# Patient Record
Sex: Female | Born: 1950 | Race: White | Hispanic: No | Marital: Married | Smoking: Former smoker
Health system: Southern US, Community
[De-identification: ages and names within clinical notes are randomized; demographics above are authoritative.]

## PROBLEM LIST (undated history)

## (undated) DIAGNOSIS — J9611 Chronic respiratory failure with hypoxia: Secondary | ICD-10-CM

## (undated) DIAGNOSIS — F29 Unspecified psychosis not due to a substance or known physiological condition: Secondary | ICD-10-CM

## (undated) DIAGNOSIS — E785 Hyperlipidemia, unspecified: Secondary | ICD-10-CM

## (undated) DIAGNOSIS — I10 Essential (primary) hypertension: Secondary | ICD-10-CM

## (undated) DIAGNOSIS — E119 Type 2 diabetes mellitus without complications: Secondary | ICD-10-CM

## (undated) DIAGNOSIS — J9612 Chronic respiratory failure with hypercapnia: Secondary | ICD-10-CM

## (undated) DIAGNOSIS — R9389 Abnormal findings on diagnostic imaging of other specified body structures: Secondary | ICD-10-CM

## (undated) DIAGNOSIS — K439 Ventral hernia without obstruction or gangrene: Secondary | ICD-10-CM

## (undated) DIAGNOSIS — I739 Peripheral vascular disease, unspecified: Secondary | ICD-10-CM

## (undated) DIAGNOSIS — F329 Major depressive disorder, single episode, unspecified: Secondary | ICD-10-CM

## (undated) DIAGNOSIS — F32A Depression, unspecified: Secondary | ICD-10-CM

## (undated) DIAGNOSIS — E662 Morbid (severe) obesity with alveolar hypoventilation: Secondary | ICD-10-CM

## (undated) DIAGNOSIS — I4891 Unspecified atrial fibrillation: Secondary | ICD-10-CM

## (undated) HISTORY — PX: DILATION AND CURETTAGE OF UTERUS: SHX78

## (undated) HISTORY — PX: COLON SURGERY: SHX602

## (undated) HISTORY — DX: Abnormal findings on diagnostic imaging of other specified body structures: R93.89

## (undated) HISTORY — PX: TIBIA FRACTURE SURGERY: SHX806

## (undated) HISTORY — PX: HERNIA REPAIR: SHX51

---

## 2004-06-15 ENCOUNTER — Other Ambulatory Visit: Payer: Self-pay

## 2006-05-18 ENCOUNTER — Inpatient Hospital Stay: Payer: Self-pay | Admitting: Unknown Physician Specialty

## 2006-07-21 ENCOUNTER — Other Ambulatory Visit: Payer: Self-pay

## 2006-07-21 ENCOUNTER — Inpatient Hospital Stay: Payer: Self-pay | Admitting: Internal Medicine

## 2006-07-23 ENCOUNTER — Inpatient Hospital Stay: Payer: Self-pay | Admitting: Unknown Physician Specialty

## 2010-07-19 ENCOUNTER — Inpatient Hospital Stay: Payer: Self-pay | Admitting: Internal Medicine

## 2010-09-15 ENCOUNTER — Other Ambulatory Visit: Payer: Self-pay | Admitting: Family Medicine

## 2010-09-16 ENCOUNTER — Inpatient Hospital Stay: Payer: Self-pay | Admitting: Internal Medicine

## 2011-04-03 ENCOUNTER — Encounter: Payer: Self-pay | Admitting: Cardiothoracic Surgery

## 2011-04-03 ENCOUNTER — Encounter: Payer: Self-pay | Admitting: Nurse Practitioner

## 2011-04-07 ENCOUNTER — Encounter: Payer: Self-pay | Admitting: Cardiothoracic Surgery

## 2011-04-07 ENCOUNTER — Encounter: Payer: Self-pay | Admitting: Nurse Practitioner

## 2011-05-08 ENCOUNTER — Encounter: Payer: Self-pay | Admitting: Nurse Practitioner

## 2011-05-08 ENCOUNTER — Encounter: Payer: Self-pay | Admitting: Cardiothoracic Surgery

## 2011-06-08 ENCOUNTER — Encounter: Payer: Self-pay | Admitting: Nurse Practitioner

## 2011-06-08 ENCOUNTER — Encounter: Payer: Self-pay | Admitting: Cardiothoracic Surgery

## 2011-07-08 ENCOUNTER — Encounter: Payer: Self-pay | Admitting: Nurse Practitioner

## 2011-07-08 ENCOUNTER — Encounter: Payer: Self-pay | Admitting: Cardiothoracic Surgery

## 2011-08-08 ENCOUNTER — Encounter: Payer: Self-pay | Admitting: Nurse Practitioner

## 2011-08-08 ENCOUNTER — Encounter: Payer: Self-pay | Admitting: Cardiothoracic Surgery

## 2011-09-07 ENCOUNTER — Encounter: Payer: Self-pay | Admitting: Cardiothoracic Surgery

## 2011-09-07 ENCOUNTER — Encounter: Payer: Self-pay | Admitting: Nurse Practitioner

## 2011-10-08 ENCOUNTER — Encounter: Payer: Self-pay | Admitting: Cardiothoracic Surgery

## 2011-10-08 ENCOUNTER — Encounter: Payer: Self-pay | Admitting: Nurse Practitioner

## 2011-11-08 ENCOUNTER — Encounter: Payer: Self-pay | Admitting: Nurse Practitioner

## 2011-11-08 ENCOUNTER — Encounter: Payer: Self-pay | Admitting: Cardiothoracic Surgery

## 2011-11-18 LAB — WOUND CULTURE

## 2011-12-06 ENCOUNTER — Encounter: Payer: Self-pay | Admitting: Nurse Practitioner

## 2011-12-06 ENCOUNTER — Encounter: Payer: Self-pay | Admitting: Cardiothoracic Surgery

## 2012-01-03 ENCOUNTER — Other Ambulatory Visit: Payer: Self-pay | Admitting: Nurse Practitioner

## 2012-01-03 LAB — CBC WITH DIFFERENTIAL/PLATELET
Basophil #: 0 10*3/uL (ref 0.0–0.1)
Eosinophil #: 0.2 10*3/uL (ref 0.0–0.7)
Eosinophil %: 2.4 %
HCT: 39.2 % (ref 35.0–47.0)
HGB: 13 g/dL (ref 12.0–16.0)
Lymphocyte %: 16 %
MCH: 30 pg (ref 26.0–34.0)
MCHC: 33.2 g/dL (ref 32.0–36.0)
MCV: 90 fL (ref 80–100)
Monocyte #: 0.6 10*3/uL (ref 0.0–0.7)
Neutrophil #: 7 10*3/uL — ABNORMAL HIGH (ref 1.4–6.5)
Platelet: 192 10*3/uL (ref 150–440)
RBC: 4.34 10*6/uL (ref 3.80–5.20)
RDW: 14.5 % (ref 11.5–14.5)
WBC: 9.4 10*3/uL (ref 3.6–11.0)

## 2012-01-03 LAB — HEMOGLOBIN A1C: Hemoglobin A1C: 7.8 % — ABNORMAL HIGH (ref 4.2–6.3)

## 2012-01-06 ENCOUNTER — Encounter: Payer: Self-pay | Admitting: Nurse Practitioner

## 2012-01-06 ENCOUNTER — Encounter: Payer: Self-pay | Admitting: Cardiothoracic Surgery

## 2012-01-07 LAB — WOUND CULTURE

## 2013-05-05 ENCOUNTER — Inpatient Hospital Stay: Payer: Self-pay | Admitting: Internal Medicine

## 2013-05-05 LAB — COMPREHENSIVE METABOLIC PANEL
Albumin: 3.1 g/dL — ABNORMAL LOW (ref 3.4–5.0)
Alkaline Phosphatase: 119 U/L (ref 50–136)
Anion Gap: 5 — ABNORMAL LOW (ref 7–16)
BUN: 13 mg/dL (ref 7–18)
Bilirubin,Total: 0.3 mg/dL (ref 0.2–1.0)
Calcium, Total: 9.1 mg/dL (ref 8.5–10.1)
Chloride: 103 mmol/L (ref 98–107)
Co2: 29 mmol/L (ref 21–32)
Creatinine: 1.05 mg/dL (ref 0.60–1.30)
EGFR (African American): >60
EGFR (Non-African Amer.): 57 — ABNORMAL LOW
Glucose: 254 mg/dL — ABNORMAL HIGH (ref 65–99)
Osmolality: 283 (ref 275–301)
Potassium: 4.1 mmol/L (ref 3.5–5.1)
SGOT(AST): 18 U/L (ref 15–37)
SGPT (ALT): 23 U/L (ref 12–78)
Sodium: 137 mmol/L (ref 136–145)
Total Protein: 7 g/dL (ref 6.4–8.2)

## 2013-05-05 LAB — CBC
HCT: 39.1 % (ref 35.0–47.0)
HGB: 13.5 g/dL (ref 12.0–16.0)
MCH: 31.2 pg (ref 26.0–34.0)
MCHC: 34.4 g/dL (ref 32.0–36.0)
MCV: 91 fL (ref 80–100)
Platelet: 184 10*3/uL (ref 150–440)
RBC: 4.31 10*6/uL (ref 3.80–5.20)
RDW: 14.2 % (ref 11.5–14.5)
WBC: 9.5 10*3/uL (ref 3.6–11.0)

## 2013-05-07 LAB — CBC WITH DIFFERENTIAL/PLATELET
Basophil #: 0.1 10*3/uL (ref 0.0–0.1)
Basophil %: 0.6 %
Eosinophil %: 5.8 %
HCT: 41.3 % (ref 35.0–47.0)
HGB: 14.3 g/dL (ref 12.0–16.0)
Lymphocyte %: 17.7 %
MCH: 31.3 pg (ref 26.0–34.0)
MCV: 91 fL (ref 80–100)
Monocyte %: 6 %
Neutrophil #: 7.5 10*3/uL — ABNORMAL HIGH (ref 1.4–6.5)
Neutrophil %: 69.9 %
Platelet: 189 10*3/uL (ref 150–440)
RBC: 4.56 10*6/uL (ref 3.80–5.20)
WBC: 10.8 10*3/uL (ref 3.6–11.0)

## 2013-05-07 LAB — BASIC METABOLIC PANEL
Calcium, Total: 9.6 mg/dL (ref 8.5–10.1)
Chloride: 103 mmol/L (ref 98–107)
Creatinine: 1.02 mg/dL (ref 0.60–1.30)
EGFR (African American): >60
Glucose: 163 mg/dL — ABNORMAL HIGH (ref 65–99)
Osmolality: 277 (ref 275–301)
Potassium: 4.2 mmol/L (ref 3.5–5.1)

## 2013-05-07 LAB — SEDIMENTATION RATE: Erythrocyte Sed Rate: 28 mm/hr (ref 0–30)

## 2013-05-08 LAB — BASIC METABOLIC PANEL
BUN: 13 mg/dL (ref 7–18)
Chloride: 102 mmol/L (ref 98–107)
Creatinine: 1.07 mg/dL (ref 0.60–1.30)
Glucose: 198 mg/dL — ABNORMAL HIGH (ref 65–99)
Potassium: 4.4 mmol/L (ref 3.5–5.1)
Sodium: 138 mmol/L (ref 136–145)

## 2013-12-14 ENCOUNTER — Ambulatory Visit: Payer: Self-pay | Admitting: Internal Medicine

## 2014-01-03 ENCOUNTER — Ambulatory Visit: Payer: Self-pay | Admitting: Internal Medicine

## 2014-07-08 ENCOUNTER — Ambulatory Visit: Payer: Self-pay | Admitting: Internal Medicine

## 2014-11-15 ENCOUNTER — Ambulatory Visit: Payer: Self-pay | Admitting: Obstetrics and Gynecology

## 2014-11-25 ENCOUNTER — Ambulatory Visit: Payer: Self-pay | Admitting: Obstetrics and Gynecology

## 2015-01-27 NOTE — Consult Note (Signed)
Brief Consult Note: Diagnosis: Infection left thumb.   Patient was seen by consultant.   Recommend further assessment or treatment.   Orders entered.   Comments: 64 year old diabetic female cut tip of left thumb a week ago and developed reddness and swelling of the thumb.  Admitted 05/05/13 for cellulitis of the thumb and hand.  Started on Unisyn and Vancomycin.  No fever or chills.  white blood count normal.  Better today after 24 hrs of antibiotics.    Exam:  No warmth or reddness of significance.  Swelling and bruising present but not much pain.  Slight numbness distally.  IP joint flexion and extension normal.  Vesicular changes on several fingers and in 1st web.  No hand or wrist reddness.   Imp:  resolving cellulitis left thumb.    Rx:  continue antibiotics         warm saline soaks q6h        no indication for surgery at this time        large blister decompressed 1st web space.  Electronic Signatures: Valinda HoarMiller, Beverlyn Mcginness E (MD)  (Signed 31-Jul-14 23:14)  Authored: Brief Consult Note   Last Updated: 31-Jul-14 23:14 by Valinda HoarMiller, Mehki Klumpp E (MD)

## 2015-01-27 NOTE — Consult Note (Signed)
PATIENT NAME:  Jasmine Osborn, OMAR MR#:  [id number] DATE OF BIRTH:  1951/08/14  INFECTIOUS DISEASE CONSULTATION REPORT  DATE OF CONSULTATION:  05/06/2013  REFERRING PHYSICIAN:  Einar Crow, MD  CONSULTING PHYSICIAN:  Stann Mainland. Sampson Goon, MD  REASON FOR CONSULTATION: Left hand swelling and redness.  HISTORY OF PRESENT ILLNESS: A 64 year old female with a history of diabetes,  controlled, as well as morbid obesity. She reports that several weeks ago, she cut her thumb when cutting turnips. She states it was not doing too bad until about one week ago when it started to get swollen and red. She then developed blisters on her hand. She has had no fever or chills with this. She did not have any contact with animals or any gardening or other exposures. She has never had an infection like this before. She denies any recent cold sores or herpes outbreaks.   The patient was started on vancomycin on admission and is now also on Unasyn. She says she has had some slight improvement since admission.   PAST MEDICAL HISTORY:  1.  Morbid obesity.  2.  Diabetes.  3.  Hypertension.  4.  Hepatitis C.  5.  Cholelithiasis.  6.  Schizophrenia.   PAST SURGICAL HISTORY: Ventral hernia repair.   SOCIAL HISTORY: The patient does not smoke. She lives with her husband. No alcohol or drug abuse. She has not gardened, had any exposure to animals or travel recently.   FAMILY HISTORY: Her mother had hypertension and Alzheimer's. Father had a history of heart problems.   CURRENT MEDICATIONS: The patient has been started on vancomycin and Unasyn.   OUTPATIENT MEDICATIONS: Include Risperdal, Remeron, metoprolol, iron, amlodipine.   ALLERGIES: No known drug allergies.   PHYSICAL EXAMINATION:  GENERAL: Morbidly obese, sitting in a chair.  VITAL SIGNS: Temperature since admission 98.3. Pulse is 98. Blood pressure 120/70, saturations 97% on room air.  HEENT: Pupils equal, round, and reactive to light and  accommodation. Extraocular movements are intact. Sclerae are anicteric. Oropharynx clear.  NECK: Supple.  HEART: Regular.  LUNGS: Clear to auscultation.  ABDOMEN: Morbidly obese.  EXTREMITIES: On her left thumb, she has marked swelling as well as a dry, dark, thickened skin over her anterior thumb. The rest of her hand is quite swollen and mildly red. She has bullae over her fingers which are also swollen on the left hand. There is no streaking. There are no nodules up the arm and no lymphadenopathy noted.   DATA: On admission, the patient's white blood count is 9.5, hemoglobin 13.5, platelets 184. Basic metabolic panel and comprehensive panel and LFTs are normal except for sugar is elevated at 254. Creatinine is 1.05. No imaging is available.   IMPRESSION: A 64 year old with morbid obesity, hypertension, diabetes and hepatitis C with cellulitis over her left thumb. This is somewhat atypical appearing. She does have vesicles which does raise the possibility of herpetic whitlow, although she has no exposure history to correlate with it. She seems to be relatively stable with the vancomycin, Unasyn, and the acyclovir she is on.   RECOMMENDATIONS:  1.  I have ordered a herpetic swab of the lesion to check for herpes.   2.  Continue current antibiotics.  3.  Recommended elevation of the arm to help with the swelling. At this point, I do not think surgical intervention is needed; however, if it worsens, I would certainly consult surgery.  4.  There is a possibility of an usual vasculitis with her hepatitis C history; for  now, though, will continue on her current antibiotics.  5.  Thank you for the consult. I will be glad to follow with you    ____________________________ Stann Mainlandavid P. Sampson GoonFitzgerald, MD dpf:np D: 05/06/2013 16:39:03 ET T: 05/06/2013 17:52:11 ET JOB#: [id number]  cc: Stann Mainlandavid P. Sampson GoonFitzgerald, MD, <Dictator> Ivory Maduro Sampson GoonFITZGERALD MD ELECTRONICALLY SIGNED 05/08/2013 20:37

## 2015-01-27 NOTE — H&P (Signed)
PATIENT NAME:  Jasmine Osborn, [name] MR#:  [id number] DATE OF BIRTH:  12/08/1950  DATE OF ADMISSION:  05/05/2013  PRIMARY CARE PHYSICIAN:  Dr. Einar CrowMarshall Anderson.   REFERRING PHYSICIAN:  Dr. Bayard Malesandolph Brown.   CHIEF COMPLAINT:  Left hand swelling and redness.   HISTORY OF PRESENT ILLNESS:  Jasmine Osborn is a 64 year old Caucasian female with a history of diabetes mellitus type 2 and morbid obesity.  She was in her usual state of health until about a week or two weeks while she was cutting vegetables she accidentally cut her left hand.  Since that time she developed redness which got worse with time, associated with swelling of the left hand.  Finally, she decided to come to the Emergency Department today.  The patient is being now admitted to the hospital with left hand cellulitis.  Evaluation at the Emergency Department was also concerned about some vesicles over the left hand fingers, the second, third and fourth raising question whether there is herpetic lesions affecting the hand as well.   REVIEW OF SYSTEMS:  CONSTITUTIONAL:  Denies any fever.  No chills.  No fatigue.  EYES:  No blurring of vision.  No double vision.  EARS, NOSE, THROAT:  No hearing impairment.  No sore throat.  No dysphagia.  CARDIOVASCULAR:  No chest pain.  No shortness of breath.  No syncope.  RESPIRATORY:  No cough.  No shortness of breath.  No chest pain.  GASTROINTESTINAL:  No abdominal pain, no vomiting.  No diarrhea.  GENITOURINARY:  No dysuria.  No frequency of urination.  MUSCULOSKELETAL:  No joint swelling.  No clubbing.  INTEGUMENTARY:  Apart from the left hand swelling and redness, there is no skin rash elsewhere.  No ulcers.  NEUROLOGY:  No focal weakness.  No seizure activity.  No headache.  PSYCHIATRY:  No anxiety or depression, but she has history of schizophrenia.  ENDOCRINE:  No polyuria or polydipsia.  No heat or cold intolerance.   PAST MEDICAL HISTORY:  Diabetes mellitus type 2 uncontrolled, systemic  hypertension, morbid obesity, history of hepatitis C virus infection, cholelithiasis, schizophrenia.   PAST SURGICAL HISTORY:  Ventral hernia repair with using synthetic mesh, however this had failed and patient developed another hernia, now it is inoperable.   SOCIAL HABITS:  Nonsmoker.  No history of alcohol or drug abuse.   SOCIAL HISTORY:  She is single, unemployed.   FAMILY HISTORY:  Her mother suffered from hypertension and Alzheimer dementia.  She is deceased now.  Her father also deceased, but prior to his death he suffered from some kind of heart problems that she could not specify.    ADMISSION MEDICATIONS:  Risperdal 2 mg once a day, multivitamin once a day, mirtazapine 30 mg once a day, metoprolol succinate 50 mg once a day, ferrous sulfate 325 mg twice a day, amlodipine 5 mg a day.   ALLERGIES:  No known drug allergies.   PHYSICAL EXAMINATION: VITAL SIGNS:  Blood pressure 166/91, respiratory rate 22, pulse 100, temperature 98.3.  Oxygen saturation 94% on room air.  GENERAL APPEARANCE:  Elderly female, morbidly obese, lying in bed in no acute distress.  HEAD AND NECK:  No pallor.  No icterus.  No cyanosis.  Ear examination revealed normal hearing, no discharge, no lesions.  Examination of the nose showed no lesions, no ulcers, no discharge.  Oropharyngeal examination revealed normal lips and tongue, no oral thrush, no ulcers.  Eye examination revealed normal eyelids and conjunctivae.  Pupils about 5 mm, round,  equal and reactive to light.  NECK:  Supple.  Trachea at midline.  No thyromegaly.  No cervical lymphadenopathy.  No masses.  HEART:  Revealed normal S1, S2.  No S3, S4.  No murmur.  No gallop.  No carotid bruits.  RESPIRATORY:  Revealed normal breathing pattern without use of accessory muscles.  No rales.  No wheezing.  ABDOMEN:  Morbidly obese, soft without tenderness.  No hepatosplenomegaly.  No masses.  She has a protruding reducible ventral hernia at the lower mid abdomen.   Nontender.  SKIN:  Revealed redness and discoloration of the left hand finger along with mild erythema.  There is also mild erythema over the palm of the left hand as well associated with soft tissue swelling.  There are multiple vesicles affecting the second, third and fourth fingers.  MUSCULOSKELETAL:  No joint swelling.  No clubbing.  NEUROLOGIC:  Cranial nerves II through XII are intact.  No focal motor deficit.  PSYCHIATRY:  The patient is alert and oriented x 3.  Mood and affect were normal.   LABORATORY FINDINGS:  Her serum glucose was 254, BUN 13, creatinine 1, sodium 137, potassium 4.1.  Normal liver function tests and liver transaminases.  Albumin was slightly low at 3.1.  CBC showed white count of 9000, hemoglobin 13, hematocrit 39, platelet count 184.   ASSESSMENT: 1.  Left hand cellulitis.  2.  Vesicles on the left hand fingers, question whether this is herpetic lesions versus contact dermatitis.  3.  Diabetes mellitus, type 2, uncontrolled.  4.  Systemic hypertension.  5.  Morbid obesity.  6.  Schizophrenia.  7.  History of cholelithiasis and hepatitis C virus infection.   PLAN:  The patient was started on intravenous vancomycin.  I will continue the same.  Oral acyclovir or Zovirax started, 800 mg 5 times a day.  I will check serum for herpes simplex type I and type II, IgG and IgM.  If this is negative, then acyclovir has to be discontinued.  I will give the patient a booster dose for tetanus.  She does not remember the last time she had tetanus, but it is many years ago.  We will place her on Accu-Chek and sliding scale to control blood sugar.  I will start her on metformin 500 mg a day.  For deep vein thrombosis prophylaxis, we will place her on subcutaneous Lovenox 40 mg a day.   Time spent in evaluating this patient took more than 55 minutes.     ____________________________ Carney Corners. Rudene Re, MD amd:ea D: 05/06/2013 00:12:49 ET T: 05/06/2013 00:50:12  ET JOB#: [id number]  cc: Carney Corners. Rudene Re, MD, <Dictator> Zollie Scale MD ELECTRONICALLY SIGNED 05/06/2013 5:42

## 2015-01-27 NOTE — Discharge Summary (Signed)
PATIENT NAME:  Jasmine Osborn, Jasmine Osborn MR#:  [id number] DATE OF BIRTH:  10-10-1950  DATE OF ADMISSION:  05/05/2013 DATE OF DISCHARGE:  05/08/2013  FINAL DIAGNOSES:  1. Left hand cellulitis.  2. Diabetes mellitus, uncontrolled.  3. Hypertension.  4. Schizophrenia.  5. Morbid obesity.  6. History of hepatitis C viral infection.   HISTORY AND PHYSICAL: Please see dictated admission history and physical.   HOSPITAL COURSE: The patient was admitted with left hand swelling and pain, with evidence of cellulitis. There was a blistering rash on the hand as well which initially was concerning for herpes simplex versus zoster. She had been placed on isolation, was initially started on vancomycin, Zosyn and Zovirax. Infectious disease saw the patient. Viral culture was obtained from the blisters, which was negative for herpes simplex. The rash improved and appeared to be more drying up rather than fading away and did not appear to be vesicular on further evaluation. The erythema slowly improved. Her antibiotic regimen was changed over to Augmentin and doxycycline, and she was observed for another 24 hours. She remained afebrile on this, without evidence of worsening infection and felt well. At this time, she will be discharged to home in stable condition with her physical activity to be up as tolerated. Her diet will be 2 grams sodium, 1800 calorie ADA. She should check blood sugars daily and record this. We will anticipate her following up with Dr. Sampson GoonFitzgerald in 1 to 2 weeks to re-evaluate the hand and then follow up with Dr. Dareen PianoAnderson as previously scheduled.   DISCHARGE MEDICATIONS:  1. Mirtazapine 30 mg p.Osborn. at bedtime. 2. Multivitamin 1 p.Osborn. daily.  3. Risperdal 2 mg p.Osborn. at bedtime.  4. Amlodipine 5 mg p.Osborn. daily.  5. Toprol-XL 50 mg p.Osborn. daily.  6. Metformin 500 mg p.Osborn. b.i.d.  7. Augmentin 875 mg p.Osborn. b.i.d. for 10 days.  8. Doxycycline 100 mg p.Osborn. b.i.d. for 10 days.   At this time, she may stop  iron as her hemoglobin levels have returned to normal from previous anemia.   ____________________________ Lynnea FerrierBert J. Klein III, MD bjk:OSi D: 05/08/2013 12:27:35 ET T: 05/08/2013 14:16:52 ET JOB#: [id number]  cc: Lynnea FerrierBert J. Klein III, MD, <Dictator> Marya AmslerMarshall W. Dareen PianoAnderson, MD Stann Mainlandavid P. Sampson GoonFitzgerald, MD Daniel NonesBERT KLEIN MD ELECTRONICALLY SIGNED 05/10/2013 7:54

## 2015-01-30 LAB — SURGICAL PATHOLOGY

## 2015-02-05 NOTE — Op Note (Signed)
PATIENT NAME:  Jasmine Osborn, Jasmine Osborn MR#:  [id number] DATE OF BIRTH:  1951-02-02  DATE OF PROCEDURE:  11/25/2014  PREOPERATIVE DIAGNOSES: 1.  Postmenopausal bleeding.  2.  Endometrial hyperplasia (simple, non-atypical).   POSTOPERATIVE DIAGNOSES:  1.  Postmenopausal bleeding.  2.  Endometrial hyperplasia (simple, non-atypical).   PROCEDURES PERFORMED: 1.  Dilation and curettage.  2.  Placement of Mirena IUD.   SURGEON: Jolinda Croakavid Caralynn Gelber, MD  ASSISTANT: None.  TYPE OF ANESTHESIA: General endotracheal.  ESTIMATED BLOOD LOSS: Minimal.   URINE OUTPUT: 100 mL at the start of the case.   IV FLUID: Please see anesthesia record.   FINDINGS: An 8 cm uterus identified with uterine sound. Ms. Jasmine Osborn had a very difficult cervix to visualize due to body habitus as well as vaginal laxity. In the office, the cervix had been visualized with a long Graves speculum. Multiple other methods were used in the operating room using the equipment available, but adequate cervical access was not obtained. Once a long Graves speculum was identified then the cervix was able to be visualized. Would recommend ensuring access to an adequate speculum prior to any future procedures.   DESCRIPTION OF PROCEDURE: After obtaining appropriate consent, the patient was taken to the operating room where she was placed under general anesthesia. She was then prepped and draped in the normal sterile fashion, in the dorsal lithotomy position, in candy cane stirrups. A timeout taken to ensure the correct patient and the correct procedure and that prophylactic antibiotics were not necessary and her allergies were identified.   The cervix was eventually identified using a long Graves speculum and grasped using a single-tooth tenaculum. The uterine sound was placed in the uterus, measured 8 cm on sounding. The cervix was sequentially dilated up to 12-French diameter with Shawnie PonsPratt dilators. Sharp curettage was then performed with an adequate  sample sent for analysis. At this point, given approximately 45 minutes of difficulty trying to access the cervix with the proper retractors, it was not felt that an adequate angle could be achieved to perform hysteroscopy in a safe manner.   A Mirena IUD was placed in the routine fashion without complication. The lot number is [id number]. The strings were cut to approximately 3 cm in length. All instruments were removed from the vagina. Vaginal Premarin cream was placed around the introitus and lower vagina to help with pain from abrasions from the multiple manipulations.   The procedure was terminated. Ms. Jasmine Osborn was awakened from anesthesia without complication and transferred to the PACU. She will be discharged home with instructions and prescriptions already provided. Plan for 2 week follow up.   ____________________________ Teena Iraniavid M. Derrill KayGoodman, MD :Terrance Masssb D: 11/25/2014 13:01:04 ET T: 11/25/2014 13:53:16 ET JOB#: [id number]  cc: Teena Iraniavid M. Derrill KayGoodman, MD, <Dictator> Ula Couvillon Farrel ConnersM Lakenya Riendeau MD ELECTRONICALLY SIGNED 11/30/2014 20:20

## 2015-10-18 ENCOUNTER — Other Ambulatory Visit
Admission: RE | Admit: 2015-10-18 | Discharge: 2015-10-18 | Disposition: A | Discharge status: Home / Self Care | Payer: Managed Care, Other (non HMO) | Source: Ambulatory Visit | Attending: Podiatry | Admitting: Podiatry

## 2015-10-18 DIAGNOSIS — L97522 Non-pressure chronic ulcer of other part of left foot with fat layer exposed: Secondary | ICD-10-CM | POA: Insufficient documentation

## 2015-10-22 LAB — WOUND CULTURE

## 2015-10-24 LAB — ANAEROBIC CULTURE

## 2015-12-11 ENCOUNTER — Encounter
Admission: RE | Admit: 2015-12-11 | Discharge: 2015-12-11 | Disposition: A | Discharge status: Home / Self Care | Payer: Managed Care, Other (non HMO) | Source: Ambulatory Visit | Attending: Obstetrics and Gynecology | Admitting: Obstetrics and Gynecology

## 2015-12-11 DIAGNOSIS — Z01812 Encounter for preprocedural laboratory examination: Secondary | ICD-10-CM | POA: Diagnosis present

## 2015-12-11 DIAGNOSIS — Z01810 Encounter for preprocedural cardiovascular examination: Secondary | ICD-10-CM | POA: Diagnosis not present

## 2015-12-11 HISTORY — DX: Ventral hernia without obstruction or gangrene: K43.9

## 2015-12-11 HISTORY — DX: Type 2 diabetes mellitus without complications: E11.9

## 2015-12-11 HISTORY — DX: Major depressive disorder, single episode, unspecified: F32.9

## 2015-12-11 HISTORY — DX: Essential (primary) hypertension: I10

## 2015-12-11 HISTORY — DX: Depression, unspecified: F32.A

## 2015-12-11 HISTORY — DX: Peripheral vascular disease, unspecified: I73.9

## 2015-12-11 HISTORY — DX: Unspecified psychosis not due to a substance or known physiological condition: F29

## 2015-12-11 HISTORY — DX: Hyperlipidemia, unspecified: E78.5

## 2015-12-11 LAB — CBC
HEMATOCRIT: 39.3 % (ref 35.0–47.0)
Hemoglobin: 12.8 g/dL (ref 12.0–16.0)
MCH: 27.6 pg (ref 26.0–34.0)
MCHC: 32.6 g/dL (ref 32.0–36.0)
MCV: 84.9 fL (ref 80.0–100.0)
Platelets: 178 10*3/uL (ref 150–440)
RBC: 4.63 MIL/uL (ref 3.80–5.20)
RDW: 16.2 % — AB (ref 11.5–14.5)
WBC: 7 10*3/uL (ref 3.6–11.0)

## 2015-12-11 LAB — BASIC METABOLIC PANEL
ANION GAP: 8 (ref 5–15)
BUN: 17 mg/dL (ref 6–20)
CALCIUM: 9.5 mg/dL (ref 8.9–10.3)
CO2: 28 mmol/L (ref 22–32)
CREATININE: 1.04 mg/dL — AB (ref 0.44–1.00)
Chloride: 103 mmol/L (ref 101–111)
GFR calc Af Amer: >60 mL/min (ref >60)
GFR calc non Af Amer: 55 mL/min — ABNORMAL LOW (ref >60)
GLUCOSE: 171 mg/dL — AB (ref 65–99)
Potassium: 4.4 mmol/L (ref 3.5–5.1)
Sodium: 139 mmol/L (ref 135–145)

## 2015-12-11 LAB — TYPE AND SCREEN
ABO/RH(D): O POS
Antibody Screen: NEGATIVE

## 2015-12-11 LAB — ABO/RH: ABO/RH(D): O POS

## 2015-12-11 NOTE — H&P (Signed)
Ms. Jasmine Osborn is a 65 y.o. female here for Pre Op Consulting .  HPI:  Pt presents for a preoperative visit to schedule a D&C, hysteroscopy, and location of IUD.   She has a hx of: Simple hyperplasia without atypia [facility] and treated with Mirena IUD. Unable to find cervix despite positioning and multpile providers trying.   Past Medical History:  has a past medical history of Anemia, unspecified; Candidal diaper rash; Diabetes mellitus type 2, uncomplicated (CMS-HCC); Hypertension; MRSA (methicillin resistant staph aureus) culture positive; Schizophrenia (CMS-HCC); and Sepsis (CMS-HCC) (07/2010).  Past Surgical History:  has a past surgical history that includes Hernia repair (2003); Hernia repair (10-5); SMALL BOWEL REPAIR (10-5); ANKLE SURGERY (1996); and Dilation and curettage of uterus. Family History: family history includes Alzheimer's disease in her mother; Heart disease in her father; Hypertension in her mother. Social History:  reports that she has never smoked. She has never used smokeless tobacco. She reports that she does not drink alcohol or use illicit drugs. OB/GYN History:  OB History    Gravida Para Term Preterm AB TAB SAB Ectopic Multiple Living   2 2 2       2       Allergies: has No Known Allergies. Medications:  Current Outpatient Prescriptions:  . amLODIPine (NORVASC) 5 MG tablet, TAKE 1 TABLET (5 MG TOTAL) BY MOUTH ONCE DAILY., Disp: 90 tablet, Rfl: 1 . aspirin 81 MG EC tablet, Take 81 mg by mouth once daily., Disp: , Rfl:  . fenofibrate 160 MG tablet, TAKE 1 TABLET BY MOUTH ONCE A DAY, Disp: 90 tablet, Rfl: 4 . glimepiride (AMARYL) 2 MG tablet, Take 0.5 tablets (1 mg total) by mouth daily with breakfast., Disp: 90 tablet, Rfl: 3 . KLOR-CON 10 10 mEq ER tablet, TAKE 2 TABLET BY MOUTH ONCE A DAY, Disp: 180 tablet, Rfl: 3 . latanoprost (XALATAN) 0.005 % ophthalmic solution, USE 1 DROP IN EACH EYE AT BEDTIME, Disp: , Rfl: 3 . losartan  (COZAAR) 50 MG tablet, TAKE 1 TABLET BY MOUTH ONCE A DAY, Disp: 90 tablet, Rfl: 1 . metFORMIN (GLUCOPHAGE) 500 MG tablet, TAKE 1 TABLET (500 MG TOTAL) BY MOUTH 2 (TWO) TIMES DAILY WITH MEALS., Disp: 180 tablet, Rfl: 1 . metoprolol succinate (TOPROL-XL) 50 MG XL tablet, TAKE 1 TABLET BY MOUTH ONCE A DAY, Disp: 90 tablet, Rfl: 4 . mirtazapine (REMERON) 30 MG tablet, TAKE 1 TABLET BY MOUTH AT BEDTIME, Disp: 90 tablet, Rfl: 3 . risperiDONE (RISPERDAL) 1 MG tablet, TAKE 1 AND 1/2 TABLETS AT BEDTIME, Disp: 135 tablet, Rfl: 1 . TORsemide (DEMADEX) 5 MG tablet, TAKE 1 TABLET BY MOUTH ONCE A DAY, Disp: 90 tablet, Rfl: 3  Review of Systems: See HPI   Exam:      Vitals:   11/30/15 1621  BP: 138/83  Pulse: 99    WDWN white  female in NAD Body mass index is 47.31 kg/(m^2). Lungs: CTA  CV : RRR without murmur  HEENT: Thick neck, trachea midline Breast: exam done in sitting and lying position : No dimpling or retraction, no dominant mass, no spontaneous discharge, no axillary adenopathy Neck: no thyromegaly Abdomen: soft , no mass, normal active bowel sounds, non-tender, no rebound tenderness  Pelvic: Deferred  Impression:   The primary encounter diagnosis was Simple endometrial hyperplasia without atypia. A diagnosis of Morbid obesity with BMI of 45.0-49.9, adult (CMS-HCC) was also pertinent to this visit.    Plan:   - Preoperative visit: D&C hysteroscopy, IUD location. If  not seen in uterus, will plan on Xray to locate. Consents signed today. Risks of surgery were discussed with the patient including but not limited to: bleeding which may require transfusion; infection which may require antibiotics; injury to uterus or surrounding organs; intrauterine scarring which may impair future fertility; need for additional procedures including laparotomy or laparoscopy; and other postoperative/anesthesia complications. Written informed consent was obtained.  This is a scheduled  same-day surgery. She will have a postop visit in 2 weeks to review operative findings and pathology.

## 2015-12-11 NOTE — Pre-Procedure Instructions (Addendum)
Called Dr Noralyn Pickarroll regarding abnormal EKG.Jeanene Erb. Called and faxed Dr Francena HanlyBeasley's and Dr Ewell PoeAnderson's office regarding need for clearance.  Called patient with appointment time of Mon 13th  At 2:30 pm

## 2015-12-11 NOTE — Patient Instructions (Signed)
  Your procedure is scheduled on: 11/24/15 Fri Report to Day Surgery.2nd floor medical mall To find out your arrival time please call [phone] between 1PM - 3PM on 11/23/15 Thurs.  Remember: Instructions that are not followed completely may result in serious medical risk, up to and including death, or upon the discretion of your surgeon and anesthesiologist your surgery may need to be rescheduled.    _x___ 1. Do not eat food or drink liquids after midnight. No gum chewing or hard candies.     ____ 2. No Alcohol for 24 hours before or after surgery.   ____ 3. Bring all medications with you on the day of surgery if instructed.    __x__ 4. Notify your doctor if there is any change in your medical condition     (cold, fever, infections).     Do not wear jewelry, make-up, hairpins, clips or nail polish.  Do not wear lotions, powders, or perfumes. You may wear deodorant.  Do not shave 48 hours prior to surgery. Men may shave face and neck.  Do not bring valuables to the hospital.    [facility] not responsible for any belongings or valuables.               Contacts, dentures or bridgework may not be worn into surgery.  Leave your suitcase in the car. After surgery it may be brought to your room.  For patients admitted to the hospital, discharge time is determined by your                treatment team.   Patients discharged the day of surgery will not be allowed to drive home.   Please read over the following fact sheets that you were given:      __x__ Take these medicines the morning of surgery with A SIP OF WATER:    1. amLODipine (NORVASC) 5 MG tablet  2. metoprolol succinate (TOPROL-XL) 50 MG 24 hr tablet  3.   4.  5.  6.  ____ Fleet Enema (as directed)   ____ Use CHG Soap as directed  ____ Use inhalers on the day of surgery  __x__ Stop metformin 2 days prior to surgery    ____ Take 1/2 of usual insulin dose the night before surgery and none on the morning of  surgery.   _x___ Stop Coumadin/Plavix/aspirin on stop aspirin 1 week before surgery  _x___ Stop Anti-inflammatories on T1 week before surgery OK to take tylenol as needed   ____ Stop supplements until after surgery.    ____ Bring C-Pap to the hospital.

## 2015-12-29 ENCOUNTER — Ambulatory Visit
Admission: RE | Admit: 2015-12-29 | Discharge: 2015-12-29 | Disposition: A | Discharge status: Home / Self Care | Payer: Managed Care, Other (non HMO) | Source: Ambulatory Visit | Attending: Obstetrics and Gynecology | Admitting: Obstetrics and Gynecology

## 2015-12-29 ENCOUNTER — Encounter
Admission: RE | Disposition: A | Discharge status: Home / Self Care | Payer: Self-pay | Source: Ambulatory Visit | Attending: Obstetrics and Gynecology

## 2015-12-29 ENCOUNTER — Ambulatory Visit: Payer: Managed Care, Other (non HMO) | Admitting: Anesthesiology

## 2015-12-29 ENCOUNTER — Encounter: Payer: Self-pay | Admitting: *Deleted

## 2015-12-29 DIAGNOSIS — N8501 Benign endometrial hyperplasia: Secondary | ICD-10-CM | POA: Diagnosis not present

## 2015-12-29 DIAGNOSIS — Z7982 Long term (current) use of aspirin: Secondary | ICD-10-CM | POA: Diagnosis not present

## 2015-12-29 DIAGNOSIS — R0602 Shortness of breath: Secondary | ICD-10-CM | POA: Insufficient documentation

## 2015-12-29 DIAGNOSIS — E119 Type 2 diabetes mellitus without complications: Secondary | ICD-10-CM | POA: Insufficient documentation

## 2015-12-29 DIAGNOSIS — Z6841 Body Mass Index (BMI) 40.0 and over, adult: Secondary | ICD-10-CM | POA: Diagnosis not present

## 2015-12-29 DIAGNOSIS — F209 Schizophrenia, unspecified: Secondary | ICD-10-CM | POA: Diagnosis not present

## 2015-12-29 DIAGNOSIS — N898 Other specified noninflammatory disorders of vagina: Secondary | ICD-10-CM | POA: Diagnosis not present

## 2015-12-29 DIAGNOSIS — Z8614 Personal history of Methicillin resistant Staphylococcus aureus infection: Secondary | ICD-10-CM | POA: Diagnosis not present

## 2015-12-29 DIAGNOSIS — Z7984 Long term (current) use of oral hypoglycemic drugs: Secondary | ICD-10-CM | POA: Diagnosis not present

## 2015-12-29 DIAGNOSIS — Z79899 Other long term (current) drug therapy: Secondary | ICD-10-CM | POA: Insufficient documentation

## 2015-12-29 DIAGNOSIS — N950 Postmenopausal bleeding: Secondary | ICD-10-CM | POA: Diagnosis present

## 2015-12-29 DIAGNOSIS — N8500 Endometrial hyperplasia, unspecified: Secondary | ICD-10-CM | POA: Diagnosis present

## 2015-12-29 DIAGNOSIS — I10 Essential (primary) hypertension: Secondary | ICD-10-CM | POA: Insufficient documentation

## 2015-12-29 LAB — CBC
HEMATOCRIT: 39.4 % (ref 35.0–47.0)
Hemoglobin: 12.9 g/dL (ref 12.0–16.0)
MCH: 28 pg (ref 26.0–34.0)
MCHC: 32.6 g/dL (ref 32.0–36.0)
MCV: 86 fL (ref 80.0–100.0)
Platelets: 168 10*3/uL (ref 150–440)
RBC: 4.59 MIL/uL (ref 3.80–5.20)
RDW: 16.7 % — ABNORMAL HIGH (ref 11.5–14.5)
WBC: 7.4 10*3/uL (ref 3.6–11.0)

## 2015-12-29 LAB — BASIC METABOLIC PANEL
ANION GAP: 6 (ref 5–15)
BUN: 17 mg/dL (ref 6–20)
CO2: 25 mmol/L (ref 22–32)
Calcium: 8.9 mg/dL (ref 8.9–10.3)
Chloride: 106 mmol/L (ref 101–111)
Creatinine, Ser: 1.08 mg/dL — ABNORMAL HIGH (ref 0.44–1.00)
GFR, EST NON AFRICAN AMERICAN: 53 mL/min — AB (ref >60)
GLUCOSE: 195 mg/dL — AB (ref 65–99)
POTASSIUM: 3.9 mmol/L (ref 3.5–5.1)
Sodium: 137 mmol/L (ref 135–145)

## 2015-12-29 LAB — GLUCOSE, CAPILLARY
GLUCOSE-CAPILLARY: 220 mg/dL — AB (ref 65–99)
GLUCOSE-CAPILLARY: 158 mg/dL — AB (ref 65–99)

## 2015-12-29 LAB — TYPE AND SCREEN
ABO/RH(D): O POS
ANTIBODY SCREEN: NEGATIVE

## 2015-12-29 SURGERY — EXAM UNDER ANESTHESIA
Anesthesia: General

## 2015-12-29 MED ORDER — ONDANSETRON HCL 4 MG/2ML IJ SOLN
INTRAMUSCULAR | Status: DC | PRN
  Administered 2015-12-29: 4 mg via INTRAVENOUS

## 2015-12-29 MED ORDER — GLYCOPYRROLATE 0.2 MG/ML IJ SOLN
INTRAMUSCULAR | Status: DC | PRN
  Administered 2015-12-29: 0.2 mg via INTRAVENOUS

## 2015-12-29 MED ORDER — SODIUM CHLORIDE 0.9 % IV SOLN
INTRAVENOUS | Status: DC
  Administered 2015-12-29: 09:00:00 via INTRAVENOUS

## 2015-12-29 MED ORDER — DEXAMETHASONE SODIUM PHOSPHATE 10 MG/ML IJ SOLN
INTRAMUSCULAR | Status: DC | PRN
  Administered 2015-12-29: 4 mg via INTRAVENOUS

## 2015-12-29 MED ORDER — PROPOFOL 10 MG/ML IV BOLUS
INTRAVENOUS | Status: DC | PRN
  Administered 2015-12-29: 200 mg via INTRAVENOUS

## 2015-12-29 MED ORDER — FENTANYL CITRATE (PF) 100 MCG/2ML IJ SOLN
INTRAMUSCULAR | Status: DC | PRN
  Administered 2015-12-29 (×2): 25 ug via INTRAVENOUS
  Administered 2015-12-29: 50 ug via INTRAVENOUS

## 2015-12-29 MED ORDER — LACTATED RINGERS IV SOLN: INTRAVENOUS | Status: DC

## 2015-12-29 MED ORDER — LACTATED RINGERS IV SOLN
INTRAVENOUS | Status: DC | PRN
  Administered 2015-12-29: 09:00:00 via INTRAVENOUS

## 2015-12-29 MED ORDER — ONDANSETRON HCL 4 MG/2ML IJ SOLN: 4.0000 mg | Freq: Once | INTRAMUSCULAR | Status: DC | PRN

## 2015-12-29 MED ORDER — FAMOTIDINE 20 MG PO TABS
20.0000 mg | ORAL_TABLET | Freq: Once | ORAL | Status: AC
Start: 2015-12-29 — End: 2015-12-29
  Administered 2015-12-29: 20 mg via ORAL

## 2015-12-29 MED ORDER — FENTANYL CITRATE (PF) 100 MCG/2ML IJ SOLN: 25.0000 ug | INTRAMUSCULAR | Status: DC | PRN

## 2015-12-29 MED ORDER — LIDOCAINE HCL (CARDIAC) 20 MG/ML IV SOLN
INTRAVENOUS | Status: DC | PRN
  Administered 2015-12-29: 100 mg via INTRAVENOUS

## 2015-12-29 MED ORDER — ESMOLOL HCL 100 MG/10ML IV SOLN
INTRAVENOUS | Status: DC | PRN
  Administered 2015-12-29 (×2): 20 mg via INTRAVENOUS

## 2015-12-29 SURGICAL SUPPLY — 17 items
CANISTER SUCT 3000ML (MISCELLANEOUS) ×3 IMPLANT
CATH ROBINSON RED A/P 16FR (CATHETERS) ×3 IMPLANT
CORD URO TURP 10FT (MISCELLANEOUS) ×3 IMPLANT
ELECT LOOP MED HF 24F 12D (CUTTING LOOP) ×3 IMPLANT
ELECT REM PT RETURN 9FT ADLT (ELECTROSURGICAL) ×3
ELECT RESECT POWERBALL 24F (MISCELLANEOUS) ×3 IMPLANT
ELECTRODE REM PT RTRN 9FT ADLT (ELECTROSURGICAL) ×2 IMPLANT
GLOVE BIO SURGEON STRL SZ 6.5 (GLOVE) ×3 IMPLANT
GLOVE INDICATOR 7.0 STRL GRN (GLOVE) ×3 IMPLANT
GOWN STRL REUS W/ TWL LRG LVL3 (GOWN DISPOSABLE) ×4 IMPLANT
GOWN STRL REUS W/TWL LRG LVL3 (GOWN DISPOSABLE) ×2
IV LACTATED RINGERS 1000ML (IV SOLUTION) ×3 IMPLANT
KIT RM TURNOVER CYSTO AR (KITS) ×3 IMPLANT
PACK DNC HYST (MISCELLANEOUS) ×3 IMPLANT
PAD OB MATERNITY 4.3X12.25 (PERSONAL CARE ITEMS) ×3 IMPLANT
PAD PREP 24X41 OB/GYN DISP (PERSONAL CARE ITEMS) ×3 IMPLANT
TUBING CONNECTING 10 (TUBING) ×3 IMPLANT

## 2015-12-29 NOTE — Op Note (Signed)
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  Procedure: 1. Exam under anesthesia 2. Vaginoscopy 3 .Attempted Fractional D&C - failed 3. Attempted Hysteroscopy- failed  Surgeon: Christeen DouglasBethany Sherrol Vicars, MD  Assistant(s):  None  Anesthesia: General LMA anesthesia  Anesthesiologist: No responsible provider has been recorded for the case. Anesthesiologist: Naomie DeanWilliam K Kephart, MD CRNA: Paulette BlanchPierre Paras, CRNA  Estimated Blood Loss:  Minimal         Total IV Fluids: 800ml  Urine Output: 50 mL          Specimens: None         Complications:  None with the medicine or anesthesia         Disposition: PACU - hemodynamically stable.         Condition: stable  Findings: Vaginal redundancy with difficulty finding the cervix. NO IUD STRINGS WERE VISUALIZED AT THE CERVICAL OS. Instruments used in attempt: several sizes of Graves specula with a large condom sheath to hold vaginal walls back; right angle retractors; lighted deavers and regular deavers; several sizes of weighted specula. Three assistants were used to retract.  The left labia minor was noted to be filled with varicosities. The groin folds were irritated and the skin broken.  Indication for procedure/Consents: 65 y.o.  here for scheduled surgery for the aforementioned diagnoses. Risks of surgery were discussed with the patient including but not limited to: bleeding which may require transfusion; infection which may require antibiotics; injury to uterus or surrounding organs; intrauterine scarring which may impair future fertility; need for additional procedures including laparotomy or laparoscopy; and other postoperative/anesthesia complications. Written  informed consent was obtained.    Procedure Details:   The patient was taken to the operating room where anesthesia was administered and was found to be adequate. After a formal and adequate timeout was performed, she was placed in the dorsal lithotomy position and examined with the above findings. She was then prepped and draped in the sterile manner. Her bladder was catheterized for an estimated amount of clear, yellow urine. A weighed speculum was then placed in the patient's vagina and a single tooth tenaculum was applied to the anterior lip of the cervix with difficulty.  Despite the tenaculum, we were unable to visualize the external cervical os while simultaneously passing any other instruments into the vagina. The vaginal lips were compressed in the midline without using penetrating instruments and the vagina filled with fluid. This was adequate to see the cervix, which appeared normal. No strings were noted at the cervical os.   At this point, the patient needed to be removed from the slight Trendelenburg we had placed her in to assist with intubation. Difficulty with breathing and the inability to dilate her cervix caused the procedure to be terminated.   The tenaculum was removed from the anterior lip of the cervix and the vaginal speculum was removed after applying silver nitrate for good hemostasis.   The patient was taken to the recovery area awake and in stable condition. She received iv Toradol prior to leaving the OR.  The patient will be discharged to home as per PACU criteria. Routine postoperative instructions given. She was prescribed Ibuprofen and Colace. She will follow up in the clinic in two weeks for postoperative evaluation.

## 2015-12-29 NOTE — Anesthesia Procedure Notes (Signed)
Procedure Name: LMA Insertion Date/Time: 12/29/2015 9:11 AM Performed by: Paulette BlanchPARAS, Bennet Kujawa Pre-anesthesia Checklist: Patient identified, Patient being monitored, Timeout performed, Emergency Drugs available and Suction available Patient Re-evaluated:Patient Re-evaluated prior to inductionOxygen Delivery Method: Circle system utilized Preoxygenation: Pre-oxygenation with 100% oxygen Intubation Type: IV induction Ventilation: Mask ventilation without difficulty LMA: LMA inserted LMA Size: 3.5 Tube type: Oral Number of attempts: 1 Placement Confirmation: positive ETCO2 and breath sounds checked- equal and bilateral Tube secured with: Tape Dental Injury: Teeth and Oropharynx as per pre-operative assessment

## 2015-12-29 NOTE — Interval H&P Note (Deleted)
History and Physical Interval Note:  12/29/2015 8:51 AM  Jasmine Bottomsarlene O Osborn  has presented today for surgery, with the diagnosis of simple endometrial hyperplasia  The various methods of treatment have been discussed with the patient and family. After consideration of risks, benefits and other options for treatment, the patient has consented to  Procedure(s): DILATATION AND CURETTAGE /HYSTEROSCOPY (N/A) as a surgical intervention .  The patient's history has been reviewed, patient examined, no change in status, stable for surgery.  I have reviewed the patient's chart and labs.  Questions were answered to the patient's satisfaction.     Christeen DouglasBEASLEY, Gaby Harney

## 2015-12-29 NOTE — Anesthesia Postprocedure Evaluation (Signed)
Anesthesia Post Note  Patient: Jasmine Osborn  Procedure(s) Performed: Procedure(s): EXAM UNDER ANESTHESIA, attempted Fractional D & C  Patient location during evaluation: PACU Anesthesia Type: General Level of consciousness: awake and alert Pain management: pain level controlled Vital Signs Assessment: post-procedure vital signs reviewed and stable Respiratory status: spontaneous breathing and respiratory function stable Cardiovascular status: stable Anesthetic complications: no    Last Vitals:  Filed Vitals:   12/29/15 1340 12/29/15 1405  BP: 131/58 123/53  Pulse: 95 90  Temp: 36.7 C   Resp: 22 20    Last Pain:  Filed Vitals:   12/29/15 1406  PainSc: 0-No pain                 Giavonni Fonder K

## 2015-12-29 NOTE — Discharge Instructions (Signed)
We will set you up for an ultrasound in our office in the next 1-2 weeks to see if we can locate the IUD and measure the amount of tissue you have growing in your uterus. I will then plan to send [facility] (or [facility], if you prefer) to see what they recommend.   Discharge instructions after a hysteroscopy with dilation and curettage  Signs and Symptoms to Report  Call our office at [phone] if you have any of the following:    Fever over 100.4 degrees or higher  Severe stomach pain not relieved with pain medications  Bright red bleeding thats heavier than a period that does not slow with rest after the first 24 hours  To go the bathroom a lot (frequency), you cant hold your urine (urgency), or it hurts when you empty your bladder (urinate)  Chest pain  Shortness of breath  Pain in the calves of your legs  Severe nausea and vomiting not relieved with anti-nausea medications  Any concerns  What You Can Expect after Surgery  You may see some pink tinged, bloody fluid. This is normal. You may also have cramping for several days.   Activities after Your Discharge Follow these guidelines to help speed your recovery at home:  Dont drive if you are in pain or taking narcotic pain medicine. You may drive when you can safely slam on the brakes, turn the wheel forcefully, and rotate your torso comfortably. This is typically 4-7 days. Practice in a parking lot or side street prior to attempting to drive regularly.   Ask others to help with household chores for 4 weeks.  Dont do strenuous activities, exercises, or sports like vacuuming, tennis, squash, etc. until your doctor says it is safe to do so.  Walk as you feel able. Rest often since it may take a week or two for your energy level to return to normal.   You may climb stairs  Avoid constipation:   -Eat fruits, vegetables, and whole grains. Eat small meals as your appetite will take time to  return to normal.   -Drink 6 to 8 glasses of water each day unless your doctor has told you to limit your fluids.   -Use a laxative or stool softener as needed if constipation becomes a problem. You may take Miralax, metamucil, Citrucil, Colace, Senekot, FiberCon, etc. If this does not relieve the constipation, try two tablespoons of Milk Of Magnesia every 8 hours until your bowels move.   You may shower.   Do not get in a hot tub, swimming pool, etc. until your doctor agrees.  Do not douche, use tampons, or have sex until your doctor says it is okay, usually about 2 weeks.  Take your pain medicine when you need it. The medicine may not work as well if the pain is bad.  Take the medicines you were taking before surgery. Other medications you might need are pain medications (ibuprofen), medications for constipation (Colace) and nausea medications (Zofran).    AMBULATORY SURGERY  DISCHARGE INSTRUCTIONS   1) The drugs that you were given will stay in your system until tomorrow so for the next 24 hours you should not:  A) Drive an automobile B) Make any legal decisions C) Drink any alcoholic beverage   2) You may resume regular meals tomorrow.  Today it is better to start with liquids and gradually work up to solid foods.  You may eat anything you prefer, but  it is better to start with liquids, then soup and crackers, and gradually work up to solid foods.   3) Please notify your doctor immediately if you have any unusual bleeding, trouble breathing, redness and pain at the surgery site, drainage, fever, or pain not relieved by medication.    4) Additional Instructions:        Please contact your physician with any problems or Same Day Surgery at [phone], Monday through Friday 6 am to 4 pm, or [place] at [facility] number at [phone].

## 2015-12-29 NOTE — Anesthesia Preprocedure Evaluation (Addendum)
Anesthesia Evaluation  Patient identified by MRN, date of birth, ID band Patient awake    Reviewed: Allergy & Precautions, NPO status , Patient's Chart, lab work & pertinent test results  History of Anesthesia Complications Negative for: history of anesthetic complications  Airway Mallampati: III       Dental   Pulmonary neg pulmonary ROS, former smoker,           Cardiovascular hypertension, Pt. on medications and Pt. on home beta blockers + Peripheral Vascular Disease       Neuro/Psych Depression Schizophrenia    GI/Hepatic negative GI ROS, Neg liver ROS,   Endo/Other  diabetes, Type 2, Oral Hypoglycemic Agents  Renal/GU negative Renal ROS     Musculoskeletal   Abdominal   Peds  Hematology negative hematology ROS (+)   Anesthesia Other Findings   Reproductive/Obstetrics                            Anesthesia Physical Anesthesia Plan  ASA: III  Anesthesia Plan: General   Post-op Pain Management:    Induction: Intravenous  Airway Management Planned: LMA  Additional Equipment:   Intra-op Plan:   Post-operative Plan:   Informed Consent: I have reviewed the patients History and Physical, chart, labs and discussed the procedure including the risks, benefits and alternatives for the proposed anesthesia with the patient or authorized representative who has indicated his/her understanding and acceptance.     Plan Discussed with:   Anesthesia Plan Comments:         Anesthesia Quick Evaluation

## 2015-12-29 NOTE — Progress Notes (Signed)
Dr Henrene HawkingKephart aware RA sat 88-91%. Keeping pt in PACU longer.

## 2015-12-29 NOTE — Transfer of Care (Signed)
Immediate Anesthesia Transfer of Care Note  Patient: Jasmine Osborn  Procedure(s) Performed: Procedure(s): EXAM UNDER ANESTHESIA, attempted Fractional D & C  Patient Location: PACU  Anesthesia Type:General  Level of Consciousness: awake  Airway & Oxygen Therapy: Patient Spontanous Breathing and Patient connected to face mask oxygen  Post-op Assessment: Report given to RN and Post -op Vital signs reviewed and stable  Post vital signs: Reviewed and stable  Last Vitals:  Filed Vitals:   12/29/15 0828  BP: 163/69  Pulse: 98  Temp: 36.7 C  Resp: 16    Complications: No apparent anesthesia complications

## 2015-12-29 NOTE — Interval H&P Note (Signed)
History and Physical Interval Note:  12/29/2015 8:52 AM  Jasmine Osborn  has presented today for surgery, with the diagnosis of simple endometrial hyperplasia without atypia and a BMI of 47.3.  The various methods of treatment have been discussed with the patient and family. After consideration of risks, benefits and other options for treatment, the patient has consented to  Procedure(s): DILATATION AND CURETTAGE /HYSTEROSCOPY (N/A) with IUD removal as a surgical intervention .  The patient's history has been reviewed, patient examined, no change in status, stable for surgery.  I have reviewed the patient's chart and labs.  Questions were answered to the patient's satisfaction.     Christeen DouglasBEASLEY, Carrigan Delafuente

## 2016-02-28 ENCOUNTER — Ambulatory Visit: Payer: Medicare Other

## 2016-04-17 ENCOUNTER — Inpatient Hospital Stay: Payer: Medicare Other

## 2016-04-24 ENCOUNTER — Ambulatory Visit: Payer: Medicare Other

## 2016-05-08 ENCOUNTER — Inpatient Hospital Stay: Payer: Managed Care, Other (non HMO) | Attending: Obstetrics and Gynecology | Admitting: Obstetrics and Gynecology

## 2016-05-08 ENCOUNTER — Encounter (INDEPENDENT_AMBULATORY_CARE_PROVIDER_SITE_OTHER): Payer: Self-pay

## 2016-05-08 ENCOUNTER — Encounter: Payer: Self-pay | Admitting: Obstetrics and Gynecology

## 2016-05-08 VITALS — BP 169/78 | HR 101 | Temp 98.0°F | Ht 64.0 in | Wt 273.6 lb

## 2016-05-08 DIAGNOSIS — Z6841 Body Mass Index (BMI) 40.0 and over, adult: Secondary | ICD-10-CM | POA: Diagnosis not present

## 2016-05-08 DIAGNOSIS — E119 Type 2 diabetes mellitus without complications: Secondary | ICD-10-CM

## 2016-05-08 DIAGNOSIS — N8502 Endometrial intraepithelial neoplasia [EIN]: Secondary | ICD-10-CM

## 2016-05-08 DIAGNOSIS — E785 Hyperlipidemia, unspecified: Secondary | ICD-10-CM

## 2016-05-08 DIAGNOSIS — F329 Major depressive disorder, single episode, unspecified: Secondary | ICD-10-CM

## 2016-05-08 DIAGNOSIS — I739 Peripheral vascular disease, unspecified: Secondary | ICD-10-CM

## 2016-05-08 DIAGNOSIS — I10 Essential (primary) hypertension: Secondary | ICD-10-CM

## 2016-05-08 DIAGNOSIS — Z79899 Other long term (current) drug therapy: Secondary | ICD-10-CM

## 2016-05-08 DIAGNOSIS — Z7982 Long term (current) use of aspirin: Secondary | ICD-10-CM

## 2016-05-08 DIAGNOSIS — Z87891 Personal history of nicotine dependence: Secondary | ICD-10-CM

## 2016-05-08 DIAGNOSIS — Z7984 Long term (current) use of oral hypoglycemic drugs: Secondary | ICD-10-CM

## 2016-05-08 NOTE — Progress Notes (Signed)
Patient here for evaluation. No complaints today. Patient has a large hernia that  She states can not be repaired  And has a large heal ulcer that is being treated by a podiatrist.

## 2016-05-08 NOTE — Progress Notes (Signed)
Gynecologic Oncology Consult Visit   Referring Provider: Christeen Douglas, MD  Chief Concern: Endometrial hyperplasia without atypical, simple  Subjective:  Jasmine Osborn is a 65 y.o. female who is seen in consultation from Dr. 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 Her care is complicated by morbid obesity with a BMI of 47.   Dr Dalbert Garnet was unable to find cervix despite positioning and multpile providers trying. She went to the OR for exam under anesthesia, vaginoscopy, attempted Fractional D&C/Hysteroscopy failed. They were unable to place her in sufficient Trendlenberg for visualization of the cervix.   TVUS 02/13/2016  No IUD visualized; Uterus: 8.5 x 7.3 x 6.7 cmEM Stripe 14.61 mm. No masses; neither ovary seen.   In the past she had a US pelvis in 2011 that revealed a stripe that was 23.8 mm.   Provera 10 mg daily but that made her bleed so she stopped. No bleeding in 3 months.     Past Medical History: Past Medical History:  Diagnosis Date  . Depression   . Diabetes mellitus without complication (HCC)   . Elevated lipids   . Hernia of abdominal wall   . Hypertension   . Psychosis   . PVD (peripheral vascular disease) (HCC)    heel ulcer Lt     Past Surgical History: Past Surgical History:  Procedure Laterality Date  . COLON SURGERY    . DILATION AND CURETTAGE OF UTERUS    . HERNIA REPAIR    . TIBIA FRACTURE SURGERY Left     Past Gynecologic History:  As per HPI  OB History: P2  Family History: No family history on file.  Social History: Social History   Social History  . Marital status: Married    Spouse name: N/A  . Number of children: N/A  . Years of education: N/A   Occupational History  . Not on file.   Social History Main Topics  . Smoking status: Former Games developer  . Smokeless tobacco: Not on file  . Alcohol use No  . Drug use: No  . Sexual activity: Not on file   Other  Topics Concern  . Not on file   Social History Narrative  . No narrative on file    Allergies: No Known Allergies  Current Medications: Current Outpatient Prescriptions  Medication Sig Dispense Refill  . amLODipine (NORVASC) 5 MG tablet Take 5 mg by mouth daily.    Marland Kitchen aspirin 81 MG tablet Take 81 mg by mouth daily.    . fenofibrate 160 MG tablet Take 160 mg by mouth at bedtime.    Marland Kitchen glimepiride (AMARYL) 2 MG tablet Take 2 mg by mouth daily with breakfast.    . latanoprost (XALATAN) 0.005 % ophthalmic solution USE 1 DROP IN [facility] EYE AT BEDTIME    . losartan (COZAAR) 50 MG tablet Take 50 mg by mouth daily.    . metFORMIN (GLUCOPHAGE) 500 MG tablet Take 500 mg by mouth 2 (two) times daily with a meal.    . metoprolol succinate (TOPROL-XL) 50 MG 24 hr tablet Take 50 mg by mouth daily. Take with or immediately following a meal.    . mirtazapine (REMERON) 30 MG tablet Take 30 mg by mouth at bedtime.    . potassium chloride (K-DUR,KLOR-CON) 10 MEQ tablet Take 10 mEq by mouth daily.    . risperiDONE (RISPERDAL) 1 MG tablet Take 1 mg by mouth at bedtime.    . torsemide (DEMADEX) 5  MG tablet Take 5 mg by mouth daily.     No current facility-administered medications for this visit.     Review of Systems General: no complaints  HEENT: no complaints  Lungs: SOB, cough  Cardiac: no complaints  GI: diarrhea/constipation  GU: no complaints  Musculoskeletal: no complaints  Extremities: wraps on both legs and ulcer  Skin: no complaints  Neuro: no complaints  Endocrine: no complaints  Psych: no complaints       Objective:  Physical Examination:  BP (!) 169/78 (BP Location: Right Wrist, Patient Position: Sitting) Comment: patient states BP usuall in control monitors trhough PCP Weill recheck   Pulse (!) 101   Temp 98 F (36.7 C) (Tympanic)   Ht 5\' 4"  (1.626 m) Comment: stated ht  Wt 273 lb 9.5 oz (124.1 kg)   BMI 46.96 kg/m    ECOG Performance Status: 0 - Asymptomatic  General  appearance: alert, cooperative and appears stated age HEENT:PERRLA, extra ocular movement intact and sclera clear, anicteric Lymph node survey: non-palpable, axillary and supraclavicular Cardiovascular: regular rate and rhythm Respiratory: normal air entry, lungs clear to auscultation Abdomen: soft, protuberant, non-tender, without masses or organomegaly, obvious large ventral hernia Extremities: bilaterally wrapped in bandages.  Neurological exam reveals alert, oriented, normal speech, no focal findings or movement disorder noted.  Pelvic: deferred given the history of difficult and challenging exams   Lab Review n/a  Radiologic Imaging: n/a    Assessment:  Jasmine Osborn is a 65 y.o. female diagnosed with EIN, simple s/p IUD with difficult exam due to obesity and anatomical considerations. Medical co-morbidities complicating care: obesity.  Plan:     Visit Diagnoses    EIN (endometrial intraepithelial neoplasia)    -  Primary      We discussed options for management. She is not in a good situation and has multiple comorbidities. If concern regarding IUD may consider KUB which I discussed with patient. With regard to EIN, serial ultrasound in November with Dr. Dalbert Garnet. She is aware of her increased risk of cancer due to obesity and the risk that this can progress to malignancy. While reported incidence of this with simple hyperplasia is low, her degree of obesity puts her at high risk.   I strongly urged her to lose weight, we discussed chair exercises and weight lifting. Hopefully with weight loss we will be able to examine her in the future.    Follow up as needed.   The patient's diagnosis, an outline of the further diagnostic and laboratory studies which will be required, the recommendation, and alternatives were discussed.  All questions were answered to the patient's satisfaction.  A total of 30 minutes were spent with the patient/family today; 95% was spent in  education, counseling and coordination of care for EIN  Artelia Laroche, MD    CC:  Christeen Douglas, MD

## 2016-05-09 ENCOUNTER — Telehealth: Payer: Self-pay

## 2016-05-09 NOTE — Telephone Encounter (Signed)
  Oncology Nurse Navigator Documentation  Navigator Location: [facility] (05/09/16 1500) Navigator Encounter Type: Telephone (05/09/16 1500)                                          Time Spent with Patient: 30 (05/09/16 1500)  Anson Fret at Dr Harlon Flor office notified that patient needs U/S in Nov per Dr Sonia Side consult.

## 2016-12-22 ENCOUNTER — Emergency Department: Payer: 59

## 2016-12-22 ENCOUNTER — Inpatient Hospital Stay: Payer: 59

## 2016-12-22 ENCOUNTER — Inpatient Hospital Stay
Admission: EM | Admit: 2016-12-22 | Discharge: 2017-01-03 | DRG: 870 | Disposition: A | Discharge status: Skilled Nursing Facility | Payer: 59 | Attending: Internal Medicine | Admitting: Internal Medicine

## 2016-12-22 ENCOUNTER — Encounter: Payer: Self-pay | Admitting: Emergency Medicine

## 2016-12-22 DIAGNOSIS — N19 Unspecified kidney failure: Secondary | ICD-10-CM

## 2016-12-22 DIAGNOSIS — I110 Hypertensive heart disease with heart failure: Secondary | ICD-10-CM | POA: Diagnosis present

## 2016-12-22 DIAGNOSIS — J441 Chronic obstructive pulmonary disease with (acute) exacerbation: Secondary | ICD-10-CM | POA: Diagnosis present

## 2016-12-22 DIAGNOSIS — Z79899 Other long term (current) drug therapy: Secondary | ICD-10-CM

## 2016-12-22 DIAGNOSIS — A419 Sepsis, unspecified organism: Secondary | ICD-10-CM | POA: Diagnosis not present

## 2016-12-22 DIAGNOSIS — L03314 Cellulitis of groin: Secondary | ICD-10-CM | POA: Diagnosis not present

## 2016-12-22 DIAGNOSIS — N390 Urinary tract infection, site not specified: Secondary | ICD-10-CM | POA: Diagnosis present

## 2016-12-22 DIAGNOSIS — J9602 Acute respiratory failure with hypercapnia: Secondary | ICD-10-CM | POA: Diagnosis present

## 2016-12-22 DIAGNOSIS — E876 Hypokalemia: Secondary | ICD-10-CM | POA: Diagnosis not present

## 2016-12-22 DIAGNOSIS — J9622 Acute and chronic respiratory failure with hypercapnia: Secondary | ICD-10-CM | POA: Diagnosis present

## 2016-12-22 DIAGNOSIS — I272 Pulmonary hypertension, unspecified: Secondary | ICD-10-CM | POA: Diagnosis present

## 2016-12-22 DIAGNOSIS — E1151 Type 2 diabetes mellitus with diabetic peripheral angiopathy without gangrene: Secondary | ICD-10-CM | POA: Diagnosis present

## 2016-12-22 DIAGNOSIS — L03315 Cellulitis of perineum: Secondary | ICD-10-CM | POA: Diagnosis present

## 2016-12-22 DIAGNOSIS — I248 Other forms of acute ischemic heart disease: Secondary | ICD-10-CM | POA: Diagnosis present

## 2016-12-22 DIAGNOSIS — N170 Acute kidney failure with tubular necrosis: Secondary | ICD-10-CM | POA: Diagnosis present

## 2016-12-22 DIAGNOSIS — E662 Morbid (severe) obesity with alveolar hypoventilation: Secondary | ICD-10-CM | POA: Diagnosis present

## 2016-12-22 DIAGNOSIS — A4159 Other Gram-negative sepsis: Secondary | ICD-10-CM | POA: Diagnosis present

## 2016-12-22 DIAGNOSIS — N179 Acute kidney failure, unspecified: Secondary | ICD-10-CM

## 2016-12-22 DIAGNOSIS — J9601 Acute respiratory failure with hypoxia: Secondary | ICD-10-CM

## 2016-12-22 DIAGNOSIS — L89893 Pressure ulcer of other site, stage 3: Secondary | ICD-10-CM | POA: Diagnosis present

## 2016-12-22 DIAGNOSIS — R6521 Severe sepsis with septic shock: Secondary | ICD-10-CM | POA: Diagnosis present

## 2016-12-22 DIAGNOSIS — Z6841 Body Mass Index (BMI) 40.0 and over, adult: Secondary | ICD-10-CM

## 2016-12-22 DIAGNOSIS — Z7984 Long term (current) use of oral hypoglycemic drugs: Secondary | ICD-10-CM

## 2016-12-22 DIAGNOSIS — F329 Major depressive disorder, single episode, unspecified: Secondary | ICD-10-CM | POA: Diagnosis present

## 2016-12-22 DIAGNOSIS — R778 Other specified abnormalities of plasma proteins: Secondary | ICD-10-CM

## 2016-12-22 DIAGNOSIS — I5033 Acute on chronic diastolic (congestive) heart failure: Secondary | ICD-10-CM | POA: Diagnosis present

## 2016-12-22 DIAGNOSIS — G9341 Metabolic encephalopathy: Secondary | ICD-10-CM | POA: Diagnosis present

## 2016-12-22 DIAGNOSIS — IMO0001 Reserved for inherently not codable concepts without codable children: Secondary | ICD-10-CM

## 2016-12-22 DIAGNOSIS — L8990 Pressure ulcer of unspecified site, unspecified stage: Secondary | ICD-10-CM | POA: Insufficient documentation

## 2016-12-22 DIAGNOSIS — F419 Anxiety disorder, unspecified: Secondary | ICD-10-CM | POA: Diagnosis present

## 2016-12-22 DIAGNOSIS — R0600 Dyspnea, unspecified: Secondary | ICD-10-CM

## 2016-12-22 DIAGNOSIS — T501X5A Adverse effect of loop [high-ceiling] diuretics, initial encounter: Secondary | ICD-10-CM | POA: Diagnosis not present

## 2016-12-22 DIAGNOSIS — J9690 Respiratory failure, unspecified, unspecified whether with hypoxia or hypercapnia: Secondary | ICD-10-CM

## 2016-12-22 DIAGNOSIS — Z7982 Long term (current) use of aspirin: Secondary | ICD-10-CM

## 2016-12-22 DIAGNOSIS — B961 Klebsiella pneumoniae [K. pneumoniae] as the cause of diseases classified elsewhere: Secondary | ICD-10-CM | POA: Diagnosis not present

## 2016-12-22 DIAGNOSIS — J9621 Acute and chronic respiratory failure with hypoxia: Secondary | ICD-10-CM | POA: Diagnosis present

## 2016-12-22 DIAGNOSIS — E873 Alkalosis: Secondary | ICD-10-CM | POA: Diagnosis not present

## 2016-12-22 DIAGNOSIS — E1165 Type 2 diabetes mellitus with hyperglycemia: Secondary | ICD-10-CM

## 2016-12-22 DIAGNOSIS — Z452 Encounter for adjustment and management of vascular access device: Secondary | ICD-10-CM

## 2016-12-22 DIAGNOSIS — R7989 Other specified abnormal findings of blood chemistry: Secondary | ICD-10-CM

## 2016-12-22 DIAGNOSIS — I959 Hypotension, unspecified: Secondary | ICD-10-CM

## 2016-12-22 DIAGNOSIS — Z87891 Personal history of nicotine dependence: Secondary | ICD-10-CM

## 2016-12-22 DIAGNOSIS — R0902 Hypoxemia: Secondary | ICD-10-CM

## 2016-12-22 DIAGNOSIS — Z5320 Procedure and treatment not carried out because of patient's decision for unspecified reasons: Secondary | ICD-10-CM | POA: Diagnosis not present

## 2016-12-22 DIAGNOSIS — R5383 Other fatigue: Secondary | ICD-10-CM

## 2016-12-22 DIAGNOSIS — J9692 Respiratory failure, unspecified with hypercapnia: Secondary | ICD-10-CM | POA: Diagnosis present

## 2016-12-22 DIAGNOSIS — F209 Schizophrenia, unspecified: Secondary | ICD-10-CM | POA: Diagnosis present

## 2016-12-22 LAB — BLOOD GAS, ARTERIAL
ACID-BASE DEFICIT: 5.6 mmol/L — AB (ref 0.0–2.0)
ACID-BASE DEFICIT: 3.7 mmol/L — AB (ref 0.0–2.0)
Acid-base deficit: 3.5 mmol/L — ABNORMAL HIGH (ref 0.0–2.0)
BICARBONATE: 27.9 mmol/L (ref 20.0–28.0)
BICARBONATE: 20.7 mmol/L (ref 20.0–28.0)
Bicarbonate: 23.7 mmol/L (ref 20.0–28.0)
FIO2: 50
FIO2: 0.45
FIO2: 0.6
LHR: 25 {breaths}/min
MECHVT: 500 mL
Mechanical Rate: 16
O2 SAT: 91.1 %
O2 SAT: 91.8 %
O2 SAT: 98.3 %
PATIENT TEMPERATURE: 37
PATIENT TEMPERATURE: 37
PATIENT TEMPERATURE: 37
PCO2 ART: 62 mmHg — AB (ref 32.0–48.0)
PCO2 ART: 35 mmHg (ref 32.0–48.0)
PEEP/CPAP: 8 cmH2O
PEEP: 5 cmH2O
PH ART: 7.19 — AB (ref 7.350–7.450)
PH ART: 7.38 (ref 7.350–7.450)
PO2 ART: 80 mmHg — AB (ref 83.0–108.0)
PO2 ART: 78 mmHg — AB (ref 83.0–108.0)
PO2 ART: 112 mmHg — AB (ref 83.0–108.0)
VT: 500 mL
pCO2 arterial: 84 mmHg (ref 32.0–48.0)
pH, Arterial: 7.13 — CL (ref 7.350–7.450)

## 2016-12-22 LAB — CBC
HCT: 43.6 % (ref 35.0–47.0)
HCT: 45.1 % (ref 35.0–47.0)
HEMOGLOBIN: 14 g/dL (ref 12.0–16.0)
Hemoglobin: 13.5 g/dL (ref 12.0–16.0)
MCH: 27.5 pg (ref 26.0–34.0)
MCH: 27.1 pg (ref 26.0–34.0)
MCHC: 30.9 g/dL — AB (ref 32.0–36.0)
MCHC: 31.2 g/dL — AB (ref 32.0–36.0)
MCV: 88.9 fL (ref 80.0–100.0)
MCV: 87.1 fL (ref 80.0–100.0)
PLATELETS: 193 10*3/uL (ref 150–440)
Platelets: 237 10*3/uL (ref 150–440)
RBC: 4.9 MIL/uL (ref 3.80–5.20)
RBC: 5.17 MIL/uL (ref 3.80–5.20)
RDW: 19.5 % — AB (ref 11.5–14.5)
RDW: 18.7 % — ABNORMAL HIGH (ref 11.5–14.5)
WBC: 10.8 10*3/uL (ref 3.6–11.0)
WBC: 15.5 10*3/uL — ABNORMAL HIGH (ref 3.6–11.0)

## 2016-12-22 LAB — URINALYSIS, ROUTINE W REFLEX MICROSCOPIC
Bilirubin Urine: NEGATIVE
Glucose, UA: NEGATIVE mg/dL
Hgb urine dipstick: NEGATIVE
Ketones, ur: NEGATIVE mg/dL
Nitrite: NEGATIVE
PROTEIN: 100 mg/dL — AB
SQUAMOUS EPITHELIAL / LPF: NONE SEEN
Specific Gravity, Urine: 1.019 (ref 1.005–1.030)
pH: 5 (ref 5.0–8.0)

## 2016-12-22 LAB — URINE DRUG SCREEN, QUALITATIVE (ARMC ONLY)
Amphetamines, Ur Screen: NOT DETECTED
BARBITURATES, UR SCREEN: NOT DETECTED
Benzodiazepine, Ur Scrn: NOT DETECTED
COCAINE METABOLITE, UR ~~LOC~~: NOT DETECTED
Cannabinoid 50 Ng, Ur ~~LOC~~: NOT DETECTED
MDMA (ECSTASY) UR SCREEN: NOT DETECTED
METHADONE SCREEN, URINE: NOT DETECTED
OPIATE, UR SCREEN: NOT DETECTED
Phencyclidine (PCP) Ur S: NOT DETECTED
TRICYCLIC, UR SCREEN: NOT DETECTED

## 2016-12-22 LAB — LACTIC ACID, PLASMA: LACTIC ACID, VENOUS: 1 mmol/L (ref 0.5–1.9)

## 2016-12-22 LAB — ETHANOL

## 2016-12-22 LAB — TROPONIN I
TROPONIN I: 0.19 ng/mL — AB (ref <0.03)
Troponin I: 0.07 ng/mL (ref <0.03)

## 2016-12-22 LAB — BASIC METABOLIC PANEL
Anion gap: 7 (ref 5–15)
BUN: 49 mg/dL — AB (ref 6–20)
CALCIUM: 9.2 mg/dL (ref 8.9–10.3)
CHLORIDE: 104 mmol/L (ref 101–111)
CO2: 25 mmol/L (ref 22–32)
CREATININE: 3.5 mg/dL — AB (ref 0.44–1.00)
GFR calc non Af Amer: 13 mL/min — ABNORMAL LOW (ref >60)
GFR, EST AFRICAN AMERICAN: 15 mL/min — AB (ref >60)
GLUCOSE: 79 mg/dL (ref 65–99)
Potassium: 4.4 mmol/L (ref 3.5–5.1)
Sodium: 136 mmol/L (ref 135–145)

## 2016-12-22 LAB — PHOSPHORUS: PHOSPHORUS: 4.5 mg/dL (ref 2.5–4.6)

## 2016-12-22 LAB — PROTIME-INR
INR: 1.38
PROTHROMBIN TIME: 17.1 s — AB (ref 11.4–15.2)

## 2016-12-22 LAB — HEPATIC FUNCTION PANEL
ALT: 18 U/L (ref 14–54)
AST: 17 U/L (ref 15–41)
Albumin: 3.2 g/dL — ABNORMAL LOW (ref 3.5–5.0)
Alkaline Phosphatase: 37 U/L — ABNORMAL LOW (ref 38–126)
BILIRUBIN DIRECT: 0.4 mg/dL (ref 0.1–0.5)
BILIRUBIN INDIRECT: 0.6 mg/dL (ref 0.3–0.9)
TOTAL PROTEIN: 6.7 g/dL (ref 6.5–8.1)
Total Bilirubin: 1 mg/dL (ref 0.3–1.2)

## 2016-12-22 LAB — GLUCOSE, CAPILLARY
Glucose-Capillary: 148 mg/dL — ABNORMAL HIGH (ref 65–99)
Glucose-Capillary: 150 mg/dL — ABNORMAL HIGH (ref 65–99)
Glucose-Capillary: 64 mg/dL — ABNORMAL LOW (ref 65–99)

## 2016-12-22 LAB — PROCALCITONIN: PROCALCITONIN: 0.23 ng/mL

## 2016-12-22 LAB — MRSA PCR SCREENING: MRSA by PCR: NEGATIVE

## 2016-12-22 LAB — ACETAMINOPHEN LEVEL: Acetaminophen (Tylenol), Serum: <10 ug/mL — ABNORMAL LOW (ref 10–30)

## 2016-12-22 LAB — LIPASE, BLOOD: Lipase: 18 U/L (ref 11–51)

## 2016-12-22 LAB — SALICYLATE LEVEL

## 2016-12-22 LAB — TRIGLYCERIDES: Triglycerides: 368 mg/dL — ABNORMAL HIGH (ref <150)

## 2016-12-22 LAB — APTT: APTT: 30 s (ref 24–36)

## 2016-12-22 LAB — MAGNESIUM: MAGNESIUM: 1.8 mg/dL (ref 1.7–2.4)

## 2016-12-22 LAB — OSMOLALITY: Osmolality: 302 mOsm/kg — ABNORMAL HIGH (ref 275–295)

## 2016-12-22 LAB — BRAIN NATRIURETIC PEPTIDE: B Natriuretic Peptide: 569 pg/mL — ABNORMAL HIGH (ref 0.0–100.0)

## 2016-12-22 MED ORDER — SODIUM CHLORIDE 0.9 % IV BOLUS (SEPSIS)
1000.0000 mL | Freq: Once | INTRAVENOUS | Status: AC
  Administered 2016-12-22: 1000 mL via INTRAVENOUS

## 2016-12-22 MED ORDER — INSULIN ASPART 100 UNIT/ML ~~LOC~~ SOLN
0.0000 [IU] | SUBCUTANEOUS | Status: DC
  Administered 2016-12-23 (×2): 3 [IU] via SUBCUTANEOUS
  Administered 2016-12-23: 7 [IU] via SUBCUTANEOUS
  Administered 2016-12-23 (×3): 4 [IU] via SUBCUTANEOUS
  Administered 2016-12-23 – 2016-12-24 (×3): 3 [IU] via SUBCUTANEOUS
  Administered 2016-12-25: 4 [IU] via SUBCUTANEOUS
  Administered 2016-12-25: 3 [IU] via SUBCUTANEOUS
  Administered 2016-12-26: 4 [IU] via SUBCUTANEOUS
  Administered 2016-12-26 – 2016-12-28 (×6): 3 [IU] via SUBCUTANEOUS
  Administered 2016-12-28: 4 [IU] via SUBCUTANEOUS
  Administered 2016-12-29 – 2016-12-30 (×5): 3 [IU] via SUBCUTANEOUS
  Administered 2016-12-30: 4 [IU] via SUBCUTANEOUS
  Filled 2016-12-22 (×4): qty 3
  Filled 2016-12-22: qty 7
  Filled 2016-12-22: qty 3
  Filled 2016-12-22: qty 4
  Filled 2016-12-22 (×6): qty 3
  Filled 2016-12-22: qty 4
  Filled 2016-12-22 (×3): qty 3
  Filled 2016-12-22: qty 1
  Filled 2016-12-22 (×2): qty 4
  Filled 2016-12-22: qty 3
  Filled 2016-12-22: qty 4
  Filled 2016-12-22: qty 3
  Filled 2016-12-22 (×2): qty 4

## 2016-12-22 MED ORDER — SENNOSIDES 8.8 MG/5ML PO SYRP
5.0000 mL | ORAL_SOLUTION | Freq: Two times a day (BID) | ORAL | Status: DC | PRN
  Filled 2016-12-22: qty 5

## 2016-12-22 MED ORDER — FENTANYL CITRATE (PF) 100 MCG/2ML IJ SOLN
100.0000 ug | Freq: Once | INTRAMUSCULAR | Status: AC
  Administered 2016-12-22: 100 ug via INTRAVENOUS

## 2016-12-22 MED ORDER — FAMOTIDINE IN NACL 20-0.9 MG/50ML-% IV SOLN
20.0000 mg | Freq: Two times a day (BID) | INTRAVENOUS | Status: DC
  Administered 2016-12-23 (×2): 20 mg via INTRAVENOUS
  Filled 2016-12-22 (×2): qty 50

## 2016-12-22 MED ORDER — PIPERACILLIN-TAZOBACTAM 3.375 G IVPB: 3.3750 g | Freq: Three times a day (TID) | INTRAVENOUS | Status: DC

## 2016-12-22 MED ORDER — FENTANYL 2500MCG IN NS 250ML (10MCG/ML) PREMIX INFUSION
100.0000 ug/h | INTRAVENOUS | Status: DC
  Filled 2016-12-22: qty 250

## 2016-12-22 MED ORDER — MIDAZOLAM HCL 2 MG/2ML IJ SOLN: 1.0000 mg | INTRAMUSCULAR | Status: DC | PRN

## 2016-12-22 MED ORDER — FENTANYL BOLUS VIA INFUSION
50.0000 ug | INTRAVENOUS | Status: DC | PRN
  Filled 2016-12-22: qty 100

## 2016-12-22 MED ORDER — DEXTROSE 50 % IV SOLN
1.0000 | Freq: Once | INTRAVENOUS | Status: AC
  Administered 2016-12-22: 50 mL via INTRAVENOUS
  Filled 2016-12-22: qty 50

## 2016-12-22 MED ORDER — MIDAZOLAM HCL 2 MG/2ML IJ SOLN
1.0000 mg | INTRAMUSCULAR | Status: AC | PRN
  Administered 2016-12-25: 1 mg via INTRAVENOUS
  Administered 2016-12-25: 2 mg via INTRAVENOUS
  Administered 2016-12-26: 1 mg via INTRAVENOUS
  Filled 2016-12-22 (×2): qty 2

## 2016-12-22 MED ORDER — DOPAMINE-DEXTROSE 3.2-5 MG/ML-% IV SOLN
2.0000 ug/kg/min | Freq: Once | INTRAVENOUS | Status: AC
  Administered 2016-12-22: 10 ug/kg/min via INTRAVENOUS

## 2016-12-22 MED ORDER — PIPERACILLIN-TAZOBACTAM 3.375 G IVPB 30 MIN
3.3750 g | Freq: Once | INTRAVENOUS | Status: AC
  Administered 2016-12-22: 3.375 g via INTRAVENOUS
  Filled 2016-12-22: qty 50

## 2016-12-22 MED ORDER — PIPERACILLIN-TAZOBACTAM 4.5 G IVPB
4.5000 g | Freq: Three times a day (TID) | INTRAVENOUS | Status: DC
  Filled 2016-12-22 (×2): qty 100

## 2016-12-22 MED ORDER — NOREPINEPHRINE BITARTRATE 1 MG/ML IV SOLN
0.0000 ug/min | Freq: Once | INTRAVENOUS | Status: DC
  Filled 2016-12-22 (×2): qty 4

## 2016-12-22 MED ORDER — BISACODYL 10 MG RE SUPP: 10.0000 mg | Freq: Every day | RECTAL | Status: DC | PRN

## 2016-12-22 MED ORDER — KETAMINE HCL 10 MG/ML IJ SOLN
250.0000 mg | Freq: Once | INTRAMUSCULAR | Status: AC
  Administered 2016-12-22: 200 mg via INTRAVENOUS
  Filled 2016-12-22: qty 25

## 2016-12-22 MED ORDER — CEFEPIME-DEXTROSE 1 GM/50ML IV SOLR
1.0000 g | INTRAVENOUS | Status: DC
  Administered 2016-12-22: 1 g via INTRAVENOUS
  Filled 2016-12-22 (×2): qty 50

## 2016-12-22 MED ORDER — PROPOFOL 1000 MG/100ML IV EMUL
5.0000 ug/kg/min | Freq: Once | INTRAVENOUS | Status: AC
  Administered 2016-12-22: 10 ug/kg/min via INTRAVENOUS
  Filled 2016-12-22: qty 100

## 2016-12-22 MED ORDER — KETAMINE HCL-SODIUM CHLORIDE 100-0.9 MG/10ML-% IV SOSY
250.0000 mg | PREFILLED_SYRINGE | Freq: Once | INTRAVENOUS | Status: DC
  Filled 2016-12-22: qty 30

## 2016-12-22 MED ORDER — NOREPINEPHRINE 4 MG/250ML-% IV SOLN
0.0000 ug/min | INTRAVENOUS | Status: DC
  Administered 2016-12-22: 28 ug/min via INTRAVENOUS
  Administered 2016-12-23 (×2): 26 ug/min via INTRAVENOUS
  Administered 2016-12-23: 28 ug/min via INTRAVENOUS
  Filled 2016-12-22 (×4): qty 250

## 2016-12-22 MED ORDER — DOPAMINE-DEXTROSE 3.2-5 MG/ML-% IV SOLN
INTRAVENOUS | Status: AC
  Filled 2016-12-22: qty 250

## 2016-12-22 MED ORDER — ASPIRIN 300 MG RE SUPP
300.0000 mg | RECTAL | Status: AC
  Administered 2016-12-22: 300 mg via RECTAL
  Filled 2016-12-22: qty 1

## 2016-12-22 MED ORDER — VANCOMYCIN HCL 10 G IV SOLR
1250.0000 mg | INTRAVENOUS | Status: DC
  Administered 2016-12-23 – 2016-12-24 (×2): 1250 mg via INTRAVENOUS
  Filled 2016-12-22 (×2): qty 1250

## 2016-12-22 MED ORDER — SODIUM CHLORIDE 0.9 % IV SOLN
25.0000 ug/min | INTRAVENOUS | Status: DC
  Filled 2016-12-22: qty 1

## 2016-12-22 MED ORDER — SODIUM CHLORIDE 0.9 % IV BOLUS (SEPSIS)
1000.0000 mL | INTRAVENOUS | Status: AC
  Administered 2016-12-22: 1000 mL via INTRAVENOUS

## 2016-12-22 MED ORDER — SODIUM CHLORIDE 0.9 % IV SOLN
250.0000 mL | INTRAVENOUS | Status: DC | PRN
Start: 2016-12-22 — End: 2016-12-25
  Administered 2016-12-22: 500 mL via INTRAVENOUS

## 2016-12-22 MED ORDER — PROPOFOL 1000 MG/100ML IV EMUL
5.0000 ug/kg/min | INTRAVENOUS | Status: DC
  Administered 2016-12-22 – 2016-12-23 (×2): 55 ug/kg/min via INTRAVENOUS
  Administered 2016-12-23: 35.067 ug/kg/min via INTRAVENOUS
  Administered 2016-12-23: 55 ug/kg/min via INTRAVENOUS
  Administered 2016-12-23: 20 ug/kg/min via INTRAVENOUS
  Administered 2016-12-23: 40 ug/kg/min via INTRAVENOUS
  Administered 2016-12-23: 30 ug/kg/min via INTRAVENOUS
  Administered 2016-12-23: 55 ug/kg/min via INTRAVENOUS
  Administered 2016-12-24 (×2): 30 ug/kg/min via INTRAVENOUS
  Administered 2016-12-24: 15 ug/kg/min via INTRAVENOUS
  Administered 2016-12-25: 40 ug/kg/min via INTRAVENOUS
  Administered 2016-12-25: 15 ug/kg/min via INTRAVENOUS
  Filled 2016-12-22 (×16): qty 100

## 2016-12-22 MED ORDER — DOPAMINE-DEXTROSE 3.2-5 MG/ML-% IV SOLN: 2.0000 ug/kg/min | INTRAVENOUS | Status: DC

## 2016-12-22 MED ORDER — FENTANYL 2500MCG IN NS 250ML (10MCG/ML) PREMIX INFUSION
25.0000 ug/h | INTRAVENOUS | Status: DC
  Administered 2016-12-22 – 2016-12-24 (×2): 50 ug/h via INTRAVENOUS
  Filled 2016-12-22: qty 250

## 2016-12-22 MED ORDER — HEPARIN SODIUM (PORCINE) 5000 UNIT/ML IJ SOLN
5000.0000 [IU] | Freq: Three times a day (TID) | INTRAMUSCULAR | Status: DC
  Administered 2016-12-22 – 2016-12-25 (×8): 5000 [IU] via SUBCUTANEOUS
  Filled 2016-12-22 (×8): qty 1

## 2016-12-22 MED ORDER — SUCCINYLCHOLINE CHLORIDE 20 MG/ML IJ SOLN
150.0000 mg | Freq: Once | INTRAMUSCULAR | Status: AC
  Administered 2016-12-22: 150 mg via INTRAVENOUS

## 2016-12-22 MED ORDER — ASPIRIN 81 MG PO CHEW: 324.0000 mg | CHEWABLE_TABLET | ORAL | Status: AC

## 2016-12-22 MED ORDER — VANCOMYCIN HCL IN DEXTROSE 1-5 GM/200ML-% IV SOLN
1000.0000 mg | Freq: Once | INTRAVENOUS | Status: AC
  Administered 2016-12-22: 1000 mg via INTRAVENOUS
  Filled 2016-12-22: qty 200

## 2016-12-22 MED ORDER — KETAMINE HCL 10 MG/ML IJ SOLN
INTRAMUSCULAR | Status: AC
  Filled 2016-12-22: qty 1

## 2016-12-22 NOTE — ED Notes (Signed)
Fingers a little dusky

## 2016-12-22 NOTE — ED Triage Notes (Signed)
Pt to ED via POV with husband. Per husband pt has been lethargic and weak on and off for the past week. Pt denies any complaints at this time. Upon assessment pt appears pale, with increased work of breathing, pt denies chest pain or shortness of breath. Pt O2 sats in triage were in the lower to mid 70's. Pt put on O2 and taken to room 6, Dr. Alphonzo LemmingsMcShane  And primary RN, Val,  aware.

## 2016-12-22 NOTE — ED Notes (Signed)
Dr York Ceriseforbach notified troponin 0.07

## 2016-12-22 NOTE — ED Notes (Signed)
RT at bedside for blood gas

## 2016-12-22 NOTE — ED Notes (Signed)
MD aware of MAP

## 2016-12-22 NOTE — ED Provider Notes (Signed)
[facility] Emergency Department Provider Note  ____________________________________________   First MD Initiated Contact with Patient 12/22/16 1540     (approximate)  I have reviewed the triage vital signs and the nursing notes.   HISTORY  Chief Complaint Weakness  Level 5 caveat:  history/ROS limited by acute/critical illness  HPI Jasmine Osborn is a 66 y.o. female with medical history as documented below who presents for evaluation of generalized weakness.  She is not able to provide any history and states that she feels fine although she appears toxic and has to be awakened to give any history at all.  Her husband reports that she has been eating and drinking less than usual over the last week and that her symptoms have been gradually getting worse.  Over the last day they have gotten significantly worse and she is less responsive to him than usual.  At no point has she had any shortness of breath or chest pain although she was hypoxemic in the 70s in the waiting room and brought back immediately and started on oxygen.  She does have a history of CHF and takes fluid pill but she has not had any medication changes recently.  As I said, she is somnolent but wakes up to light voice and light touch and when she does so she is alert and denies any shortness of breath or chest pain or abdominal pain.  She has not had any fever of which the husband is aware.  She is morbidly obese but has had no other acute medical issues recently.  She has not complained of any difficulty with urination and has not had a cough.   Plan arrival her symptoms are severe and gradually worsening over time, nothing making them better nor worse, and became acutely worse today.   Past Medical History:  Diagnosis Date  . Depression   . Diabetes mellitus without complication (HCC)   . Elevated lipids   . Hernia of abdominal wall   . Hypertension   . Psychosis   . PVD (peripheral  vascular disease) (HCC)    heel ulcer Lt     Patient Active Problem List   Diagnosis Date Noted  . Respiratory failure with hypercapnia (HCC) 12/22/2016    Past Surgical History:  Procedure Laterality Date  . COLON SURGERY    . DILATION AND CURETTAGE OF UTERUS    . HERNIA REPAIR    . TIBIA FRACTURE SURGERY Left     Prior to Admission medications   Medication Sig Start Date End Date Taking? Authorizing Provider  amLODipine (NORVASC) 5 MG tablet Take 5 mg by mouth daily.   Yes Historical Provider, MD  aspirin 81 MG tablet Take 81 mg by mouth daily.   Yes Historical Provider, MD  fenofibrate 160 MG tablet Take 160 mg by mouth daily.    Yes Historical Provider, MD  glimepiride (AMARYL) 2 MG tablet Take 2 mg by mouth daily with breakfast.   Yes Historical Provider, MD  KLOR-CON 10 10 MEQ tablet Take 20 mEq by mouth daily.  10/22/16  Yes Historical Provider, MD  losartan (COZAAR) 50 MG tablet Take 50 mg by mouth daily.   Yes Historical Provider, MD  metFORMIN (GLUCOPHAGE) 500 MG tablet Take 500 mg by mouth 2 (two) times daily with a meal.   Yes Historical Provider, MD  metoprolol succinate (TOPROL-XL) 50 MG 24 hr tablet Take 50 mg by mouth daily. Take with or immediately following a meal.  Yes Historical Provider, MD  mirtazapine (REMERON) 30 MG tablet Take 30 mg by mouth at bedtime.   Yes Historical Provider, MD  risperiDONE (RISPERDAL) 1 MG tablet Take 1.5 mg by mouth at bedtime.    Yes Historical Provider, MD  torsemide (DEMADEX) 5 MG tablet Take 5 mg by mouth daily.   Yes Historical Provider, MD  latanoprost (XALATAN) 0.005 % ophthalmic solution [facility] 05/18/15   Historical Provider, MD  megestrol (MEGACE) 40 MG tablet Take 80 mg by mouth 2 (two) times daily. 12/11/16   Historical Provider, MD    Allergies Patient has no known allergies.  No family history on file.  Social History Social History  Substance Use Topics  . Smoking status: Former Games developermoker  .  Smokeless tobacco: Never Used  . Alcohol use No    Review of Systems Level 5 caveat:  history/ROS limited by acute/critical illness    PHYSICAL EXAM:  VITAL SIGNS: ED Triage Vitals [12/22/16 1504]  Enc Vitals Group     BP (!) 100/40     Pulse Rate (!) 101     Resp 17     Temp 98.2 F (36.8 C)     Temp Source Oral     SpO2 (!) 74 %     Weight      Height      Head Circumference      Peak Flow      Pain Score      Pain Loc      Pain Edu?      Excl. in GC?     Constitutional: Toxic appearing, somnolent but awakens to light touch and soft voice Eyes: Conjunctivae are normal. PERRL. EOMI. Head: Atraumatic. Nose: No congestion/rhinnorhea. Mouth/Throat: Mucous membranes are moist. Neck: No stridor.  No meningeal signs.   Cardiovascular: Borderline tachycardia, regular rhythm. Good peripheral circulation. Grossly normal heart sounds. Respiratory: Normal respiratory effort.  No retractions. Lungs CTAB.  On oxygen after her hypoxemia in triage Gastrointestinal: Soft and nontender. No distention.  GU:  On the nurses were placing a Foley catheter, they called me in.  The patient has extensive rash in her genitourinary region that appears chronic with some discharge and extensive skin changes, but again all of this appears relatively chronic and there is no evidence of acute infection. Musculoskeletal: No lower extremity tenderness nor edema. No gross deformities of extremities. Neurologic:  Normal speech and language. No gross focal neurologic deficits are appreciated.  Skin:  Skin is pale, warm, dry and intact. No rash noted.   ____________________________________________   LABS (all labs ordered are listed, but only abnormal results are displayed)  Labs Reviewed  BASIC METABOLIC PANEL - Abnormal; Notable for the following:       Result Value   BUN 49 (*)    Creatinine, Ser 3.50 (*)    GFR calc non Af Amer 13 (*)    GFR calc Af Amer 15 (*)    All other components within  normal limits  CBC - Abnormal; Notable for the following:    MCHC 30.9 (*)    RDW 19.5 (*)    All other components within normal limits  TROPONIN I - Abnormal; Notable for the following:    Troponin I 0.07 (*)    All other components within normal limits  BRAIN NATRIURETIC PEPTIDE - Abnormal; Notable for the following:    B Natriuretic Peptide 569.0 (*)    All other components within normal  limits  HEPATIC FUNCTION PANEL - Abnormal; Notable for the following:    Albumin 3.2 (*)    Alkaline Phosphatase 37 (*)    All other components within normal limits  BLOOD GAS, ARTERIAL - Abnormal; Notable for the following:    pH, Arterial 7.13 (*)    pCO2 arterial 84 (*)    pO2, Arterial 80 (*)    Acid-base deficit 3.5 (*)    All other components within normal limits  URINALYSIS, ROUTINE W REFLEX MICROSCOPIC - Abnormal; Notable for the following:    Color, Urine AMBER (*)    APPearance CLOUDY (*)    Protein, ur 100 (*)    Leukocytes, UA MODERATE (*)    Bacteria, UA MANY (*)    All other components within normal limits  PROTIME-INR - Abnormal; Notable for the following:    Prothrombin Time 17.1 (*)    All other components within normal limits  BLOOD GAS, ARTERIAL - Abnormal; Notable for the following:    pCO2 arterial 62 (*)    Acid-base deficit 5.6 (*)    All other components within normal limits  OSMOLALITY - Abnormal; Notable for the following:    Osmolality 302 (*)    All other components within normal limits  CULTURE, BLOOD (ROUTINE X 2)  CULTURE, BLOOD (ROUTINE X 2)  URINE CULTURE  CULTURE, RESPIRATORY (NON-EXPECTORATED)  LIPASE, BLOOD  LACTIC ACID, PLASMA  APTT  URINE DRUG SCREEN, QUALITATIVE ([facility] ONLY)  CORTISOL  BASIC METABOLIC PANEL  BLOOD GAS, ARTERIAL  MAGNESIUM  PHOSPHORUS  ETHANOL  SALICYLATE LEVEL  ACETAMINOPHEN LEVEL  TRIGLYCERIDES  TROPONIN I  TROPONIN I  CBC   ____________________________________________  EKG  ED ECG REPORT I, Jamelle Goldston, the  attending physician, personally viewed and interpreted this ECG.  Date: 12/22/2016 EKG Time: 15:07 Rate: 104 Rhythm: Borderline sinus tachycardia QRS Axis: Left axis deviation Intervals: normal ST/T Wave abnormalities: Very slight ST depression in lead aVF with inverted T waves in lead 3.  No reciprocal changes in the lateral leads. Conduction Disturbances: none Narrative Interpretation: Possible acute ischemia but does not meet STEMI criteria  ____________________________________________  RADIOLOGY I, Serria Sloma, personally viewed and evaluated these images (plain radiographs) as part of my medical decision making, as well as reviewing the written report by the radiologist.   Dg Abdomen 1 View  Result Date: 12/22/2016 CLINICAL DATA:  Post intubation an orogastric tube placement. EXAM: ABDOMEN - 1 VIEW COMPARISON:  CT of the abdomen and pelvis 09/16/2010 FINDINGS: Limited view of the abdomen demonstrates enteric catheter projecting over the left midabdomen. Its location however is uncertain as it does not follow the expected curvature of the stomach. Paucity of bowel gas is noted. No evidence of airspace consolidation. IMPRESSION: Enteric catheter overlies the left mid abdomen with uncertain location. Paucity of gas in the upper abdomen prevents confirmation of intragastric placement. Electronically Signed   By: Ted Mcalpine M.D.   On: 12/22/2016 18:13   Dg Chest Portable 1 View  Result Date: 12/22/2016 CLINICAL DATA:  Status post intubation. EXAM: PORTABLE CHEST 1 VIEW COMPARISON:  12/22/2016 at 1510 hours FINDINGS: Endotracheal tube terminates approximately 2.5 cm above the carina. Enteric tube courses into the left upper abdomen with tip not imaged. The cardiac silhouette remains enlarged. Pulmonary vascular congestion, increased interstitial markings, and patchy bibasilar airspace opacities are stable to slightly improved. There may be a small left pleural effusion. No  pneumothorax is identified. IMPRESSION: 1. Endotracheal tube as above. 2. Stable to mildly improved  pulmonary edema. Electronically Signed   By: Sebastian Ache M.D.   On: 12/22/2016 18:08   Dg Chest Portable 1 View  Result Date: 12/22/2016 CLINICAL DATA:  66 year old female with history of hypertension and diabetes presenting with lethargy and weakness intermittently for the past week. Low oxygen saturations. EXAM: PORTABLE CHEST 1 VIEW COMPARISON:  Chest x-ray 09/16/2010. FINDINGS: There is cephalization of the pulmonary vasculature, indistinctness of the interstitial markings, and patchy airspace disease throughout the lungs bilaterally suggestive of moderate pulmonary edema. No pleural effusions. Mild cardiomegaly. The patient is rotated to the left on today's exam, resulting in distortion of the mediastinal contours and reduced diagnostic sensitivity and specificity for mediastinal pathology. Atherosclerosis in the thoracic aorta. IMPRESSION: 1. The appearance of the chest suggests congestive heart failure, as above. 2. Aortic atherosclerosis. Electronically Signed   By: Trudie Reed M.D.   On: 12/22/2016 15:41    ____________________________________________   PROCEDURES  Procedure(s) performed:   .Critical Care Performed by: Loleta Rose Authorized by: Loleta Rose   Critical care provider statement:    Critical care time (minutes):  75   Critical care time was exclusive of:  Separately billable procedures and treating other patients   Critical care was necessary to treat or prevent imminent or life-threatening deterioration of the following conditions:  Respiratory failure and renal failure   Critical care was time spent personally by me on the following activities:  Development of treatment plan with patient or surrogate, discussions with consultants, evaluation of patient's response to treatment, examination of patient, obtaining history from patient or surrogate, ordering and  performing treatments and interventions, ordering and review of laboratory studies, ordering and review of radiographic studies, pulse oximetry, re-evaluation of patient's condition and review of old charts Date/Time: 12/22/2016 5:36 PM Performed by: Loleta Rose Pre-anesthesia Checklist: Emergency Drugs available, Patient being monitored, Patient identified, Suction available and Timeout performed Oxygen Delivery Method: Non-rebreather mask Preoxygenation: Pre-oxygenation with 100% oxygen (Bipap) Intubation Type: Rapid sequence Laryngoscope Size: Glidescope and 4 Tube size: 8.0 mm Number of attempts: 1 Airway Equipment and Method: Patient positioned with wedge pillow Placement Confirmation: ETT inserted through vocal cords under direct vision,  CO2 detector and Breath sounds checked- equal and bilateral Secured at: 23 cm Tube secured with: ETT holder Dental Injury: Teeth and Oropharynx as per pre-operative assessment         Critical Care performed: Yes, see critical care procedure note(s) ____________________________________________   INITIAL IMPRESSION / ASSESSMENT AND PLAN / ED COURSE  Pertinent labs & imaging results that were available during my care of the patient were reviewed by me and considered in my medical decision making (see chart for details).  the patient appears acute ill.  although she appears altered and somnolent, she responds immediately to soft voice and light touch and is alert and oriented, but then she slumps back over and appears minimally responsive again.  Differential is broad but at this point she adamantly denies ever having had any abdominal pain or chest pain.  She denies even having any difficulty breathing in spite of her increased work of breathing and hypoxemia.  We are putting her on the Ventimask and positioned her upright so that she could maintain her oxygenation but I anticipate that she may need BiPAP ion.  She has a history of CHF and does  have what appears to be chronic lower extremity edema so this may be the result of CHF exacerbation which would explain the hypoxemia but she also appears hypercapneic  without a known history of COPD/asthma.  PE is possible, but again, she has no history of chest pain and I do not feel she is safe to go to the CT scanner at this time.   Clinical Course as of Dec 22 1956  Sun Dec 22, 2016  1625 Acute renal failure, acute respiratory failure with hypoxia and hypercapnia.  Starting Bipap to bridge to intubation.  The patient appears altered although she will respond to verbal and light touch stimuli.  I discussed with her and her husband the anticipated need for intubation and they both confirm that she is full code and wants everything done.  There is currently no evidence of an acute infectious process and I am starting a 1 L fluid bolus in spite of her slightly elevated BNP because I believe that she needs the volume.  She also has a slightly elevated troponin but I suspect this is demand ischemia.  Lactic acid is pending.  She has no leukocytosis.  I explained to the husband on 2 separate occasions that I believe that she is critically ill and more ill than she appears or then he realizes and that intubation is likely necessary but we will try on BiPAP at first nothing else to improve her oxygenation prior to intubation attempt.  [CF]  1652 Extensive chronic-appearing wounds in and around her pelvic/pubic region.  Difficult to appreciate if these is an acute component.  Will treat empirically with antibiotics given that patient continues to decline.  While supine getting Foley, the patient's sats dropped to 88% on Bipap at 40%.  I increased to 50%, and the patient did not improve.  I increased to 100% to preoxygenate prior to intubation.  Updated husband again as to just how critical the patient is at this time.  [CF]  1709 Lactate 1, again less likely infectious process  [CF]  1744 Intubation successful.   Spoke by phone with ICU physician, explained situation, he will send the local ICU physician (Dr. Lonn Georgia) to evaluate in person.  Agreed with my management thus far.  Again, does not appear septic, but that is still possible.  Continuing fluids and started dopamine for hypotension (do not have a fed in the emergency department readily available anymore).  [CF]    Clinical Course User Index [CF] Loleta Rose, MD    ____________________________________________  FINAL CLINICAL IMPRESSION(S) / ED DIAGNOSES  Final diagnoses:  Acute respiratory failure with hypoxia and hypercapnia (HCC)  Acute renal failure, unspecified acute renal failure type (HCC)  Elevated troponin I level  Demand ischemia (HCC)  Hypotension, unspecified hypotension type  Sepsis, due to unspecified organism [facility])  Urinary tract infection without hematuria, site unspecified     MEDICATIONS GIVEN DURING THIS VISIT:  Medications  fentaNYL in NS (88mcg/ml) infusion-PREMIX (0 mcg/hr Intravenous Hold 12/22/16 1750)  norepinephrine (LEVOPHED) 4 mg in dextrose 5 % 250 mL (0.016 mg/mL) infusion (0 mcg/min Intravenous Hold 12/22/16 1840)  ketamine 100 mg in normal saline 10 mL (10mg /mL) syringe (250 mg Intravenous Not Given 12/22/16 1745)  0.9 %  sodium chloride infusion (not administered)  aspirin chewable tablet 324 mg (not administered)    Or  aspirin suppository 300 mg (not administered)  heparin injection 5,000 Units (not administered)  famotidine (PEPCID) IVPB 20 mg premix (not administered)  phenylephrine (NEO-SYNEPHRINE) 10 mg in sodium chloride 0.9 % 250 mL (0.04 mg/mL) infusion (not administered)  propofol (DIPRIVAN) 1000 MG/100ML infusion (not administered)  DOPamine (INTROPIN) 800 mg in dextrose  5 % 250 mL (3.2 mg/mL) infusion (2 mcg/kg/min  125 kg Intravenous Transfusing/Transfer 12/22/16 1944)  vancomycin (VANCOCIN) 1,250 mg in sodium chloride 0.9 % 250 mL IVPB (not administered)    piperacillin-tazobactam (ZOSYN) IVPB 3.375 g (not administered)  sodium chloride 0.9 % bolus 1,000 mL (0 mLs Intravenous Stopped 12/22/16 1920)  piperacillin-tazobactam (ZOSYN) IVPB 3.375 g (0 g Intravenous Stopped 12/22/16 1748)  vancomycin (VANCOCIN) IVPB 1000 mg/200 mL premix (0 mg Intravenous Stopped 12/22/16 1841)  sodium chloride 0.9 % bolus 1,000 mL (0 mLs Intravenous Stopped 12/22/16 1747)  ketamine (KETALAR) injection 250 mg (200 mg Intravenous Given by Other 12/22/16 1726)  succinylcholine (ANECTINE) injection 150 mg (150 mg Intravenous Given by Other 12/22/16 1728)  propofol (DIPRIVAN) 1000 MG/100ML infusion (5 mcg/kg/min  82.8 kg (Adjusted) Intravenous Transfusing/Transfer 12/22/16 1944)  DOPamine (INTROPIN) 800 mg in dextrose 5 % 250 mL (3.2 mg/mL) infusion (0 mcg/kg/min  125 kg Intravenous Stopped 12/22/16 1847)     NEW OUTPATIENT MEDICATIONS STARTED DURING THIS VISIT:  New Prescriptions   No medications on file    Modified Medications   No medications on file    Discontinued Medications   POTASSIUM CHLORIDE (K-DUR,KLOR-CON) 10 MEQ TABLET    Take 10 mEq by mouth daily.     Note:  This document was prepared using Dragon voice recognition software and may include unintentional dictation errors.    Loleta Rose, MD 12/22/16 1958

## 2016-12-22 NOTE — Procedures (Signed)
Central Venous Catheter Insertion Procedure Note Jasmine Osborn [id number] 09/17/1951  Procedure: Insertion of Left internal jugular Central Venous Catheter Indications: Assessment of intravascular volume, Drug and/or fluid administration and Frequent blood sampling  Procedure Details Consent: Risks of procedure as well as the alternatives and risks of each were explained to the (patient/caregiver).  Consent for procedure obtained. and Unable to obtain consent because of emergent medical necessity. Time Out: Verified patient identification, verified procedure, site/side was marked, verified correct patient position, special equipment/implants available, medications/allergies/relevent history reviewed, required imaging and test results available.  Performed  Maximum sterile technique was used including antiseptics, cap, gloves, gown, hand hygiene, mask and sheet. Skin prep: Chlorhexidine; local anesthetic administered A antimicrobial bonded/coated triple lumen catheter was placed in the left internal jugular vein using the Seldinger technique.  Evaluation Blood flow good Complications: No apparent complications Patient did tolerate procedure well. Chest X-ray ordered to verify placement.  CXR: normal.  Procedure performed under direct supervision of Dr.Conforti. Ultrasound utilized for realtime vessel cannulation  Jasmine Osborn S. [facility] ANP-BC Pulmonary and Critical Care Medicine [facility] Pager [phone] or [phone] 12/22/2016, 9:43 PM

## 2016-12-22 NOTE — ED Notes (Signed)
Dr York Ceriseforbach notified ph [phone]

## 2016-12-22 NOTE — Progress Notes (Addendum)
Pharmacy Antibiotic Note  Dyane DustmanDarlene O Stribling is a 66 y.o. female admitted on 12/22/2016 with sepsis.  Pharmacy has been consulted for cefepime and vancomycin dosing.  Plan: 1. Cefepime 1 gm IV Q24H 2. Vancomycin 1 gm IV x 1 followed in 6 hours (stacked dosing) by vancomycin 1.25 gm IV Q36H, predicted trough 18 mcg/mL. Pharmacy will continue to follow and adjust as needed to maintain trough 15 to 20 mcg/mL.   Vd 59 L, Ke 0.022 hr-1, T1/2 31.7 hr  Height: 5' 4.02" (162.6 cm) Weight: 284 lb 14.4 oz (129.2 kg) IBW/kg (Calculated) : 54.74  Temp (24hrs), Avg:98.2 F (36.8 C), Min:98.2 F (36.8 C), Max:98.2 F (36.8 C)   Recent Labs Lab 12/22/16 1510 12/22/16 1546  WBC 10.8  --   CREATININE 3.50*  --   LATICACIDVEN  --  1.0    Estimated Creatinine Clearance: 21.1 mL/min (A) (by C-G formula based on SCr of 3.5 mg/dL (H)).    No Known Allergies  Thank you for allowing pharmacy to be a part of this patient's care.  Carola FrostNathan A Napoleon Monacelli, Pharm.D., BCPS Clinical Pharmacist 12/22/2016 7:43 PM

## 2016-12-22 NOTE — H&P (Addendum)
[facility]    ASSESSMENT/PLAN   Respiratory failure. Acute hypercapnic respiratory failure requiring intubation. Suspect patient has underlying obstructive sleep apnea/obesity hypoventilation syndrome with decompensation. This however is undiagnosed. Patient does not have any history of COPD. Patient has been intubated, will obtain urine drug screen, salicylate, ethanol, acetaminophen levels, CT scan of head without contrast. Will admit to the intensive care unit, placed on mechanical ventilation protocol, postintubation chest x-ray and arterial blood gas.  Shock. Patient has urinary tract infection and this may be sepsis, will start on broad-spectrum antibiotics to include vancomycin and Zosyn along with blood urine and sputum culture. This may also reflect positive pressure ventilation and decrease right ventricular preload. We'll empirically  fluid resuscitation at this point and is presently on dopamine. Will check random cortisol level When she arrives in intensive care unit if dopamine requirement persists will require central venous access placement, measure CVP, fluid resuscitation, since she is tachycardic will change pressors to Neo-Synephrine.  Elevated troponin. No acute ischemic changes noted on EKG, most likely reflects supply demand ischemia, will obtain serial cardiac enzymes, EKG, echocardiography  Acute renal failure. Urine creatinine is 49 of 3.5. Foley catheter has been placed, will obtain renal ultrasound, support hemodynamics, avoid nephrotoxic agents  Critical care time spent is 40 minutes   Name: [name] MRN: [id number] DOB: 05/11/1951    ADMISSION DATE:  12/22/2016  CHIEF COMPLAINT:  Lethargy and shortness of breath   HISTORY OF PRESENT ILLNESS:  Jasmine Osborn is a 66 year old Caucasian female with a past medical history remarkable for hypertension, hyperlipidemia, diabetes, depression, peripheral vascular disease, abdominal  ventral hernia with complications of mesh infection, was brought into the emergency department today secondary to lethargy throughout the week. Husband states that she has been sleeping frequently. She has also had some worsening shortness of breath, he states that she has been feeling cool to the touch, denies any fever, chills, night sweats or sputum production. Upon presentation to the emergency department she was noted to be lethargic, arterial blood gas revealed a pH 7.13, PCO2 of 84, PaO2 of 80. Other pertinent labs include a BMP of 569, BUN 49, creatinine 3.5, CO2 of 25 on BMP, troponin of 0.07, EKG does not reveal any acute ischemic changes, chest x-ray revealed cardiomegaly with vascular congestion widening of the vascular pedicle width, urinalysis was positive for leukocytes and many bacteria. Unfortunately her respiratory status deteriorated requiring intubation. She also has had transient hypotension and is now presently on dopamine infusion per emergency department.  PAST MEDICAL HISTORY :  Past Medical History:  Diagnosis Date  . Depression   . Diabetes mellitus without complication (HCC)   . Elevated lipids   . Hernia of abdominal wall   . Hypertension   . Psychosis   . PVD (peripheral vascular disease) (HCC)    heel ulcer Lt    Past Surgical History:  Procedure Laterality Date  . COLON SURGERY    . DILATION AND CURETTAGE OF UTERUS    . HERNIA REPAIR    . TIBIA FRACTURE SURGERY Left    Prior to Admission medications   Medication Sig Start Date End Date Taking? Authorizing Provider  amLODipine (NORVASC) 5 MG tablet Take 5 mg by mouth daily.   Yes Historical Provider, MD  aspirin 81 MG tablet Take 81 mg by mouth daily.   Yes Historical Provider, MD  fenofibrate 160 MG tablet Take 160 mg by mouth daily.    Yes Historical Provider, MD  glimepiride (AMARYL) 2 MG tablet Take 2 mg by mouth daily with breakfast.   Yes Historical Provider, MD  KLOR-CON 10 10 MEQ tablet Take 20 mEq by  mouth daily.  10/22/16  Yes Historical Provider, MD  losartan (COZAAR) 50 MG tablet Take 50 mg by mouth daily.   Yes Historical Provider, MD  metFORMIN (GLUCOPHAGE) 500 MG tablet Take 500 mg by mouth 2 (two) times daily with a meal.   Yes Historical Provider, MD  metoprolol succinate (TOPROL-XL) 50 MG 24 hr tablet Take 50 mg by mouth daily. Take with or immediately following a meal.   Yes Historical Provider, MD  mirtazapine (REMERON) 30 MG tablet Take 30 mg by mouth at bedtime.   Yes Historical Provider, MD  risperiDONE (RISPERDAL) 1 MG tablet Take 1.5 mg by mouth at bedtime.    Yes Historical Provider, MD  torsemide (DEMADEX) 5 MG tablet Take 5 mg by mouth daily.   Yes Historical Provider, MD  latanoprost (XALATAN) 0.005 % ophthalmic solution USE [facility] AT [facility] 05/18/15   Historical Provider, MD  megestrol (MEGACE) 40 MG tablet Take 80 mg by mouth 2 (two) times daily. 12/11/16   Historical Provider, MD   No Known Allergies  FAMILY HISTORY:  No family history on file. SOCIAL HISTORY:  reports that she has quit smoking. She has never used smokeless tobacco. She reports that she does not drink alcohol or use drugs.  REVIEW OF SYSTEMS:   Unable to be obtained and secondary to patient intubated  VITAL SIGNS: Patient is presently breathing 26 times a minute on mechanical ventilation, orally intubated, NG tube in place, heart rate is 110 sinus mechanism, blood pressure is 90/60 presently on 10 mics of dopamine   VENTILATOR SETTINGS: FiO2 (%):  [40 %-100 %] 100 % INTAKE / OUTPUT: No intake or output data in the 24 hours ending 12/22/16 1807  Physical Examination:   VS: BP (!) 59/27   Pulse 82   Temp 98.2 F (36.8 C) (Oral)   Resp 17   Ht 5' 4.02" (1.626 m)   Wt 125 kg (275 lb 9.2 oz)   SpO2 97%   BMI 47.28 kg/m   General Appearance: Recently intubated, coughing, in respiratory distress, awake and tracking Neuro: Limited exam, patient has just received ketamine and  succinylcholine for rapid sequence intubation HEENT: PERRLA, EOM intact, orally intubated, trachea is midline, no stridor appreciated, jugular venous distention is difficult to assess secondary to body habitus Pulmonary: Coarse expiratory rhonchi appreciated, and territory crackles appreciated bilateral posteriorly Cardiovascular sinus tachycardia noted on telemetry, S1-S2, distant heart sounds Abdomen: Obese, positive bowel sounds noted, large ventral hernia that is reducible Endoc: No evident thyromegaly Skin:   warm, no rashes, no ecchymosis  Musculoskeletal: normal, no cyanosis, clubbing, 3+ edema noted cool lower extremities   LABS: Reviewed   LABORATORY PANEL:   CBC  Recent Labs Lab 12/22/16 1510  WBC 10.8  HGB 13.5  HCT 43.6  PLT 193    Chemistries   Recent Labs Lab 12/22/16 1510  NA 136  K 4.4  CL 104  CO2 25  GLUCOSE 79  BUN 49*  CREATININE 3.50*  CALCIUM 9.2  AST 17  ALT 18  ALKPHOS 37*  BILITOT 1.0    No results for input(s): GLUCAP in the last 168 hours.  Recent Labs Lab 12/22/16 1549  PHART 7.13*  PCO2ART 84*  PO2ART 80*    Recent Labs Lab 12/22/16 1510  AST 17  ALT 18  ALKPHOS 37*  BILITOT 1.0  ALBUMIN 3.2*    Cardiac Enzymes  Recent Labs Lab 12/22/16 1510  TROPONINI 0.07*    RADIOLOGY:  Dg Chest Portable 1 View  Result Date: 12/22/2016 CLINICAL DATA:  66 year old female with history of hypertension and diabetes presenting with lethargy and weakness intermittently for the past week. Low oxygen saturations. EXAM: PORTABLE CHEST 1 VIEW COMPARISON:  Chest x-ray 09/16/2010. FINDINGS: There is cephalization of the pulmonary vasculature, indistinctness of the interstitial markings, and patchy airspace disease throughout the lungs bilaterally suggestive of moderate pulmonary edema. No pleural effusions. Mild cardiomegaly. The patient is rotated to the left on today's exam, resulting in distortion of the mediastinal contours and  reduced diagnostic sensitivity and specificity for mediastinal pathology. Atherosclerosis in the thoracic aorta. IMPRESSION: 1. The appearance of the chest suggests congestive heart failure, as above. 2. Aortic atherosclerosis. Electronically Signed   By: Trudie Reed M.D.   On: 12/22/2016 15:41    Tora Kindred, DO  12/22/2016, 6:07 PM

## 2016-12-23 ENCOUNTER — Inpatient Hospital Stay (HOSPITAL_COMMUNITY)
Admit: 2016-12-23 | Discharge: 2016-12-23 | Disposition: A | Discharge status: Home / Self Care | Payer: 59 | Attending: Internal Medicine | Admitting: Internal Medicine

## 2016-12-23 ENCOUNTER — Inpatient Hospital Stay: Payer: 59

## 2016-12-23 DIAGNOSIS — R0600 Dyspnea, unspecified: Secondary | ICD-10-CM

## 2016-12-23 DIAGNOSIS — J9622 Acute and chronic respiratory failure with hypercapnia: Secondary | ICD-10-CM

## 2016-12-23 DIAGNOSIS — A419 Sepsis, unspecified organism: Secondary | ICD-10-CM

## 2016-12-23 DIAGNOSIS — R6521 Severe sepsis with septic shock: Secondary | ICD-10-CM

## 2016-12-23 DIAGNOSIS — L8990 Pressure ulcer of unspecified site, unspecified stage: Secondary | ICD-10-CM | POA: Insufficient documentation

## 2016-12-23 DIAGNOSIS — L03314 Cellulitis of groin: Secondary | ICD-10-CM

## 2016-12-23 LAB — BLOOD GAS, ARTERIAL
Acid-base deficit: 4.3 mmol/L — ABNORMAL HIGH (ref 0.0–2.0)
BICARBONATE: 19 mmol/L — AB (ref 20.0–28.0)
FIO2: 0.45
MECHVT: 500 mL
O2 Saturation: 98.1 %
PATIENT TEMPERATURE: 37
PEEP/CPAP: 8 cmH2O
PH ART: 7.41 (ref 7.350–7.450)
PO2 ART: 106 mmHg (ref 83.0–108.0)
RATE: 25 resp/min
pCO2 arterial: 30 mmHg — ABNORMAL LOW (ref 32.0–48.0)

## 2016-12-23 LAB — TROPONIN I
TROPONIN I: 0.17 ng/mL — AB (ref <0.03)
Troponin I: 0.23 ng/mL (ref <0.03)

## 2016-12-23 LAB — BASIC METABOLIC PANEL
Anion gap: 12 (ref 5–15)
BUN: 44 mg/dL — AB (ref 6–20)
CHLORIDE: 105 mmol/L (ref 101–111)
CO2: 19 mmol/L — AB (ref 22–32)
CREATININE: 2.28 mg/dL — AB (ref 0.44–1.00)
Calcium: 8.8 mg/dL — ABNORMAL LOW (ref 8.9–10.3)
GFR calc non Af Amer: 21 mL/min — ABNORMAL LOW (ref >60)
GFR, EST AFRICAN AMERICAN: 25 mL/min — AB (ref >60)
GLUCOSE: 144 mg/dL — AB (ref 65–99)
Potassium: 3.4 mmol/L — ABNORMAL LOW (ref 3.5–5.1)
Sodium: 136 mmol/L (ref 135–145)

## 2016-12-23 LAB — PROCALCITONIN: Procalcitonin: 0.3 ng/mL

## 2016-12-23 LAB — GLUCOSE, CAPILLARY
GLUCOSE-CAPILLARY: 167 mg/dL — AB (ref 65–99)
GLUCOSE-CAPILLARY: 200 mg/dL — AB (ref 65–99)
GLUCOSE-CAPILLARY: 242 mg/dL — AB (ref 65–99)
GLUCOSE-CAPILLARY: 185 mg/dL — AB (ref 65–99)
GLUCOSE-CAPILLARY: 143 mg/dL — AB (ref 65–99)
Glucose-Capillary: 149 mg/dL — ABNORMAL HIGH (ref 65–99)
Glucose-Capillary: 174 mg/dL — ABNORMAL HIGH (ref 65–99)

## 2016-12-23 LAB — LACTIC ACID, PLASMA: Lactic Acid, Venous: 1 mmol/L (ref 0.5–1.9)

## 2016-12-23 LAB — PHOSPHORUS: Phosphorus: 2.9 mg/dL (ref 2.5–4.6)

## 2016-12-23 LAB — CORTISOL: CORTISOL PLASMA: 27.9 ug/dL

## 2016-12-23 LAB — ECHOCARDIOGRAM COMPLETE
HEIGHTINCHES: 64.016 in
Weight: 4500.91 oz

## 2016-12-23 LAB — MAGNESIUM: MAGNESIUM: 1.7 mg/dL (ref 1.7–2.4)

## 2016-12-23 MED ORDER — SENNOSIDES 8.8 MG/5ML PO SYRP
5.0000 mL | ORAL_SOLUTION | Freq: Two times a day (BID) | ORAL | Status: DC
  Administered 2016-12-23 – 2016-12-26 (×5): 5 mL
  Filled 2016-12-23 (×4): qty 5

## 2016-12-23 MED ORDER — MAGNESIUM SULFATE 2 GM/50ML IV SOLN
2.0000 g | Freq: Once | INTRAVENOUS | Status: AC
  Administered 2016-12-23: 2 g via INTRAVENOUS
  Filled 2016-12-23: qty 50

## 2016-12-23 MED ORDER — DEXTROSE 5 % IV SOLN
0.0000 ug/min | INTRAVENOUS | Status: DC
  Administered 2016-12-23: 30 ug/min via INTRAVENOUS
  Administered 2016-12-26: 5 ug/min via INTRAVENOUS
  Filled 2016-12-23 (×4): qty 16

## 2016-12-23 MED ORDER — ORAL CARE MOUTH RINSE
15.0000 mL | Freq: Two times a day (BID) | OROMUCOSAL | Status: DC
  Administered 2016-12-23 (×2): 15 mL via OROMUCOSAL

## 2016-12-23 MED ORDER — DOCUSATE SODIUM 50 MG/5ML PO LIQD
100.0000 mg | Freq: Two times a day (BID) | ORAL | Status: DC
  Administered 2016-12-23 – 2016-12-26 (×5): 100 mg
  Filled 2016-12-23 (×5): qty 10

## 2016-12-23 MED ORDER — POTASSIUM PHOSPHATES 15 MMOLE/5ML IV SOLN
30.0000 mmol | Freq: Once | INTRAVENOUS | Status: AC
  Administered 2016-12-23: 30 mmol via INTRAVENOUS
  Filled 2016-12-23: qty 10

## 2016-12-23 MED ORDER — CHLORHEXIDINE GLUCONATE 0.12 % MT SOLN
15.0000 mL | Freq: Two times a day (BID) | OROMUCOSAL | Status: DC
  Administered 2016-12-23 – 2017-01-03 (×23): 15 mL via OROMUCOSAL
  Filled 2016-12-23 (×18): qty 15

## 2016-12-23 MED ORDER — PRO-STAT SUGAR FREE PO LIQD
30.0000 mL | Freq: Every day | ORAL | Status: DC
  Administered 2016-12-23 – 2016-12-25 (×11): 30 mL via ORAL

## 2016-12-23 MED ORDER — DEXTROSE 5 % IV SOLN
2.0000 g | Freq: Two times a day (BID) | INTRAVENOUS | Status: DC
  Administered 2016-12-23 – 2016-12-25 (×4): 2 g via INTRAVENOUS
  Filled 2016-12-23 (×7): qty 2

## 2016-12-23 MED ORDER — RISPERIDONE 0.5 MG PO TABS
1.0000 mg | ORAL_TABLET | Freq: Every day | ORAL | Status: DC
  Administered 2016-12-23 – 2017-01-02 (×11): 1 mg via ORAL
  Filled 2016-12-23 (×11): qty 2

## 2016-12-23 MED ORDER — SODIUM CHLORIDE 0.9 % IV SOLN
0.0300 [IU]/min | INTRAVENOUS | Status: DC
  Filled 2016-12-23: qty 2

## 2016-12-23 MED ORDER — NOREPINEPHRINE 4 MG/250ML-% IV SOLN
INTRAVENOUS | Status: AC
  Filled 2016-12-23: qty 250

## 2016-12-23 MED ORDER — SODIUM CHLORIDE 0.9 % IV SOLN
0.0000 ug/min | INTRAVENOUS | Status: DC
  Administered 2016-12-23: 20 ug/min via INTRAVENOUS
  Filled 2016-12-23 (×2): qty 4

## 2016-12-23 MED ORDER — FAMOTIDINE IN NACL 20-0.9 MG/50ML-% IV SOLN
20.0000 mg | INTRAVENOUS | Status: DC
  Administered 2016-12-24: 20 mg via INTRAVENOUS
  Filled 2016-12-23: qty 50

## 2016-12-23 MED ORDER — LATANOPROST 0.005 % OP SOLN
1.0000 [drp] | Freq: Every day | OPHTHALMIC | Status: DC
  Administered 2016-12-23 – 2017-01-02 (×11): 1 [drp] via OPHTHALMIC
  Filled 2016-12-23: qty 2.5

## 2016-12-23 MED ORDER — VITAL HIGH PROTEIN PO LIQD
1000.0000 mL | ORAL | Status: DC
  Administered 2016-12-23 – 2016-12-24 (×2): 1000 mL
  Administered 2016-12-24 – 2016-12-25 (×4)

## 2016-12-23 MED ORDER — ORAL CARE MOUTH RINSE
15.0000 mL | OROMUCOSAL | Status: DC
  Administered 2016-12-23 – 2016-12-30 (×47): 15 mL via OROMUCOSAL

## 2016-12-23 NOTE — Progress Notes (Signed)
Pharmacy Antibiotic Note  Jasmine Osborn is a 66 y.o. female admitted on 12/22/2016 with sepsis.  Pharmacy has been consulted for cefepime and vancomycin dosing.  Plan: 1. Will increase cefepime to 2 g iv q 12 hours.  2. Will continue vancomycin 1250 mg iv q 36 hours for now and consider adjusting dose 3/20 depending on SCr.   Height: 5' 4.02" (162.6 cm) Weight: 281 lb 4.9 oz (127.6 kg) IBW/kg (Calculated) : 54.74  Temp (24hrs), Avg:98.5 F (36.9 C), Min:98.2 F (36.8 C), Max:99.2 F (37.3 C)   Recent Labs Lab 12/22/16 0313 12/22/16 1510 12/22/16 1546 12/22/16 1930 12/23/16 0313  WBC  --  10.8  --  15.5*  --   CREATININE  --  3.50*  --   --  2.28*  LATICACIDVEN 1.0  --  1.0  --   --     Estimated Creatinine Clearance: 32.1 mL/min (A) (by C-G formula based on SCr of 2.28 mg/dL (H)).    No Known Allergies  Thank you for allowing pharmacy to be a part of this patient's care.  [name], [name], Pharm.D., BCPS Clinical Pharmacist 12/23/2016 12:58 PM

## 2016-12-23 NOTE — Progress Notes (Signed)
*  PRELIMINARY RESULTS* Echocardiogram 2D Echocardiogram has been performed.  Cristela BlueHege, Janell Keeling 12/23/2016, 11:33 AM

## 2016-12-23 NOTE — Progress Notes (Signed)
eLink Physician-Brief Progress Note Patient Name: Jasmine DustmanDarlene O Osborn DOB: 04/27/1951 MRN: [id number]   Date of Service  12/23/2016  HPI/Events of Note  RN calls as pt's BP has slowly dropped throughout the day and is on higher levophed requirement now.  Pt being treated as septic shock.  Currently on levophed at 40 mcg/kg/min.   eICU Interventions  Will add neosynpehrine drip     Intervention Category Major Interventions: Hypotension - evaluation and management  Louann SjogrenJose Angelo A De Dios 12/23/2016, 8:31 PM

## 2016-12-23 NOTE — Progress Notes (Signed)
[facility] Progess Note    ASSESSMENT/PLAN   66 yo female with Perineal cellulitis/UTI severe septic shock, CHF with pulmonary edema, AKI,  intubated.   PULMONARY A:Acute hypoxic respiratory failure, vent dependent.  Currently on PRVC 16/500/5/45% 7.41/30/106/19-- will decrease vent rate.  CT and CXR images reviewed;  Pulmonary edema and Reduced lung volumes due to obesity.  P:   Continue pressors, conservative fluids.  Wean down pressors as tolerated.  Decrease vent rate.   CARDIOVASCULAR A: Septic shock.  P:  Continue treatment of source.   RENAL A:  AKI P:   Monitor UOP  GASTROINTESTINAL A:  Tube feeds.  P:     HEMATOLOGIC A:  Leukocytosis likely secondary to UTI with sepsis.  P:  --  INFECTIOUS A:  UTI/Perineal cellulitis with severe septic shock.  P:   Wound care consulted.   Micro/culture results:  BCx2 3/18; negative.  UC 3/18; pending.  Sputum -- MRSA screen 12/22/16; negative.   Antibiotics:   ENDOCRINE A:  SSI.  P:     NEUROLOGIC A:  Metabolic encephalopathy.  P:   Continue propofol for sedation, wean down fentanyl.  --Restart home risperdal.    MAJOR EVENTS/TEST RESULTS:   Best Practices  DVT Prophylaxis: heparin GI Prophylaxis: famotidine.    ---------------------------------------   ----------------------------------------   Name: Jasmine Osborn MRN: [id number] DOB: 01/07/1951    ADMISSION DATE:  12/22/2016    SUBJECTIVE:   Pt currently on the ventilator, can not provide history or review of systems.   Review of Systems:  --   VITAL SIGNS: Temp:  [98.2 F (36.8 C)-99.2 F (37.3 C)] 98.2 F (36.8 C) (03/19 0500) Pulse Rate:  [82-132] 89 (03/19 1100) Resp:  [0-37] 0 (03/19 1100) BP: (59-130)/(27-85) 116/46 (03/19 1100) SpO2:  [74 %-100 %] 93 % (03/19 1100) FiO2 (%):  [40 %-100 %] 45 % (03/19 1138) Weight:  [275 lb 9.2 oz (125 kg)-284 lb 14.4 oz (129.2 kg)] 281 lb 4.9 oz  (127.6 kg) (03/19 0500) HEMODYNAMICS:   VENTILATOR SETTINGS: Vent Mode: PRVC FiO2 (%):  [40 %-100 %] 45 % Set Rate:  [16 bmp-25 bmp] 16 bmp Vt Set:  [500 mL] 500 mL PEEP:  [5 cmH20-8 cmH20] 5 cmH20 Plateau Pressure:  [22 cmH20] 22 cmH20 INTAKE / OUTPUT:  Intake/Output Summary (Last 24 hours) at 12/23/16 1233 Last data filed at 12/23/16 1100  Gross per 24 hour  Intake          4932.36 ml  Output             3325 ml  Net          1607.36 ml    PHYSICAL EXAMINATION: Physical Examination:   VS: BP (!) 116/46   Pulse 89   Temp 98.2 F (36.8 C) (Oral)   Resp (!) 0   Ht 5' 4.02" (1.626 m)   Wt 281 lb 4.9 oz (127.6 kg)   SpO2 93%   BMI 48.26 kg/m   General Appearance: No distress  Neuro:without focal findings, mental status reduced.  HEENT: PERRLA, EOM intact. Pulmonary: decreased breath sounds bilaterally.  CardiovascularNormal S1,S2.  No m/r/g.   Abdomen: Benign, Soft, non-tender. Renal:  No costovertebral tenderness  GU:  Not performed at this time. Endocrine: No evident thyromegaly. Skin:   warm, no rashes, no ecchymosis  Extremities: normal, no cyanosis, clubbing.   LABS:   LABORATORY PANEL:   CBC  Recent Labs Lab 12/22/16 1930  WBC 15.5*  HGB  14.0  HCT 45.1  PLT 237    Chemistries   Recent Labs Lab 12/22/16 1510  12/23/16 0313  NA 136  --  136  K 4.4  --  3.4*  CL 104  --  105  CO2 25  --  19*  GLUCOSE 79  --  144*  BUN 49*  --  44*  CREATININE 3.50*  --  2.28*  CALCIUM 9.2  --  8.8*  MG  --   < > 1.7  PHOS  --   < > 2.9  AST 17  --   --   ALT 18  --   --   ALKPHOS 37*  --   --   BILITOT 1.0  --   --   < > = values in this interval not displayed.   Recent Labs Lab 12/22/16 2212 12/22/16 2358 12/23/16 0349 12/23/16 0835 12/23/16 1127 12/23/16 1159  GLUCAP 148* 150* 149* 167* 200* 242*    Recent Labs Lab 12/22/16 1823 12/22/16 2130 12/23/16 0500  PHART 7.19* 7.38 7.41  PCO2ART 62* 35 30*  PO2ART 78* 112* 106     Recent Labs Lab 12/22/16 1510  AST 17  ALT 18  ALKPHOS 37*  BILITOT 1.0  ALBUMIN 3.2*    Cardiac Enzymes  Recent Labs Lab 12/23/16 0313  TROPONINI 0.23*    RADIOLOGY:  Dg Abdomen 1 View  Result Date: 12/22/2016 CLINICAL DATA:  Post intubation an orogastric tube placement. EXAM: ABDOMEN - 1 VIEW COMPARISON:  CT of the abdomen and pelvis 09/16/2010 FINDINGS: Limited view of the abdomen demonstrates enteric catheter projecting over the left midabdomen. Its location however is uncertain as it does not follow the expected curvature of the stomach. Paucity of bowel gas is noted. No evidence of airspace consolidation. IMPRESSION: Enteric catheter overlies the left mid abdomen with uncertain location. Paucity of gas in the upper abdomen prevents confirmation of intragastric placement. Electronically Signed   By: Ted Mcalpine M.D.   On: 12/22/2016 18:13   Ct Head Wo Contrast  Result Date: 12/22/2016 CLINICAL DATA:  Per husband pt has been lethargic and weak on and off for the past week. Pt intubated EXAM: CT HEAD WITHOUT CONTRAST TECHNIQUE: Contiguous axial images were obtained from the base of the skull through the vertex without intravenous contrast. COMPARISON:  None. FINDINGS: Brain: No acute intracranial hemorrhage. No focal mass lesion. No CT evidence of acute infarction. No midline shift or mass effect. No hydrocephalus. Basilar cisterns are patent. Vascular: No hyperdense vessel or unexpected calcification. Skull: Normal. Negative for fracture or focal lesion. Sinuses/Orbits: Coastal thickening in the maxillary sinuses. Opacification ethmoid air cells. Other: Intubated patient IMPRESSION: 1. No acute intracranial findings. 2. Maxillary and ethmoid sinus inflammation. Electronically Signed   By: Genevive Bi M.D.   On: 12/22/2016 20:19   US Renal  Result Date: 12/23/2016 CLINICAL DATA:  Renal failure EXAM: RENAL / URINARY TRACT ULTRASOUND COMPLETE COMPARISON:  CT abdomen  09/16/2010 FINDINGS: Right Kidney: Length: 12.1 cm. Question trace perinephric fluid. Increased echogenicity of the renal cortex. No renal obstruction or mass. Left Kidney: Length: 11.8 cm. Left kidney difficult image due to body habitus. No renal mass or obstruction. Increased cortical echogenicity. Bladder: Foley catheter.  Urinary bladder empty. Incidental note of gallstones. IMPRESSION: Increased cortical echogenicity bilaterally.  No renal obstruction Cholelithiasis Electronically Signed   By: Marlan Palau M.D.   On: 12/23/2016 11:26   Dg Chest Port 1 View  Result Date: 12/23/2016 CLINICAL DATA:  Respiratory failure. EXAM: PORTABLE CHEST 1 VIEW COMPARISON:  12/22/2016. FINDINGS: Endotracheal tube, IJ line, NG tube in stable position. Cardiomegaly with pulmonary vascular congestion. Bilateral pulmonary infiltrates and pleural effusions. Findings consistent CHF. Left base atelectasis. No pneumothorax . IMPRESSION: 1. Lines and tubes in stable position. 2. Cardiomegaly with pulmonary venous congestion bilateral pulmonary infiltrates and pleural effusions consistent with CHF. 3.  Left base atelectasis . Electronically Signed   By: Maisie Fus  Register   On: 12/23/2016 06:45   Dg Chest Port 1 View  Result Date: 12/22/2016 CLINICAL DATA:  Central line placement EXAM: PORTABLE CHEST 1 VIEW COMPARISON:  Chest radiograph 12/22/2016 at 5:26 p.m. FINDINGS: Unchanged position of endotracheal tube tip just below the level of the clavicles. Left internal jugular vein approach central venous catheter tip is in the lower SVC. Orogastric tube courses beyond the field of view. Cardiomegaly and bibasilar linear opacities are unchanged. Small left pleural effusion persists. IMPRESSION: Left IJ central venous catheter tip in the lower SVC. Electronically Signed   By: Deatra Robinson M.D.   On: 12/22/2016 21:49   Dg Chest Portable 1 View  Result Date: 12/22/2016 CLINICAL DATA:  Status post intubation. EXAM: PORTABLE CHEST 1  VIEW COMPARISON:  12/22/2016 at 1510 hours FINDINGS: Endotracheal tube terminates approximately 2.5 cm above the carina. Enteric tube courses into the left upper abdomen with tip not imaged. The cardiac silhouette remains enlarged. Pulmonary vascular congestion, increased interstitial markings, and patchy bibasilar airspace opacities are stable to slightly improved. There may be a small left pleural effusion. No pneumothorax is identified. IMPRESSION: 1. Endotracheal tube as above. 2. Stable to mildly improved pulmonary edema. Electronically Signed   By: Sebastian Ache M.D.   On: 12/22/2016 18:08   Dg Chest Portable 1 View  Result Date: 12/22/2016 CLINICAL DATA:  66 year old female with history of hypertension and diabetes presenting with lethargy and weakness intermittently for the past week. Low oxygen saturations. EXAM: PORTABLE CHEST 1 VIEW COMPARISON:  Chest x-ray 09/16/2010. FINDINGS: There is cephalization of the pulmonary vasculature, indistinctness of the interstitial markings, and patchy airspace disease throughout the lungs bilaterally suggestive of moderate pulmonary edema. No pleural effusions. Mild cardiomegaly. The patient is rotated to the left on today's exam, resulting in distortion of the mediastinal contours and reduced diagnostic sensitivity and specificity for mediastinal pathology. Atherosclerosis in the thoracic aorta. IMPRESSION: 1. The appearance of the chest suggests congestive heart failure, as above. 2. Aortic atherosclerosis. Electronically Signed   By: Trudie Reed M.D.   On: 12/22/2016 15:41       --Wells Guiles, MD.  ICU Pager: [phone] [facility] Office Number: [phone]   12/23/2016   Critical Care Attestation.  I have personally obtained a history, examined the patient, evaluated laboratory and imaging results, formulated the assessment and plan and placed orders. The Patient requires high complexity decision making for  assessment and support, frequent evaluation and titration of therapies, application of advanced monitoring technologies and extensive interpretation of multiple databases. The patient has critical illness that could lead imminently to failure of 1 or more organ systems and requires the highest level of physician preparedness to intervene.  Critical Care Time devoted to patient care services described in this note is 35 minutes and is exclusive of time spent in procedures.

## 2016-12-23 NOTE — Progress Notes (Signed)
Initial Nutrition Assessment  DOCUMENTATION CODES:   Morbid obesity  INTERVENTION:  -TF: recommend Vital High Protein at rate of 20 ml/hr with Prostat 30 mL 5 times daily providing 980 kcals, 117 g of protein and 400 mL of free water. With additional kcals from diprivan, meets 100% estimated calorie and protein needs per ASPEN guidelines  NUTRITION DIAGNOSIS:   Inadequate oral intake related to acute illness as evidenced by NPO status.  GOAL:   Provide needs based on ASPEN/SCCM guidelines  MONITOR:   Labs, Weight trends, Vent status, TF tolerance  REASON FOR ASSESSMENT:   Ventilator    ASSESSMENT:    66 yo female admitted with respiratory failure with underlying OSA/obesity hypoventilation syndrome, shock, ARF. Pt with hx of DM, HTN, psychosis  Pt on vent support Diprivan 33.8 ml/hr (892 kcals), levophed @ 28 mcg/min OG tube in place, MD Ramachandran examined abdominal xray during ICU rounds and tube ok for use for feedings Labs: reviewed Meds: reviewed  Diet Order:  Diet NPO time specified  Skin:  Wound (see comment) (stage III on heel)  Last BM:  no documented BM  Height:   Ht Readings from Last 1 Encounters:  12/22/16 5' 4.02" (1.626 m)    Weight:   Wt Readings from Last 1 Encounters:  12/23/16 281 lb 4.9 oz (127.6 kg)   Filed Weights   12/22/16 1700 12/22/16 1926 12/23/16 0500  Weight: 275 lb 9.2 oz (125 kg) 284 lb 14.4 oz (129.2 kg) 281 lb 4.9 oz (127.6 kg)    BMI:  Body mass index is 48.26 kg/m.  Estimated Nutritional Needs:   Kcal:  [id number] kcals  Protein:  110-138 g  Fluid:  >/= 1.6 L  EDUCATION NEEDS:   Education needs addressed  Romelle Starcherate Marcos Ruelas MS, RD, LDN [phone] Pager  [phone] Weekend/On-Call Pager

## 2016-12-23 NOTE — Consult Note (Signed)
WOC Nurse wound consult note Reason for Consult:Left plantar foot neuropathic ulcer.  Present on admission. Intertriginous dermatitis to abdominal pannus.   Wound type:neuropathic and moisture Pressure Injury POA: Yes Measurement:Left foot 2 cm x 1.5 cm x 0.3 cm with calloused periwound circumferentially Wound JXB:JYNWGNFAObed:calloused, ruddy red Drainage (amount, consistency, odor) none noted Periwound:calloused Dressing procedure/placement/frequency:Cleanse wound to left foot with NS and pat gently dry. Apply packing strip to wound bed.  Cover with 4x4 and kerlix.  Change daily.   Abdominal pannus with erythema, musty odor and moisture associated skin damage.  Will begin antimicrobial textile with wicking properties.  Measure and cut length of InterDry Ag+ to fit in skin folds that have skin breakdown Tuck InterDry  Ag+ fabric into skin folds in a single layer, allow for 2 inches of overhang from skin edges to allow for wicking to occur May remove to bathe; dry area thoroughly and then tuck into affected areas again Do not apply any creams or ointments when using InterDry Ag+ DO NOT THROW AWAY FOR 5 DAYS unless soiled with stool DO NOT [facility] product, this will inactivate the silver in the material  New sheet of Interdry Ag+ should be applied after 5 days of use if patient continues to have skin breakdown   Will not follow at this time.  Please re-consult if needed.  Maple HudsonKaren Aryaan Persichetti RN BSN CWON Pager [phone]

## 2016-12-24 LAB — CBC
HCT: 42.1 % (ref 35.0–47.0)
Hemoglobin: 13.4 g/dL (ref 12.0–16.0)
MCH: 27.2 pg (ref 26.0–34.0)
MCHC: 31.9 g/dL — ABNORMAL LOW (ref 32.0–36.0)
MCV: 85.3 fL (ref 80.0–100.0)
PLATELETS: 163 10*3/uL (ref 150–440)
RBC: 4.93 MIL/uL (ref 3.80–5.20)
RDW: 18.8 % — ABNORMAL HIGH (ref 11.5–14.5)
WBC: 9.7 10*3/uL (ref 3.6–11.0)

## 2016-12-24 LAB — BASIC METABOLIC PANEL
Anion gap: 5 (ref 5–15)
BUN: 28 mg/dL — AB (ref 6–20)
CO2: 26 mmol/L (ref 22–32)
Calcium: 8 mg/dL — ABNORMAL LOW (ref 8.9–10.3)
Chloride: 113 mmol/L — ABNORMAL HIGH (ref 101–111)
Creatinine, Ser: 1.21 mg/dL — ABNORMAL HIGH (ref 0.44–1.00)
GFR, EST AFRICAN AMERICAN: 53 mL/min — AB (ref >60)
GFR, EST NON AFRICAN AMERICAN: 46 mL/min — AB (ref >60)
Glucose, Bld: 109 mg/dL — ABNORMAL HIGH (ref 65–99)
POTASSIUM: 3.3 mmol/L — AB (ref 3.5–5.1)
SODIUM: 144 mmol/L (ref 135–145)

## 2016-12-24 LAB — GLUCOSE, CAPILLARY
GLUCOSE-CAPILLARY: 147 mg/dL — AB (ref 65–99)
Glucose-Capillary: 116 mg/dL — ABNORMAL HIGH (ref 65–99)
Glucose-Capillary: 112 mg/dL — ABNORMAL HIGH (ref 65–99)
Glucose-Capillary: 147 mg/dL — ABNORMAL HIGH (ref 65–99)
Glucose-Capillary: 118 mg/dL — ABNORMAL HIGH (ref 65–99)

## 2016-12-24 LAB — MAGNESIUM: MAGNESIUM: 1.8 mg/dL (ref 1.7–2.4)

## 2016-12-24 LAB — PROCALCITONIN: PROCALCITONIN: 0.21 ng/mL

## 2016-12-24 MED ORDER — MAGNESIUM SULFATE 2 GM/50ML IV SOLN
2.0000 g | Freq: Once | INTRAVENOUS | Status: AC
  Administered 2016-12-24: 2 g via INTRAVENOUS
  Filled 2016-12-24: qty 50

## 2016-12-24 MED ORDER — FAMOTIDINE 20 MG PO TABS
20.0000 mg | ORAL_TABLET | Freq: Every day | ORAL | Status: DC
  Administered 2016-12-25 – 2016-12-26 (×2): 20 mg
  Filled 2016-12-24 (×2): qty 1

## 2016-12-24 MED ORDER — SODIUM CHLORIDE 0.9 % IV SOLN
30.0000 meq | Freq: Once | INTRAVENOUS | Status: AC
  Administered 2016-12-24: 30 meq via INTRAVENOUS
  Filled 2016-12-24: qty 15

## 2016-12-24 MED ORDER — SODIUM CHLORIDE 0.9 % IV SOLN
1250.0000 mg | Freq: Two times a day (BID) | INTRAVENOUS | Status: DC
  Administered 2016-12-24: 1250 mg via INTRAVENOUS
  Filled 2016-12-24 (×3): qty 1250

## 2016-12-24 NOTE — Progress Notes (Signed)
Removed Trach sutures per Dr. Mikey BussingMcQueen's order.

## 2016-12-24 NOTE — Progress Notes (Signed)
[facility] Medicine Progess Note    ASSESSMENT/PLAN   66 yo female with Perineal cellulitis/UTI severe septic shock, CHF with pulmonary edema, AKI,  intubated.   PULMONARY A:Acute hypoxic respiratory failure, vent dependent.  Currently on PRVC 16/500/5/45% 7.41/30/106/19-- will decrease vent rate.  CT and CXR images reviewed;  Pulmonary edema and Reduced lung volumes due to obesity.  P:   Continue pressors, conservative fluids.  Wean down pressors as tolerated.  Decrease vent rate.   CARDIOVASCULAR A: Septic shock. Currently on levofloxacin and phenylephrine. Wean down pressors as tolerated. 2. Map greater than 65. P:  Continue treatment of source.   RENAL A:  AKI, creatinine, decreasing, good urine output. P:   Monitor UOP  GASTROINTESTINAL A:  Tube feeds, will continue. P:     HEMATOLOGIC A:  Leukocytosis likely secondary to UTI with sepsis.  P:  --  INFECTIOUS A:  UTI/Perineal cellulitis with severe septic shock.  P:   Wound care consulted.   Micro/culture results:  BCx2 3/18; negative.  UC 3/18; pending.  Sputum 3/19: Pending MRSA screen 12/22/16; negative.   Antibiotics: Cefepime. 3/19>> Vancomycin 3/19>>  ENDOCRINE A:  Mild hyperglycemia, continue SSI.  P:     NEUROLOGIC A:  Metabolic encephalopathy.  P:   Continue propofol for sedation, wean down fentanyl.  --Restarted home risperdal.    MAJOR EVENTS/TEST RESULTS:   Best Practices  DVT Prophylaxis: heparin GI Prophylaxis: famotidine.    ---------------------------------------   ----------------------------------------   Name: Jasmine Osborn MRN: [id number] DOB: 11-14-1950    ADMISSION DATE:  12/22/2016    SUBJECTIVE:   Pt currently on the ventilator, can not provide history or review of systems.   Review of Systems:  --   VITAL SIGNS: Temp:  [98.9 F (37.2 C)-99.8 F (37.7 C)] 99.1 F (37.3 C) (03/20 0400) Pulse Rate:  [84-100] 89 (03/20  0600) Resp:  [0-17] 0 (03/20 0600) BP: (75-137)/(30-78) 106/54 (03/20 0600) SpO2:  [91 %-97 %] 93 % (03/20 0600) FiO2 (%):  [45 %] 45 % (03/20 0421) Weight:  [275 lb 2.2 oz (124.8 kg)] 275 lb 2.2 oz (124.8 kg) (03/20 0500) HEMODYNAMICS: CVP:  [18 mmHg-20 mmHg] 20 mmHg VENTILATOR SETTINGS: Vent Mode: PRVC FiO2 (%):  [45 %] 45 % Set Rate:  [16 bmp] 16 bmp Vt Set:  [500 mL] 500 mL PEEP:  [5 cmH20] 5 cmH20 Plateau Pressure:  [22 cmH20] 22 cmH20 INTAKE / OUTPUT:  Intake/Output Summary (Last 24 hours) at 12/24/16 8295 Last data filed at 12/24/16 0600  Gross per 24 hour  Intake          2326.69 ml  Output             4625 ml  Net         -2298.31 ml    PHYSICAL EXAMINATION: Physical Examination:   VS: BP (!) 106/54   Pulse 89   Temp 99.1 F (37.3 C)   Resp (!) 0   Ht 5' 4.02" (1.626 m)   Wt 275 lb 2.2 oz (124.8 kg)   SpO2 93%   BMI 47.20 kg/m   General Appearance: No distress  Neuro:without focal findings, mental status reduced.  HEENT: PERRLA, EOM intact. Pulmonary: Scattered rhonchi bilaterally. CardiovascularNormal S1,S2.  No m/r/g.   Abdomen: Benign, Soft, non-tender. Renal:  No costovertebral tenderness  GU:  Not performed at this time. Endocrine: No evident thyromegaly. Skin:   warm, no rashes, no ecchymosis  Extremities: normal, no cyanosis, clubbing.  LABS:   LABORATORY PANEL:   CBC  Recent Labs Lab 12/24/16 0452  WBC 9.7  HGB 13.4  HCT 42.1  PLT 163    Chemistries   Recent Labs Lab 12/22/16 1510  12/23/16 0313 12/24/16 0452  NA 136  --  136 144  K 4.4  --  3.4* 3.3*  CL 104  --  105 113*  CO2 25  --  19* 26  GLUCOSE 79  --  144* 109*  BUN 49*  --  44* 28*  CREATININE 3.50*  --  2.28* 1.21*  CALCIUM 9.2  --  8.8* 8.0*  MG  --   < > 1.7 1.8  PHOS  --   < > 2.9  --   AST 17  --   --   --   ALT 18  --   --   --   ALKPHOS 37*  --   --   --   BILITOT 1.0  --   --   --   < > = values in this interval not displayed.   Recent  Labs Lab 12/23/16 1159 12/23/16 1626 12/23/16 1931 12/23/16 2327 12/24/16 0409 12/24/16 0754  GLUCAP 242* 174* 185* 143* 116* 112*    Recent Labs Lab 12/22/16 1823 12/22/16 2130 12/23/16 0500  PHART 7.19* 7.38 7.41  PCO2ART 62* 35 30*  PO2ART 78* 112* 106    Recent Labs Lab 12/22/16 1510  AST 17  ALT 18  ALKPHOS 37*  BILITOT 1.0  ALBUMIN 3.2*    Cardiac Enzymes  Recent Labs Lab 12/23/16 1215  TROPONINI 0.17*    RADIOLOGY:  Dg Abdomen 1 View  Result Date: 12/22/2016 CLINICAL DATA:  Post intubation an orogastric tube placement. EXAM: ABDOMEN - 1 VIEW COMPARISON:  CT of the abdomen and pelvis 09/16/2010 FINDINGS: Limited view of the abdomen demonstrates enteric catheter projecting over the left midabdomen. Its location however is uncertain as it does not follow the expected curvature of the stomach. Paucity of bowel gas is noted. No evidence of airspace consolidation. IMPRESSION: Enteric catheter overlies the left mid abdomen with uncertain location. Paucity of gas in the upper abdomen prevents confirmation of intragastric placement. Electronically Signed   By: Ted Mcalpine M.D.   On: 12/22/2016 18:13   Ct Head Wo Contrast  Result Date: 12/22/2016 CLINICAL DATA:  Per husband pt has been lethargic and weak on and off for the past week. Pt intubated EXAM: CT HEAD WITHOUT CONTRAST TECHNIQUE: Contiguous axial images were obtained from the base of the skull through the vertex without intravenous contrast. COMPARISON:  None. FINDINGS: Brain: No acute intracranial hemorrhage. No focal mass lesion. No CT evidence of acute infarction. No midline shift or mass effect. No hydrocephalus. Basilar cisterns are patent. Vascular: No hyperdense vessel or unexpected calcification. Skull: Normal. Negative for fracture or focal lesion. Sinuses/Orbits: Coastal thickening in the maxillary sinuses. Opacification ethmoid air cells. Other: Intubated patient IMPRESSION: 1. No acute  intracranial findings. 2. Maxillary and ethmoid sinus inflammation. Electronically Signed   By: Genevive Bi M.D.   On: 12/22/2016 20:19   US Renal  Result Date: 12/23/2016 CLINICAL DATA:  Renal failure EXAM: RENAL / URINARY TRACT ULTRASOUND COMPLETE COMPARISON:  CT abdomen 09/16/2010 FINDINGS: Right Kidney: Length: 12.1 cm. Question trace perinephric fluid. Increased echogenicity of the renal cortex. No renal obstruction or mass. Left Kidney: Length: 11.8 cm. Left kidney difficult image due to body habitus. No renal mass or obstruction. Increased cortical echogenicity. Bladder: Foley catheter.  Urinary bladder empty. Incidental note of gallstones. IMPRESSION: Increased cortical echogenicity bilaterally.  No renal obstruction Cholelithiasis Electronically Signed   By: Marlan Palau M.D.   On: 12/23/2016 11:26   Dg Chest Port 1 View  Result Date: 12/23/2016 CLINICAL DATA:  Respiratory failure. EXAM: PORTABLE CHEST 1 VIEW COMPARISON:  12/22/2016. FINDINGS: Endotracheal tube, IJ line, NG tube in stable position. Cardiomegaly with pulmonary vascular congestion. Bilateral pulmonary infiltrates and pleural effusions. Findings consistent CHF. Left base atelectasis. No pneumothorax . IMPRESSION: 1. Lines and tubes in stable position. 2. Cardiomegaly with pulmonary venous congestion bilateral pulmonary infiltrates and pleural effusions consistent with CHF. 3.  Left base atelectasis . Electronically Signed   By: Maisie Fus  Register   On: 12/23/2016 06:45   Dg Chest Port 1 View  Result Date: 12/22/2016 CLINICAL DATA:  Central line placement EXAM: PORTABLE CHEST 1 VIEW COMPARISON:  Chest radiograph 12/22/2016 at 5:26 p.m. FINDINGS: Unchanged position of endotracheal tube tip just below the level of the clavicles. Left internal jugular vein approach central venous catheter tip is in the lower SVC. Orogastric tube courses beyond the field of view. Cardiomegaly and bibasilar linear opacities are unchanged. Small left  pleural effusion persists. IMPRESSION: Left IJ central venous catheter tip in the lower SVC. Electronically Signed   By: Deatra Robinson M.D.   On: 12/22/2016 21:49   Dg Chest Portable 1 View  Result Date: 12/22/2016 CLINICAL DATA:  Status post intubation. EXAM: PORTABLE CHEST 1 VIEW COMPARISON:  12/22/2016 at 1510 hours FINDINGS: Endotracheal tube terminates approximately 2.5 cm above the carina. Enteric tube courses into the left upper abdomen with tip not imaged. The cardiac silhouette remains enlarged. Pulmonary vascular congestion, increased interstitial markings, and patchy bibasilar airspace opacities are stable to slightly improved. There may be a small left pleural effusion. No pneumothorax is identified. IMPRESSION: 1. Endotracheal tube as above. 2. Stable to mildly improved pulmonary edema. Electronically Signed   By: Sebastian Ache M.D.   On: 12/22/2016 18:08   Dg Chest Portable 1 View  Result Date: 12/22/2016 CLINICAL DATA:  66 year old female with history of hypertension and diabetes presenting with lethargy and weakness intermittently for the past week. Low oxygen saturations. EXAM: PORTABLE CHEST 1 VIEW COMPARISON:  Chest x-ray 09/16/2010. FINDINGS: There is cephalization of the pulmonary vasculature, indistinctness of the interstitial markings, and patchy airspace disease throughout the lungs bilaterally suggestive of moderate pulmonary edema. No pleural effusions. Mild cardiomegaly. The patient is rotated to the left on today's exam, resulting in distortion of the mediastinal contours and reduced diagnostic sensitivity and specificity for mediastinal pathology. Atherosclerosis in the thoracic aorta. IMPRESSION: 1. The appearance of the chest suggests congestive heart failure, as above. 2. Aortic atherosclerosis. Electronically Signed   By: Trudie Reed M.D.   On: 12/22/2016 15:41       --Wells Guiles, MD.  ICU Pager: [phone] [facility]  Number: [phone]   12/24/2016   Critical Care Attestation.  I have personally obtained a history, examined the patient, evaluated laboratory and imaging results, formulated the assessment and plan and placed orders. The Patient requires high complexity decision making for assessment and support, frequent evaluation and titration of therapies, application of advanced monitoring technologies and extensive interpretation of multiple databases. The patient has critical illness that could lead imminently to failure of 1 or more organ systems and requires the highest level of physician preparedness to intervene.  Critical Care Time devoted to patient care services described in this note is  37 minutes and is exclusive of time spent in procedures.

## 2016-12-24 NOTE — Progress Notes (Signed)
Pharmacy Antibiotic Note  Jasmine DustmanDarlene O Osborn is a 66 y.o. female admitted on 12/22/2016 with sepsis.  Pharmacy has been consulted for cefepime and vancomycin dosing.  Plan: 1. Will increase cefepime to 2 g IV q 12 hours.  2. Will continue vancomycin 1250 mg IV Q12hr for goal trough of 15-20. Will order trough prior to 1100 dose on 3/21 to monitor adjustment. Trough will be prior to 3rd dose of regimen.  Height: 5' 4.02" (162.6 cm) Weight: 275 lb 2.2 oz (124.8 kg) IBW/kg (Calculated) : 54.74  Temp (24hrs), Avg:99.5 F (37.5 C), Min:99.1 F (37.3 C), Max:99.8 F (37.7 C)   Recent Labs Lab 12/22/16 0313 12/22/16 1510 12/22/16 1546 12/22/16 1930 12/23/16 0313 12/24/16 0452  WBC  --  10.8  --  15.5*  --  9.7  CREATININE  --  3.50*  --   --  2.28* 1.21*  LATICACIDVEN 1.0  --  1.0  --   --   --     Estimated Creatinine Clearance: 59.7 mL/min (A) (by C-G formula based on SCr of 1.21 mg/dL (H)).    No Known Allergies  Thank you for allowing pharmacy to be a part of this patient's care.  Simpson,Michael L 12/24/2016 8:07 PM

## 2016-12-25 ENCOUNTER — Inpatient Hospital Stay: Payer: 59

## 2016-12-25 DIAGNOSIS — B961 Klebsiella pneumoniae [K. pneumoniae] as the cause of diseases classified elsewhere: Secondary | ICD-10-CM

## 2016-12-25 DIAGNOSIS — N390 Urinary tract infection, site not specified: Secondary | ICD-10-CM

## 2016-12-25 LAB — BASIC METABOLIC PANEL
ANION GAP: 2 — AB (ref 5–15)
BUN: 26 mg/dL — ABNORMAL HIGH (ref 6–20)
CO2: 27 mmol/L (ref 22–32)
Calcium: 8.1 mg/dL — ABNORMAL LOW (ref 8.9–10.3)
Chloride: 117 mmol/L — ABNORMAL HIGH (ref 101–111)
Creatinine, Ser: 0.98 mg/dL (ref 0.44–1.00)
GFR calc non Af Amer: 59 mL/min — ABNORMAL LOW (ref >60)
GLUCOSE: 105 mg/dL — AB (ref 65–99)
POTASSIUM: 3.7 mmol/L (ref 3.5–5.1)
Sodium: 146 mmol/L — ABNORMAL HIGH (ref 135–145)

## 2016-12-25 LAB — CULTURE, RESPIRATORY W GRAM STAIN: Culture: NO GROWTH

## 2016-12-25 LAB — GLUCOSE, CAPILLARY
GLUCOSE-CAPILLARY: 111 mg/dL — AB (ref 65–99)
GLUCOSE-CAPILLARY: 115 mg/dL — AB (ref 65–99)
GLUCOSE-CAPILLARY: 119 mg/dL — AB (ref 65–99)
GLUCOSE-CAPILLARY: 121 mg/dL — AB (ref 65–99)
Glucose-Capillary: 108 mg/dL — ABNORMAL HIGH (ref 65–99)
Glucose-Capillary: 102 mg/dL — ABNORMAL HIGH (ref 65–99)
Glucose-Capillary: 158 mg/dL — ABNORMAL HIGH (ref 65–99)

## 2016-12-25 LAB — CBC
HEMATOCRIT: 39 % (ref 35.0–47.0)
HEMOGLOBIN: 12 g/dL (ref 12.0–16.0)
MCH: 26.7 pg (ref 26.0–34.0)
MCHC: 30.8 g/dL — AB (ref 32.0–36.0)
MCV: 86.6 fL (ref 80.0–100.0)
Platelets: 151 10*3/uL (ref 150–440)
RBC: 4.5 MIL/uL (ref 3.80–5.20)
RDW: 18.8 % — AB (ref 11.5–14.5)
WBC: 6.4 10*3/uL (ref 3.6–11.0)

## 2016-12-25 LAB — MAGNESIUM: Magnesium: 1.8 mg/dL (ref 1.7–2.4)

## 2016-12-25 LAB — URINE CULTURE: SPECIAL REQUESTS: NORMAL

## 2016-12-25 LAB — CULTURE, RESPIRATORY

## 2016-12-25 LAB — TRIGLYCERIDES: TRIGLYCERIDES: 371 mg/dL — AB (ref <150)

## 2016-12-25 MED ORDER — FENTANYL 2500MCG IN NS 250ML (10MCG/ML) PREMIX INFUSION
25.0000 ug/h | INTRAVENOUS | Status: DC
  Administered 2016-12-25: 50 ug/h via INTRAVENOUS
  Filled 2016-12-25: qty 250

## 2016-12-25 MED ORDER — VITAL HIGH PROTEIN PO LIQD
1000.0000 mL | ORAL | Status: DC
  Administered 2016-12-26 (×2): 1000 mL

## 2016-12-25 MED ORDER — CEFAZOLIN IN D5W 1 GM/50ML IV SOLN
1.0000 g | Freq: Two times a day (BID) | INTRAVENOUS | Status: AC
  Administered 2016-12-25 – 2016-12-31 (×14): 1 g via INTRAVENOUS
  Filled 2016-12-25 (×16): qty 50

## 2016-12-25 MED ORDER — FENTANYL CITRATE (PF) 100 MCG/2ML IJ SOLN: 50.0000 ug | INTRAMUSCULAR | Status: DC | PRN

## 2016-12-25 MED ORDER — FUROSEMIDE 10 MG/ML IJ SOLN
20.0000 mg | Freq: Three times a day (TID) | INTRAMUSCULAR | Status: AC
  Administered 2016-12-25 – 2016-12-26 (×3): 20 mg via INTRAVENOUS
  Filled 2016-12-25 (×3): qty 2

## 2016-12-25 MED ORDER — ENOXAPARIN SODIUM 40 MG/0.4ML ~~LOC~~ SOLN
40.0000 mg | Freq: Two times a day (BID) | SUBCUTANEOUS | Status: DC
  Administered 2016-12-25 – 2017-01-03 (×19): 40 mg via SUBCUTANEOUS
  Filled 2016-12-25 (×19): qty 0.4

## 2016-12-25 MED ORDER — POTASSIUM CHLORIDE 20 MEQ PO PACK
20.0000 meq | PACK | Freq: Two times a day (BID) | ORAL | Status: DC
  Administered 2016-12-25 – 2016-12-27 (×5): 20 meq via ORAL
  Filled 2016-12-25 (×5): qty 1

## 2016-12-25 NOTE — Progress Notes (Signed)
Nutrition Follow-up  DOCUMENTATION CODES:   Morbid obesity  INTERVENTION:  -With current diprivan, recommend increasing Vital High Protein to rate of 65 ml/hr providing 1560 kcals, 137 g of protein, 1310 mL of free water. PEPuP. Additional kcals from diprivan, continue to assess   NUTRITION DIAGNOSIS:   Inadequate oral intake related to acute illness as evidenced by NPO status.  GOAL:   Provide needs based on ASPEN/SCCM guidelines  MONITOR:   Labs, Weight trends, Vent status, TF tolerance  REASON FOR ASSESSMENT:   Ventilator    ASSESSMENT:    Patient is currently intubated on ventilator support Pt on diprivan and fentanyl for sedation, levophed  Propofol: 7.5 ml/hr (198 kcals in 24 hours)  Tolerating Vital High Protein at rate of 20 ml/hr with Prostat 5 times daily  Labs: sodium 146 Meds: lasix, ss novolog, fentanyl  Diet Order:  Diet NPO time specified  Skin:  Wound (see comment) (stage III on heel)  Last BM:  12/25/16  Height:   Ht Readings from Last 1 Encounters:  12/22/16 5' 4.02" (1.626 m)    Weight:   Wt Readings from Last 1 Encounters:  12/25/16 273 lb 13 oz (124.2 kg)    BMI:  Body mass index is 46.98 kg/m.  Estimated Nutritional Needs:   Kcal:  [phone] kcals  Protein:  110-138 g  Fluid:  >/= 1.6 L  EDUCATION NEEDS:   Education needs addressed  Romelle Starcherate Malie Kashani MS, RD, LDN [phone] Pager  [phone] Weekend/On-Call Pager

## 2016-12-25 NOTE — Progress Notes (Signed)
Patient sedated on propofol- patient follows commands and resting comfortably.  Patient tolerated pressure control for about 3.5 hrs today-12/5- then she communicated that she was getting "tired-out".  Foley in place and urine output adequate.  Family was updated and will be back to visit tomorrow.

## 2016-12-25 NOTE — Progress Notes (Signed)
Patient responsive to voice, follows commands. Remains on vent on PRVC 60% Fio2. Loose watery stools X 3 will advise day nurse to hold patient laxatives and stool softeners. Barrier cream applied with each incontinent episode d/t MASD to peri-area, buttocks and vagina. Foley care done with each incontinent episode also. Dressing to left foot wound changed bilateral heel protectors in place. Patient continues on fentanyl, propofol and levophed. VSS. No signs of pain.

## 2016-12-25 NOTE — Progress Notes (Signed)
Pharmacy Antibiotic Note  [name] is a 66 y.o. female admitted on 12/22/2016 with sepsis.  Pharmacy has been consulted for cefepime and vancomycin dosing. UTI growing Klebsiella sensitive to cefazolin and BCx NGTD. `  Plan: After discussion with Dr. [name], will d/c vancomycin and cefepime and transition patient to cefazolin 1 g iv q 12 hours.   Height: 5' 4.02" (162.6 cm) Weight: 273 lb 13 oz (124.2 kg) IBW/kg (Calculated) : 54.74  Temp (24hrs), Avg:99.7 F (37.6 C), Min:99.1 F (37.3 C), Max:100.3 F (37.9 C)  Recent Results (from the past 240 hour(s))  Blood culture (routine x 2)     Status: None (Preliminary result)   Collection Time: 12/22/16  3:46 PM  Result Value Ref Range Status   Specimen Description BLOOD LEFT FOREARM  Final   Special Requests BOTTLES DRAWN AEROBIC AND ANAEROBIC BCAV  Final   Culture NO GROWTH 3 DAYS  Final   Report Status PENDING  Incomplete  Blood culture (routine x 2)     Status: None (Preliminary result)   Collection Time: 12/22/16  3:47 PM  Result Value Ref Range Status   Specimen Description BLOOD RIGHT ASSIST CONTROL  Final   Special Requests BOTTLES DRAWN AEROBIC AND ANAEROBIC BCAV  Final   Culture NO GROWTH 3 DAYS  Final   Report Status PENDING  Incomplete  Urine culture     Status: Abnormal   Collection Time: 12/22/16  4:50 PM  Result Value Ref Range Status   Specimen Description URINE, CATHETERIZED  Final   Special Requests Normal  Final   Culture >=100,000 COLONIES/mL KLEBSIELLA PNEUMONIAE (A)  Final   Report Status 12/25/2016 FINAL  Final   Organism ID, Bacteria KLEBSIELLA PNEUMONIAE (A)  Final      Susceptibility   Klebsiella pneumoniae - MIC*    AMPICILLIN RESISTANT Resistant     CEFAZOLIN <=4 SENSITIVE Sensitive     CEFTRIAXONE <=1 SENSITIVE Sensitive     CIPROFLOXACIN <=0.25 SENSITIVE Sensitive     GENTAMICIN <=1 SENSITIVE Sensitive     IMIPENEM <=0.25 SENSITIVE Sensitive     NITROFURANTOIN <=16 SENSITIVE  Sensitive     TRIMETH/SULFA <=20 SENSITIVE Sensitive     AMPICILLIN/SULBACTAM <=2 SENSITIVE Sensitive     PIP/TAZO <=4 SENSITIVE Sensitive     Extended ESBL NEGATIVE Sensitive     * >=100,000 COLONIES/mL KLEBSIELLA PNEUMONIAE  MRSA PCR Screening     Status: None   Collection Time: 12/22/16 10:22 PM  Result Value Ref Range Status   MRSA by PCR NEGATIVE NEGATIVE Final    Comment:        The GeneXpert MRSA Assay (FDA approved for NASAL specimens only), is one component of a comprehensive MRSA colonization surveillance program. It is not intended to diagnose MRSA infection nor to guide or monitor treatment for MRSA infections.   Culture, respiratory (tracheal aspirate)     Status: None   Collection Time: 12/23/16  3:32 PM  Result Value Ref Range Status   Specimen Description TRACHEAL ASPIRATE  Final   Special Requests NONE  Final   Gram Stain   Final    MODERATE WBC PRESENT, PREDOMINANTLY PMN NO ORGANISMS SEEN    Culture   Final    NO GROWTH 2 DAYS Performed at [facility], [address] [place], [place] [address]    Report Status 12/25/2016 FINAL  Final      Recent Labs Lab 12/22/16 [id number] 12/22/16 1510 12/22/16 1546 12/22/16 1930 12/23/16 [id number] 12/24/16 [id number]  12/25/16 0434  WBC  --  10.8  --  15.5*  --  9.7 6.4  CREATININE  --  3.50*  --   --  2.28* 1.21* 0.98  LATICACIDVEN 1.0  --  1.0  --   --   --   --     Estimated Creatinine Clearance: 73.5 mL/min (by C-G formula based on SCr of 0.98 mg/dL).     No Known Allergies  Thank you for allowing pharmacy to be a part of this patient's care.  [name] 12/25/2016 11:06 AM

## 2016-12-25 NOTE — Progress Notes (Signed)
[facility] Medicine Progess Note    ASSESSMENT/PLAN   66 yo female with Perineal cellulitis/UTI severe septic shock, CHF with pulmonary edema, AKI,  intubated.   PULMONARY A:Acute hypoxic respiratory failure, vent dependent.  Currently on PRVC/16/500/60% 7.41/30/106/19-- will decrease vent rate.   3/21 CXR images reviewed;  Increased Pulmonary edema and Reduced lung volumes due to obesity.  P:   Continue pressors, conservative fluids.  Will start diuresis.   CARDIOVASCULAR A: Septic shock. Currently on levophed, dose is decreasing.  P:  Wean off pressors.  Continue treatment of source.   RENAL A:  AKI, with continued improvement.   P:   Monitor UOP  GASTROINTESTINAL A:  Tube feeds, will continue. P:     HEMATOLOGIC A:  Leukocytosis likely secondary to UTI with sepsis.  P:  --  INFECTIOUS A:  Klebsiella UTI with severe septic shock.  P:   Wound care consulted.  Will narrow abx coverage for klebsiella.  DC vancomycin.   Micro/culture results: BCx2 3/18; klebsiella UC 3/18; pending.  Sputum 3/19: Pending MRSA screen 12/22/16; negative.   Antibiotics: Cefepime. 3/19>> Vancomycin 3/19>>  ENDOCRINE A:  Mild hyperglycemia, continue SSI.  P:     NEUROLOGIC A:  Metabolic encephalopathy.  P:   Continue propofol for sedation, wean down fentanyl.  --Restarted home risperdal.    MAJOR EVENTS/TEST RESULTS:   Best Practices  DVT Prophylaxis: heparin GI Prophylaxis: famotidine.    ---------------------------------------   ----------------------------------------   Name: Jasmine Osborn MRN: [id number] DOB: 12/12/1950    ADMISSION DATE:  12/22/2016    SUBJECTIVE:   Pt currently on the ventilator, can not provide history or review of systems.   Review of Systems:  --   VITAL SIGNS: Temp:  [99.1 F (37.3 C)-100.3 F (37.9 C)] 100.3 F (37.9 C) (03/21 0834) Pulse Rate:  [78-97] 97 (03/21 0900) Resp:  [0-26] 23 (03/21  0900) BP: (110-140)/(41-65) 114/48 (03/21 0900) SpO2:  [87 %-97 %] 96 % (03/21 0900) FiO2 (%):  [45 %-60 %] 60 % (03/21 0848) Weight:  [273 lb 13 oz (124.2 kg)] 273 lb 13 oz (124.2 kg) (03/21 0405) HEMODYNAMICS:   VENTILATOR SETTINGS: Vent Mode: PSV FiO2 (%):  [45 %-60 %] 60 % Set Rate:  [16 bmp] 16 bmp Vt Set:  [500 mL] 500 mL PEEP:  [5 cmH20] 5 cmH20 Pressure Support:  [12 cmH20-20 cmH20] 12 cmH20 INTAKE / OUTPUT:  Intake/Output Summary (Last 24 hours) at 12/25/16 0935 Last data filed at 12/25/16 4098  Gross per 24 hour  Intake          1688.21 ml  Output             1685 ml  Net             3.21 ml    PHYSICAL EXAMINATION: Physical Examination:   VS: BP (!) 114/48   Pulse 97   Temp 100.3 F (37.9 C) (Oral)   Resp (!) 23   Ht 5' 4.02" (1.626 m)   Wt 273 lb 13 oz (124.2 kg)   SpO2 96%   BMI 46.98 kg/m   General Appearance: No distress  Neuro:without focal findings, mental status awake, alert, follows commands.  HEENT: PERRLA, EOM intact. Pulmonary: Scattered rhonchi bilaterally. CardiovascularNormal S1,S2.  No m/r/g.   Abdomen: Benign, Soft, non-tender. Renal:  No costovertebral tenderness  GU:  Not performed at this time. Endocrine: No evident thyromegaly. Skin:   warm, no rashes, no ecchymosis  Extremities: normal,2+ bilat  edema.    LABS:   LABORATORY PANEL:   CBC  Recent Labs Lab 12/25/16 0434  WBC 6.4  HGB 12.0  HCT 39.0  PLT 151    Chemistries   Recent Labs Lab 12/22/16 1510  12/23/16 0313  12/25/16 0434  NA 136  --  136  < > 146*  K 4.4  --  3.4*  < > 3.7  CL 104  --  105  < > 117*  CO2 25  --  19*  < > 27  GLUCOSE 79  --  144*  < > 105*  BUN 49*  --  44*  < > 26*  CREATININE 3.50*  --  2.28*  < > 0.98  CALCIUM 9.2  --  8.8*  < > 8.1*  MG  --   < > 1.7  < > 1.8  PHOS  --   < > 2.9  --   --   AST 17  --   --   --   --   ALT 18  --   --   --   --   ALKPHOS 37*  --   --   --   --   BILITOT 1.0  --   --   --   --   < > = values  in this interval not displayed.   Recent Labs Lab 12/24/16 1141 12/24/16 1600 12/24/16 1943 12/25/16 0015 12/25/16 0342 12/25/16 0829  GLUCAP 147* 147* 118* 119* 111* 108*    Recent Labs Lab 12/22/16 1823 12/22/16 2130 12/23/16 0500  PHART 7.19* 7.38 7.41  PCO2ART 62* 35 30*  PO2ART 78* 112* 106    Recent Labs Lab 12/22/16 1510  AST 17  ALT 18  ALKPHOS 37*  BILITOT 1.0  ALBUMIN 3.2*    Cardiac Enzymes  Recent Labs Lab 12/23/16 1215  TROPONINI 0.17*    RADIOLOGY:  [place] Renal  Result Date: 12/23/2016 CLINICAL DATA:  Renal failure EXAM: RENAL / URINARY TRACT ULTRASOUND COMPLETE COMPARISON:  CT abdomen 09/16/2010 FINDINGS: Right Kidney: Length: 12.1 cm. Question trace perinephric fluid. Increased echogenicity of the renal cortex. No renal obstruction or mass. Left Kidney: Length: 11.8 cm. Left kidney difficult image due to body habitus. No renal mass or obstruction. Increased cortical echogenicity. Bladder: Foley catheter.  Urinary bladder empty. Incidental note of gallstones. IMPRESSION: Increased cortical echogenicity bilaterally.  No renal obstruction Cholelithiasis Electronically Signed   By: Marlan Palauharles  Clark M.D.   On: 12/23/2016 11:26   Dg Chest Port 1 View  Result Date: 12/25/2016 CLINICAL DATA:  Hypoxia EXAM: PORTABLE CHEST 1 VIEW COMPARISON:  December 23, 2016 FINDINGS: Endotracheal tube tip is 3.5 cm above the carina. Nasogastric tube tip and side port are below the diaphragm. Central catheter tip is in the left innominate vein, unchanged. No pneumothorax. There are bilateral pleural effusions with atelectatic change and patchy dense in the left base. Heart is enlarged with pulmonary venous hypertension. No adenopathy. No bone lesions. There is atherosclerotic calcification in the aorta. IMPRESSION: Tube and catheter positions as described without pneumothorax. Evidence a degree of underlying congestive heart failure. Question pneumonia superimposed left lower lobe. No  new opacity. Stable cardiac silhouette. There is aortic atherosclerosis. Electronically Signed   By: Bretta BangWilliam  Woodruff III M.D.   On: 12/25/2016 07:06       --Wells Guileseep Keyonia Gluth, MD.  ICU Pager: [phone] [facility] Office Number: [phone]   12/25/2016   Critical Care Attestation.  I have  personally obtained a history, examined the patient, evaluated laboratory and imaging results, formulated the assessment and plan and placed orders. The Patient requires high complexity decision making for assessment and support, frequent evaluation and titration of therapies, application of advanced monitoring technologies and extensive interpretation of multiple databases. The patient has critical illness that could lead imminently to failure of 1 or more organ systems and requires the highest level of physician preparedness to intervene.  Critical Care Time devoted to patient care services described in this note is 35 minutes and is exclusive of time spent in procedures.

## 2016-12-26 ENCOUNTER — Inpatient Hospital Stay: Payer: 59

## 2016-12-26 LAB — BLOOD GAS, ARTERIAL
Acid-Base Excess: 5.4 mmol/L — ABNORMAL HIGH (ref 0.0–2.0)
Bicarbonate: 31.1 mmol/L — ABNORMAL HIGH (ref 20.0–28.0)
FIO2: 0.4
MECHANICAL RATE: 16
O2 Saturation: 94.6 %
PEEP/CPAP: 5 cmH2O
Patient temperature: 37
VT: 500 mL
pCO2 arterial: 49 mmHg — ABNORMAL HIGH (ref 32.0–48.0)
pH, Arterial: 7.41 (ref 7.350–7.450)
pO2, Arterial: 73 mmHg — ABNORMAL LOW (ref 83.0–108.0)

## 2016-12-26 LAB — CBC
HCT: 33.7 % — ABNORMAL LOW (ref 35.0–47.0)
Hemoglobin: 10.2 g/dL — ABNORMAL LOW (ref 12.0–16.0)
MCH: 26.8 pg (ref 26.0–34.0)
MCHC: 30.4 g/dL — ABNORMAL LOW (ref 32.0–36.0)
MCV: 88.1 fL (ref 80.0–100.0)
Platelets: 126 10*3/uL — ABNORMAL LOW (ref 150–440)
RBC: 3.82 MIL/uL (ref 3.80–5.20)
RDW: 19.5 % — ABNORMAL HIGH (ref 11.5–14.5)
WBC: 4.1 10*3/uL (ref 3.6–11.0)

## 2016-12-26 LAB — GLUCOSE, CAPILLARY
GLUCOSE-CAPILLARY: 120 mg/dL — AB (ref 65–99)
GLUCOSE-CAPILLARY: 131 mg/dL — AB (ref 65–99)
GLUCOSE-CAPILLARY: 153 mg/dL — AB (ref 65–99)
Glucose-Capillary: 142 mg/dL — ABNORMAL HIGH (ref 65–99)
Glucose-Capillary: 99 mg/dL (ref 65–99)
Glucose-Capillary: 132 mg/dL — ABNORMAL HIGH (ref 65–99)

## 2016-12-26 LAB — BASIC METABOLIC PANEL
Anion gap: 4 — ABNORMAL LOW (ref 5–15)
BUN: 25 mg/dL — AB (ref 6–20)
CALCIUM: 8.8 mg/dL — AB (ref 8.9–10.3)
CO2: 31 mmol/L (ref 22–32)
CREATININE: 0.88 mg/dL (ref 0.44–1.00)
Chloride: 113 mmol/L — ABNORMAL HIGH (ref 101–111)
GFR calc Af Amer: >60 mL/min (ref >60)
Glucose, Bld: 146 mg/dL — ABNORMAL HIGH (ref 65–99)
POTASSIUM: 3.9 mmol/L (ref 3.5–5.1)
SODIUM: 148 mmol/L — AB (ref 135–145)

## 2016-12-26 MED ORDER — FUROSEMIDE 10 MG/ML IJ SOLN
20.0000 mg | Freq: Three times a day (TID) | INTRAMUSCULAR | Status: AC
  Administered 2016-12-26 – 2016-12-27 (×3): 20 mg via INTRAVENOUS
  Filled 2016-12-26 (×3): qty 2

## 2016-12-26 MED ORDER — SODIUM CHLORIDE 0.9% FLUSH
10.0000 mL | INTRAVENOUS | Status: DC | PRN
  Administered 2017-01-01: 10 mL
  Filled 2016-12-26: qty 40

## 2016-12-26 MED ORDER — SODIUM CHLORIDE 0.9% FLUSH
10.0000 mL | Freq: Two times a day (BID) | INTRAVENOUS | Status: DC
  Administered 2016-12-26: 20 mL
  Administered 2016-12-27: 10 mL
  Administered 2016-12-27: 40 mL
  Administered 2016-12-28: 10 mL
  Administered 2016-12-28: 20 mL
  Administered 2016-12-29: 30 mL
  Administered 2016-12-29 – 2016-12-30 (×3): 10 mL
  Administered 2016-12-31: 30 mL
  Administered 2016-12-31: 10 mL
  Administered 2017-01-01: 30 mL
  Administered 2017-01-01: 10 mL
  Administered 2017-01-02: 30 mL
  Administered 2017-01-02: 10 mL
  Administered 2017-01-03: 30 mL

## 2016-12-26 NOTE — Progress Notes (Signed)
Pt is in stable condition at this time. Pt has had no complaints of pain at this time. Full assessment in EPIC. Report given to Maralyn SagoSarah, RN.

## 2016-12-26 NOTE — Progress Notes (Signed)
Pt had brief episode of SVT (11 beats). Dr.Ram aware. Verbal orders to terminate weaning trial. Pt sedation restarted, Pt Vent settings 40% FiO2, 5 PEEP, RR 16, tidal volume 500.  Will continue to assess.

## 2016-12-26 NOTE — Plan of Care (Signed)
Problem: Education: Goal: Knowledge of [facility] Education information/materials will improve Outcome: Progressing Pt is sedated on the ventilator and unable to receive teaching at this point. Family has been educated and had questions answered.   Problem: Pain Managment: Goal: General experience of comfort will improve Outcome: Progressing Pt has denied pain on my shift.

## 2016-12-26 NOTE — Progress Notes (Signed)
Pharmacy Antibiotic Note  Jasmine Osborn is a 66 y.o. female admitted on 12/22/2016 with sepsis.  Pharmacy has been consulted for cefazolin. UTI growing Klebsiella sensitive to cefazolin and BCx NGTD. `  Plan: Will continue cefazolin 1 g iv q 12 hours.   Height: 5' 4.02" (162.6 cm) Weight: 268 lb 8.3 oz (121.8 kg) IBW/kg (Calculated) : 54.74  Temp (24hrs), Avg:99.8 F (37.7 C), Min:99 F (37.2 C), Max:100.8 F (38.2 C)  Recent Results (from the past 240 hour(s))  Blood culture (routine x 2)     Status: None (Preliminary result)   Collection Time: 12/22/16  3:46 PM  Result Value Ref Range Status   Specimen Description BLOOD LEFT FOREARM  Final   Special Requests BOTTLES DRAWN AEROBIC AND ANAEROBIC BCAV  Final   Culture NO GROWTH 4 DAYS  Final   Report Status PENDING  Incomplete  Blood culture (routine x 2)     Status: None (Preliminary result)   Collection Time: 12/22/16  3:47 PM  Result Value Ref Range Status   Specimen Description BLOOD RIGHT ASSIST CONTROL  Final   Special Requests BOTTLES DRAWN AEROBIC AND ANAEROBIC BCAV  Final   Culture NO GROWTH 4 DAYS  Final   Report Status PENDING  Incomplete  Urine culture     Status: Abnormal   Collection Time: 12/22/16  4:50 PM  Result Value Ref Range Status   Specimen Description URINE, CATHETERIZED  Final   Special Requests Normal  Final   Culture >=100,000 COLONIES/mL KLEBSIELLA PNEUMONIAE (A)  Final   Report Status 12/25/2016 FINAL  Final   Organism ID, Bacteria KLEBSIELLA PNEUMONIAE (A)  Final      Susceptibility   Klebsiella pneumoniae - MIC*    AMPICILLIN RESISTANT Resistant     CEFAZOLIN <=4 SENSITIVE Sensitive     CEFTRIAXONE <=1 SENSITIVE Sensitive     CIPROFLOXACIN <=0.25 SENSITIVE Sensitive     GENTAMICIN <=1 SENSITIVE Sensitive     IMIPENEM <=0.25 SENSITIVE Sensitive     NITROFURANTOIN <=16 SENSITIVE Sensitive     TRIMETH/SULFA <=20 SENSITIVE Sensitive     AMPICILLIN/SULBACTAM <=2 SENSITIVE Sensitive    PIP/TAZO <=4 SENSITIVE Sensitive     Extended ESBL NEGATIVE Sensitive     * >=100,000 COLONIES/mL KLEBSIELLA PNEUMONIAE  MRSA PCR Screening     Status: None   Collection Time: 12/22/16 10:22 PM  Result Value Ref Range Status   MRSA by PCR NEGATIVE NEGATIVE Final    Comment:        The GeneXpert MRSA Assay (FDA approved for NASAL specimens only), is one component of a comprehensive MRSA colonization surveillance program. It is not intended to diagnose MRSA infection nor to guide or monitor treatment for MRSA infections.   Culture, respiratory (tracheal aspirate)     Status: None   Collection Time: 12/23/16  3:32 PM  Result Value Ref Range Status   Specimen Description TRACHEAL ASPIRATE  Final   Special Requests NONE  Final   Gram Stain   Final    MODERATE WBC PRESENT, PREDOMINANTLY PMN NO ORGANISMS SEEN    Culture   Final    NO GROWTH 2 DAYS Performed at [facility], [address], [place], [place] [address]    Report Status 12/25/2016 FINAL  Final      Recent Labs Lab 12/22/16 0313 12/22/16 1510 12/22/16 1546 12/22/16 1930 12/23/16 0313 12/24/16 0452 12/25/16 0434 12/26/16 0510  WBC  --  10.8  --  15.5*  --  9.7 6.4 4.1  CREATININE  --  3.50*  --   --  2.28* 1.21* 0.98 0.88  LATICACIDVEN 1.0  --  1.0  --   --   --   --   --     Estimated Creatinine Clearance: 80.9 mL/min (by C-G formula based on SCr of 0.88 mg/dL).     No Known Allergies  Thank you for allowing pharmacy to be a part of this patient's care.  Luisa HartChristy, Nosson Wender D 12/26/2016 1:33 PM

## 2016-12-26 NOTE — Progress Notes (Signed)
[facility] Critical Care Medicine Progess Note    ASSESSMENT/PLAN   66 yo female with Perineal cellulitis/UTI severe septic shock, CHF with pulmonary edema, AKI,  intubated. Febrile this am.   PULMONARY A:Acute hypoxic respiratory failure, vent dependent.  Currently on PRVC/16/500/40% 7.41/49/73/31.1  3/22 CXR images reviewed;  Continued Pulmonary edema and Reduced lung volumes due to obesity with bibasilar atelectasis.  P:   --Patient placed on 5/5 trial today, tolerated time 1 hour, but developed SVT and appears somewhat uncomfortable with borderline volumes. Placed  Back on vent due to weaning failure.   CARDIOVASCULAR A: Septic shock. Currently on levophed, dose is decreasing.  P:  Wean off pressors.  Continue treatment of source.   RENAL A:  AKI, --improvement.  Good urine output.  P:   Monitor UOP, continue diuresis.   GASTROINTESTINAL A:  Tube feeds, will continue. P:     HEMATOLOGIC A:  Leukocytosis likely secondary to UTI with sepsis.  P:  --  INFECTIOUS A:  Klebsiella UTI with severe septic shock.  P:   Wound care consulted.  Will narrow abx coverage for klebsiella.  DC vancomycin.   Micro/culture results: BCx2 3/18; klebsiella UC 3/18; pending.  Sputum 3/19: Pending MRSA screen 12/22/16; negative.   Antibiotics: Cefepime. 3/19>>3/21 Vancomycin 3/19>>3/21 Cafazoline 3/21>>  ENDOCRINE A:  Mild hyperglycemia, continue SSI.  P:     NEUROLOGIC A:  Metabolic encephalopathy.  P:   Continue propofol for sedation, wean down fentanyl.  --Continue home risperdal.    MAJOR EVENTS/TEST RESULTS:   Best Practices  DVT Prophylaxis: heparin GI Prophylaxis: famotidine.    ---------------------------------------   ----------------------------------------   Name: Jasmine Osborn MRN: [id number] DOB: 10/05/1951    ADMISSION DATE:  12/22/2016    SUBJECTIVE:   Pt currently on the ventilator, can not provide history or review of  systems.   Review of Systems:  --   VITAL SIGNS: Temp:  [99 F (37.2 C)-100.8 F (38.2 C)] 100.8 F (38.2 C) (03/22 0800) Pulse Rate:  [83-110] 110 (03/22 0800) Resp:  [15-38] 38 (03/22 0800) BP: (87-146)/(38-99) 124/56 (03/22 0800) SpO2:  [88 %-96 %] 93 % (03/22 0745) FiO2 (%):  [40 %-60 %] 40 % (03/22 0800) Weight:  [268 lb 8.3 oz (121.8 kg)] 268 lb 8.3 oz (121.8 kg) (03/22 0449) HEMODYNAMICS:   VENTILATOR SETTINGS: Vent Mode: PSV FiO2 (%):  [40 %-60 %] 40 % Set Rate:  [16 bmp] 16 bmp Vt Set:  [500 mL] 500 mL PEEP:  [5 cmH20] 5 cmH20 Pressure Support:  [5 cmH20-12 cmH20] 5 cmH20 INTAKE / OUTPUT:  Intake/Output Summary (Last 24 hours) at 12/26/16 0906 Last data filed at 12/26/16 0800  Gross per 24 hour  Intake          1318.41 ml  Output             4525 ml  Net         -3206.59 ml    PHYSICAL EXAMINATION: Physical Examination:   VS: BP (!) 124/56 (BP Location: Left Arm)   Pulse (!) 110   Temp (!) 100.8 F (38.2 C) (Axillary)   Resp (!) 38   Ht 5' 4.02" (1.626 m)   Wt 268 lb 8.3 oz (121.8 kg)   SpO2 93%   BMI 46.07 kg/m   General Appearance: No distress  Neuro:without focal findings, mental status awake, alert, follows commands.  HEENT: PERRLA, EOM intact. Pulmonary: Scattered rhonchi bilaterally, similar to yesterday.  CardiovascularNormal S1,S2.  No m/r/g.  Abdomen: Benign, Soft, non-tender. Renal:  No costovertebral tenderness  GU:  Not performed at this time. Endocrine: No evident thyromegaly. Skin:   warm, no rashes, no ecchymosis  Extremities: normal,2+ bilat edema.    LABS:   LABORATORY PANEL:   CBC  Recent Labs Lab 12/26/16 0510  WBC 4.1  HGB 10.2*  HCT 33.7*  PLT 126*    Chemistries   Recent Labs Lab 12/22/16 1510  12/23/16 0313  12/25/16 0434 12/26/16 0510  NA 136  --  136  < > 146* 148*  K 4.4  --  3.4*  < > 3.7 3.9  CL 104  --  105  < > 117* 113*  CO2 25  --  19*  < > 27 31  GLUCOSE 79  --  144*  < > 105* 146*    BUN 49*  --  44*  < > 26* 25*  CREATININE 3.50*  --  2.28*  < > 0.98 0.88  CALCIUM 9.2  --  8.8*  < > 8.1* 8.8*  MG  --   < > 1.7  < > 1.8  --   PHOS  --   < > 2.9  --   --   --   AST 17  --   --   --   --   --   ALT 18  --   --   --   --   --   ALKPHOS 37*  --   --   --   --   --   BILITOT 1.0  --   --   --   --   --   < > = values in this interval not displayed.   Recent Labs Lab 12/25/16 0829 12/25/16 1214 12/25/16 1556 12/25/16 1934 12/25/16 2331 12/26/16 0343  GLUCAP 108* 115* 121* 102* 158* 142*    Recent Labs Lab 12/22/16 2130 12/23/16 0500 12/26/16 0510  PHART 7.38 7.41 7.41  PCO2ART 35 30* 49*  PO2ART 112* 106 73*    Recent Labs Lab 12/22/16 1510  AST 17  ALT 18  ALKPHOS 37*  BILITOT 1.0  ALBUMIN 3.2*    Cardiac Enzymes  Recent Labs Lab 12/23/16 1215  TROPONINI 0.17*    RADIOLOGY:  Dg Chest 1 View  Result Date: 12/26/2016 CLINICAL DATA:  66 year old female with shortness of breath EXAM: CHEST 1 VIEW COMPARISON:  Chest radiograph dated 12/25/2016 FINDINGS: Endotracheal tube, enteric tube, and left IJ central line appear in similar positioning. There is cardiomegaly with findings of congestive heart failure which appears worsened compared to the prior radiograph. Superimposed pneumonia is not excluded. There is no pneumothorax. No acute osseous pathology. IMPRESSION: 1. Cardiomegaly with interval worsening of the CHF since the prior radiograph. Superimposed pneumonia is not excluded. Clinical correlation is recommended. 2. Support devices in stable positioning. Electronically Signed   By: Elgie Collard M.D.   On: 12/26/2016 05:21   Dg Chest Port 1 View  Result Date: 12/25/2016 CLINICAL DATA:  Hypoxia EXAM: PORTABLE CHEST 1 VIEW COMPARISON:  December 23, 2016 FINDINGS: Endotracheal tube tip is 3.5 cm above the carina. Nasogastric tube tip and side port are below the diaphragm. Central catheter tip is in the left innominate vein, unchanged. No  pneumothorax. There are bilateral pleural effusions with atelectatic change and patchy dense in the left base. Heart is enlarged with pulmonary venous hypertension. No adenopathy. No bone lesions. There is atherosclerotic calcification in the aorta. IMPRESSION: Tube and catheter positions  as described without pneumothorax. Evidence a degree of underlying congestive heart failure. Question pneumonia superimposed left lower lobe. No new opacity. Stable cardiac silhouette. There is aortic atherosclerosis. Electronically Signed   By: Bretta Bang III M.D.   On: 12/25/2016 07:06       --Wells Guiles, MD.  ICU Pager: [phone] [facility] Office Number: [phone]   12/26/2016   Critical Care Attestation.  I have personally obtained a history, examined the patient, evaluated laboratory and imaging results, formulated the assessment and plan and placed orders. The Patient requires high complexity decision making for assessment and support, frequent evaluation and titration of therapies, application of advanced monitoring technologies and extensive interpretation of multiple databases. The patient has critical illness that could lead imminently to failure of 1 or more organ systems and requires the highest level of physician preparedness to intervene.  Critical Care Time devoted to patient care services described in this note is 40 minutes and is exclusive of time spent in procedures.

## 2016-12-26 NOTE — Progress Notes (Signed)
At 19:10 patient had 9 beat run of Vtach. Patient resting comfortably with no distress at that time. Potassium 3.9 on morning labs, no magensium today (was 1.8 yesterday), no cardiac history, but did have 15 beat run of SVT during SBT today per day shift RN. Notified E Link MD, who acknowledged and camera'd in to check on patient. No new orders at that time. When Rocky LinkBincy Varugeuse NP rounded at 19:30, RN updated on Vtach run and patient condition. NP stated that would add magnesium and phosphorous to morning labs for 12/27/16. Team will continue to monitor.

## 2016-12-27 ENCOUNTER — Inpatient Hospital Stay: Payer: 59

## 2016-12-27 LAB — BLOOD GAS, ARTERIAL
Acid-Base Excess: 10.5 mmol/L — ABNORMAL HIGH (ref 0.0–2.0)
Bicarbonate: 37.4 mmol/L — ABNORMAL HIGH (ref 20.0–28.0)
FIO2: 0.4
O2 SAT: 97.5 %
PATIENT TEMPERATURE: 37
PCO2 ART: 59 mmHg — AB (ref 32.0–48.0)
PEEP: 5 cmH2O
PH ART: 7.41 (ref 7.350–7.450)
PO2 ART: 96 mmHg (ref 83.0–108.0)
PRESSURE SUPPORT: 5 cmH2O

## 2016-12-27 LAB — BASIC METABOLIC PANEL
Anion gap: 6 (ref 5–15)
BUN: 23 mg/dL — AB (ref 6–20)
CHLORIDE: 107 mmol/L (ref 101–111)
CO2: 34 mmol/L — AB (ref 22–32)
CREATININE: 0.83 mg/dL (ref 0.44–1.00)
Calcium: 8.7 mg/dL — ABNORMAL LOW (ref 8.9–10.3)
GFR calc non Af Amer: >60 mL/min (ref >60)
Glucose, Bld: 122 mg/dL — ABNORMAL HIGH (ref 65–99)
POTASSIUM: 3.8 mmol/L (ref 3.5–5.1)
Sodium: 147 mmol/L — ABNORMAL HIGH (ref 135–145)

## 2016-12-27 LAB — CULTURE, BLOOD (ROUTINE X 2)
CULTURE: NO GROWTH
Culture: NO GROWTH

## 2016-12-27 LAB — GLUCOSE, CAPILLARY
GLUCOSE-CAPILLARY: 128 mg/dL — AB (ref 65–99)
GLUCOSE-CAPILLARY: 115 mg/dL — AB (ref 65–99)
Glucose-Capillary: 106 mg/dL — ABNORMAL HIGH (ref 65–99)
Glucose-Capillary: 107 mg/dL — ABNORMAL HIGH (ref 65–99)
Glucose-Capillary: 85 mg/dL (ref 65–99)
Glucose-Capillary: 93 mg/dL (ref 65–99)

## 2016-12-27 LAB — CBC
HEMATOCRIT: 38.4 % (ref 35.0–47.0)
HEMOGLOBIN: 12.1 g/dL (ref 12.0–16.0)
MCH: 27 pg (ref 26.0–34.0)
MCHC: 31.5 g/dL — AB (ref 32.0–36.0)
MCV: 85.6 fL (ref 80.0–100.0)
Platelets: 149 10*3/uL — ABNORMAL LOW (ref 150–440)
RBC: 4.48 MIL/uL (ref 3.80–5.20)
RDW: 18.9 % — ABNORMAL HIGH (ref 11.5–14.5)
WBC: 4 10*3/uL (ref 3.6–11.0)

## 2016-12-27 LAB — MAGNESIUM: MAGNESIUM: 1.3 mg/dL — AB (ref 1.7–2.4)

## 2016-12-27 LAB — PHOSPHORUS: Phosphorus: 1.6 mg/dL — ABNORMAL LOW (ref 2.5–4.6)

## 2016-12-27 MED ORDER — POTASSIUM CHLORIDE 20 MEQ PO PACK
20.0000 meq | PACK | Freq: Two times a day (BID) | ORAL | Status: AC
  Administered 2016-12-27 – 2016-12-28 (×2): 20 meq via ORAL
  Filled 2016-12-27 (×2): qty 1

## 2016-12-27 MED ORDER — ACETAZOLAMIDE SODIUM 500 MG IJ SOLR
250.0000 mg | Freq: Once | INTRAMUSCULAR | Status: AC
  Administered 2016-12-27: 250 mg via INTRAVENOUS
  Filled 2016-12-27: qty 500

## 2016-12-27 MED ORDER — BUDESONIDE 0.5 MG/2ML IN SUSP
0.5000 mg | Freq: Two times a day (BID) | RESPIRATORY_TRACT | Status: DC
  Administered 2016-12-27 – 2017-01-02 (×14): 0.5 mg via RESPIRATORY_TRACT
  Filled 2016-12-27 (×14): qty 2

## 2016-12-27 MED ORDER — SENNOSIDES-DOCUSATE SODIUM 8.6-50 MG PO TABS
1.0000 | ORAL_TABLET | Freq: Two times a day (BID) | ORAL | Status: DC
  Administered 2016-12-27 – 2017-01-02 (×12): 1 via ORAL
  Filled 2016-12-27 (×13): qty 1

## 2016-12-27 MED ORDER — IPRATROPIUM-ALBUTEROL 0.5-2.5 (3) MG/3ML IN SOLN
3.0000 mL | RESPIRATORY_TRACT | Status: DC
  Administered 2016-12-27 – 2017-01-01 (×29): 3 mL via RESPIRATORY_TRACT
  Filled 2016-12-27 (×30): qty 3

## 2016-12-27 MED ORDER — TORSEMIDE 20 MG PO TABS
20.0000 mg | ORAL_TABLET | Freq: Two times a day (BID) | ORAL | Status: DC
  Administered 2016-12-27 – 2016-12-29 (×5): 20 mg via ORAL
  Filled 2016-12-27 (×6): qty 1

## 2016-12-27 MED ORDER — DOCUSATE SODIUM 100 MG PO CAPS
100.0000 mg | ORAL_CAPSULE | Freq: Two times a day (BID) | ORAL | Status: DC
Start: 2016-12-27 — End: 2017-01-03
  Administered 2016-12-28 – 2017-01-02 (×11): 100 mg via ORAL
  Filled 2016-12-27 (×12): qty 1

## 2016-12-27 MED ORDER — FAMOTIDINE 20 MG PO TABS
20.0000 mg | ORAL_TABLET | Freq: Every day | ORAL | Status: DC
  Administered 2016-12-27 – 2017-01-03 (×8): 20 mg via ORAL
  Filled 2016-12-27 (×8): qty 1

## 2016-12-27 NOTE — Progress Notes (Signed)
Notified MD Ram that patient oxygen level had dropped into low 80's on 5L [place] and lung sounds were ronchi and wheezing. I placed patient on non-rebreather and oxygen saturations were in low 90's. MD to assess patient. Will continue to monitor.

## 2016-12-27 NOTE — Progress Notes (Signed)
Pharmacy Antibiotic Note  Jasmine Osborn is a 66 y.o. female admitted on 12/22/2016 with sepsis.  Pharmacy has been consulted for cefazolin. UTI growing Klebsiella sensitive to cefazolin and BCx NG. `  Plan: Will continue cefazolin 1 g iv q 12 hour for a total of 10 days after discussion with Dr. Nicholos Johns.   Height: 5' 4.02" (162.6 cm) Weight: 259 lb 7.7 oz (117.7 kg) IBW/kg (Calculated) : 54.74  Temp (24hrs), Avg:99.5 F (37.5 C), Min:98.9 F (37.2 C), Max:100 F (37.8 C)  Recent Results (from the past 240 hour(s))  Blood culture (routine x 2)     Status: None   Collection Time: 12/22/16  3:46 PM  Result Value Ref Range Status   Specimen Description BLOOD LEFT FOREARM  Final   Special Requests BOTTLES DRAWN AEROBIC AND ANAEROBIC BCAV  Final   Culture NO GROWTH 5 DAYS  Final   Report Status 12/27/2016 FINAL  Final  Blood culture (routine x 2)     Status: None   Collection Time: 12/22/16  3:47 PM  Result Value Ref Range Status   Specimen Description BLOOD RIGHT ASSIST CONTROL  Final   Special Requests BOTTLES DRAWN AEROBIC AND ANAEROBIC BCAV  Final   Culture NO GROWTH 5 DAYS  Final   Report Status 12/27/2016 FINAL  Final  Urine culture     Status: Abnormal   Collection Time: 12/22/16  4:50 PM  Result Value Ref Range Status   Specimen Description URINE, CATHETERIZED  Final   Special Requests Normal  Final   Culture >=100,000 COLONIES/mL KLEBSIELLA PNEUMONIAE (A)  Final   Report Status 12/25/2016 FINAL  Final   Organism ID, Bacteria KLEBSIELLA PNEUMONIAE (A)  Final      Susceptibility   Klebsiella pneumoniae - MIC*    AMPICILLIN RESISTANT Resistant     CEFAZOLIN <=4 SENSITIVE Sensitive     CEFTRIAXONE <=1 SENSITIVE Sensitive     CIPROFLOXACIN <=0.25 SENSITIVE Sensitive     GENTAMICIN <=1 SENSITIVE Sensitive     IMIPENEM <=0.25 SENSITIVE Sensitive     NITROFURANTOIN <=16 SENSITIVE Sensitive     TRIMETH/SULFA <=20 SENSITIVE Sensitive     AMPICILLIN/SULBACTAM <=2  SENSITIVE Sensitive     PIP/TAZO <=4 SENSITIVE Sensitive     Extended ESBL NEGATIVE Sensitive     * >=100,000 COLONIES/mL KLEBSIELLA PNEUMONIAE  MRSA PCR Screening     Status: None   Collection Time: 12/22/16 10:22 PM  Result Value Ref Range Status   MRSA by PCR NEGATIVE NEGATIVE Final    Comment:        The GeneXpert MRSA Assay (FDA approved for NASAL specimens only), is one component of a comprehensive MRSA colonization surveillance program. It is not intended to diagnose MRSA infection nor to guide or monitor treatment for MRSA infections.   Culture, respiratory (tracheal aspirate)     Status: None   Collection Time: 12/23/16  3:32 PM  Result Value Ref Range Status   Specimen Description TRACHEAL ASPIRATE  Final   Special Requests NONE  Final   Gram Stain   Final    MODERATE WBC PRESENT, PREDOMINANTLY PMN NO ORGANISMS SEEN    Culture   Final    NO GROWTH 2 DAYS Performed at [facility], [address] [place], [place] [address]    Report Status 12/25/2016 FINAL  Final      Recent Labs Lab 12/22/16 8119  12/22/16 1546 12/22/16 1930 12/23/16 1478 12/24/16 2956 12/25/16 0434 12/26/16 0510 12/27/16 0425  WBC  --   < >  --  15.5*  --  9.7 6.4 4.1 4.0  CREATININE  --   < >  --   --  2.28* 1.21* 0.98 0.88 0.83  LATICACIDVEN 1.0  --  1.0  --   --   --   --   --   --   < > = values in this interval not displayed.  Estimated Creatinine Clearance: 84.1 mL/min (by C-G formula based on SCr of 0.83 mg/dL).     No Known Allergies  Thank you for allowing pharmacy to be a part of this patient's care.  Luisa HartChristy, Afsa Meany D 12/27/2016 3:07 PM

## 2016-12-27 NOTE — Progress Notes (Signed)
Patient extubated to 4 L nasal cannula by RT, RN, and NP. Patient alert, voice hoarse, strong productive cough.

## 2016-12-27 NOTE — Care Management (Signed)
Patient currently meets criteria for LTAC; MD would like to wait on LTAC.

## 2016-12-27 NOTE — Progress Notes (Signed)
[facility] Critical Care Medicine Progess Note    ASSESSMENT/PLAN   66 yo female with Perineal cellulitis/UTI severe septic shock, CHF with pulmonary edema, AKI,  intubated. Febrile again this am.   PULMONARY A:Acute hypoxic respiratory failure, performed weaning trial this am, and did well, therefore pt was extubated.  7.41/59/96/37.4  3/23 CXR images reviewed;  Continued Pulmonary edema and Reduced lung volumes due to obesity with bibasilar atelectasis minimally changed from yesterday.  P:   --Monitor respiratory status.  --Wean down oxyten.   CARDIOVASCULAR A: Septic shock improved, off pressors.  P:  Continue treatment of source.   RENAL A:  AKI, --improvement.  Good urine output.  P:   Monitor UOP, continue diuresis.   GASTROINTESTINAL A:  Advance diet as tolerated.  P:     HEMATOLOGIC A:  Leukocytosis likely secondary to UTI with sepsis.  P:  --  INFECTIOUS A:  Klebsiella UTI with severe septic shock.  P:   Continue abx for klebsiella.    Micro/culture results: BCx2 3/18; klebsiella UC 3/18; pending.  Sputum 3/19: Pending MRSA screen 12/22/16; negative.   Antibiotics: Cefepime. 3/19>>3/21 Vancomycin 3/19>>3/21 Cafazoline 3/21>>  ENDOCRINE A:  Mild hyperglycemia, continue SSI.  P:     NEUROLOGIC A:  Metabolic encephalopathy improving.  P:   --Continue home risperdal.    MAJOR EVENTS/TEST RESULTS:   Best Practices  DVT Prophylaxis: heparin GI Prophylaxis: famotidine.    ---------------------------------------   ----------------------------------------   Name: Jasmine Osborn MRN: [id number] DOB: 1950/12/05    ADMISSION DATE:  12/22/2016    SUBJECTIVE:   Pt currently on the ventilator, can not provide history or review of systems.   Review of Systems:  --   VITAL SIGNS: Temp:  [99.4 F (37.4 C)-100.7 F (38.2 C)] 100 F (37.8 C) (03/23 0400) Pulse Rate:  [76-113] 109 (03/23 0800) Resp:  [14-28] 24 (03/23  0800) BP: (56-142)/(32-91) 102/80 (03/23 0800) SpO2:  [90 %-96 %] 93 % (03/23 0800) FiO2 (%):  [40 %] 40 % (03/23 0550) Weight:  [259 lb 7.7 oz (117.7 kg)] 259 lb 7.7 oz (117.7 kg) (03/23 0400) HEMODYNAMICS:   VENTILATOR SETTINGS: Vent Mode: PSV;CPAP FiO2 (%):  [40 %] 40 % Set Rate:  [16 bmp] 16 bmp Vt Set:  [500 mL] 500 mL PEEP:  [5 cmH20] 5 cmH20 Delta P (Amplitude):  [10] 10 Pressure Support:  [5 cmH20] 5 cmH20 INTAKE / OUTPUT:  Intake/Output Summary (Last 24 hours) at 12/27/16 0945 Last data filed at 12/27/16 0500  Gross per 24 hour  Intake          1638.83 ml  Output             2970 ml  Net         -1331.17 ml    PHYSICAL EXAMINATION: Physical Examination:   VS: BP 102/80   Pulse (!) 109   Temp 100 F (37.8 C) (Oral)   Resp (!) 24   Ht 5' 4.02" (1.626 m)   Wt 259 lb 7.7 oz (117.7 kg)   SpO2 93%   BMI 44.52 kg/m   General Appearance: No distress  Neuro:without focal findings, mental status awake, alert, follows commands.  HEENT: PERRLA, EOM intact. Pulmonary: Scattered rhonchi again bilaterally, similar to yesterday.  CardiovascularNormal S1,S2.  No m/r/g.   Abdomen: Benign, Soft, non-tender. Renal:  No costovertebral tenderness  GU:  Not performed at this time. Endocrine: No evident thyromegaly. Skin:   warm, no rashes, no ecchymosis  Extremities: normal,2+  bilat edema.    LABS:   LABORATORY PANEL:   CBC  Recent Labs Lab 12/27/16 0425  WBC 4.0  HGB 12.1  HCT 38.4  PLT 149*    Chemistries   Recent Labs Lab 12/22/16 1510  12/27/16 0424 12/27/16 0425  NA 136  < >  --  147*  K 4.4  < >  --  3.8  CL 104  < >  --  107  CO2 25  < >  --  34*  GLUCOSE 79  < >  --  122*  BUN 49*  < >  --  23*  CREATININE 3.50*  < >  --  0.83  CALCIUM 9.2  < >  --  8.7*  MG  --   < > 1.3*  --   PHOS  --   < > 1.6*  --   AST 17  --   --   --   ALT 18  --   --   --   ALKPHOS 37*  --   --   --   BILITOT 1.0  --   --   --   < > = values in this interval  not displayed.   Recent Labs Lab 12/26/16 1143 12/26/16 1548 12/26/16 1949 12/26/16 2338 12/27/16 0427 12/27/16 0804  GLUCAP 132* 131* 153* 120* 106* 93    Recent Labs Lab 12/23/16 0500 12/26/16 0510 12/27/16 0527  PHART 7.41 7.41 7.41  PCO2ART 30* 49* 59*  PO2ART 106 73* 96    Recent Labs Lab 12/22/16 1510  AST 17  ALT 18  ALKPHOS 37*  BILITOT 1.0  ALBUMIN 3.2*    Cardiac Enzymes  Recent Labs Lab 12/23/16 1215  TROPONINI 0.17*    RADIOLOGY:  Dg Chest 1 View  Result Date: 12/27/2016 CLINICAL DATA:  Shortness of breath. EXAM: CHEST 1 VIEW COMPARISON:  12/26/2016. FINDINGS: Endotracheal tube, left IJ line, NG tube in stable position. Cardiomegaly. Diffuse bilateral pulmonary infiltrates and small left pleural effusion again noted. No pneumothorax. IMPRESSION: 1. Lines and tubes in stable position. 2. Cardiomegaly with persistent bilateral pulmonary infiltrates consistent pulmonary edema. Small left pleural effusion again noted. Findings consistent with CHF. No interim change. Electronically Signed   By: Maisie Fushomas  Register   On: 12/27/2016 06:40   Dg Chest 1 View  Result Date: 12/26/2016 CLINICAL DATA:  66 year old female with shortness of breath EXAM: CHEST 1 VIEW COMPARISON:  Chest radiograph dated 12/25/2016 FINDINGS: Endotracheal tube, enteric tube, and left IJ central line appear in similar positioning. There is cardiomegaly with findings of congestive heart failure which appears worsened compared to the prior radiograph. Superimposed pneumonia is not excluded. There is no pneumothorax. No acute osseous pathology. IMPRESSION: 1. Cardiomegaly with interval worsening of the CHF since the prior radiograph. Superimposed pneumonia is not excluded. Clinical correlation is recommended. 2. Support devices in stable positioning. Electronically Signed   By: Elgie CollardArash  Radparvar M.D.   On: 12/26/2016 05:21       --Wells Guileseep Alder Murri, MD.  ICU Pager: [phone] [place]  [facility] Office Number: [phone]   12/27/2016

## 2016-12-27 NOTE — Progress Notes (Signed)
Pt extubated to 4L [place]. Pt following commands. Pt tolerated well.

## 2016-12-27 NOTE — Plan of Care (Signed)
Problem: Bowel/Gastric: Goal: Will not experience complications related to bowel motility Outcome: Not Progressing Multiple liquid bowel movements overnight. Patient had been receiving stool softeners and laxatives. Held those overnight.  Problem: Respiratory: Goal: Ability to maintain a clear airway and adequate ventilation will improve Outcome: Progressing Per plan of care discussion with NP, fentanyl and tube feedings held in morning for WUA and SBT. So far, patient resting comfortably, following commands, RASS 0. ABG pending for final decision by NP for extubation.

## 2016-12-27 NOTE — Progress Notes (Signed)
While coughing, patient had a 10 beat run of Vtach. Patient without any chest pain or other symptoms. Informed NP, who acknowledged and added magnesium and phosphorous to morning labs. Team will continue to monitor.

## 2016-12-28 DIAGNOSIS — J9602 Acute respiratory failure with hypercapnia: Secondary | ICD-10-CM

## 2016-12-28 LAB — CBC
HEMATOCRIT: 37.3 % (ref 35.0–47.0)
HEMOGLOBIN: 11.4 g/dL — AB (ref 12.0–16.0)
MCH: 26.4 pg (ref 26.0–34.0)
MCHC: 30.7 g/dL — AB (ref 32.0–36.0)
MCV: 86 fL (ref 80.0–100.0)
Platelets: 156 10*3/uL (ref 150–440)
RBC: 4.33 MIL/uL (ref 3.80–5.20)
RDW: 18.5 % — AB (ref 11.5–14.5)
WBC: 3.9 10*3/uL (ref 3.6–11.0)

## 2016-12-28 LAB — BASIC METABOLIC PANEL
ANION GAP: 8 (ref 5–15)
BUN: 23 mg/dL — ABNORMAL HIGH (ref 6–20)
CALCIUM: 8.9 mg/dL (ref 8.9–10.3)
CHLORIDE: 98 mmol/L — AB (ref 101–111)
CO2: 36 mmol/L — AB (ref 22–32)
CREATININE: 0.87 mg/dL (ref 0.44–1.00)
GFR calc non Af Amer: >60 mL/min (ref >60)
GLUCOSE: 100 mg/dL — AB (ref 65–99)
Potassium: 4.1 mmol/L (ref 3.5–5.1)
Sodium: 142 mmol/L (ref 135–145)

## 2016-12-28 LAB — GLUCOSE, CAPILLARY
GLUCOSE-CAPILLARY: 87 mg/dL (ref 65–99)
GLUCOSE-CAPILLARY: 123 mg/dL — AB (ref 65–99)
Glucose-Capillary: 131 mg/dL — ABNORMAL HIGH (ref 65–99)
Glucose-Capillary: 140 mg/dL — ABNORMAL HIGH (ref 65–99)
Glucose-Capillary: 157 mg/dL — ABNORMAL HIGH (ref 65–99)

## 2016-12-28 LAB — TRIGLYCERIDES: Triglycerides: 440 mg/dL — ABNORMAL HIGH (ref <150)

## 2016-12-28 NOTE — Progress Notes (Signed)
[facility] [place] Critical Care Medicine Progess Note    ASSESSMENT/PLAN   66 yo female with Perineal cellulitis/UTI severe septic shock, CHF with pulmonary edema, AKI,extubated 3/23 Will watch resp status in step down status, wean high flow [place] as tolerated fio2 70%  PULMONARY Remains hypoxic on high flow [place]  3/23 CXR images reviewed;  Continued Pulmonary edema and Reduced lung volumes due to obesity with bibasilar atelectasis minimally changed from yesterday.  P:   --Monitor respiratory status.  --Wean down oxygen.   CARDIOVASCULAR A: Septic shock improved, off pressors.  P:  Continue treatment of source.   RENAL A:  AKI, --improvement.  Good urine output.  P:   Monitor UOP, continue diuresis.  Diamox given 3/23 250 mg x 1.   GASTROINTESTINAL A:  Advance diet as tolerated.  P:     HEMATOLOGIC A:  Leukocytosis likely secondary to UTI with sepsis.  P:  --  INFECTIOUS A:  Klebsiella UTI with severe septic shock.  P:   Continue abx for klebsiella.    Micro/culture results: BCx2 3/18; klebsiella UC 3/18; pending.  Sputum 3/19: Pending MRSA screen 12/22/16; negative.   Antibiotics: Cefepime. 3/19>>3/21 Vancomycin 3/19>>3/21 Cafazoline 3/21>>  ENDOCRINE A:  Mild hyperglycemia, continue SSI.  P:     NEUROLOGIC A:  Metabolic encephalopathy improving.  P:   --Continue home risperdal.    MAJOR EVENTS/TEST RESULTS:   Best Practices  DVT Prophylaxis: heparin GI Prophylaxis: famotidine.    ---------------------------------------   ----------------------------------------   Name: Jasmine Osborn MRN: [id number] DOB: December 27, 1950    ADMISSION DATE:  12/22/2016    SUBJECTIVE:  Lethargic but arousable resp status stable at this time  Review of Systems:  Limited due to lethargy Denies chest pain No NVD   VITAL SIGNS: Temp:  [97.5 F (36.4 C)-99.2 F (37.3 C)] 98 F (36.7 C) (03/24 0400) Pulse Rate:  [94-108] 108 (03/24 0600) Resp:   [17-29] 29 (03/24 0600) BP: (95-141)/(42-83) 130/69 (03/24 0600) SpO2:  [90 %-97 %] 93 % (03/24 0600) FiO2 (%):  [55 %-75 %] 70 % (03/24 0437) Weight:  [269 lb 6.4 oz (122.2 kg)] 269 lb 6.4 oz (122.2 kg) (03/24 0359) HEMODYNAMICS:   VENTILATOR SETTINGS: FiO2 (%):  [55 %-75 %] 70 % INTAKE / OUTPUT:  Intake/Output Summary (Last 24 hours) at 12/28/16 0804 Last data filed at 12/27/16 2240  Gross per 24 hour  Intake              130 ml  Output             1525 ml  Net            -1395 ml    PHYSICAL EXAMINATION: Physical Examination:   VS: BP 130/69   Pulse (!) 108   Temp 98 F (36.7 C) (Axillary)   Resp (!) 29   Ht 5' 4.02" (1.626 m)   Wt 269 lb 6.4 oz (122.2 kg)   SpO2 93%   BMI 46.22 kg/m   General Appearance: No distress  Neuro:without focal findings, mental status awake, alert, follows commands.  HEENT: PERRLA, EOM intact. Pulmonary: Scattered rhonchi again bilaterally, similar to yesterday.  CardiovascularNormal S1,S2.  No m/r/g.   Abdomen: Benign, Soft, non-tender. Renal:  No costovertebral tenderness  GU:  Not performed at this time. Endocrine: No evident thyromegaly. Skin:   warm, no rashes, no ecchymosis  Extremities: normal,2+ bilat edema.    LABS:   LABORATORY PANEL:   CBC  Recent Labs Lab 12/28/16 0356  WBC 3.9  HGB 11.4*  HCT 37.3  PLT 156    Chemistries   Recent Labs Lab 12/22/16 1510  12/27/16 0424  12/28/16 0356  NA 136  < >  --   < > 142  K 4.4  < >  --   < > 4.1  CL 104  < >  --   < > 98*  CO2 25  < >  --   < > 36*  GLUCOSE 79  < >  --   < > 100*  BUN 49*  < >  --   < > 23*  CREATININE 3.50*  < >  --   < > 0.87  CALCIUM 9.2  < >  --   < > 8.9  MG  --   < > 1.3*  --   --   PHOS  --   < > 1.6*  --   --   AST 17  --   --   --   --   ALT 18  --   --   --   --   ALKPHOS 37*  --   --   --   --   BILITOT 1.0  --   --   --   --   < > = values in this interval not displayed.   Recent Labs Lab 12/27/16 0804 12/27/16 1140  12/27/16 1613 12/27/16 1940 12/27/16 2347 12/28/16 0353  GLUCAP 93 85 128* 115* 107* 87    Recent Labs Lab 12/23/16 0500 12/26/16 0510 12/27/16 0527  PHART 7.41 7.41 7.41  PCO2ART 30* 49* 59*  PO2ART 106 73* 96    Recent Labs Lab 12/22/16 1510  AST 17  ALT 18  ALKPHOS 37*  BILITOT 1.0  ALBUMIN 3.2*    Cardiac Enzymes  Recent Labs Lab 12/23/16 1215  TROPONINI 0.17*    RADIOLOGY:  Dg Chest 1 View  Result Date: 12/27/2016 CLINICAL DATA:  Shortness of breath. EXAM: CHEST 1 VIEW COMPARISON:  12/26/2016. FINDINGS: Endotracheal tube, left IJ line, NG tube in stable position. Cardiomegaly. Diffuse bilateral pulmonary infiltrates and small left pleural effusion again noted. No pneumothorax. IMPRESSION: 1. Lines and tubes in stable position. 2. Cardiomegaly with persistent bilateral pulmonary infiltrates consistent pulmonary edema. Small left pleural effusion again noted. Findings consistent with CHF. No interim change. Electronically Signed   By: Maisie Fushomas  Register   On: 12/27/2016 06:40     Kamber Vignola Santiago Gladavid Ramanda Paules, M.D.  [facility]  Medical Director [facility] Medical Director [facility]

## 2016-12-29 LAB — BLOOD GAS, ARTERIAL
ACID-BASE EXCESS: 11 mmol/L — AB (ref 0.0–2.0)
Bicarbonate: 39.9 mmol/L — ABNORMAL HIGH (ref 20.0–28.0)
FIO2: 0.32
O2 SAT: 94.4 %
PATIENT TEMPERATURE: 37
PCO2 ART: 74 mmHg — AB (ref 32.0–48.0)
pH, Arterial: 7.34 — ABNORMAL LOW (ref 7.350–7.450)
pO2, Arterial: 77 mmHg — ABNORMAL LOW (ref 83.0–108.0)

## 2016-12-29 LAB — BASIC METABOLIC PANEL
Anion gap: 7 (ref 5–15)
BUN: 22 mg/dL — AB (ref 6–20)
CO2: 36 mmol/L — ABNORMAL HIGH (ref 22–32)
Calcium: 8.9 mg/dL (ref 8.9–10.3)
Chloride: 96 mmol/L — ABNORMAL LOW (ref 101–111)
Creatinine, Ser: 0.91 mg/dL (ref 0.44–1.00)
GFR calc non Af Amer: >60 mL/min (ref >60)
GLUCOSE: 101 mg/dL — AB (ref 65–99)
Potassium: 3.6 mmol/L (ref 3.5–5.1)
SODIUM: 139 mmol/L (ref 135–145)

## 2016-12-29 LAB — CBC
HCT: 37.4 % (ref 35.0–47.0)
Hemoglobin: 11.8 g/dL — ABNORMAL LOW (ref 12.0–16.0)
MCH: 27 pg (ref 26.0–34.0)
MCHC: 31.5 g/dL — AB (ref 32.0–36.0)
MCV: 85.7 fL (ref 80.0–100.0)
PLATELETS: 164 10*3/uL (ref 150–440)
RBC: 4.36 MIL/uL (ref 3.80–5.20)
RDW: 18.5 % — ABNORMAL HIGH (ref 11.5–14.5)
WBC: 4.2 10*3/uL (ref 3.6–11.0)

## 2016-12-29 LAB — GLUCOSE, CAPILLARY
GLUCOSE-CAPILLARY: 108 mg/dL — AB (ref 65–99)
GLUCOSE-CAPILLARY: 120 mg/dL — AB (ref 65–99)
GLUCOSE-CAPILLARY: 129 mg/dL — AB (ref 65–99)
GLUCOSE-CAPILLARY: 140 mg/dL — AB (ref 65–99)
Glucose-Capillary: 147 mg/dL — ABNORMAL HIGH (ref 65–99)
Glucose-Capillary: 150 mg/dL — ABNORMAL HIGH (ref 65–99)

## 2016-12-29 LAB — PHOSPHORUS: Phosphorus: 3.3 mg/dL (ref 2.5–4.6)

## 2016-12-29 LAB — TRIGLYCERIDES: Triglycerides: 300 mg/dL — ABNORMAL HIGH (ref <150)

## 2016-12-29 LAB — MAGNESIUM: Magnesium: 1.5 mg/dL — ABNORMAL LOW (ref 1.7–2.4)

## 2016-12-29 MED ORDER — ASPIRIN 81 MG PO CHEW
81.0000 mg | CHEWABLE_TABLET | Freq: Every day | ORAL | Status: DC
  Administered 2016-12-29 – 2017-01-03 (×6): 81 mg via ORAL
  Filled 2016-12-29 (×6): qty 1

## 2016-12-29 MED ORDER — MEGESTROL ACETATE 40 MG PO TABS
80.0000 mg | ORAL_TABLET | Freq: Two times a day (BID) | ORAL | Status: DC
Start: 2016-12-29 — End: 2017-01-03
  Administered 2016-12-29 – 2017-01-03 (×11): 80 mg via ORAL
  Filled 2016-12-29 (×12): qty 2

## 2016-12-29 NOTE — Progress Notes (Signed)
RT called that pulse oximeter was beeping, upon entering room RT noted pt had taken probe off again. Probe reapplied and pt sleeping

## 2016-12-29 NOTE — Progress Notes (Signed)
eLink Physician-Brief Progress Note Patient Name: Jasmine DustmanDarlene O Parisi DOB: 10/28/1950 MRN: [id number]   Date of Service  12/29/2016  HPI/Events of Note  Variation in mental status  eICU Interventions  f/u ABG in AM     Intervention Category Major Interventions: Respiratory failure - evaluation and management  Shan Levansatrick Hermenia Fritcher 12/29/2016, 12:17 AM

## 2016-12-29 NOTE — Progress Notes (Signed)
Pt placed on over night pulse oximetry study at 2033. Pt dozing on and off, lights out in room. O2 turned off. At 2043 RT reapplied [place] at 2L as pt was steadily dropping SpO2. SpO2 at 81% when RT placed pt back on [place]. SpO2 immediately increased once O2 reapplied. Pt sleeping when RT left room. Bipap ordered for nightly use but will hold off tonight for overnight study. RN aware.

## 2016-12-29 NOTE — Progress Notes (Signed)
[facility] [place] [facility]   PATIENT NAME: Jasmine Osborn    MR#:  [id number]  DATE OF BIRTH:  [birth year]  SUBJECTIVE: Transfer from ICU yesterday. Admitted to intensive care unit for acute hypercapnic respiratory failure requiring intubation, also had septic shock. Extubated, transferred to telemetry. Patient has klebsiella UTI. Now  On 3 L of oxygen saturation is more than 90%. Episode of desaturation this morning. Clinically improved. I suspect she has sleep apnea. Patient taking shallow breaths but she is alert awake oriented, able to eat diabetic diet.   CHIEF COMPLAINT:   Chief Complaint  Patient presents with  . Weakness    REVIEW OF SYSTEMS:   ROS CONSTITUTIONAL: No fever, fatigue or weakness.  EYES: No blurred or double vision.  EARS, NOSE, AND THROAT: No tinnitus or ear pain.  RESPIRATORY: No cough, shortness of breath, wheezing or hemoptysis.  CARDIOVASCULAR: No chest pain, orthopnea, edema.  GASTROINTESTINAL: No nausea, vomiting, diarrhea or abdominal pain.  GENITOURINARY: No dysuria, hematuria.  ENDOCRINE: No polyuria, nocturia,  HEMATOLOGY: No anemia, easy bruising or bleeding SKIN: No rash or lesion. MUSCULOSKELETAL: Edema of the legs. NEUROLOGIC: No tingling, numbness, weakness.  PSYCHIATRY: No anxiety or depression.   DRUG ALLERGIES:  No Known Allergies  VITALS:  Blood pressure (!) 131/50, pulse (!) 116, temperature 98.6 F (37 C), temperature source Oral, resp. rate 16, height 5' 4.02" (1.626 m), weight 120.6 kg (265 lb 12.8 oz), SpO2 94 %.  PHYSICAL EXAMINATION:  GENERAL:  66 y.o.-year-old patient lying in the bed with no acute distress.morbidly obese female not in distress  EYES: Pupils equal, round, reactive to light . No scleral icterus. Extraocular muscles intact.  HEENT: Head atraumatic, normocephalic. Oropharynx and nasopharynx clear.  NECK:  Supple, no jugular venous distention. No thyroid enlargement, no  tenderness.  LUNGS: Decreased breath sounds bilaterally, no wheezing, no rales, not using accessory muscles of respiration.  CARDIOVASCULAR: S1, S2 normal. No murmurs, rubs, or gallops.  ABDOMEN: Soft, nontender, nondistended. Bowel sounds present. No organomegaly or mass.  EXTREMITIES: No pedal edema, cyanosis, or clubbing.  NEUROLOGIC: Cranial nerves II through XII are intact. Muscle strength 5/5 in all extremities. Sensation intact. Gait not checked.  PSYCHIATRIC: The patient is alert and oriented x 3.  SKIN: No obvious rash, lesion, or ulcer.    LABORATORY PANEL:   CBC  Recent Labs Lab 12/29/16 0637  WBC 4.2  HGB 11.8*  HCT 37.4  PLT 164   ------------------------------------------------------------------------------------------------------------------  Chemistries   Recent Labs Lab 12/22/16 1510  12/29/16 0637  NA 136  < > 139  K 4.4  < > 3.6  CL 104  < > 96*  CO2 25  < > 36*  GLUCOSE 79  < > 101*  BUN 49*  < > 22*  CREATININE 3.50*  < > 0.91  CALCIUM 9.2  < > 8.9  MG  --   < > 1.5*  AST 17  --   --   ALT 18  --   --   ALKPHOS 37*  --   --   BILITOT 1.0  --   --   < > = values in this interval not displayed. ------------------------------------------------------------------------------------------------------------------  Cardiac Enzymes  Recent Labs Lab 12/23/16 1215  TROPONINI 0.17*   ------------------------------------------------------------------------------------------------------------------  RADIOLOGY:  No results found.  EKG:   Orders placed or performed during the hospital encounter of 12/22/16  . ED EKG within 10 minutes  . ED EKG within 10 minutes  .  EKG 12-Lead  . EKG 12-Lead    ASSESSMENT AND PLAN:  1 acute hypercapnic respiratory failure status post intubation and extubation. Continue oxygen 2-3 L to keep saturation more than 90%.  #2 COPD exacerbation: Has no wheezing at this time.  #3 septic shock: Secondary to klebsiella  UTI. Patient also required pressors at one point., WBC normalized.  #4 elevated troponin due to demand ischemia. Echocardiogram showed EF more than 60% has  pulmonary hypertension. #5 acute on chronic diastolic heart failure: Continue diuretics.  #6 acute renal failure secondary to septic shock: Kidney function improved with hydration, treating the sepsis, patient having good urine output.. Patient home medicine losartan metformin is still on hold. Resume at discharge.  7 metabolic encephalopathy: Improving, continue home dose Risperdal.  8 diabetes mellitus type 2: Hold the metformin because of recent recovery from renal failure, continue sliding scale with coverage, restart Amaryl from tomorrow.  History of depression: Continue Remeron, Risperdal.  10. morbid obesity,,possible sleep apnoea;; evaluate overnight pulse oximetry,  #11 deconditioning: Physical therapy consult today. Discussed with patient's family at bedside.,    All the records are reviewed and case discussed with Care Management/Social Workerr. Management plans discussed with the patient, family and they are in agreement.  CODE STATUS: full  TOTAL TIME TAKING CARE OF THIS PATIENT: 35 minutes.   POSSIBLE D/C IN 2-3DAYS, DEPENDING ON CLINICAL CONDITION.   Katha HammingKONIDENA,Anela Bensman M.D on 12/29/2016 at 11:33 AM  Between 7am to 6pm - Pager - [phone]  After 6pm go to [email or web address] - password [email or web address]  [facility] [place] [facility]  Office  [phone]  CC: Primary care physician; [name],MARSHALL W., MD   Note: This dictation was prepared with Dragon dictation along with smaller phrase technology. Any transcriptional errors that result from this process are unintentional.

## 2016-12-29 NOTE — Progress Notes (Signed)
RT heard pulse oximetry machine beeping and entered room to find pt to have had a bowel movement in the bed, taken finger probe off and was very confused. Pt stated she did not know where she was and she was at a friends house visiting. Finger probe reapplied, SpO2 88%, RT increased [place] to 3L and notified RN. RN Claris GowerCharlotte stated pt had been confused and this was not new, pt to be cleaned up from bowel movement.

## 2016-12-29 NOTE — Plan of Care (Signed)
Problem: Physical Regulation: Goal: Will remain free from infection Outcome: Not Progressing Patient has a significant UTI. Jasmine FavreSteven M [facility]

## 2016-12-29 NOTE — Progress Notes (Signed)
Physical Therapy Evaluation Patient Details Name: Jasmine DustmanDarlene O Osborn MRN: [id number] DOB: 06/26/1951 Today's Date: 12/29/2016   History of Present Illness  Patient is a 66 y.o. female admitted on 18 March for respiratory failure w/hypercapnia. Was intubated and extubated on 23 March. Patient had a UTI.  Clinical Impression  Patient is a pleasant female admitted for above listed reasons. On evaluation, patient's resting HR 115 bpm. Able to perform bed mobility with moderate assistance and +2 assistance to scoot to [facility]. Patient's HR elevated to 130 bpm upon sit to stand transfer, so gait assessment deferred. Patient also has a pressure ulcer on L heel, so pressure from standing was not comfortable for her. Because patient is not at baseline level of function, it is believed that she will benefit from progressive strength and mobility training with f/u rehabilitation at SNF upon discharge to allow her to return home safely when medically ready.    Follow Up Recommendations SNF    Equipment Recommendations  None recommended by PT    Recommendations for Other Services       Precautions / Restrictions Precautions Precautions: Fall Restrictions Weight Bearing Restrictions: No Other Position/Activity Restrictions: L heel pressure ulcer      Mobility  Bed Mobility Overal bed mobility: Needs Assistance Bed Mobility: Rolling;Supine to Sit;Sit to Supine Rolling: Mod assist   Supine to sit: Mod assist Sit to supine: Mod assist;+2 for physical assistance   General bed mobility comments: Patient requires moderate assistance for most aspects of bed mobility. Utilizes bed rails for pulling to EOB. Required +2 assistance to slide to [facility], unable to bridge through LEs/pull with UEs.  Transfers Overall transfer level: Needs assistance Equipment used: Rolling walker (2 wheeled) Transfers: Sit to/from Stand Sit to Stand: Min assist         General transfer comment: Patient moves from sit to  stand and stand to sit with minimal assistance. Slight R lateral lean due to pressure ulcer on L heel. HR elevated from 115 to 130 bpm upon standing.  Ambulation/Gait             General Gait Details: Deferred due to HR elevation  Stairs            Wheelchair Mobility    Modified Rankin (Stroke Patients Only)       Balance Overall balance assessment: Needs assistance Sitting-balance support: Bilateral upper extremity supported Sitting balance-Leahy Scale: Fair     Standing balance support: Bilateral upper extremity supported Standing balance-Leahy Scale: Fair                               Pertinent Vitals/Pain Pain Assessment: No/denies pain    Home Living Family/patient expects to be discharged to:: Private residence Living Arrangements: Spouse/significant other Available Help at Discharge: Family;Available PRN/intermittently Type of Home: House Home Access: Stairs to enter Entrance Stairs-Rails: Right Entrance Stairs-Number of Steps: 4 Home Layout: One level Home Equipment: Walker - 2 wheels      Prior Function Level of Independence: Needs assistance   Gait / Transfers Assistance Needed: Performed with RW for household distances  ADL's / Homemaking Assistance Needed: Performed by husband        Hand Dominance        Extremity/Trunk Assessment   Upper Extremity Assessment Upper Extremity Assessment: Generalized weakness (4-/5)    Lower Extremity Assessment Lower Extremity Assessment: Generalized weakness (3 to 3+/5)       Communication  Communication: No difficulties  Cognition Arousal/Alertness: Awake/alert Behavior During Therapy: WFL for tasks assessed/performed Overall Cognitive Status: Within Functional Limits for tasks assessed                                        General Comments      Exercises     Assessment/Plan    PT Assessment Patient needs continued PT services  PT Problem List  Decreased strength;Decreased activity tolerance;Decreased balance;Decreased mobility;Decreased knowledge of use of DME;Cardiopulmonary status limiting activity;Decreased skin integrity;Pain;Obesity       PT Treatment Interventions DME instruction;Gait training;Stair training;Functional mobility training;Therapeutic activities;Therapeutic exercise;Balance training;Patient/family education    PT Goals (Current goals can be found in the Care Plan section)  Acute Rehab PT Goals Patient Stated Goal: "To feel better" PT Goal Formulation: With patient Time For Goal Achievement: 01/12/17 Potential to Achieve Goals: Fair    Frequency Min 2X/week   Barriers to discharge Inaccessible home environment;Decreased caregiver support      Co-evaluation               End of Session Equipment Utilized During Treatment: Gait belt;Oxygen Activity Tolerance: Patient tolerated treatment well;Treatment limited secondary to medical complications (Comment);Other (comment) (HR) Patient left: in bed;with call bell/phone within reach;with bed alarm set;Other (comment) (w/respiratory therapist) Nurse Communication: Mobility status PT Visit Diagnosis: Muscle weakness (generalized) (M62.81);Pain;Difficulty in walking, not elsewhere classified (R26.2) Pain - Right/Left: Left Pain - part of body: Ankle and joints of foot    Time: 1610-9604 PT Time Calculation (min) (ACUTE ONLY): 26 min   Charges:   PT Evaluation $PT Eval Low Complexity: 1 Procedure     PT G Codes:          Neita Carp, PT, DPT 12/29/2016, 4:01 PM

## 2016-12-29 NOTE — Progress Notes (Signed)
Patient had pulled BiPap off and Janeann ForehandSteve RN was placing patient on her [place] when Rt entered room. Patients room air sat was 68% with good waveform. Patient was increased to 4l [place] and treatment given.

## 2016-12-29 NOTE — Progress Notes (Signed)
Patient transferred from ICU to room 254. Alert and oriented x 4. Denied any acute pain. No respiratory distress noted. Tele box called to CCMD with Milinda AntisYakana C RN. as a second verified Skin assessment done with Cherylynn RidgesYakana as well and observed MASD on bilateral groins, sacrum and abdominal fold and wound on left foot.  Patient is on BIPAP now and voiced no concern. Will continue to monitor.

## 2016-12-29 NOTE — Progress Notes (Signed)
eLink Physician-Brief Progress Note Patient Name: Jasmine DustmanDarlene O Mcgonagle DOB: 06/07/1951 MRN: [id number]   Date of Service  12/29/2016  HPI/Events of Note  Pt with rising pCO2  eICU Interventions  NP to see the patient     Intervention Category Major Interventions: Respiratory failure - evaluation and management  Shan Levansatrick Wright 12/29/2016, 5:10 AM

## 2016-12-29 NOTE — Progress Notes (Signed)
Patients sat on 4l was 94%, Decreased to 3l;  observed patient for 5-6 minutes with saturations remaining 92-93% on the 3l. Left patient at 3L [place] and Scientist, clinical (histocompatibility and immunogenetics)teve RN notified.

## 2016-12-29 NOTE — Progress Notes (Signed)
[facility] [place] Critical Care Medicine Progess Note    ASSESSMENT/PLAN   66 yo female with Perineal cellulitis/UTI severe septic shock, CHF with pulmonary edema, AKI,extubated 3/23 Will watch resp status in step down status, wean high flow [place] as tolerated fio2 70%  PULMONARY Remains hypoxic on high flow [place]  3/23 CXR images reviewed;  Continued Pulmonary edema and Reduced lung volumes due to obesity with bibasilar atelectasis minimally changed from yesterday.  P:   --Monitor respiratory status.  --Wean down oxygen.  -Mandatory BiPAP at HS  CARDIOVASCULAR A: Septic shock improved, off pressors.  P:  Continue treatment of source.   RENAL A:  AKI, --improvement.  Good urine output.  P:   Monitor UOP, continue diuresis.  Diamox given 3/23 250 mg x 1.   GASTROINTESTINAL A:  Advance diet as tolerated.  P:     HEMATOLOGIC A:  Leukocytosis likely secondary to UTI with sepsis.  P:  --  INFECTIOUS A:  Klebsiella UTI with severe septic shock.  P:   Continue abx for klebsiella.    Micro/culture results: BCx2 3/18; klebsiella UC 3/18; pending.  Sputum 3/19: Pending MRSA screen 12/22/16; negative.   Antibiotics: Cefepime. 3/19>>3/21 Vancomycin 3/19>>3/21 Cafazoline 3/21>>  ENDOCRINE A:  Mild hyperglycemia, continue SSI.  P:     NEUROLOGIC A:  Metabolic encephalopathy improving.  P:   --Continue home risperdal.  -Continue to monitor mental status and correct CO2 retention   MAJOR EVENTS/TEST RESULTS:   Best Practices  DVT Prophylaxis: heparin GI Prophylaxis: famotidine.    Patient is stable for floor transfer. Sign out given to Dr. Felton Clinton at   ---------------------------------------   ----------------------------------------   Name: Jasmine Osborn MRN: [id number] DOB: [birth year]    ADMISSION DATE:  12/22/2016    SUBJECTIVE:  Lethargic but arousable resp status stable at this time  Review of Systems:  Limited due to lethargy Denies  chest pain No NVD   VITAL SIGNS: Temp:  [97.7 F (36.5 C)-98.2 F (36.8 C)] 98.2 F (36.8 C) (03/25 0440) Pulse Rate:  [97-112] 97 (03/25 0440) Resp:  [17-30] 20 (03/25 0440) BP: (96-139)/(43-81) 137/73 (03/25 0440) SpO2:  [88 %-100 %] 100 % (03/25 0526) FiO2 (%):  [40 %-48 %] 40 % (03/24 1528) Weight:  [265 lb 12.8 oz (120.6 kg)] 265 lb 12.8 oz (120.6 kg) (03/25 0440) HEMODYNAMICS:   VENTILATOR SETTINGS: FiO2 (%):  [40 %-48 %] 40 % INTAKE / OUTPUT:  Intake/Output Summary (Last 24 hours) at 12/29/16 0724 Last data filed at 12/28/16 2231  Gross per 24 hour  Intake               60 ml  Output                0 ml  Net               60 ml    PHYSICAL EXAMINATION: Physical Examination:   VS: BP 137/73   Pulse 97   Temp 98.2 F (36.8 C) (Oral)   Resp 20   Ht 5' 4.02" (1.626 m)   Wt 265 lb 12.8 oz (120.6 kg)   SpO2 100%   BMI 45.60 kg/m   General Appearance: No distress  Neuro:without focal findings, mental status awake, alert, follows commands.  HEENT: PERRLA, EOM intact. Pulmonary: Scattered rhonchi again bilaterally, similar to yesterday.  CardiovascularNormal S1,S2.  No m/r/g.   Abdomen: Benign, Soft, non-tender. Renal:  No costovertebral tenderness  GU:  Not performed at this time. Endocrine:  No evident thyromegaly. Skin:   warm, no rashes, no ecchymosis  Extremities: normal,2+ bilat edema.    LABS:   LABORATORY PANEL:   CBC  Recent Labs Lab 12/28/16 0356  WBC 3.9  HGB 11.4*  HCT 37.3  PLT 156    Chemistries   Recent Labs Lab 12/22/16 1510  12/27/16 0424  12/28/16 0356  NA 136  < >  --   < > 142  K 4.4  < >  --   < > 4.1  CL 104  < >  --   < > 98*  CO2 25  < >  --   < > 36*  GLUCOSE 79  < >  --   < > 100*  BUN 49*  < >  --   < > 23*  CREATININE 3.50*  < >  --   < > 0.87  CALCIUM 9.2  < >  --   < > 8.9  MG  --   < > 1.3*  --   --   PHOS  --   < > 1.6*  --   --   AST 17  --   --   --   --   ALT 18  --   --   --   --   ALKPHOS 37*   --   --   --   --   BILITOT 1.0  --   --   --   --   < > = values in this interval not displayed.   Recent Labs Lab 12/28/16 0353 12/28/16 1213 12/28/16 1620 12/28/16 1951 12/28/16 2344 12/29/16 0348  GLUCAP 87 123* 140* 157* 131* 120*    Recent Labs Lab 12/26/16 0510 12/27/16 0527 12/29/16 0500  PHART 7.41 7.41 7.34*  PCO2ART 49* 59* 74*  PO2ART 73* 96 77*    Recent Labs Lab 12/22/16 1510  AST 17  ALT 18  ALKPHOS 37*  BILITOT 1.0  ALBUMIN 3.2*    Cardiac Enzymes  Recent Labs Lab 12/23/16 1215  TROPONINI 0.17*    RADIOLOGY:  No results found.

## 2016-12-29 NOTE — Progress Notes (Signed)
Changed patient's L foot wound dressing at this time, as ordered daily. Patient tolerated well. Jasmine FavreSteven M [facility]

## 2016-12-30 LAB — GLUCOSE, CAPILLARY
GLUCOSE-CAPILLARY: 99 mg/dL (ref 65–99)
GLUCOSE-CAPILLARY: 170 mg/dL — AB (ref 65–99)
GLUCOSE-CAPILLARY: 158 mg/dL — AB (ref 65–99)
Glucose-Capillary: 83 mg/dL (ref 65–99)
Glucose-Capillary: 112 mg/dL — ABNORMAL HIGH (ref 65–99)
Glucose-Capillary: 171 mg/dL — ABNORMAL HIGH (ref 65–99)

## 2016-12-30 MED ORDER — INSULIN ASPART 100 UNIT/ML ~~LOC~~ SOLN
0.0000 [IU] | Freq: Three times a day (TID) | SUBCUTANEOUS | Status: DC
  Administered 2016-12-30 (×2): 4 [IU] via SUBCUTANEOUS
  Administered 2016-12-31: 3 [IU] via SUBCUTANEOUS
  Administered 2016-12-31: 11 [IU] via SUBCUTANEOUS
  Administered 2016-12-31: 4 [IU] via SUBCUTANEOUS
  Administered 2016-12-31 – 2017-01-01 (×2): 11 [IU] via SUBCUTANEOUS
  Administered 2017-01-01: 15 [IU] via SUBCUTANEOUS
  Administered 2017-01-01: 7 [IU] via SUBCUTANEOUS
  Administered 2017-01-01 – 2017-01-02 (×2): 11 [IU] via SUBCUTANEOUS
  Administered 2017-01-02: 15 [IU] via SUBCUTANEOUS
  Administered 2017-01-02: 7 [IU] via SUBCUTANEOUS
  Administered 2017-01-02 – 2017-01-03 (×2): 11 [IU] via SUBCUTANEOUS
  Administered 2017-01-03: 4 [IU] via SUBCUTANEOUS
  Filled 2016-12-30 (×3): qty 11
  Filled 2016-12-30 (×3): qty 4
  Filled 2016-12-30 (×2): qty 7
  Filled 2016-12-30: qty 11
  Filled 2016-12-30: qty 15
  Filled 2016-12-30 (×2): qty 11
  Filled 2016-12-30: qty 4
  Filled 2016-12-30: qty 15
  Filled 2016-12-30: qty 3
  Filled 2016-12-30: qty 4
  Filled 2016-12-30: qty 7
  Filled 2016-12-30: qty 11

## 2016-12-30 MED ORDER — TORSEMIDE 20 MG PO TABS
60.0000 mg | ORAL_TABLET | Freq: Two times a day (BID) | ORAL | Status: AC
  Administered 2016-12-30 (×2): 60 mg via ORAL
  Filled 2016-12-30 (×2): qty 3

## 2016-12-30 MED ORDER — METOPROLOL TARTRATE 50 MG PO TABS
50.0000 mg | ORAL_TABLET | Freq: Two times a day (BID) | ORAL | Status: DC
  Administered 2016-12-30 – 2017-01-03 (×9): 50 mg via ORAL
  Filled 2016-12-30 (×9): qty 1

## 2016-12-30 MED ORDER — TORSEMIDE 20 MG PO TABS
20.0000 mg | ORAL_TABLET | Freq: Two times a day (BID) | ORAL | Status: DC
  Administered 2016-12-31: 20 mg via ORAL
  Filled 2016-12-30: qty 1

## 2016-12-30 MED ORDER — JUVEN PO PACK
1.0000 | PACK | Freq: Two times a day (BID) | ORAL | Status: DC
  Administered 2016-12-30 – 2017-01-03 (×9): 1 via ORAL

## 2016-12-30 NOTE — Progress Notes (Signed)
Nutrition Follow-up  DOCUMENTATION CODES:   Morbid obesity  INTERVENTION:  1. Provide Juven 1 PKT BID, each supplement provides 15gm protein and 80 calories, will help with wound healing.  NUTRITION DIAGNOSIS:   Inadequate oral intake related to acute illness as evidenced by NPO status. -resolved  GOAL:   Provide needs based on ASPEN/SCCM guidelines -resolved  MONITOR:   Labs, Weight trends, Vent status, TF tolerance  ASSESSMENT:   Admitted to intensive care unit for acute hypercapnic respiratory failure requiring intubation, also had septic shock. Extubated, transferred to telemetry. Patient has klebsiella UTI.   Eating well. Had 100% of Eggs, Biscuit, Tomasa BlaseBacon, Oatmeal this morning. Ate 100% of Lunch as well. Still continues with wound to heel - Juven to help heal. No other acute complaints. Labs and medications reviewed.  Diet Order:  Diet Carb Modified Fluid consistency: Thin; Room service appropriate? Yes  Skin:  Wound (see comment) (stage III on heel)  Last BM:  12/25/16  Height:   Ht Readings from Last 1 Encounters:  12/22/16 5' 4.02" (1.626 m)    Weight:   Wt Readings from Last 1 Encounters:  12/30/16 256 lb 9.6 oz (116.4 kg)    Ideal Body Weight:     BMI:  Body mass index is 44.02 kg/m.  Estimated Nutritional Needs:   Kcal:  [id number] kcals  Protein:  110-138 g  Fluid:  >/= 1.6 L  EDUCATION NEEDS:   Education needs addressed  Dionne AnoWilliam M. Keilee Denman, MS, RD LDN Inpatient Clinical Dietitian Pager [phone]

## 2016-12-30 NOTE — Care Management Important Message (Signed)
Important Message  Patient Details  Name: Jasmine Osborn MRN: [id number] Date of Birth: 07/07/1951   Medicare Important Message Given:  Yes  Signed copy    Eber HongGreene, Zylan Almquist R, RN 12/30/2016, 5:49 PM

## 2016-12-30 NOTE — Plan of Care (Signed)
Problem: Respiratory: Goal: Ability to maintain a clear airway and adequate ventilation will improve Outcome: Progressing On 4L [place] and maintaining O2 sats in the 90's.  Receiving diuretics to reduce the fluid in her lungs

## 2016-12-30 NOTE — Progress Notes (Signed)
[facility]   PATIENT NAME: Jasmine Osborn    MR#:  [id number]  DATE OF BIRTH:  29-Sep-1951   Admitted to intensive care unit for acute hypercapnic respiratory failure requiring intubation, also had septic shock. Extubated, transferred to telemetry. Patient has klebsiella UTI.   CHIEF COMPLAINT:   Chief Complaint  Patient presents with  . Weakness   Feels weak. Afebrile. On 4 L oxygen. 72% on room air. No pain.  REVIEW OF SYSTEMS:   ROS CONSTITUTIONAL: fatigue EYES: No blurred or double vision.  EARS, NOSE, AND THROAT: No tinnitus or ear pain.  RESPIRATORY: SOB CARDIOVASCULAR: No chest pain, orthopnea, edema.  GASTROINTESTINAL: No nausea, vomiting, diarrhea or abdominal pain.  GENITOURINARY: No dysuria, hematuria.  ENDOCRINE: No polyuria, nocturia,  HEMATOLOGY: No anemia, easy bruising or bleeding SKIN: No rash or lesion. MUSCULOSKELETAL: Edema of the legs. NEUROLOGIC: No tingling, numbness, weakness.  PSYCHIATRY: No anxiety or depression.   DRUG ALLERGIES:  No Known Allergies  VITALS:  Blood pressure 113/63, pulse (!) 113, temperature 98.7 F (37.1 C), temperature source Oral, resp. rate 20, height 5' 4.02" (1.626 m), weight 116.4 kg (256 lb 9.6 oz), SpO2 94 %.  PHYSICAL EXAMINATION:  GENERAL:  66 y.o.-year-old patient lying in the bed with no acute distress.morbidly obese female not in distress  EYES: Pupils equal, round, reactive to light . No scleral icterus. Extraocular muscles intact.  HEENT: Head atraumatic, normocephalic. Oropharynx and nasopharynx clear.  NECK:  Supple, no jugular venous distention. No thyroid enlargement, no tenderness.  LUNGS: Decreased breath sounds bilaterally, no wheezing, no rales, not using accessory muscles of respiration.  CARDIOVASCULAR: S1, S2 normal. No murmurs, rubs, or gallops.  ABDOMEN: Soft, nontender, nondistended. Bowel sounds present. No organomegaly or mass.  EXTREMITIES: No  pedal edema, cyanosis, or clubbing.  NEUROLOGIC: Cranial nerves II through XII are intact. Muscle strength 5/5 in all extremities. Sensation intact. Gait not checked.  PSYCHIATRIC: The patient is alert and awake SKIN: No obvious rash, lesion, or ulcer.    LABORATORY PANEL:   CBC  Recent Labs Lab 12/29/16 0637  WBC 4.2  HGB 11.8*  HCT 37.4  PLT 164   ------------------------------------------------------------------------------------------------------------------  Chemistries   Recent Labs Lab 12/29/16 0637  NA 139  K 3.6  CL 96*  CO2 36*  GLUCOSE 101*  BUN 22*  CREATININE 0.91  CALCIUM 8.9  MG 1.5*   ------------------------------------------------------------------------------------------------------------------  Cardiac Enzymes No results for input(s): TROPONINI in the last 168 hours. ------------------------------------------------------------------------------------------------------------------  RADIOLOGY:  No results found.  EKG:   Orders placed or performed during the hospital encounter of 12/22/16  . ED EKG within 10 minutes  . ED EKG within 10 minutes  . EKG 12-Lead  . EKG 12-Lead    ASSESSMENT AND PLAN:  * Acute hypercapnic respiratory failure status post intubation and extubation. Continue oxygen 2-3 L to keep saturation more than 90%.  * COPD exacerbation - resolved  # Septic shock: Secondary to klebsiella UTI.  Shock has resolved On IV abx  # Elevated troponin due to demand ischemia. Echocardiogram showed EF more than 60% has pulmonary hypertension.  # Acute on chronic diastolic heart failure: Continue torsemide.  # Acute renal failure secondary to septic shock: Kidney function improved with hydration, treating the sepsis, patient having good urine output. Patient home medicine losartan metformin is still on hold. Resume at discharge.  # Metabolic encephalopathy: Improving, continue home dose Risperdal.  # diabetes mellitus type  2 SSI  #  Morbid obesity possible sleep apnea  # deconditioning SNF at discharge   All the records are reviewed and case discussed with Care Management/Social Workerr. Management plans discussed with the patient, family and they are in agreement.  CODE STATUS: full  TOTAL TIME TAKING CARE OF THIS PATIENT: 35 minutes.   POSSIBLE D/C IN 2-3DAYS, DEPENDING ON CLINICAL CONDITION.  Milagros LollSudini, [place] Kuang R M.D on 12/30/2016 at 1:47 PM  Between 7am to 6pm - Pager - [phone]  After 6pm go to [email or web address] - password [email or web address]  Fabio Neighbors[facility] [place] [facility]  Office  [phone]  CC: Primary care physician; [name],MARSHALL W., MD   Note: This dictation was prepared with Dragon dictation along with smaller phrase technology. Any transcriptional errors that result from this process are unintentional.

## 2016-12-30 NOTE — Progress Notes (Signed)
qPhysical Therapy Treatment Patient Details Name: Jasmine DustmanDarlene O Osborn MRN: [id number] DOB: 08/13/1951 Today's Date: 12/30/2016    History of Present Illness Patient is a 66 y.o. female admitted on 18 March for respiratory failure w/hypercapnia. Was intubated and extubated on 23 March. Patient had a UTI.    PT Comments    Pt sleeping, but easily awoken through voice. Pt remains fatigued/lethargic, but participates well. Pt wishes to get up to edge of bed. Requires Min A and increased time to get to edge of bed. Participates well with seated exercises with limited range requiring some assist. Rest periods between sets to manage increased heart rate from resting heart rate. (resting 103; increased to 155-124 beats per minute with mild activity) Pt denies lightheadedness, but does note feeling heart rate increased/racing.  Pt request return to bed for bedpan post exercises. Requires Mod A for sit to supine and Max A with use of bed in dependent head position to return to proper position in bed. Continue PT progressing endurance with heart rate is acceptable ranges and strength to improve functional mobility out of bed as appropriate.    Follow Up Recommendations  SNF     Equipment Recommendations  None recommended by PT    Recommendations for Other Services       Precautions / Restrictions Precautions Precautions: Fall Restrictions Weight Bearing Restrictions: No    Mobility  Bed Mobility Overal bed mobility: Needs Assistance Bed Mobility: Supine to Sit Rolling: Min assist;Mod assist   Supine to sit: Min assist Sit to supine: Mod assist   General bed mobility comments: increased time once sittine to scoot to edge of bed. Increased assist to return to supine. Heart rate increases to 124 beats per min.   Transfers                 General transfer comment: Not tested; post exercise, pt wishes to return to supine  Ambulation/Gait                 Stairs            Wheelchair Mobility    Modified Rankin (Stroke Patients Only)       Balance Overall balance assessment: Needs assistance Sitting-balance support: Bilateral upper extremity supported;Feet supported Sitting balance-Leahy Scale: Fair                                      Cognition Arousal/Alertness: Lethargic Behavior During Therapy: WFL for tasks assessed/performed Overall Cognitive Status: Within Functional Limits for tasks assessed                                        Exercises      General Comments General comments (skin integrity, edema, etc.): LLE discoloration;foot ulcer      Pertinent Vitals/Pain Pain Assessment: No/denies pain    Home Living                      Prior Function            PT Goals (current goals can now be found in the care plan section)      Frequency    Min 2X/week      PT Plan Current plan remains appropriate    Co-evaluation  End of Session Equipment Utilized During Treatment: Oxygen Activity Tolerance: Patient limited by fatigue;Other (comment) (need to use bed pan) Patient left: in bed;with call bell/phone within reach;with bed alarm set   PT Visit Diagnosis: Muscle weakness (generalized) (M62.81);Pain;Difficulty in walking, not elsewhere classified (R26.2)     Time: 1610-9604 PT Time Calculation (min) (ACUTE ONLY): 30 min  Charges:  $Therapeutic Exercise: 8-22 mins $Therapeutic Activity: 8-22 mins                    G CodesScot Dock, PTA 12/30/2016, 2:54 PM

## 2016-12-30 NOTE — Care Management (Signed)
Patient transferred to 2A from ICU.  Had overnight oximetry last pm.  Started on room air and sats dropped to 81% within 10 mins.  The rest of the test was performed on 2 liters nasal cannula.  Has documentation of suspected sleep  apnea/hypoventilation syndrome but has not had any work up.  No previous history of copd.  There is documentation of reduced lung capacity due to obesity and heart failure/pulmonary edema. Noted to have atelectasis on CXR also.   Patient mentioned wanting that humidified oxygen again that went in her nose.  CM discussed that what patient appeared to be describing was high flow nasal cannula oxygen and that she did not need this therapy at presesnt time.   Physical therapy has recommended  skilled nursing placement but patient says she wants to discharge home.  Discussed that her functional status would have to be much improved inorder to discharge home with her husband as primary caregiver.  Discussed at present, she would required 24/7 round the clock supervision.  Says her husband  is retiring and can provide support.  CM has not spoken with patient's husband yet.  Anticipate need for home 02 and may need to have outpatient sleep study performed.  Says she has a wheelchair and must be able to navigate 5 steps to get inside her home.  patient is not sure if she will require hospital bed.

## 2016-12-30 NOTE — Progress Notes (Signed)
SATURATION QUALIFICATIONS: (This note is used to comply with regulatory documentation for home oxygen)  Patient Saturations on Room Air at Rest = 72% Patient Saturations on Room Air while Ambulating = n/a Patient Saturations on Liters of oxygen while Ambulating = n/a Please briefly explain why patient needs home oxygen:

## 2016-12-30 NOTE — Care Management (Addendum)
Spoke with patient's husband and he says that he can not provide round the clock care for his wife because he has not retired yet. Physical therapy has again recommended skilled nursing.  CM had made a heads up referral to [facility] for [facility] prior to this was was informed that patient's medicare does not go into effect until may 2018 and the [employer] policy termed 12/21/2016.  Obtained copy of medicare card from husband- Patient has medicare A since 11/2015 and B goes into effect May 1- when he officially retires.  Husband has contacted [employer] and [facility] but it may take 24 hours for "it to show up."  Patient is now agreeable to short term rehab and placed csw referral. Notified pre cert dept of possible concerns regarding insurance

## 2016-12-30 NOTE — Progress Notes (Signed)
PT Cancellation Note  Patient Details Name: Jasmine DustmanDarlene O Briggs MRN: [id number] DOB: 01/06/1951   Cancelled Treatment:    Reason Eval/Treat Not Completed: Other (comment). Treatment attempted twice this morning; pt with other staff each time with request to return at a later time. Will re attempt this afternoon.    Scot DockHeidi E Brooklen Runquist, PTA 12/30/2016, 12:51 PM

## 2016-12-31 ENCOUNTER — Inpatient Hospital Stay: Payer: 59

## 2016-12-31 LAB — BASIC METABOLIC PANEL
Anion gap: 9 (ref 5–15)
BUN: 22 mg/dL — ABNORMAL HIGH (ref 6–20)
CALCIUM: 9.5 mg/dL (ref 8.9–10.3)
CO2: 40 mmol/L — AB (ref 22–32)
CREATININE: 1 mg/dL (ref 0.44–1.00)
Chloride: 94 mmol/L — ABNORMAL LOW (ref 101–111)
GFR, EST NON AFRICAN AMERICAN: 57 mL/min — AB (ref >60)
Glucose, Bld: 127 mg/dL — ABNORMAL HIGH (ref 65–99)
Potassium: 3.3 mmol/L — ABNORMAL LOW (ref 3.5–5.1)
Sodium: 143 mmol/L (ref 135–145)

## 2016-12-31 LAB — GLUCOSE, CAPILLARY
GLUCOSE-CAPILLARY: 273 mg/dL — AB (ref 65–99)
GLUCOSE-CAPILLARY: 292 mg/dL — AB (ref 65–99)
Glucose-Capillary: 123 mg/dL — ABNORMAL HIGH (ref 65–99)
Glucose-Capillary: 193 mg/dL — ABNORMAL HIGH (ref 65–99)

## 2016-12-31 MED ORDER — MAGNESIUM SULFATE 2 GM/50ML IV SOLN
2.0000 g | Freq: Once | INTRAVENOUS | Status: AC
  Administered 2016-12-31: 2 g via INTRAVENOUS
  Filled 2016-12-31: qty 50

## 2016-12-31 MED ORDER — FUROSEMIDE 10 MG/ML IJ SOLN
40.0000 mg | Freq: Two times a day (BID) | INTRAMUSCULAR | Status: DC
  Administered 2016-12-31 – 2017-01-02 (×4): 40 mg via INTRAVENOUS
  Filled 2016-12-31 (×4): qty 4

## 2016-12-31 MED ORDER — POTASSIUM CHLORIDE CRYS ER 20 MEQ PO TBCR
40.0000 meq | EXTENDED_RELEASE_TABLET | ORAL | Status: AC
  Administered 2016-12-31 (×2): 40 meq via ORAL
  Filled 2016-12-31 (×2): qty 2

## 2016-12-31 MED ORDER — FUROSEMIDE 10 MG/ML IJ SOLN
60.0000 mg | Freq: Once | INTRAMUSCULAR | Status: AC
  Administered 2016-12-31: 60 mg via INTRAVENOUS
  Filled 2016-12-31: qty 6

## 2016-12-31 MED ORDER — METHYLPREDNISOLONE SODIUM SUCC 125 MG IJ SOLR
125.0000 mg | INTRAMUSCULAR | Status: AC
  Administered 2016-12-31: 125 mg via INTRAVENOUS
  Filled 2016-12-31: qty 2

## 2016-12-31 MED ORDER — METHYLPREDNISOLONE SODIUM SUCC 125 MG IJ SOLR
60.0000 mg | Freq: Two times a day (BID) | INTRAMUSCULAR | Status: DC
  Administered 2016-12-31 – 2017-01-01 (×3): 60 mg via INTRAVENOUS
  Filled 2016-12-31 (×4): qty 2

## 2016-12-31 MED ORDER — ACETAZOLAMIDE 250 MG PO TABS
500.0000 mg | ORAL_TABLET | Freq: Once | ORAL | Status: AC
  Administered 2016-12-31: 500 mg via ORAL
  Filled 2016-12-31: qty 2

## 2016-12-31 NOTE — Progress Notes (Signed)
Labored breathing, rate 36.  O2 increased to 5L [place] to get O2 sats above 90%.  Dr. Elpidio AnisSudini notified and has seen the patient.

## 2016-12-31 NOTE — Progress Notes (Signed)
Respiratory rate remains at 36, however it is not labored.  She is able to talk more evenly with having to stop to breath.  She says she feels much better than she did this morning.

## 2016-12-31 NOTE — NC FL2 (Signed)
[place] MEDICAID FL2 LEVEL OF CARE SCREENING TOOL     IDENTIFICATION  Patient Name: Jasmine Osborn Birthdate: [birth year] Sex: female Admission Date (Current Location): 12/22/2016  [facility] and [place] Number:  Chiropodist and Address:  [facility], [address], [place], [place] [address]      Provider Number: [id number]  Attending Physician Name and Address:  Milagros Loll, MD  Relative Name and Phone Number:  [name], [name] [phone]  [phone] or [name], [name] Daughter   [phone]     Current Level of Care: Hospital Recommended Level of Care: Skilled Nursing Facility Prior Approval Number:    Date Approved/Denied:   PASRR Number: Pending  Discharge Plan: SNF    Current Diagnoses: Patient Active Problem List   Diagnosis Date Noted  . Pressure injury of skin 12/23/2016  . Respiratory failure with hypercapnia (HCC) 12/22/2016    Orientation RESPIRATION BLADDER Height & Weight     Self, Place  O2 (5 L) Incontinent Weight: 256 lb 6.4 oz (116.3 kg) Height:  5' 4.02" (162.6 cm)  BEHAVIORAL SYMPTOMS/MOOD NEUROLOGICAL BOWEL NUTRITION STATUS      Incontinent Diet (Carb Modified)  AMBULATORY STATUS COMMUNICATION OF NEEDS Skin   Limited Assist Verbally PU Stage and Appropriate Care     PU Stage 3 Dressing: Daily                 Personal Care Assistance Level of Assistance  Feeding, Dressing, Bathing Bathing Assistance: Limited assistance Feeding assistance: Limited assistance Dressing Assistance: Limited assistance     Functional Limitations Info  Sight, Hearing, Speech Sight Info: Adequate Hearing Info: Adequate Speech Info: Adequate    SPECIAL CARE FACTORS FREQUENCY  PT (By licensed PT)     PT Frequency: 5x a week              Contractures Contractures Info: Not present    Additional Factors Info  Code Status, Allergies, Insulin Sliding Scale, Psychotropic Code Status Info: Full  Code Allergies Info: NKA Psychotropic Info: risperiDONE (RISPERDAL) tablet 1 mg Insulin Sliding Scale Info: insulin aspart (novoLOG) injection 0-20 Units 3x a day with meals       Current Medications (12/31/2016):  This is the current hospital active medication list Current Facility-Administered Medications  Medication Dose Route Frequency Provider Last Rate Last Dose  . acetaZOLAMIDE (DIAMOX) tablet 500 mg  500 mg Oral Once Srikar Sudini, MD      . aspirin chewable tablet 81 mg  81 mg Oral Daily Lewie Loron, NP   81 mg at 12/31/16 0921  . bisacodyl (DULCOLAX) suppository 10 mg  10 mg Rectal Daily PRN Lewie Loron, NP      . budesonide (PULMICORT) nebulizer solution 0.5 mg  0.5 mg Nebulization BID Erin Fulling, MD   0.5 mg at 12/31/16 0734  . ceFAZolin (ANCEF) IVPB 1 g/50 mL premix  1 g Intravenous Q12H Srikar Sudini, MD   1 g at 12/31/16 1059  . chlorhexidine (PERIDEX) 0.12 % solution 15 mL  15 mL Mouth Rinse BID Lewie Loron, NP   15 mL at 12/31/16 0922  . docusate sodium (COLACE) capsule 100 mg  100 mg Oral BID Gwendolyn Fill, NP   100 mg at 12/31/16 4132  . enoxaparin (LOVENOX) injection 40 mg  40 mg Subcutaneous Q12H Shane Crutch, MD   40 mg at 12/31/16 0923  . famotidine (PEPCID) tablet 20 mg  20 mg Oral Daily Bincy Baldemar Friday, NP  20 mg at 12/31/16 [id number]  . insulin aspart (novoLOG) injection 0-20 Units  0-20 Units Subcutaneous TID WC & HS Milagros LollSrikar Sudini, MD   3 Units at 12/31/16 0813  . ipratropium-albuterol (DUONEB) 0.5-2.5 (3) MG/3ML nebulizer solution 3 mL  3 mL Nebulization Q4H Erin FullingKurian Kasa, MD   3 mL at 12/31/16 1111  . latanoprost (XALATAN) 0.005 % ophthalmic solution 1 drop  1 drop Both Eyes QHS Shane CrutchPradeep Ramachandran, MD   1 drop at 12/30/16 2149  . megestrol (MEGACE) tablet 80 mg  80 mg Oral BID Lewie LoronMagadalene S Tukov, NP   80 mg at 12/31/16 [id number]  . metoprolol (LOPRESSOR) tablet 50 mg  50 mg Oral BID Milagros LollSrikar Sudini, MD   50 mg at 12/31/16 [id number]  . nutrition  supplement (JUVEN) (JUVEN) powder packet 1 packet  1 packet Oral BID BM Milagros LollSrikar Sudini, MD   1 packet at 12/31/16 1000  . risperiDONE (RISPERDAL) tablet 1 mg  1 mg Oral QHS Shane CrutchPradeep Ramachandran, MD   1 mg at 12/30/16 2137  . senna-docusate (Senokot-S) tablet 1 tablet  1 tablet Oral BID Gwendolyn FillBincy S Varughese, NP   1 tablet at 12/31/16 [id number]  . sodium chloride flush (NS) 0.9 % injection 10-40 mL  10-40 mL Intracatheter Q12H Shane CrutchPradeep Ramachandran, MD   30 mL at 12/31/16 0923  . sodium chloride flush (NS) 0.9 % injection 10-40 mL  10-40 mL Intracatheter PRN Shane CrutchPradeep Ramachandran, MD      . torsemide (DEMADEX) tablet 20 mg  20 mg Oral BID Milagros LollSrikar Sudini, MD   20 mg at 12/31/16 [id number]     Discharge Medications: Please see discharge summary for a list of discharge medications.  Relevant Imaging Results:  Relevant Lab Results:   Additional Information SSN [id number]  Darleene Cleavernterhaus, Arpi Diebold R, [place]WA

## 2016-12-31 NOTE — Progress Notes (Signed)
[facility] [place] [facility]   PATIENT NAME: Jasmine PetrinDarlene Vandagriff    MR#:  [id number]  DATE OF BIRTH:  01/18/1951   Admitted to intensive care unit for acute hypercapnic respiratory failure requiring intubation, also had septic shock. Extubated, transferred to telemetry. Patient has klebsiella UTI.   CHIEF COMPLAINT:   Chief Complaint  Patient presents with  . Weakness   More SOB. RR 36 and sats 90% on 6 L O2. Worsening  REVIEW OF SYSTEMS:   ROS CONSTITUTIONAL: fatigue EYES: No blurred or double vision.  EARS, NOSE, AND THROAT: No tinnitus or ear pain.  RESPIRATORY: SOB CARDIOVASCULAR: No chest pain, orthopnea, edema.  GASTROINTESTINAL: No nausea, vomiting, diarrhea or abdominal pain.  GENITOURINARY: No dysuria, hematuria.  ENDOCRINE: No polyuria, nocturia,  HEMATOLOGY: No anemia, easy bruising or bleeding SKIN: No rash or lesion. MUSCULOSKELETAL: Edema of the legs. NEUROLOGIC: No tingling, numbness, weakness.  PSYCHIATRY: No anxiety or depression.   DRUG ALLERGIES:  No Known Allergies  VITALS:  Blood pressure 133/79, pulse (!) 103, temperature 98.2 F (36.8 C), temperature source Oral, resp. rate 18, height 5' 4.02" (1.626 m), weight 116.3 kg (256 lb 6.4 oz), SpO2 91 %.  PHYSICAL EXAMINATION:  GENERAL:  66 y.o.-year-old patient lying in the bed with no acute distress.morbidly obese female in resp distress EYES: Pupils equal, round, reactive to light . No scleral icterus. Extraocular muscles intact.  HEENT: Head atraumatic, normocephalic. Oropharynx and nasopharynx clear.  NECK:  Supple, no jugular venous distention. No thyroid enlargement, no tenderness.  LUNGS: Bilateral wheezing. Increased work of breathing. Crackles. CARDIOVASCULAR: S1, S2 normal. No murmurs, rubs, or gallops.  ABDOMEN: Soft, nontender, nondistended. Bowel sounds present. No organomegaly or mass.  EXTREMITIES: No pedal edema, cyanosis, or clubbing.  NEUROLOGIC: Cranial  nerves II through XII are intact. Muscle strength 5/5 in all extremities. Sensation intact. Gait not checked.  PSYCHIATRIC: The patient is alert and awake SKIN: No obvious rash, lesion, or ulcer.   LABORATORY PANEL:   CBC  Recent Labs Lab 12/29/16 0637  WBC 4.2  HGB 11.8*  HCT 37.4  PLT 164   ------------------------------------------------------------------------------------------------------------------  Chemistries   Recent Labs Lab 12/29/16 0637 12/31/16 0630  NA 139 143  K 3.6 3.3*  CL 96* 94*  CO2 36* 40*  GLUCOSE 101* 127*  BUN 22* 22*  CREATININE 0.91 1.00  CALCIUM 8.9 9.5  MG 1.5*  --    ------------------------------------------------------------------------------------------------------------------  Cardiac Enzymes No results for input(s): TROPONINI in the last 168 hours. ------------------------------------------------------------------------------------------------------------------  RADIOLOGY:  Dg Chest Port 1 View  Result Date: 12/31/2016 CLINICAL DATA:  66 year old diabetic hypertensive female with hypoxia. Subsequent encounter. EXAM: PORTABLE CHEST 1 VIEW COMPARISON:  12/27/2016. FINDINGS: Cardiomegaly. Pulmonary vascular congestion most notable centrally. Improved aeration lung bases. Limited evaluation of retrocardiac region. Left central line tip proximal superior vena cava level. No gross pneumothorax. Endotracheal tube and nasogastric tube removed. IMPRESSION: Endotracheal tube and nasogastric tube removed. Improved aeration lung bases. Limited evaluation retrocardiac region. Cardiomegaly with pulmonary vascular congestion most notable centrally. Electronically Signed   By: Lacy DuverneySteven  Olson M.D.   On: 12/31/2016 08:52    EKG:   Orders placed or performed during the hospital encounter of 12/22/16  . ED EKG within 10 minutes  . ED EKG within 10 minutes  . EKG 12-Lead  . EKG 12-Lead    ASSESSMENT AND PLAN:  * Acute hypoxic respiratory failure -  worsening STAT CXR showed CHF. Gave her solumedrol, IV lasix and nebs.  Slow improvement. Discussed with Dr. Sung Amabile. Will add one dose Diamox. He will see patient. May need stepdown if no improvement. Will wait for pulm input  * COPD exacerbation Back on IV steroids now  # Septic shock: Secondary to klebsiella UTI.  Shock has resolved On IV abx  # Elevated troponin due to demand ischemia. Echocardiogram showed EF more than 60% has pulmonary hypertension.  # Acute on chronic diastolic heart failure IV lasix  # Acute renal failure secondary to septic shock: Kidney function improved with hydration, treating the sepsis, patient having good urine output. Patient home medicine metformin is still on hold. Resume at discharge.  # Metabolic encephalopathy: Improving, continue home dose Risperdal.  # diabetes mellitus type 2 SSI  # Morbid obesity possible sleep apnea  # deconditioning SNF at discharge   All the records are reviewed and case discussed with Care Management/Social Workerr. Management plans discussed with the patient, family and they are in agreement.  CODE STATUS: full  TOTAL  CC TIME TAKING CARE OF THIS PATIENT: 35 minutes.   POSSIBLE D/C IN 2-3 DAYS, DEPENDING ON CLINICAL CONDITION.  Milagros Loll R M.D on 12/31/2016 at 12:31 PM  Between 7am to 6pm - Pager - [phone]  After 6pm go to [email or web address] - password [email or web address]  [facility]  Office  [phone]  CC: Primary care physician; Lauro Regulus., MD   Note: This dictation was prepared with Dragon dictation along with smaller phrase technology. Any transcriptional errors that result from this process are unintentional.

## 2016-12-31 NOTE — Progress Notes (Signed)
[facility] Progess Note    ASSESSMENT/PLAN   66 yo female with Perineal cellulitis/UTI s/p severe septic shock, now with volume overload and acute on hypercapnic respiratory failure.   --Continue bipap qhs and for daytime naps or respiratory difficulty.  --Continue nebs, which seem to benefit her breathing.   ---------------------------------------   ----------------------------------------   Name: Jasmine DustmanDarlene O Osborn MRN: [id number] DOB: 06/06/1951    ADMISSION DATE:  12/22/2016    SUBJECTIVE:  Lethargic awake, no new complaints. States that she feels better after receiving neb.   Review of Systems:  Limited due to lethargy Denies chest pain No NVD   VITAL SIGNS: Temp:  [97.4 F (36.3 C)-98.2 F (36.8 C)] 98.2 F (36.8 C) (03/27 1134) Pulse Rate:  [99-106] 103 (03/27 1134) Resp:  [18-36] 18 (03/27 1134) BP: (128-139)/(51-79) 133/79 (03/27 1134) SpO2:  [86 %-98 %] 91 % (03/27 1134) FiO2 (%):  [40 %] 40 % (03/27 0734) Weight:  [256 lb 6.4 oz (116.3 kg)] 256 lb 6.4 oz (116.3 kg) (03/27 0432) HEMODYNAMICS:   VENTILATOR SETTINGS: FiO2 (%):  [40 %] 40 % INTAKE / OUTPUT:  Intake/Output Summary (Last 24 hours) at 12/31/16 1527 Last data filed at 12/31/16 1422  Gross per 24 hour  Intake              700 ml  Output             1700 ml  Net            -1000 ml    PHYSICAL EXAMINATION: Physical Examination:   VS: BP 133/79 (BP Location: Right Arm)   Pulse (!) 103   Temp 98.2 F (36.8 C) (Oral)   Resp 18   Ht 5' 4.02" (1.626 m)   Wt 256 lb 6.4 oz (116.3 kg)   SpO2 91%   BMI 43.99 kg/m   General Appearance: No distress  Neuro:without focal findings, mental status awake, alert, HEENT: PERRLA, EOM intact. Pulmonary: Scattered rhonchi again bilaterally, decreased air entry bilaterally.  CardiovascularNormal S1,S2.  No m/r/g.   Abdomen: Benign, Soft, non-tender. Renal:  No costovertebral tenderness  GU:  Not performed at this  time. Endocrine: No evident thyromegaly. Skin:   warm, no rashes, no ecchymosis  Extremities: normal,2+ bilat edema.    LABS:   LABORATORY PANEL:   CBC  Recent Labs Lab 12/29/16 0637  WBC 4.2  HGB 11.8*  HCT 37.4  PLT 164    Chemistries   Recent Labs Lab 12/29/16 0637 12/31/16 0630  NA 139 143  K 3.6 3.3*  CL 96* 94*  CO2 36* 40*  GLUCOSE 101* 127*  BUN 22* 22*  CREATININE 0.91 1.00  CALCIUM 8.9 9.5  MG 1.5*  --   PHOS 3.3  --      Recent Labs Lab 12/30/16 0755 12/30/16 1147 12/30/16 1647 12/30/16 2114 12/31/16 0736 12/31/16 1136  GLUCAP 99 170* 158* 171* 123* 193*    Recent Labs Lab 12/26/16 0510 12/27/16 0527 12/29/16 0500  PHART 7.41 7.41 7.34*  PCO2ART 49* 59* 74*  PO2ART 73* 96 77*   No results for input(s): AST, ALT, ALKPHOS, BILITOT, ALBUMIN in the last 168 hours.  Cardiac Enzymes No results for input(s): TROPONINI in the last 168 hours.  RADIOLOGY:  Dg Chest Port 1 View  Result Date: 12/31/2016 CLINICAL DATA:  66 year old diabetic hypertensive female with hypoxia. Subsequent encounter. EXAM: PORTABLE CHEST 1 VIEW COMPARISON:  12/27/2016. FINDINGS: Cardiomegaly. Pulmonary vascular congestion most notable centrally.  Improved aeration lung bases. Limited evaluation of retrocardiac region. Left central line tip proximal superior vena cava level. No gross pneumothorax. Endotracheal tube and nasogastric tube removed. IMPRESSION: Endotracheal tube and nasogastric tube removed. Improved aeration lung bases. Limited evaluation retrocardiac region. Cardiomegaly with pulmonary vascular congestion most notable centrally. Electronically Signed   By: Lacy Duverney M.D.   On: 12/31/2016 08:52     Deep Nicholos Johns, M.D.  [facility]

## 2016-12-31 NOTE — Progress Notes (Signed)
qPhysical Therapy Treatment Patient Details Name: Jasmine Osborn MRN: [id number] DOB: May 15, 1951 Today's Date: 12/31/2016    History of Present Illness Patient is a 66 y.o. female admitted on 18 March for respiratory failure w/hypercapnia. Was intubated and extubated on 23 March. Patient had a UTI.    PT Comments    Pt agreeable to PT and wishes up in the chair. Denies pain initially, but with stand/ambulation, pt notes pain in left foot due to sore. Pt improved bed mobility, as she did not require physical assist. Task is effortful requiring increased time and an increase in heart rate. Pt requires Min A with a little greater assist needed at times due to mild loss of balance with weight bearing on the left due to pain. Pt would benefit from an off loading shoe (ulcer on left heel). Pt received up in chair comfortably. Continue PT to progress strength and endurance to improve functional mobility.    Follow Up Recommendations  SNF     Equipment Recommendations  None recommended by PT    Recommendations for Other Services       Precautions / Restrictions Restrictions Weight Bearing Restrictions: No    Mobility  Bed Mobility Overal bed mobility: Modified Independent Bed Mobility: Supine to Sit     Supine to sit: Modified independent (Device/Increase time)     General bed mobility comments: Increased time/effort; use of rails/edge of bed. Requires rest before scooting to edge of bed. HR increases to 115 bpm. couple min rest to return to 104  Transfers Overall transfer level: Needs assistance Equipment used: Rolling walker (2 wheeled) Transfers: Sit to/from Stand Sit to Stand: Min assist         General transfer comment: Mild LOB in stand due to sore on L heel  Ambulation/Gait Ambulation/Gait assistance: Min assist Ambulation Distance (Feet): 2 Feet Assistive device: Rolling walker (2 wheeled) Gait Pattern/deviations: Step-to pattern Gait velocity: slow Gait  velocity interpretation: <1.8 ft/sec, indicative of risk for recurrent falls General Gait Details: several small steps bed to chair with chair up against bed. Mild LOB due to sore on L foot   Stairs            Wheelchair Mobility    Modified Rankin (Stroke Patients Only)       Balance Overall balance assessment: Needs assistance Sitting-balance support: Bilateral upper extremity supported;Feet supported Sitting balance-Leahy Scale: Fair     Standing balance support: Bilateral upper extremity supported Standing balance-Leahy Scale: Fair (Fair -)                              Cognition Arousal/Alertness: Awake/alert Behavior During Therapy: WFL for tasks assessed/performed Overall Cognitive Status: Within Functional Limits for tasks assessed                                        Exercises      General Comments General comments (skin integrity, edema, etc.): discolored L distal LE      Pertinent Vitals/Pain Pain Assessment: No/denies pain    Home Living                      Prior Function            PT Goals (current goals can now be found in the care plan section) Progress towards PT goals:  Progressing toward goals    Frequency    Min 2X/week      PT Plan Current plan remains appropriate    Co-evaluation             End of Session Equipment Utilized During Treatment: Oxygen Activity Tolerance: Patient limited by fatigue;Other (comment) (increase in HR) Patient left: in chair;with call bell/phone within reach;with chair alarm set;with family/visitor present   PT Visit Diagnosis: Muscle weakness (generalized) (M62.81);Pain;Difficulty in walking, not elsewhere classified (R26.2) Pain - Right/Left: Left Pain - part of body:  (sore on foot)     Time: [id number] PT Time Calculation (min) (ACUTE ONLY): 24 min  Charges:  $Therapeutic Activity: 23-37 mins                    G CodesScot Dock:        Yobani Schertzer E  [place]ally Callas, PTA 12/31/2016, 4:04 PM

## 2016-12-31 NOTE — Clinical Social Work Note (Signed)
CSW spoke to patient's husband Bonna GainsRance Garringer [phone] regarding going to SNF and what the process is for looking for bed placements.  CSW was given permission by husband [place].  Patient's husband states that she has been in rehab in the past, but could not remember how many years ago it was.  CSW explained how insurance will pay for stay and what to expect at SNF.  Patient's husband did not express any other questions or concerns at this time.  CSW to begin bed search process.  Ervin KnackEric R. Nelsie Domino, MSW, Theresia MajorsLCSWA [phone]  12/31/2016 12:13 PM

## 2016-12-31 NOTE — Plan of Care (Signed)
Problem: Safety: Goal: Ability to remain free from injury will improve Outcome: Not Progressing He is still not oriented to place and situation.  Even when I explain where he is he doesn't acknowledge what I said.  He continues to try to get out of bed.  Became combative today and required medication

## 2017-01-01 DIAGNOSIS — E873 Alkalosis: Secondary | ICD-10-CM

## 2017-01-01 LAB — BASIC METABOLIC PANEL
Anion gap: 8 (ref 5–15)
BUN: 40 mg/dL — AB (ref 6–20)
CHLORIDE: 94 mmol/L — AB (ref 101–111)
CO2: 37 mmol/L — ABNORMAL HIGH (ref 22–32)
CREATININE: 1.34 mg/dL — AB (ref 0.44–1.00)
Calcium: 9.6 mg/dL (ref 8.9–10.3)
GFR, EST AFRICAN AMERICAN: 47 mL/min — AB (ref >60)
GFR, EST NON AFRICAN AMERICAN: 40 mL/min — AB (ref >60)
Glucose, Bld: 261 mg/dL — ABNORMAL HIGH (ref 65–99)
POTASSIUM: 3.9 mmol/L (ref 3.5–5.1)
SODIUM: 139 mmol/L (ref 135–145)

## 2017-01-01 LAB — GLUCOSE, CAPILLARY
Glucose-Capillary: 262 mg/dL — ABNORMAL HIGH (ref 65–99)
Glucose-Capillary: 346 mg/dL — ABNORMAL HIGH (ref 65–99)
Glucose-Capillary: 271 mg/dL — ABNORMAL HIGH (ref 65–99)

## 2017-01-01 LAB — MAGNESIUM: MAGNESIUM: 2 mg/dL (ref 1.7–2.4)

## 2017-01-01 MED ORDER — INSULIN ASPART 100 UNIT/ML ~~LOC~~ SOLN
3.0000 [IU] | Freq: Three times a day (TID) | SUBCUTANEOUS | Status: DC
  Administered 2017-01-01 – 2017-01-03 (×6): 3 [IU] via SUBCUTANEOUS
  Filled 2017-01-01 (×6): qty 3

## 2017-01-01 MED ORDER — IPRATROPIUM-ALBUTEROL 0.5-2.5 (3) MG/3ML IN SOLN
3.0000 mL | Freq: Four times a day (QID) | RESPIRATORY_TRACT | Status: DC
  Administered 2017-01-01 – 2017-01-03 (×5): 3 mL via RESPIRATORY_TRACT
  Filled 2017-01-01 (×6): qty 3

## 2017-01-01 MED ORDER — INSULIN GLARGINE 100 UNIT/ML ~~LOC~~ SOLN
12.0000 [IU] | Freq: Every day | SUBCUTANEOUS | Status: DC
  Administered 2017-01-01 – 2017-01-02 (×2): 12 [IU] via SUBCUTANEOUS
  Filled 2017-01-01 (×3): qty 0.12

## 2017-01-01 NOTE — Progress Notes (Signed)
Inpatient Diabetes Program Recommendations  AACE/ADA: New Consensus Statement on Inpatient Glycemic Control (2015)  Target Ranges:  Prepandial:   less than 140 mg/dL      [facility] postprandial:   less than 180 mg/dL (1-2 hours)      Critically ill patients:  140 - 180 mg/dL   Results for Jasmine Osborn, Jasmine Osborn (MRN [id number]) as of 01/01/2017 11:58  Ref. Range 12/31/2016 07:36 12/31/2016 11:36 12/31/2016 16:12 12/31/2016 20:58 01/01/2017 07:43 01/01/2017 11:53  Glucose-Capillary Latest Ref Range: 65 - 99 mg/dL 956123 (H) 213193 (H) 086273 (H) 292 (H) 262 (H) 346 (H)   Review of Glycemic Control  Diabetes history: DM2 Outpatient Diabetes medications: Metformin 500 mg BID, Amaryl 2 mg QAM Current orders for Inpatient glycemic control: Novolog 0-20 units ACHS  Inpatient Diabetes Program Recommendations: Insulin - Basal: Glucose has increased significantly since starting steroids. If steroids are continued as ordered (Solumedrol 60 mg Q12H), please consider ordering Lantus 10 units Q24H starting now. Please keep in mind that if Lantus is ordered as recommended, it will likely need to be adjusted as steroids are tapered. Insulin - Meal Coverage: If steroids are continued as ordered, please consider ordering Novolog 4 units TID with meals for meal coverage if patient eats at least 50% of meals.  Thanks, Orlando PennerMarie Ronne Stefanski, RN, MSN, CDE Diabetes Coordinator Inpatient Diabetes Program [phone] (Team Pager from 8am to 5pm)

## 2017-01-01 NOTE — Progress Notes (Signed)
[facility] - [place] at [facility]   PATIENT NAME: Jasmine Osborn    MR#:  [id number]  DATE OF BIRTH:  Sep 01, 1951  SUBJECTIVE:  CHIEF COMPLAINT:   Chief Complaint  Patient presents with  . Weakness   - Has some dyspnea. Denies any chest pain. Unable to tolerate BiPAP. -On IV steroids and IV Lasix. Requiring 4 L oxygen. Not on home oxygen  REVIEW OF SYSTEMS:  Review of Systems  Constitutional: Positive for malaise/fatigue. Negative for chills and fever.  HENT: Negative for congestion, ear discharge, hearing loss and nosebleeds.   Eyes: Negative for blurred vision and double vision.  Respiratory: Positive for shortness of breath. Negative for cough and wheezing.   Cardiovascular: Positive for leg swelling. Negative for chest pain and palpitations.  Gastrointestinal: Positive for abdominal pain. Negative for constipation, diarrhea, nausea and vomiting.  Genitourinary: Negative for dysuria.  Musculoskeletal: Negative for myalgias.  Neurological: Negative for dizziness, speech change, focal weakness, seizures and headaches.  Psychiatric/Behavioral: Negative for depression.    DRUG ALLERGIES:  No Known Allergies  VITALS:  Blood pressure 125/68, pulse (!) 109, temperature 98 F (36.7 C), temperature source Oral, resp. rate 20, height 5' 4.02" (1.626 m), weight 113.7 kg (250 lb 11.2 oz), SpO2 95 %.  PHYSICAL EXAMINATION:  Physical Exam  GENERAL:  66 y.o.-year-old Obese patient sitting in the bed with no acute distress.  EYES: Pupils equal, round, reactive to light and accommodation. No scleral icterus. Extraocular muscles intact.  HEENT: Head atraumatic, normocephalic. Oropharynx and nasopharynx clear.  NECK:  Supple, no jugular venous distention. No thyroid enlargement, no tenderness.  LUNGS: Normal breath sounds bilaterally, no wheezing, rales,rhonchi or crepitation. No use of accessory muscles of respiration.  CARDIOVASCULAR: S1, S2 normal. No murmurs,  rubs, or gallops.  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 Bowel sounds present. No organomegaly or mass.  EXTREMITIES: No cyanosis, or clubbing. 1+ pedal edema present NEUROLOGIC: Cranial nerves II through XII are intact. Muscle strength 5/5 in all extremities. Sensation intact. Gait not checked. Global weakness noted. PSYCHIATRIC: The patient is alert and oriented x 3. No confusion noted now SKIN: No obvious rash, lesion, or ulcer.    LABORATORY PANEL:   CBC  Recent Labs Lab 12/29/16 0637  WBC 4.2  HGB 11.8*  HCT 37.4  PLT 164   ------------------------------------------------------------------------------------------------------------------  Chemistries   Recent Labs Lab 01/01/17 0457  NA 139  K 3.9  CL 94*  CO2 37*  GLUCOSE 261*  BUN 40*  CREATININE 1.34*  CALCIUM 9.6  MG 2.0   ------------------------------------------------------------------------------------------------------------------  Cardiac Enzymes No results for input(s): TROPONINI in the last 168 hours. ------------------------------------------------------------------------------------------------------------------  RADIOLOGY:  Dg Chest Port 1 View  Result Date: 12/31/2016 CLINICAL DATA:  66 year old diabetic hypertensive female with hypoxia. Subsequent encounter. EXAM: PORTABLE CHEST 1 VIEW COMPARISON:  12/27/2016. FINDINGS: Cardiomegaly. Pulmonary vascular congestion most notable centrally. Improved aeration lung bases. Limited evaluation of retrocardiac region. Left central line tip proximal superior vena cava level. No gross pneumothorax. Endotracheal tube and nasogastric tube removed. IMPRESSION: Endotracheal tube and nasogastric tube removed. Improved aeration lung bases. Limited evaluation retrocardiac region. Cardiomegaly with pulmonary vascular congestion most notable centrally. Electronically Signed   By: Lacy Duverney M.D.   On: 12/31/2016 08:52    EKG:   Orders placed or performed during the hospital encounter of 12/22/16  . ED EKG within 10 minutes  . ED EKG within 10 minutes  .  EKG 12-Lead  . EKG 12-Lead    ASSESSMENT AND PLAN:   66 year old female with past medical history significant for depression, history of psychosis, schizophrenia, diabetes, obesity and peripheral vascular disease admitted to ICU for acute respiratory failure.  #1 acute hypoxic respiratory failure-secondary to COPD exacerbation and diastolic CHF exacerbation. -Patient was in ICU, intubated, and then extubated on 12/27/2016. -Remains on 4 L oxygen at this time. Not on home oxygen. Patient is a mouth breather and is also very obese. -Has underlying sleep apnea or obesity hypoventilation syndrome. Denies any prior sleep study are being on CPAP. -Unable to tolerate BiPAP here. Continue oxygen support as tolerated. -Continue IV steroids twice a day today. Also on IV Lasix for today. -Appreciate pulmonary consult. -Incentive spirometry encouraged. Continue inhalers and nebs as needed.  #2 diabetes mellitus-will start low-dose Lantus as sugars are elevated from being on steroids. Continue sliding scale insulin.  #3 diastolic CHF-acute on chronic, EF greater than 60% but has pulmonary hypertension. Management as mentioned above.  #4 septic shock-secondary to Klebsiella UTI. Septic shock has resolved. Continue IV Ancef at this time.  #5 depression and anxiety-on risperidone due to history of psychosis. We'll hold off on Remeron for today but restarted prior to discharge.  #6 DVT prophylaxis-continue Lovenox  #7 hypertension-due to low normal blood pressure, continue to hold Norvasc, losartan, metoprolol   Physical therapy recommended rehabilitation.     All the records are reviewed and case discussed with Care Management/Social Workerr. Management plans discussed with the patient, family and they are in  agreement.  CODE STATUS: Full Code  TOTAL TIME TAKING CARE OF THIS PATIENT: 37 minutes.   POSSIBLE D/C IN 2 DAYS, DEPENDING ON CLINICAL CONDITION.   Enid BaasKALISETTI,Naszir Cott M.D on 01/01/2017 at 3:15 PM  Between 7am to 6pm - Pager - [phone]  After 6pm go to [email or web address] - password [email or web address]  [facility]  Office  [phone]  CC: Primary care physician; [name],MARSHALL W., MD

## 2017-01-01 NOTE — Clinical Social Work Note (Signed)
CSW presented bed offers to patient and her husband they chose [facility] .  CSW contacted [facility], and they will begin insurance authorization.  CSW is waiting for [facility] number, CSW faxed requested clincal information to [facility],  CSW to continue to follow patient's progress throughout discharge planning.  Jasmine KnackEric R. Tawanda Schall, MSW, Theresia MajorsLCSWA [phone]  01/01/2017 5:05 PM

## 2017-01-01 NOTE — Progress Notes (Signed)
Name: Jasmine DustmanDarlene O Jahnke MRN: [id number] DOB: 03/06/1951    ADMISSION DATE:  12/22/2016 CONSULTATION DATE:  12/22/2016  REFERRING MD :  Dr. York CeriseForbach   CHIEF COMPLAINT: Lethargy and Shortness of Breath  BRIEF PATIENT DESCRIPTION:  66 yo female admitted with Klebsiella UTI, severe septic shock, and OSA/OHS.  Pulmonary edema with volume overload mechanically intubated on admission and extubated 03/23.  SIGNIFICANT EVENTS  03/18-Pt admitted to ICU mechanically intubated PCCM contacted for management  03/23-Pt extubated  03/25-Pt transferred out of ICU to telemetry unit  STUDIES:  CT Head 03/18>>No acute intracranial findings. Maxillary and ethmoid sinus inflammation. Renal US 03/19>>Increased cortical echogenicity bilaterally.  No renal obstruction Cholelithiasis Echo 03/19>>EF 60% to 65%  REVIEW OF SYSTEMS:  Positives in BOLD Constitutional: Negative for fever, chills, weight loss, malaise/fatigue and diaphoresis.  HENT: Negative for hearing loss, ear pain, nosebleeds, congestion, sore throat, neck pain, tinnitus and ear discharge.   Eyes: Negative for blurred vision, double vision, photophobia, pain, discharge and redness.  Respiratory: nonproductive infrequent cough, hemoptysis, sputum production, shortness of breath, wheezing and stridor.   Cardiovascular: chest pain, palpitations, orthopnea, claudication, leg swelling and PND.  Gastrointestinal: Negative for heartburn, nausea, vomiting, abdominal pain, diarrhea, constipation, blood in stool and melena.  Genitourinary: Negative for dysuria, urgency, frequency, hematuria and flank pain.  Musculoskeletal: Negative for myalgias, back pain, joint pain and falls.  Skin: Negative for itching and rash.  Neurological: Negative for dizziness, tingling, tremors, sensory change, speech change, focal weakness, seizures, loss of consciousness, weakness and headaches.  Endo/Heme/Allergies: Negative for environmental allergies and polydipsia.  Does not bruise/bleed easily.  SUBJECTIVE:  Pt states she does feel better overall with periods of shortness of breath.  According to RN pt refused Bipap last night, however pt states she does not remember refusing.  She is currently on 4L O2 via nasal canula.  VITAL SIGNS: Temp:  [98 F (36.7 C)-99 F (37.2 C)] 98 F (36.7 C) (03/28 1119) Pulse Rate:  [103-114] 109 (03/28 1119) Resp:  [18-36] 20 (03/28 1119) BP: (122-148)/(68-93) 125/68 (03/28 1119) SpO2:  [91 %-96 %] 95 % (03/28 1119) Weight:  [113.7 kg (250 lb 11.2 oz)] 113.7 kg (250 lb 11.2 oz) (03/28 0420)  PHYSICAL EXAMINATION: General: obese Caucasian female, NAD sitting in chair Neuro: alert and oriented, follows commands, PERRLA HEENT: supple, no JVD Cardiovascular: sinus tach, s1s2, no M/R/G Lungs: faint crackles bilateral bases, even, non labored  Abdomen: +BS x4, obese, large ventral hernia that is reducible, soft, non tender, non distended  Musculoskeletal: 2+ bilateral lower extremity edema Skin:  Dressing dry and intact left lower extremity, redness of left lower extremity, scattered ecchymosis    Recent Labs Lab 12/29/16 0637 12/31/16 0630 01/01/17 0457  NA 139 143 139  K 3.6 3.3* 3.9  CL 96* 94* 94*  CO2 36* 40* 37*  BUN 22* 22* 40*  CREATININE 0.91 1.00 1.34*  GLUCOSE 101* 127* 261*    Recent Labs Lab 12/27/16 0425 12/28/16 0356 12/29/16 0637  HGB 12.1 11.4* 11.8*  HCT 38.4 37.3 37.4  WBC 4.0 3.9 4.2  PLT 149* 156 164   Dg Chest Port 1 View  Result Date: 12/31/2016 CLINICAL DATA:  66 year old diabetic hypertensive female with hypoxia. Subsequent encounter. EXAM: PORTABLE CHEST 1 VIEW COMPARISON:  12/27/2016. FINDINGS: Cardiomegaly. Pulmonary vascular congestion most notable centrally. Improved aeration lung bases. Limited evaluation of retrocardiac region. Left central line tip proximal superior vena cava level. No gross pneumothorax. Endotracheal tube and nasogastric tube removed. IMPRESSION:  Endotracheal tube and nasogastric tube removed. Improved aeration lung bases. Limited evaluation retrocardiac region. Cardiomegaly with pulmonary vascular congestion most notable centrally. Electronically Signed   By: Lacy Duverney M.D.   On: 12/31/2016 08:52    ASSESSMENT / PLAN: Acute on chronic hypoxic hypercapnic respiratory failure secondary to pulmonary edema with volume overload and OSA/OHS Klebsiella UTI s/p severe septic shock  Metabolic Encephalopathy-resolved  P: Continue supplemental O2 to maintain O2 sats >92% and Bipap qhs  Continue bronchodilator therapy for now  Continue iv steroids wean as tolerated  Monitor chemistries she may require another dose of diamox  CXR in am  Continue out of bed to chair and early ambulation Pulmonary hygiene  Continue abx  Sonda Rumble, AGNP  Pulmonary/Critical Care Pager [phone] (please enter 7 digits) PCCM Consult Pager [phone] (please enter 7 digits)    PCCM ATTENDING ATTESTATION:  I have evaluated patient with the APP [place], reviewed database in its entirety and discussed care plan in detail. I agree with the above findings, assessment and plan. I again urge judicious use of loop diuretics which will exacerbate metabolic alkalosis and secondarily will exacerbate hypoventilation.  PCCM will sign off. Please call if we can be of further assistance  Billy Fischer, MD PCCM service Mobile [phone] Pager [phone]

## 2017-01-01 NOTE — Progress Notes (Signed)
PT Cancellation Note  Patient Details Name: Jasmine Osborn MRN: [id number] DOB: 04/14/1951   Cancelled Treatment:    Reason Eval/Treat Not Completed: Other (comment). Treatment attempted; pt busy with other staff for personal care. Re attempt at a later time/date as the schedule allows.    Scot DockHeidi E Shenee Wignall, PTA 01/01/2017, 12:40 PM

## 2017-01-02 LAB — HEMOGLOBIN A1C
Hgb A1c MFr Bld: 6 % — ABNORMAL HIGH (ref 4.8–5.6)
Mean Plasma Glucose: 126 mg/dL

## 2017-01-02 LAB — GLUCOSE, CAPILLARY
GLUCOSE-CAPILLARY: 231 mg/dL — AB (ref 65–99)
GLUCOSE-CAPILLARY: 252 mg/dL — AB (ref 65–99)
GLUCOSE-CAPILLARY: 310 mg/dL — AB (ref 65–99)
Glucose-Capillary: 278 mg/dL — ABNORMAL HIGH (ref 65–99)
Glucose-Capillary: 247 mg/dL — ABNORMAL HIGH (ref 65–99)

## 2017-01-02 MED ORDER — FUROSEMIDE 40 MG PO TABS: 40.0000 mg | ORAL_TABLET | Freq: Two times a day (BID) | ORAL | Status: DC

## 2017-01-02 MED ORDER — LOSARTAN POTASSIUM 50 MG PO TABS
50.0000 mg | ORAL_TABLET | Freq: Every day | ORAL | Status: DC
  Administered 2017-01-02 – 2017-01-03 (×2): 50 mg via ORAL
  Filled 2017-01-02 (×2): qty 1

## 2017-01-02 MED ORDER — FUROSEMIDE 40 MG PO TABS
40.0000 mg | ORAL_TABLET | Freq: Every day | ORAL | Status: DC
  Administered 2017-01-03: 40 mg via ORAL
  Filled 2017-01-02: qty 1

## 2017-01-02 MED ORDER — METHYLPREDNISOLONE SODIUM SUCC 125 MG IJ SOLR
60.0000 mg | INTRAMUSCULAR | Status: DC
  Administered 2017-01-03: 60 mg via INTRAVENOUS
  Filled 2017-01-02: qty 2

## 2017-01-02 NOTE — Progress Notes (Signed)
[facility] [place] [facility]   PATIENT NAME: Jasmine Osborn    MR#:  [id number]  DATE OF BIRTH:  04/22/1951  SUBJECTIVE:  CHIEF COMPLAINT:   Chief Complaint  Patient presents with  . Weakness   - Feels better, unable to tolerate BiPAP. Remains on 4 L nasal cannula -Physical therapy recommended rehabilitation.  REVIEW OF SYSTEMS:  Review of Systems  Constitutional: Positive for malaise/fatigue. Negative for chills and fever.  HENT: Negative for congestion, ear discharge, hearing loss and nosebleeds.   Eyes: Negative for blurred vision and double vision.  Respiratory: Positive for shortness of breath. Negative for cough and wheezing.   Cardiovascular: Positive for leg swelling. Negative for chest pain and palpitations.  Gastrointestinal: Negative for abdominal pain, constipation, diarrhea, nausea and vomiting.  Genitourinary: Negative for dysuria.  Musculoskeletal: Negative for myalgias.  Neurological: Negative for dizziness, speech change, focal weakness, seizures and headaches.  Psychiatric/Behavioral: Negative for depression.    DRUG ALLERGIES:  No Known Allergies  VITALS:  Blood pressure (!) 151/73, pulse 87, temperature 98.3 F (36.8 C), temperature source Oral, resp. rate 14, height 5' 4.02" (1.626 m), weight 115 kg (253 lb 8 oz), SpO2 97 %.  PHYSICAL EXAMINATION:  Physical Exam  GENERAL:  66 y.o.-year-old Obese patient sitting in the bed with no acute distress.  EYES: Pupils equal, round, reactive to light and accommodation. No scleral icterus. Extraocular muscles intact.  HEENT: Head atraumatic, normocephalic. Oropharynx and nasopharynx clear.  NECK:  Supple, no jugular venous distention. No thyroid enlargement, no tenderness.  LUNGS: Normal breath sounds bilaterally, no wheezing, rales,rhonchi or crepitation. No use of accessory muscles of respiration.  CARDIOVASCULAR: S1, S2 normal. No murmurs, rubs, or gallops.  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 Bowel sounds present. No organomegaly or mass.  EXTREMITIES: No cyanosis, or clubbing. 1+ pedal edema present NEUROLOGIC: Cranial nerves II through XII are intact. Muscle strength 5/5 in all extremities. Sensation intact. Gait not checked. Global weakness noted. PSYCHIATRIC: The patient is alert and oriented x 3. No confusion noted now SKIN: No obvious rash, lesion, or ulcer.    LABORATORY PANEL:   CBC  Recent Labs Lab 12/29/16 0637  WBC 4.2  HGB 11.8*  HCT 37.4  PLT 164   ------------------------------------------------------------------------------------------------------------------  Chemistries   Recent Labs Lab 01/01/17 0457  NA 139  K 3.9  CL 94*  CO2 37*  GLUCOSE 261*  BUN 40*  CREATININE 1.34*  CALCIUM 9.6  MG 2.0   ------------------------------------------------------------------------------------------------------------------  Cardiac Enzymes No results for input(s): TROPONINI in the last 168 hours. ------------------------------------------------------------------------------------------------------------------  RADIOLOGY:  No results found.  EKG:   Orders placed or performed during the hospital encounter of 12/22/16  . ED EKG within 10 minutes  . ED EKG within 10 minutes  . EKG 12-Lead  . EKG 12-Lead    ASSESSMENT AND PLAN:   66 year old female with past medical history significant for depression, history of psychosis, schizophrenia, diabetes, obesity and peripheral vascular disease admitted to ICU for acute respiratory failure.  #1 acute hypoxic respiratory failure-secondary to COPD exacerbation and diastolic CHF exacerbation. -Patient was in ICU, intubated, and then extubated on 12/27/2016. -Remains on 4 L oxygen at this time. Not on home oxygen. Patient is a mouth breather and is also very obese. -Has underlying sleep apnea or obesity  hypoventilation syndrome. Denies any prior sleep study are being on CPAP. -Unable to tolerate BiPAP here. Continue oxygen  support as tolerated. -Continue IV steroids- start to wean tomorrow, Also on IV Lasix - change to PO -Appreciate pulmonary consult. -Incentive spirometry encouraged. Continue inhalers and nebs as needed.  #2 diabetes mellitus-  low-dose Lantus as sugars are elevated from being on steroids. Continue sliding scale insulin. Hold her oral meds  #3 diastolic CHF-acute on chronic, EF greater than 60% but has pulmonary hypertension. Management as mentioned above.  #4 septic shock-secondary to Klebsiella UTI. Septic shock has resolved. Continue IV Ancef at this time.  #5 depression and anxiety-on risperidone due to history of psychosis. We'll hold off on Remeron for now but restart prior to discharge.  #6 DVT prophylaxis-continue Lovenox  #7 hypertension- as blood pressure is improving, restart some of her home medications   Physical therapy recommended rehabilitation.     All the records are reviewed and case discussed with Care Management/Social Workerr. Management plans discussed with the patient, family and they are in agreement.  CODE STATUS: Full Code  TOTAL TIME TAKING CARE OF THIS PATIENT: 37 minutes.   POSSIBLE D/C TOMORROW, DEPENDING ON CLINICAL CONDITION.   Jasmine Osborn M.D on 01/02/2017 at 2:26 PM  Between 7am to 6pm - Pager - [phone]  After 6pm go to [email or web address] - password [email or web address]  [facility]  Office  [phone]  CC: Primary care physician; Lauro Regulus., MD

## 2017-01-02 NOTE — Progress Notes (Signed)
Inpatient Diabetes Program Recommendations  AACE/ADA: New Consensus Statement on Inpatient Glycemic Control (2015)  Target Ranges:  Prepandial:   less than 140 mg/dL      Peak postprandial:   less than 180 mg/dL (1-2 hours)      Critically ill patients:  140 - 180 mg/dL   Results for Jasmine Osborn, Jasmine Osborn (MRN [id number]) as of 01/02/2017 08:29  Ref. Range 01/01/2017 07:43 01/01/2017 11:53 01/01/2017 16:51 01/01/2017 21:05 01/02/2017 08:09  Glucose-Capillary Latest Ref Range: 65 - 99 mg/dL 409262 (H) 811346 (H) 914271 (H) 231 (H) 252 (H)   Review of Glycemic Control  Diabetes history: DM2 Outpatient Diabetes medications: Metformin 500 mg BID, Amaryl 2 mg QAM Current orders for Inpatient glycemic control: Lantus 12 units QHS, Novolog 3 units TID with meals for meal coverage, Novolog 0-20 units ACHS  Inpatient Diabetes Program Recommendations: Insulin - Basal: If steroids are continued as ordered (Solumedrol 60 mg Q12H), please consider increasing Lantus to 15 units QHS.  Please keep in mind that Lantus will likely need to be adjusted as steroids are tapered. Insulin - Meal Coverage: If steroids are continued as ordered, please consider increasing meal coverage to Novolog 6 units TID with meals if patient eats at least 50% of meals.  Thanks, Orlando PennerMarie Beyza Bellino, RN, MSN, CDE Diabetes Coordinator Inpatient Diabetes Program [phone] (Team Pager from 8am to 5pm)

## 2017-01-02 NOTE — Discharge Instructions (Signed)
Heart Failure Clinic appointment on January 15, 2017 at 10:00am with Clarisa Kindredina Mateusz Neilan, FNP. Please call [phone] to reschedule.

## 2017-01-02 NOTE — Clinical Social Work Note (Signed)
CSW received [facility] number, which is [phone].  CSW continuing to follow patient's progress throughout discharge planning.  Ervin KnackEric R. Talah Cookston, MSW, Theresia MajorsLCSWA [phone]  01/02/2017 11:46 AM

## 2017-01-02 NOTE — Progress Notes (Signed)
qPhysical Therapy Treatment Patient Details Name: Jasmine Osborn MRN: [id number] DOB: 1951-01-20 Today's Date: 01/02/2017    History of Present Illness Patient is a 66 y.o. female admitted on 18 March for respiratory failure w/hypercapnia. Was intubated and extubated on 23 March. Patient had a UTI.    PT Comments    Pt reports sleeping well and ready for session.  To edge of bed with increased time but without assist.  Transferred to recliner at bedside with walker and min a x 1.  Pt was able to stand x 2 trials up to 2 minutes each.  Participated in exercises as described below.  Pt does c/o some soreness in L foot due to breakdown.  HR and O2 remained in 90's during session.  Pt educated on proper breathing techniques as she continues to mouth breath during most of session.  Overall increased activity tolerance today.   Follow Up Recommendations  SNF     Equipment Recommendations  None recommended by PT    Recommendations for Other Services       Precautions / Restrictions Precautions Precautions: Fall Restrictions Weight Bearing Restrictions: No Other Position/Activity Restrictions: L heel pressure ulcer    Mobility  Bed Mobility Overal bed mobility: Modified Independent;Needs Assistance Bed Mobility: Supine to Sit     Supine to sit: Modified independent (Device/Increase time);HOB elevated     General bed mobility comments: increased time but able to do without assist  Transfers Overall transfer level: Needs assistance Equipment used: Rolling walker (2 wheeled) Transfers: Sit to/from Stand Sit to Stand: Min assist         General transfer comment: No lob's today but reports soreness in foot.  Ambulation/Gait Ambulation/Gait assistance: Min assist Ambulation Distance (Feet): 3 Feet Assistive device: Rolling walker (2 wheeled) Gait Pattern/deviations: Step-to pattern   Gait velocity interpretation: <1.8 ft/sec, indicative of risk for recurrent  falls General Gait Details: small step to gait pattern   Stairs            Wheelchair Mobility    Modified Rankin (Stroke Patients Only)       Balance Overall balance assessment: Needs assistance Sitting-balance support: Bilateral upper extremity supported;Feet supported Sitting balance-Leahy Scale: Fair     Standing balance support: Bilateral upper extremity supported Standing balance-Leahy Scale: Fair                              Cognition Arousal/Alertness: Awake/alert Behavior During Therapy: WFL for tasks assessed/performed Overall Cognitive Status: Within Functional Limits for tasks assessed                                        Exercises Other Exercises Other Exercises: standing 2 minutes x 2 with marching in place x 10 Other Exercises: seated exercises toe taps, LAQ, marches, Ab/ad x 10    General Comments        Pertinent Vitals/Pain Pain Assessment: No/denies pain    Home Living                      Prior Function            PT Goals (current goals can now be found in the care plan section) Progress towards PT goals: Progressing toward goals    Frequency    Min 2X/week      PT  Plan Current plan remains appropriate    Co-evaluation             End of Session Equipment Utilized During Treatment: Oxygen   Patient left: in chair;with call bell/phone within reach;with chair alarm set         Time: [id number] PT Time Calculation (min) (ACUTE ONLY): 21 min  Charges:  $Therapeutic Exercise: 8-22 mins                    G Codes:       Danielle DessSarah Darica Goren, PTA 01/02/17, 9:27 AM

## 2017-01-03 LAB — BASIC METABOLIC PANEL
ANION GAP: 9 (ref 5–15)
BUN: 82 mg/dL — ABNORMAL HIGH (ref 6–20)
CALCIUM: 9.4 mg/dL (ref 8.9–10.3)
CO2: 28 mmol/L (ref 22–32)
CREATININE: 1.23 mg/dL — AB (ref 0.44–1.00)
Chloride: 100 mmol/L — ABNORMAL LOW (ref 101–111)
GFR, EST AFRICAN AMERICAN: 52 mL/min — AB (ref >60)
GFR, EST NON AFRICAN AMERICAN: 45 mL/min — AB (ref >60)
Glucose, Bld: 261 mg/dL — ABNORMAL HIGH (ref 65–99)
Potassium: 2.9 mmol/L — ABNORMAL LOW (ref 3.5–5.1)
SODIUM: 137 mmol/L (ref 135–145)

## 2017-01-03 LAB — CBC
HCT: 39 % (ref 35.0–47.0)
Hemoglobin: 12.4 g/dL (ref 12.0–16.0)
MCH: 27.3 pg (ref 26.0–34.0)
MCHC: 31.8 g/dL — ABNORMAL LOW (ref 32.0–36.0)
MCV: 85.9 fL (ref 80.0–100.0)
Platelets: 221 10*3/uL (ref 150–440)
RBC: 4.55 MIL/uL (ref 3.80–5.20)
RDW: 18.6 % — AB (ref 11.5–14.5)
WBC: 8.5 10*3/uL (ref 3.6–11.0)

## 2017-01-03 LAB — POTASSIUM: POTASSIUM: 3.7 mmol/L (ref 3.5–5.1)

## 2017-01-03 LAB — GLUCOSE, CAPILLARY
GLUCOSE-CAPILLARY: 243 mg/dL — AB (ref 65–99)
GLUCOSE-CAPILLARY: 170 mg/dL — AB (ref 65–99)
GLUCOSE-CAPILLARY: 270 mg/dL — AB (ref 65–99)

## 2017-01-03 LAB — MAGNESIUM: MAGNESIUM: 2.1 mg/dL (ref 1.7–2.4)

## 2017-01-03 MED ORDER — IPRATROPIUM-ALBUTEROL 0.5-2.5 (3) MG/3ML IN SOLN: 3.0000 mL | Freq: Four times a day (QID) | RESPIRATORY_TRACT | 0 refills | Status: DC | PRN

## 2017-01-03 MED ORDER — SENNOSIDES-DOCUSATE SODIUM 8.6-50 MG PO TABS: 1.0000 | ORAL_TABLET | Freq: Two times a day (BID) | ORAL | 2 refills | Status: DC

## 2017-01-03 MED ORDER — FUROSEMIDE 40 MG PO TABS: 40.0000 mg | ORAL_TABLET | Freq: Every day | ORAL | 2 refills | Status: DC

## 2017-01-03 MED ORDER — INSULIN ASPART 100 UNIT/ML ~~LOC~~ SOLN: 5.0000 [IU] | Freq: Three times a day (TID) | SUBCUTANEOUS | 11 refills | Status: DC

## 2017-01-03 MED ORDER — PREDNISONE 10 MG (21) PO TBPK: ORAL_TABLET | ORAL | 0 refills | Status: DC

## 2017-01-03 MED ORDER — RISPERIDONE 1 MG PO TABS: 1.0000 mg | ORAL_TABLET | Freq: Every day | ORAL | 0 refills | Status: AC

## 2017-01-03 MED ORDER — POTASSIUM CHLORIDE CRYS ER 20 MEQ PO TBCR
40.0000 meq | EXTENDED_RELEASE_TABLET | ORAL | Status: AC
  Administered 2017-01-03 (×3): 40 meq via ORAL
  Filled 2017-01-03 (×3): qty 2

## 2017-01-03 MED ORDER — INSULIN GLARGINE 100 UNIT/ML ~~LOC~~ SOLN: 12.0000 [IU] | Freq: Every day | SUBCUTANEOUS | 11 refills | Status: DC

## 2017-01-03 MED ORDER — BUDESONIDE 0.5 MG/2ML IN SUSP: 0.5000 mg | Freq: Two times a day (BID) | RESPIRATORY_TRACT | 2 refills | Status: DC | PRN

## 2017-01-03 MED ORDER — METOPROLOL TARTRATE 50 MG PO TABS: 50.0000 mg | ORAL_TABLET | Freq: Two times a day (BID) | ORAL | 2 refills | Status: DC

## 2017-01-03 MED ORDER — INSULIN ASPART 100 UNIT/ML ~~LOC~~ SOLN: 5.0000 [IU] | Freq: Three times a day (TID) | SUBCUTANEOUS | Status: DC

## 2017-01-03 MED ORDER — BUDESONIDE 0.5 MG/2ML IN SUSP: 0.5000 mg | Freq: Two times a day (BID) | RESPIRATORY_TRACT | Status: DC | PRN

## 2017-01-03 MED ORDER — IPRATROPIUM-ALBUTEROL 0.5-2.5 (3) MG/3ML IN SOLN: 3.0000 mL | Freq: Four times a day (QID) | RESPIRATORY_TRACT | Status: DC | PRN

## 2017-01-03 NOTE — Clinical Social Work Note (Signed)
Patient to be d/c'ed today to [facility] [place] [place].  Patient and family agreeable to plans will transport via ems RN to call report.  Leitha Hyppolite, MSW, LCSWA [phone]  

## 2017-01-03 NOTE — Progress Notes (Signed)
Inpatient Diabetes Program Recommendations  AACE/ADA: New Consensus Statement on Inpatient Glycemic Control (2015)  Target Ranges:  Prepandial:   less than 140 mg/dL      Peak postprandial:   less than 180 mg/dL (1-2 hours)      Critically ill patients:  140 - 180 mg/dL   Lab Results  Component Value Date   GLUCAP 170 (H) 01/03/2017   HGBA1C 6.0 (H) 12/29/2016     Review of Glycemic Control  Results for MALCOLM, HETZ (MRN [id number]) as of 01/03/2017 11:15  Ref. Range 01/02/2017 08:09 01/02/2017 12:10 01/02/2017 16:50 01/02/2017 21:13 01/03/2017 07:52  Glucose-Capillary Latest Ref Range: 65 - 99 mg/dL 409 (H) 811 (H) 914 (H) 247 (H) 170 (H)    Diabetes history: DM2 Outpatient Diabetes medications: Metformin 500 mg BID, Amaryl 2 mg QAM Current orders for Inpatient glycemic control: Lantus 12 units QHS, Novolog 3 units TID with meals for meal coverage, Novolog 0-20 units ACHS  Inpatient Diabetes Program Recommendations: Noted, steroids decreased to  qday (from q12)- CBG improved this am but post prandial blood sugar up.   If steroids are continued as ordered, please consider increasing meal coverage to Novolog 6 units TID with meals if patient eats at least 50% of meals.  Susette Racer, RN, BA, MHA, CDE Diabetes Coordinator Inpatient Diabetes Program  [phone] (Team Pager) [phone] [facility] Office) 01/03/2017 12:02 PM

## 2017-01-03 NOTE — Progress Notes (Signed)
Ems on floor to transport patient to [facility]. Patient to be d/c'd on 4l. No distress noted. Dressing to right neck dry and intact. Packet given to ems

## 2017-01-03 NOTE — Progress Notes (Signed)
qPhysical Therapy Treatment Patient Details Name: Jasmine Osborn MRN: [id number] DOB: 1951-06-21 Today's Date: 01/03/2017    History of Present Illness Patient is a 66 y.o. female admitted on 18 March for respiratory failure w/hypercapnia. Was intubated and extubated on 23 March. Patient had a UTI.    PT Comments    Pt is able to ambulate much farther than any previous effort in the hospital and though she normally does not need AD or O2 she was eager to try and ended up walking nearly 200 ft.  She had some expected fatigue and needed frequent brief standing rest breaks but ultimately did well and better than she or this PT expected.  Overall pt is still weak and not at her baseline but she showed great effort and made significant gains today.    Follow Up Recommendations  SNF     Equipment Recommendations  None recommended by PT    Recommendations for Other Services       Precautions / Restrictions Precautions Precautions: Fall Restrictions Weight Bearing Restrictions: No    Mobility  Bed Mobility               General bed mobility comments: pt in recliner on arrival, not tested  Transfers Overall transfer level: Modified independent Equipment used: Rolling walker (2 wheeled) Transfers: Sit to/from Stand Sit to Stand: Min guard         General transfer comment: cues for hand placement and sequencing, pt did not need direct physical assist  Ambulation/Gait Ambulation/Gait assistance: Min guard Ambulation Distance (Feet): 180 Feet Assistive device: Rolling walker (2 wheeled)       General Gait Details: Pt with shuffling, slow, but consistent cadence.  She needed regular brief standing rest breaks and reminders to breath through nose (on 4 liters via nasal cannula), O2 sats remained in the 90s the entire effort.     Stairs            Wheelchair Mobility    Modified Rankin (Stroke Patients Only)       Balance                                             Cognition Arousal/Alertness: Awake/alert Behavior During Therapy: WFL for tasks assessed/performed Overall Cognitive Status: Within Functional Limits for tasks assessed                                        Exercises General Exercises - Lower Extremity Ankle Circles/Pumps: 10 reps;AAROM Short Arc Quad: AAROM;AROM;10 reps;Both Heel Slides: AAROM;10 reps;Both Hip ABduction/ADduction: AAROM;AROM;10 reps;Both    General Comments        Pertinent Vitals/Pain Pain Assessment: No/denies pain    Home Living   Living Arrangements: Spouse/significant other                  Prior Function            PT Goals (current goals can now be found in the care plan section) Progress towards PT goals: Progressing toward goals    Frequency    Min 2X/week      PT Plan Current plan remains appropriate    Co-evaluation             End of Session Equipment Utilized During  Treatment: Gait belt;Oxygen Activity Tolerance: Patient tolerated treatment well;Patient limited by fatigue Patient left: with call bell/phone within reach;with chair alarm set;with family/visitor present   PT Visit Diagnosis: Muscle weakness (generalized) (M62.81);Pain;Difficulty in walking, not elsewhere classified (R26.2)     Time: 4098-1191 PT Time Calculation (min) (ACUTE ONLY): 24 min  Charges:  $Gait Training: 8-22 mins $Therapeutic Exercise: 8-22 mins                    G Codes:        Malachi Pro, DPT 01/03/2017, 4:03 PM

## 2017-01-03 NOTE — Progress Notes (Signed)
Report called to [facility] rn at [facility]. Patient status and discharge instruction gone over with nurse. Patient to be transport via ems on 4l. Will remove central line. Packet will be given to ems transport

## 2017-01-03 NOTE — Progress Notes (Signed)
Paged dr. Nemiah Commander to make aware of current potassium 3.7 and magnesium 2.1, waiting for orders

## 2017-01-03 NOTE — Progress Notes (Addendum)
central line to right ij removed. Patient placed in supine position. Sterile technique used, patient instructed to hold breath while catheter removed, catheter tip intacted, vasoling gauze and gauze applied to site, pressure held for 3 minutes. Site covered with tegaderm. No bleeding noted. Patient instruction to lay flat for . Ems called for transport. Made aware by ccmd patient had run of vtach. Patient resting in bed no c/o pain no distress noted. Telemetry was removed for dressing change and reapplied

## 2017-01-03 NOTE — Discharge Summary (Signed)
[employer] [facility] [place] [facility]   PATIENT NAME: Jasmine Osborn    MR#:  [id number]  DATE OF BIRTH:  30-Apr-1951  DATE OF ADMISSION:  12/22/2016   ADMITTING PHYSICIAN: Shane Crutch, MD  DATE OF DISCHARGE: 01/03/2017  PRIMARY CARE PHYSICIAN: Lauro Regulus., MD   ADMISSION DIAGNOSIS:   Renal failure [N19] Lethargy [R53.83] Demand ischemia (HCC) [I24.8] Elevated troponin I level [R74.8] Acute respiratory failure with hypoxia and hypercapnia (HCC) [J96.01, J96.02] Sepsis, due to unspecified organism (HCC) [A41.9] Hypotension, unspecified hypotension type [I95.9] Urinary tract infection without hematuria, site unspecified [N39.0] Acute renal failure, unspecified acute renal failure type (HCC) [N17.9]  DISCHARGE DIAGNOSIS:   Active Problems:   Respiratory failure with hypercapnia (HCC)   Pressure injury of skin   SECONDARY DIAGNOSIS:   Past Medical History:  Diagnosis Date  . Depression   . Diabetes mellitus without complication (HCC)   . Elevated lipids   . Hernia of abdominal wall   . Hypertension   . Psychosis   . PVD (peripheral vascular disease) (HCC)    heel ulcer Lt     HOSPITAL COURSE:   66 year old female with past medical history significant for depression, history of psychosis, schizophrenia, diabetes, obesity and peripheral vascular disease admitted to ICU for acute respiratory failure.  #1 acute hypoxic respiratory failure-secondary to COPD exacerbation and diastolic CHF exacerbation. -Patient was in ICU, intubated, and then extubated on 12/27/2016. -Remains on 4 L oxygen at this time. Not on home oxygen. Patient is a mouth breather and is also very obese. -Has underlying sleep apnea or obesity hypoventilation syndrome. Denies any prior sleep study are being on CPAP. -Continue oxygen support as tolerated. Check nocturnal pulse oximetry at the rehab -received IV steroids- wean to oral steroids, also decrease lasix  dose and change to PO qdaily -Appreciate pulmonary consult. -Incentive spirometry encouraged. Continue inhalers and nebs as needed.  #2 diabetes mellitus-  low-dose Lantus as sugars are elevated from being on steroids. Restarted metformin at discharge. Continue to hold amaryl. novolog premeals started  #3 diastolic CHF-acute on chronic, EF greater than 60% but has pulmonary hypertension. Management as mentioned above.  #4 septic shock-secondary to Klebsiella UTI. Septic shock has resolved. Finished Ancef at this time.  #5 depression and anxiety-on risperidone due to history of psychosis. We'll hold off on Remeron for now.  #6 hypertension- as blood pressure is improving, restarted metoprolol and losartan  #7 Hypokalemia- due to being on high dose lasix, being replaced. Recheck later today   Physical therapy recommended rehabilitation  DISCHARGE CONDITIONS:   Guarded  CONSULTS OBTAINED:    Pulmonary critical care consultation by Dr. Sung Amabile  DRUG ALLERGIES:   No Known Allergies DISCHARGE MEDICATIONS:   Allergies as of 01/03/2017   No Known Allergies     Medication List    STOP taking these medications   amLODipine 5 MG tablet Commonly known as:  NORVASC   fenofibrate 160 MG tablet   glimepiride 2 MG tablet Commonly known as:  AMARYL   metoprolol succinate 50 MG 24 hr tablet Commonly known as:  TOPROL-XL   mirtazapine 30 MG tablet Commonly known as:  REMERON   torsemide 5 MG tablet Commonly known as:  DEMADEX     TAKE these medications   aspirin 81 MG tablet Take 81 mg by mouth daily.   budesonide 0.5 MG/2ML nebulizer solution Commonly known as:  PULMICORT Take 2 mLs (0.5 mg total) by nebulization 2 (two) times daily as needed (wheezing).  furosemide 40 MG tablet Commonly known as:  LASIX Take 1 tablet (40 mg total) by mouth daily. Start taking on:  01/04/2017   insulin aspart 100 UNIT/ML injection Commonly known as:  novoLOG Inject 5 Units  into the skin 3 (three) times daily with meals.   insulin glargine 100 UNIT/ML injection Commonly known as:  LANTUS Inject 0.12 mLs (12 Units total) into the skin at bedtime.   ipratropium-albuterol 0.5-2.5 (3) MG/3ML Soln Commonly known as:  DUONEB Take 3 mLs by nebulization every 6 (six) hours as needed.   KLOR-CON 10 10 MEQ tablet Generic drug:  potassium chloride Take 20 mEq by mouth daily.   latanoprost 0.005 % ophthalmic solution Commonly known as:  XALATAN USE 1 DROP IN [facility] EYE AT BEDTIME   losartan 50 MG tablet Commonly known as:  COZAAR Take 50 mg by mouth daily.   megestrol 40 MG tablet Commonly known as:  MEGACE Take 80 mg by mouth 2 (two) times daily.   metFORMIN 500 MG tablet Commonly known as:  GLUCOPHAGE Take 500 mg by mouth 2 (two) times daily with a meal.   metoprolol 50 MG tablet Commonly known as:  LOPRESSOR Take 1 tablet (50 mg total) by mouth 2 (two) times daily.   predniSONE 10 MG (21) Tbpk tablet Commonly known as:  STERAPRED UNI-PAK 21 TAB 6 tabs PO x 1 day 5 tabs PO x 1 day 4 tabs PO x 1 day 3 tabs PO x 1 day 2 tabs PO x 1 day 1 tab PO x 1 day and stop   risperiDONE 1 MG tablet Commonly known as:  RISPERDAL Take 1 tablet (1 mg total) by mouth at bedtime. What changed:  how much to take   senna-docusate 8.6-50 MG tablet Commonly known as:  Senokot-S Take 1 tablet by mouth 2 (two) times daily.        DISCHARGE INSTRUCTIONS:   1. PCP f/u in 1-2 weeks 2. Chronic left heel ulcer- f/u with podiatry in 3 weeks 3. Pulmonary f/u in 2-3 weeks  DIET:   Cardiac diet and Diabetic diet  ACTIVITY:   Activity as tolerated  OXYGEN:   Home Oxygen: Yes.    Oxygen Delivery: 4 liters/min via Patient connected to nasal cannula oxygen  DISCHARGE LOCATION:   nursing home   If you experience worsening of your admission symptoms, develop shortness of breath, life threatening emergency, suicidal or homicidal thoughts you must seek medical  attention immediately by calling 911 or calling your MD immediately  if symptoms less severe.  You Must read complete instructions/literature along with all the possible adverse reactions/side effects for all the Medicines you take and that have been prescribed to you. Take any new Medicines after you have completely understood and accpet all the possible adverse reactions/side effects.   Please note  You were cared for by a hospitalist during your hospital stay. If you have any questions about your discharge medications or the care you received while you were in the hospital after you are discharged, you can call the unit and asked to speak with the hospitalist on call if the hospitalist that took care of you is not available. Once you are discharged, your primary care physician will handle any further medical issues. Please note that NO REFILLS for any discharge medications will be authorized once you are discharged, as it is imperative that you return to your primary care physician (or establish a relationship with a primary care physician if you do not  have one) for your aftercare needs so that they can reassess your need for medications and monitor your lab values.    On the day of Discharge:  VITAL SIGNS:   Blood pressure 133/60, pulse 92, temperature 97.4 F (36.3 C), temperature source Oral, resp. rate 18, height 5' 4.02" (1.626 m), weight 114.4 kg (252 lb 1.6 oz), SpO2 95 %.  PHYSICAL EXAMINATION:    GENERAL:  66 y.o.-year-old Obese patient sitting in the bed with no acute distress.  EYES: Pupils equal, round, reactive to light and accommodation. No scleral icterus. Extraocular muscles intact.  HEENT: Head atraumatic, normocephalic. Oropharynx and nasopharynx clear.  NECK:  Supple, no jugular venous distention. No thyroid enlargement, no tenderness.  LUNGS: Normal breath sounds bilaterally, no wheezing, rales,rhonchi or crepitation. No use of accessory muscles of respiration.    CARDIOVASCULAR: S1, S2 normal. No murmurs, rubs, or gallops.  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 Bowel sounds present. No organomegaly or mass.  EXTREMITIES: No cyanosis, or clubbing. 2+ pedal edema present, left heel neuropathic ulcer- dressing in place NEUROLOGIC: Cranial nerves II through XII are intact. Muscle strength 5/5 in all extremities. Sensation intact. Gait not checked. Global weakness noted. PSYCHIATRIC: The patient is alert and oriented x 3. No confusion noted now SKIN: No obvious rash, lesion, or ulcer.     DATA REVIEW:   CBC  Recent Labs Lab 01/03/17 0507  WBC 8.5  HGB 12.4  HCT 39.0  PLT 221    Chemistries   Recent Labs Lab 01/01/17 0457 01/03/17 0507  NA 139 137  K 3.9 2.9*  CL 94* 100*  CO2 37* 28  GLUCOSE 261* 261*  BUN 40* 82*  CREATININE 1.34* 1.23*  CALCIUM 9.6 9.4  MG 2.0  --      Microbiology Results  Results for orders placed or performed during the hospital encounter of 12/22/16  Blood culture (routine x 2)     Status: None   Collection Time: 12/22/16  3:46 PM  Result Value Ref Range Status   Specimen Description BLOOD LEFT FOREARM  Final   Special Requests BOTTLES DRAWN AEROBIC AND ANAEROBIC BCAV  Final   Culture NO GROWTH 5 DAYS  Final   Report Status 12/27/2016 FINAL  Final  Blood culture (routine x 2)     Status: None   Collection Time: 12/22/16  3:47 PM  Result Value Ref Range Status   Specimen Description BLOOD RIGHT ASSIST CONTROL  Final   Special Requests BOTTLES DRAWN AEROBIC AND ANAEROBIC BCAV  Final   Culture NO GROWTH 5 DAYS  Final   Report Status 12/27/2016 FINAL  Final  Urine culture     Status: Abnormal   Collection Time: 12/22/16  4:50 PM  Result Value Ref Range Status   Specimen Description URINE, CATHETERIZED  Final   Special Requests Normal  Final   Culture >=100,000 COLONIES/mL KLEBSIELLA PNEUMONIAE (A)  Final   Report  Status 12/25/2016 FINAL  Final   Organism ID, Bacteria KLEBSIELLA PNEUMONIAE (A)  Final      Susceptibility   Klebsiella pneumoniae - MIC*    AMPICILLIN RESISTANT Resistant     CEFAZOLIN <=4 SENSITIVE Sensitive     CEFTRIAXONE <=1 SENSITIVE Sensitive     CIPROFLOXACIN <=0.25 SENSITIVE Sensitive     GENTAMICIN <=1 SENSITIVE Sensitive     IMIPENEM <=0.25 SENSITIVE Sensitive     NITROFURANTOIN <=16 SENSITIVE Sensitive     TRIMETH/SULFA <=20  SENSITIVE Sensitive     AMPICILLIN/SULBACTAM <=2 SENSITIVE Sensitive     PIP/TAZO <=4 SENSITIVE Sensitive     Extended ESBL NEGATIVE Sensitive     * >=100,000 COLONIES/mL KLEBSIELLA PNEUMONIAE  MRSA PCR Screening     Status: None   Collection Time: 12/22/16 10:22 PM  Result Value Ref Range Status   MRSA by PCR NEGATIVE NEGATIVE Final    Comment:        The GeneXpert MRSA Assay (FDA approved for NASAL specimens only), is one component of a comprehensive MRSA colonization surveillance program. It is not intended to diagnose MRSA infection nor to guide or monitor treatment for MRSA infections.   Culture, respiratory (tracheal aspirate)     Status: None   Collection Time: 12/23/16  3:32 PM  Result Value Ref Range Status   Specimen Description TRACHEAL ASPIRATE  Final   Special Requests NONE  Final   Gram Stain   Final    MODERATE WBC PRESENT, PREDOMINANTLY PMN NO ORGANISMS SEEN    Culture   Final    NO GROWTH 2 DAYS Performed at [facility], [address], [place], [place] [address]    Report Status 12/25/2016 FINAL  Final    RADIOLOGY:  No results found.   Management plans discussed with the patient, family and they are in agreement.  CODE STATUS:     Code Status Orders        Start     Ordered   12/22/16 1823  Full code  Continuous     12/22/16 1825    Code Status History    Date Active Date Inactive Code Status Order ID Comments User Context   This patient has a current code status but no historical code  status.    Advance Directive Documentation     Most Recent Value  Type of Advance Directive  Living will, Healthcare Power of Attorney  Pre-existing out of facility DNR order (yellow form or pink MOST form)  -  "MOST" Form in Place?  -      TOTAL TIME TAKING CARE OF THIS PATIENT: 39 minutes.    Jasmine Osborn M.D on 01/03/2017 at 12:25 PM  Between 7am to 6pm - Pager - [phone]  After 6pm go to [email or web address] - Social research officer, government  [facility] [place] [facility]  Office  [phone]  CC: Primary care physician; Lauro Regulus., MD   Note: This dictation was prepared with Dragon dictation along with smaller phrase technology. Any transcriptional errors that result from this process are unintentional.

## 2017-01-15 ENCOUNTER — Ambulatory Visit: Payer: Medicare Other | Admitting: Family

## 2017-01-21 ENCOUNTER — Other Ambulatory Visit
Admission: RE | Admit: 2017-01-21 | Discharge: 2017-01-21 | Disposition: A | Discharge status: Home / Self Care | Payer: 59 | Source: Ambulatory Visit | Attending: Family Medicine | Admitting: Family Medicine

## 2017-01-21 DIAGNOSIS — R0602 Shortness of breath: Secondary | ICD-10-CM | POA: Diagnosis present

## 2017-01-21 LAB — BRAIN NATRIURETIC PEPTIDE: B Natriuretic Peptide: 184 pg/mL — ABNORMAL HIGH (ref 0.0–100.0)

## 2017-01-27 ENCOUNTER — Other Ambulatory Visit: Payer: Self-pay | Admitting: Obstetrics and Gynecology

## 2017-01-27 DIAGNOSIS — N8502 Endometrial intraepithelial neoplasia [EIN]: Secondary | ICD-10-CM

## 2017-05-08 ENCOUNTER — Ambulatory Visit
Admission: RE | Admit: 2017-05-08 | Discharge: 2017-05-08 | Disposition: A | Discharge status: Home / Self Care | Payer: Medicare HMO | Source: Ambulatory Visit | Attending: Obstetrics and Gynecology | Admitting: Obstetrics and Gynecology

## 2017-05-08 DIAGNOSIS — N800 Endometriosis of uterus: Secondary | ICD-10-CM | POA: Diagnosis not present

## 2017-05-08 DIAGNOSIS — K439 Ventral hernia without obstruction or gangrene: Secondary | ICD-10-CM | POA: Insufficient documentation

## 2017-05-08 DIAGNOSIS — R938 Abnormal findings on diagnostic imaging of other specified body structures: Secondary | ICD-10-CM | POA: Insufficient documentation

## 2017-05-08 DIAGNOSIS — N8502 Endometrial intraepithelial neoplasia [EIN]: Secondary | ICD-10-CM | POA: Diagnosis present

## 2017-05-08 LAB — POCT I-STAT CREATININE: Creatinine, Ser: 1.7 mg/dL — ABNORMAL HIGH (ref 0.44–1.00)

## 2017-05-08 MED ORDER — GADOBENATE DIMEGLUMINE 529 MG/ML IV SOLN
10.0000 mL | Freq: Once | INTRAVENOUS | Status: AC | PRN
  Administered 2017-05-08: 10 mL via INTRAVENOUS

## 2017-06-04 ENCOUNTER — Inpatient Hospital Stay: Payer: 59 | Attending: Obstetrics and Gynecology | Admitting: Obstetrics and Gynecology

## 2017-06-04 ENCOUNTER — Encounter: Payer: Self-pay | Admitting: Obstetrics and Gynecology

## 2017-06-04 DIAGNOSIS — E1151 Type 2 diabetes mellitus with diabetic peripheral angiopathy without gangrene: Secondary | ICD-10-CM | POA: Insufficient documentation

## 2017-06-04 DIAGNOSIS — Z6841 Body Mass Index (BMI) 40.0 and over, adult: Secondary | ICD-10-CM | POA: Diagnosis not present

## 2017-06-04 DIAGNOSIS — E785 Hyperlipidemia, unspecified: Secondary | ICD-10-CM | POA: Diagnosis not present

## 2017-06-04 DIAGNOSIS — N8502 Endometrial intraepithelial neoplasia [EIN]: Secondary | ICD-10-CM | POA: Diagnosis not present

## 2017-06-04 DIAGNOSIS — R9389 Abnormal findings on diagnostic imaging of other specified body structures: Secondary | ICD-10-CM

## 2017-06-04 DIAGNOSIS — I10 Essential (primary) hypertension: Secondary | ICD-10-CM | POA: Insufficient documentation

## 2017-06-04 DIAGNOSIS — Z7982 Long term (current) use of aspirin: Secondary | ICD-10-CM | POA: Insufficient documentation

## 2017-06-04 DIAGNOSIS — F329 Major depressive disorder, single episode, unspecified: Secondary | ICD-10-CM | POA: Diagnosis not present

## 2017-06-04 DIAGNOSIS — Z7984 Long term (current) use of oral hypoglycemic drugs: Secondary | ICD-10-CM | POA: Insufficient documentation

## 2017-06-04 DIAGNOSIS — Z79899 Other long term (current) drug therapy: Secondary | ICD-10-CM

## 2017-06-04 HISTORY — DX: Abnormal findings on diagnostic imaging of other specified body structures: R93.89

## 2017-06-04 NOTE — Progress Notes (Signed)
  Oncology Nurse Navigator Documentation Chaperoned pelvic exam and EMbx Navigator Location: [facility] (06/04/17 1500)   )Navigator Encounter Type: Initial GynOnc (06/04/17 1500)                     Patient Visit Type: GynOnc (06/04/17 1500)                              Time Spent with Patient: 30 (06/04/17 1500)

## 2017-06-04 NOTE — Progress Notes (Signed)
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   Subjective:  Jasmine Osborn is a 66 y.o. female who is seen in consultation from Dr. 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 Her care is complicated by morbid obesity with a BMI of 47.   At her consultation visit we recommended weight loss and Megace with imaging for surveillance. She has been following up with Dr. Dalbert Garnet.  She followed up for repeat ultrasound on 08/22/16 to measure her endometrial stripe  TVUS11/17:  Ut wnl Endometrium= 20.31 mm Abn postmenopause and increasing compared to baseline. Neither ov seen  TUVS 3/18: Ut anteverted (images suboptimal due to pt body habitus) Endo = 22.53mm No FF seen in CDSs Bilat adns imaged Could not find ovs due to pt body habitus  MRI pelvis 05/08/2017  Uterus: Measures 14.8 x 10.1 by 13.5 cm (volume = 1060 cm^3). No fibroids identified. Diffuse endometrial thickening is seen measuring up to 28 mm. Irregularity of endometrial-myometrial junction is seen, with numerous cystic spaces throughout the uterine myometrium in the uterine corpus and fundus are consistent with severe adenomyosis. Normal appearing cervix.  Right ovary: Not well visualized, however no adnexal mass identified.  Left ovary: Not well visualized, however no adnexal mass identified.  Other: No abnormal free fluid.  Musculoskeletal: Large irregular rim enhancing fluid collection is seen within the bilateral ischioanal fossae, left side greater than right. This is incompletely visualized, but measures approximately 4 x 14 cm. This is suspicious for a pelvic floor soft tissue abscess.  IMPRESSION: Abnormal endometrial thickening measuring 28 mm.  Differential diagnosis includes endometrial hyperplasia and endometrial Carcinoma.   She presents to clinic today for endometrial biopsy. She is taking Megace 80 mg BID.    Gynecologic Oncology History  Jasmine Osborn is a pleasant female who is seen in consultation from Dr. 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 Her care is complicated by morbid obesity with a BMI of 47.   Dr Dalbert Garnet was unable to find cervix despite positioning and multpile providers trying. She went to the OR for exam under anesthesia, vaginoscopy, attempted Fractional D&C/Hysteroscopy failed. They were unable to place her in sufficient [place] for visualization of the cervix.   TVUS 02/13/2016  No IUD visualized; Uterus: 8.5 x 7.3 x 6.7 cmEM Stripe 14.61 mm. No masses; neither ovary seen.   In the past she had a US pelvis in 2011 that revealed a stripe that was 23.8 mm.   Provera 10 mg daily but that made her bleed so she stopped. No bleeding in 3 months.     Past Medical History: Past Medical History:  Diagnosis Date  . Depression   . Diabetes mellitus without complication (HCC)   . Elevated lipids   . Hernia of abdominal wall   . Hypertension   . Psychosis   . PVD (peripheral vascular disease) (HCC)    heel ulcer Lt     Past Surgical History: Past Surgical History:  Procedure Laterality Date  . COLON SURGERY    . DILATION AND CURETTAGE OF UTERUS    . HERNIA REPAIR    . TIBIA FRACTURE SURGERY Left     Past Gynecologic History:  As per HPI  OB History: P2  Family History: Family History  Problem Relation  Age of Onset  . Valvular heart disease Father     Social History: Social History   Social History  . Marital status: Married    Spouse name: N/A  . Number of children: N/A  . Years of education: N/A   Occupational History  . Not on file.   Social History Main Topics  . Smoking status: Former Games developer  .  Smokeless tobacco: Never Used  . Alcohol use No  . Drug use: No  . Sexual activity: Not on file   Other Topics Concern  . Not on file   Social History Narrative  . No narrative on file    Allergies: No Known Allergies  Current Medications: Current Outpatient Prescriptions  Medication Sig Dispense Refill  . amLODipine (NORVASC) 5 MG tablet Take 5 mg by mouth daily.    Marland Kitchen aspirin 81 MG tablet Take 81 mg by mouth daily.    . fenofibrate 160 MG tablet Take 160 mg by mouth daily.    Marland Kitchen KLOR-CON 10 10 MEQ tablet Take 20 mEq by mouth daily.   0  . latanoprost (XALATAN) 0.005 % ophthalmic solution USE 1 DROP IN [facility] EYE AT BEDTIME    . losartan (COZAAR) 50 MG tablet Take 50 mg by mouth daily.    . megestrol (MEGACE) 40 MG tablet Take 80 mg by mouth 2 (two) times daily.  3  . metFORMIN (GLUCOPHAGE) 500 MG tablet Take 500 mg by mouth 2 (two) times daily with a meal.    . metoprolol (LOPRESSOR) 50 MG tablet Take 1 tablet (50 mg total) by mouth 2 (two) times daily. 60 tablet 2  . mirtazapine (REMERON) 30 MG tablet Take 30 mg by mouth at bedtime.    . risperiDONE (RISPERDAL) 1 MG tablet Take 1 tablet (1 mg total) by mouth at bedtime. 30 tablet 0  . torsemide (DEMADEX) 20 MG tablet Take 20 mg by mouth daily.     No current facility-administered medications for this visit.     Review of Systems General: no complaints  HEENT: no complaints  Lungs: SOB, cough  Cardiac: no complaints  GI: diarrhea/constipation  GU: no complaints  Musculoskeletal: no complaints  Extremities: wraps on both legs and ulcer  Skin: no complaints  Neuro: no complaints  Endocrine: no complaints  Psych: no complaints       Objective:  Physical Examination:  BP 111/73   Pulse 97   Temp 98 F (36.7 C) (Tympanic)   Resp 20   Ht 5' 4.02" (1.626 m)      ECOG Performance Status: 0 - Asymptomatic  General appearance: alert, cooperative and appears stated age HEENT:PERRLA, extra ocular movement intact and  sclera clear, anicteric Abdomen: soft, protuberant, non-tender, without masses or organomegaly, obvious large ventral hernia  Extremities: mild edema.  Neurological exam reveals alert, oriented, normal speech, no focal findings or movement disorder noted.  Pelvic: exam chaperoned by nurse;  Vulva: normal appearing vulva with no masses, tenderness or lesions; Vagina: normal vagina; Cervix: normal, no gross lesions; Adnexa: no masses but limited by habitus; Uterus: unable to palpate, limited by habitus, Rectal: not indicated    EMBx performed.   The risks and benefits of the procedure were reviewed and informed consent obtained. Time out was performed. The patient received pre-procedure teaching and expressed understanding. The post-procedure instructions were reviewed with the patient and she expressed understanding. The patient does not have any barriers to learning.  Speculum placed in the vaginal vault and cervix identified.  Cervix cleansed with Betadine and anterior lip grasped with a tenaculum. The pipelle was inserted to 14 cm. The specimen was obtained without difficult; significant tissue. The tenaculum was removed and hemostasis adequate.   Post-procedure evaluation the patient was stable without complaints.   Lab Review n/a  Radiologic Imaging: n/a    Assessment:  Jasmine Osborn is a 66 y.o. female diagnosed with EIN, simple s/p IUD, suspect IUD expulsion given not visualized on radiologic imaging and string not present, with increasing EMS thickness despite Megace therapy. Difficult exam due to obesity and anatomical considerations. Medical co-morbidities complicating care: obesity.  Plan:     Visit Diagnoses    EIN (endometrial intraepithelial neoplasia)    -  Primary      Agree with increasing Megace dosage and frequency. We obtained EMBx today and will send rush.   I asked about her weight and encouraged continued weight loss, we discussed chair exercises and  weight lifting.   Follow up as needed.    A total of 30 minutes were spent with the patient/family today; 50% was spent in education, counseling and coordination of care for EIN  Artelia Laroche, MD    CC:  Christeen Douglas, MD

## 2017-06-04 NOTE — Progress Notes (Signed)
No gyn concerns. Cyst on back .

## 2017-06-05 ENCOUNTER — Telehealth: Payer: Self-pay | Admitting: *Deleted

## 2017-06-05 ENCOUNTER — Other Ambulatory Visit: Payer: Self-pay | Admitting: *Deleted

## 2017-06-05 NOTE — Telephone Encounter (Signed)
Path lab called, has questions regarding orders, please return call [phone]

## 2017-06-05 NOTE — Telephone Encounter (Signed)
Spoke with Jasmine Osborn in pathology. Pipelle was used for endometrial tissue sample.

## 2017-06-06 LAB — SURGICAL PATHOLOGY

## 2017-06-10 ENCOUNTER — Telehealth: Payer: Self-pay

## 2017-06-10 NOTE — Telephone Encounter (Signed)
  Oncology Nurse Navigator Documentation Voicemail left with Ms. Jasmine Osborn to return call for biopsy results. Per Dr. Sonia SideSecord continue Megace 80mg  twice a day and return to clinic in 3 months. Will make appointment changes. Navigator Location: [facility] (06/10/17 1100)   )Navigator Encounter Type: Diagnostic Results;Telephone (06/10/17 1100)                     Patient Visit Type: GynOnc (06/10/17 1100)                              Time Spent with Patient: 15 (06/10/17 1100)

## 2017-06-11 ENCOUNTER — Telehealth: Payer: Self-pay

## 2017-06-11 NOTE — Telephone Encounter (Signed)
  Oncology Nurse Navigator Documentation Notified of biopsy results and appointment change for 3 month follow up per Dr. Sonia SideSecord.  Surgical pathology  Order: [id number]  Status:  Final result  Visible to patient:  No (Not Released)  Next appt:  09/10/2017 at 11:00 AM in Oncology ([facility])  Dx:  Increased endometrial stripe thickness  Component 7d ago   SURGICAL PATHOLOGY Surgical Pathology  CASE: [id number]  PATIENT: Jasmine Osborn  Surgical Pathology Report      SPECIMEN SUBMITTED:  A. Endometrium; bx   CLINICAL HISTORY:  None provided   PRE-OPERATIVE DIAGNOSIS:  None provided   POST-OPERATIVE DIAGNOSIS:  None provided.      DIAGNOSIS:  A. ENDOMETRIUM; BIOPSY:  - PROMINENT PROGESTIN EFFECT.  - NEGATIVE FOR HYPERPLASIA, ATYPIA AND MALIGNANCY.    GROSS DESCRIPTION:   A. Labeled: endometrium   Tissue fragment(s): multiple   Size: aggregate, 2.5 x 2.4 x 0.2 cm   Description: tan to brown fragments   Entirely submitted in one cassette(s).     Final Diagnosis performed by Glenice Bowana Baker, MD.  Electronically signed  06/06/2017 1:38:53PM                Navigator Location: [facility] (06/11/17 0900)   )Navigator Encounter Type: Telephone;Diagnostic Results (06/11/17 0900)                     Patient Visit Type: GynOnc (06/11/17 0900)                              Time Spent with Patient: 15 (06/11/17 0900)

## 2017-06-18 ENCOUNTER — Ambulatory Visit: Payer: Medicare Other

## 2017-07-08 ENCOUNTER — Other Ambulatory Visit: Payer: Self-pay | Admitting: Obstetrics and Gynecology

## 2017-07-08 DIAGNOSIS — R188 Other ascites: Secondary | ICD-10-CM

## 2017-07-14 ENCOUNTER — Ambulatory Visit
Admission: RE | Admit: 2017-07-14 | Discharge: 2017-07-14 | Disposition: A | Discharge status: Home / Self Care | Payer: Medicare HMO | Source: Ambulatory Visit | Attending: Obstetrics and Gynecology | Admitting: Obstetrics and Gynecology

## 2017-07-14 DIAGNOSIS — K8020 Calculus of gallbladder without cholecystitis without obstruction: Secondary | ICD-10-CM | POA: Diagnosis not present

## 2017-07-14 DIAGNOSIS — I700 Atherosclerosis of aorta: Secondary | ICD-10-CM | POA: Diagnosis not present

## 2017-07-14 DIAGNOSIS — K439 Ventral hernia without obstruction or gangrene: Secondary | ICD-10-CM | POA: Insufficient documentation

## 2017-07-14 DIAGNOSIS — R188 Other ascites: Secondary | ICD-10-CM | POA: Insufficient documentation

## 2017-07-14 DIAGNOSIS — R9389 Abnormal findings on diagnostic imaging of other specified body structures: Secondary | ICD-10-CM | POA: Diagnosis not present

## 2017-07-14 LAB — POCT I-STAT CREATININE: Creatinine, Ser: 1.4 mg/dL — ABNORMAL HIGH (ref 0.44–1.00)

## 2017-07-14 MED ORDER — IOPAMIDOL (ISOVUE-300) INJECTION 61%
100.0000 mL | Freq: Once | INTRAVENOUS | Status: AC | PRN
Start: 2017-07-14 — End: 2017-07-14
  Administered 2017-07-14: 80 mL via INTRAVENOUS

## 2017-09-10 ENCOUNTER — Inpatient Hospital Stay: Payer: 59

## 2017-11-02 ENCOUNTER — Encounter: Payer: Self-pay | Admitting: Emergency Medicine

## 2017-11-02 ENCOUNTER — Emergency Department: Payer: Medicare HMO

## 2017-11-02 ENCOUNTER — Inpatient Hospital Stay
Admission: EM | Admit: 2017-11-02 | Discharge: 2017-11-08 | DRG: 189 | Disposition: A | Discharge status: Home Health | Payer: Medicare HMO | Attending: Internal Medicine | Admitting: Internal Medicine

## 2017-11-02 DIAGNOSIS — I5082 Biventricular heart failure: Secondary | ICD-10-CM | POA: Diagnosis present

## 2017-11-02 DIAGNOSIS — E785 Hyperlipidemia, unspecified: Secondary | ICD-10-CM | POA: Diagnosis present

## 2017-11-02 DIAGNOSIS — N179 Acute kidney failure, unspecified: Secondary | ICD-10-CM | POA: Diagnosis not present

## 2017-11-02 DIAGNOSIS — R0600 Dyspnea, unspecified: Secondary | ICD-10-CM | POA: Diagnosis not present

## 2017-11-02 DIAGNOSIS — Z6841 Body Mass Index (BMI) 40.0 and over, adult: Secondary | ICD-10-CM

## 2017-11-02 DIAGNOSIS — E1151 Type 2 diabetes mellitus with diabetic peripheral angiopathy without gangrene: Secondary | ICD-10-CM | POA: Diagnosis present

## 2017-11-02 DIAGNOSIS — I481 Persistent atrial fibrillation: Secondary | ICD-10-CM | POA: Diagnosis present

## 2017-11-02 DIAGNOSIS — E872 Acidosis: Secondary | ICD-10-CM | POA: Diagnosis not present

## 2017-11-02 DIAGNOSIS — Z7982 Long term (current) use of aspirin: Secondary | ICD-10-CM | POA: Diagnosis not present

## 2017-11-02 DIAGNOSIS — H409 Unspecified glaucoma: Secondary | ICD-10-CM | POA: Diagnosis present

## 2017-11-02 DIAGNOSIS — Z8249 Family history of ischemic heart disease and other diseases of the circulatory system: Secondary | ICD-10-CM | POA: Diagnosis not present

## 2017-11-02 DIAGNOSIS — R0902 Hypoxemia: Secondary | ICD-10-CM

## 2017-11-02 DIAGNOSIS — Z87891 Personal history of nicotine dependence: Secondary | ICD-10-CM | POA: Diagnosis not present

## 2017-11-02 DIAGNOSIS — Z66 Do not resuscitate: Secondary | ICD-10-CM | POA: Diagnosis present

## 2017-11-02 DIAGNOSIS — I272 Pulmonary hypertension, unspecified: Secondary | ICD-10-CM | POA: Diagnosis present

## 2017-11-02 DIAGNOSIS — R0602 Shortness of breath: Secondary | ICD-10-CM | POA: Diagnosis present

## 2017-11-02 DIAGNOSIS — N390 Urinary tract infection, site not specified: Secondary | ICD-10-CM | POA: Diagnosis present

## 2017-11-02 DIAGNOSIS — G9341 Metabolic encephalopathy: Secondary | ICD-10-CM | POA: Diagnosis not present

## 2017-11-02 DIAGNOSIS — I130 Hypertensive heart and chronic kidney disease with heart failure and stage 1 through stage 4 chronic kidney disease, or unspecified chronic kidney disease: Secondary | ICD-10-CM | POA: Diagnosis present

## 2017-11-02 DIAGNOSIS — J9621 Acute and chronic respiratory failure with hypoxia: Secondary | ICD-10-CM | POA: Diagnosis present

## 2017-11-02 DIAGNOSIS — E11621 Type 2 diabetes mellitus with foot ulcer: Secondary | ICD-10-CM | POA: Diagnosis present

## 2017-11-02 DIAGNOSIS — Z515 Encounter for palliative care: Secondary | ICD-10-CM | POA: Diagnosis not present

## 2017-11-02 DIAGNOSIS — Z79899 Other long term (current) drug therapy: Secondary | ICD-10-CM

## 2017-11-02 DIAGNOSIS — Z7189 Other specified counseling: Secondary | ICD-10-CM | POA: Diagnosis not present

## 2017-11-02 DIAGNOSIS — I480 Paroxysmal atrial fibrillation: Secondary | ICD-10-CM | POA: Diagnosis not present

## 2017-11-02 DIAGNOSIS — F329 Major depressive disorder, single episode, unspecified: Secondary | ICD-10-CM | POA: Diagnosis present

## 2017-11-02 DIAGNOSIS — E1122 Type 2 diabetes mellitus with diabetic chronic kidney disease: Secondary | ICD-10-CM | POA: Diagnosis present

## 2017-11-02 DIAGNOSIS — J9690 Respiratory failure, unspecified, unspecified whether with hypoxia or hypercapnia: Secondary | ICD-10-CM

## 2017-11-02 DIAGNOSIS — Z7984 Long term (current) use of oral hypoglycemic drugs: Secondary | ICD-10-CM

## 2017-11-02 DIAGNOSIS — J9622 Acute and chronic respiratory failure with hypercapnia: Secondary | ICD-10-CM | POA: Diagnosis present

## 2017-11-02 DIAGNOSIS — N183 Chronic kidney disease, stage 3 (moderate): Secondary | ICD-10-CM | POA: Diagnosis present

## 2017-11-02 DIAGNOSIS — L97429 Non-pressure chronic ulcer of left heel and midfoot with unspecified severity: Secondary | ICD-10-CM | POA: Diagnosis present

## 2017-11-02 DIAGNOSIS — E662 Morbid (severe) obesity with alveolar hypoventilation: Secondary | ICD-10-CM | POA: Diagnosis present

## 2017-11-02 DIAGNOSIS — I4819 Other persistent atrial fibrillation: Secondary | ICD-10-CM

## 2017-11-02 DIAGNOSIS — E8729 Other acidosis: Secondary | ICD-10-CM

## 2017-11-02 LAB — BRAIN NATRIURETIC PEPTIDE: B NATRIURETIC PEPTIDE 5: 1005 pg/mL — AB (ref 0.0–100.0)

## 2017-11-02 LAB — BLOOD GAS, ARTERIAL
Acid-Base Excess: 4.6 mmol/L — ABNORMAL HIGH (ref 0.0–2.0)
Acid-Base Excess: 6.1 mmol/L — ABNORMAL HIGH (ref 0.0–2.0)
BICARBONATE: 34.3 mmol/L — AB (ref 20.0–28.0)
Bicarbonate: 33.7 mmol/L — ABNORMAL HIGH (ref 20.0–28.0)
DELIVERY SYSTEMS: POSITIVE
Delivery systems: POSITIVE
EXPIRATORY PAP: 8
Expiratory PAP: 8
FIO2: 0.3
FIO2: 0.3
INSPIRATORY PAP: 18
INSPIRATORY PAP: 22
O2 SAT: 95.5 %
O2 Saturation: 93.4 %
PCO2 ART: 65 mmHg — AB (ref 32.0–48.0)
PO2 ART: 76 mmHg — AB (ref 83.0–108.0)
PO2 ART: 84 mmHg (ref 83.0–108.0)
Patient temperature: 37
Patient temperature: 37
pCO2 arterial: 70 mmHg (ref 32.0–48.0)
pH, Arterial: 7.29 — ABNORMAL LOW (ref 7.350–7.450)
pH, Arterial: 7.33 — ABNORMAL LOW (ref 7.350–7.450)

## 2017-11-02 LAB — CBC WITH DIFFERENTIAL/PLATELET
BASOS PCT: 1 %
Basophils Absolute: 0 10*3/uL (ref 0–0.1)
EOS ABS: 0.1 10*3/uL (ref 0–0.7)
Eosinophils Relative: 1 %
HCT: 44.4 % (ref 35.0–47.0)
HEMOGLOBIN: 14.1 g/dL (ref 12.0–16.0)
Lymphocytes Relative: 11 %
Lymphs Abs: 1 10*3/uL (ref 1.0–3.6)
MCH: 27.9 pg (ref 26.0–34.0)
MCHC: 31.7 g/dL — AB (ref 32.0–36.0)
MCV: 87.8 fL (ref 80.0–100.0)
Monocytes Absolute: 0.5 10*3/uL (ref 0.2–0.9)
Monocytes Relative: 6 %
NEUTROS PCT: 81 %
Neutro Abs: 7.3 10*3/uL — ABNORMAL HIGH (ref 1.4–6.5)
PLATELETS: 175 10*3/uL (ref 150–440)
RBC: 5.06 MIL/uL (ref 3.80–5.20)
RDW: 18.4 % — ABNORMAL HIGH (ref 11.5–14.5)
WBC: 9 10*3/uL (ref 3.6–11.0)

## 2017-11-02 LAB — BLOOD GAS, VENOUS
ACID-BASE EXCESS: 4.8 mmol/L — AB (ref 0.0–2.0)
ACID-BASE EXCESS: 3.9 mmol/L — AB (ref 0.0–2.0)
BICARBONATE: 36.2 mmol/L — AB (ref 20.0–28.0)
Bicarbonate: 36.4 mmol/L — ABNORMAL HIGH (ref 20.0–28.0)
Delivery systems: POSITIVE
FIO2: 0.21
FIO2: 0.4
O2 SAT: 67.2 %
O2 Saturation: 58.5 %
PATIENT TEMPERATURE: 37
PCO2 VEN: 97 mmHg — AB (ref 44.0–60.0)
PH VEN: 7.18 — AB (ref 7.250–7.430)
Patient temperature: 37
pCO2, Ven: 91 mmHg (ref 44.0–60.0)
pH, Ven: 7.21 — ABNORMAL LOW (ref 7.250–7.430)
pO2, Ven: 43 mmHg (ref 32.0–45.0)
pO2, Ven: 39 mmHg (ref 32.0–45.0)

## 2017-11-02 LAB — PROTIME-INR
INR: 1.22
PROTHROMBIN TIME: 15.3 s — AB (ref 11.4–15.2)

## 2017-11-02 LAB — MRSA PCR SCREENING: MRSA by PCR: NEGATIVE

## 2017-11-02 LAB — COMPREHENSIVE METABOLIC PANEL
ALT: 11 U/L — AB (ref 14–54)
AST: 17 U/L (ref 15–41)
Albumin: 3.8 g/dL (ref 3.5–5.0)
Alkaline Phosphatase: 52 U/L (ref 38–126)
Anion gap: 7 (ref 5–15)
BUN: 32 mg/dL — ABNORMAL HIGH (ref 6–20)
CHLORIDE: 97 mmol/L — AB (ref 101–111)
CO2: 33 mmol/L — AB (ref 22–32)
CREATININE: 1.63 mg/dL — AB (ref 0.44–1.00)
Calcium: 8.9 mg/dL (ref 8.9–10.3)
GFR calc non Af Amer: 32 mL/min — ABNORMAL LOW (ref >60)
GFR, EST AFRICAN AMERICAN: 37 mL/min — AB (ref >60)
Glucose, Bld: 162 mg/dL — ABNORMAL HIGH (ref 65–99)
POTASSIUM: 4.3 mmol/L (ref 3.5–5.1)
SODIUM: 137 mmol/L (ref 135–145)
Total Bilirubin: 0.7 mg/dL (ref 0.3–1.2)
Total Protein: 7.1 g/dL (ref 6.5–8.1)

## 2017-11-02 LAB — GLUCOSE, CAPILLARY
GLUCOSE-CAPILLARY: 133 mg/dL — AB (ref 65–99)
Glucose-Capillary: 98 mg/dL (ref 65–99)
Glucose-Capillary: 74 mg/dL (ref 65–99)

## 2017-11-02 LAB — LACTIC ACID, PLASMA: Lactic Acid, Venous: 1.1 mmol/L (ref 0.5–1.9)

## 2017-11-02 LAB — TROPONIN I: Troponin I: <0.03 ng/mL (ref <0.03)

## 2017-11-02 MED ORDER — IOPAMIDOL (ISOVUE-370) INJECTION 76%
60.0000 mL | Freq: Once | INTRAVENOUS | Status: AC | PRN
  Administered 2017-11-02: 14:00:00 via INTRAVENOUS

## 2017-11-02 MED ORDER — HEPARIN SODIUM (PORCINE) 5000 UNIT/ML IJ SOLN
5000.0000 [IU] | Freq: Three times a day (TID) | INTRAMUSCULAR | Status: DC
  Administered 2017-11-02 – 2017-11-03 (×3): 5000 [IU] via SUBCUTANEOUS
  Filled 2017-11-02 (×3): qty 1

## 2017-11-02 MED ORDER — INSULIN ASPART 100 UNIT/ML ~~LOC~~ SOLN: 0.0000 [IU] | Freq: Every day | SUBCUTANEOUS | Status: DC

## 2017-11-02 MED ORDER — AMLODIPINE BESYLATE 5 MG PO TABS
5.0000 mg | ORAL_TABLET | Freq: Every day | ORAL | Status: DC
  Administered 2017-11-03: 5 mg via ORAL
  Filled 2017-11-02: qty 1

## 2017-11-02 MED ORDER — RISPERIDONE 1 MG PO TABS
1.5000 mg | ORAL_TABLET | Freq: Every day | ORAL | Status: DC
  Filled 2017-11-02: qty 1

## 2017-11-02 MED ORDER — MIRTAZAPINE 15 MG PO TABS
30.0000 mg | ORAL_TABLET | Freq: Every day | ORAL | Status: DC
  Administered 2017-11-03 – 2017-11-07 (×5): 30 mg via ORAL
  Filled 2017-11-02 (×5): qty 2

## 2017-11-02 MED ORDER — POTASSIUM CHLORIDE CRYS ER 20 MEQ PO TBCR
20.0000 meq | EXTENDED_RELEASE_TABLET | Freq: Every day | ORAL | Status: DC
  Administered 2017-11-03: 20 meq via ORAL
  Filled 2017-11-02: qty 1
  Filled 2017-11-02 (×2): qty 2

## 2017-11-02 MED ORDER — TORSEMIDE 20 MG PO TABS
20.0000 mg | ORAL_TABLET | Freq: Every day | ORAL | Status: DC
  Administered 2017-11-03: 20 mg via ORAL
  Filled 2017-11-02: qty 1

## 2017-11-02 MED ORDER — CHLORHEXIDINE GLUCONATE 0.12 % MT SOLN
15.0000 mL | Freq: Two times a day (BID) | OROMUCOSAL | Status: DC
  Administered 2017-11-02 – 2017-11-08 (×10): 15 mL via OROMUCOSAL
  Filled 2017-11-02 (×9): qty 15

## 2017-11-02 MED ORDER — ONDANSETRON HCL 4 MG/2ML IJ SOLN: 4.0000 mg | Freq: Four times a day (QID) | INTRAMUSCULAR | Status: DC | PRN

## 2017-11-02 MED ORDER — MEGESTROL ACETATE 20 MG PO TABS
80.0000 mg | ORAL_TABLET | Freq: Two times a day (BID) | ORAL | Status: DC
Start: 2017-11-02 — End: 2017-11-03
  Filled 2017-11-02: qty 4
  Filled 2017-11-02: qty 2

## 2017-11-02 MED ORDER — FENOFIBRATE 160 MG PO TABS
160.0000 mg | ORAL_TABLET | Freq: Every day | ORAL | Status: DC
  Administered 2017-11-03 – 2017-11-08 (×6): 160 mg via ORAL
  Filled 2017-11-02 (×6): qty 1

## 2017-11-02 MED ORDER — ASPIRIN 81 MG PO CHEW
81.0000 mg | CHEWABLE_TABLET | Freq: Every day | ORAL | Status: DC
  Administered 2017-11-03: 81 mg via ORAL
  Filled 2017-11-02: qty 1

## 2017-11-02 MED ORDER — ORAL CARE MOUTH RINSE
15.0000 mL | Freq: Two times a day (BID) | OROMUCOSAL | Status: DC
  Administered 2017-11-02 – 2017-11-07 (×6): 15 mL via OROMUCOSAL

## 2017-11-02 MED ORDER — LOSARTAN POTASSIUM 50 MG PO TABS
50.0000 mg | ORAL_TABLET | Freq: Every day | ORAL | Status: DC
  Administered 2017-11-03: 50 mg via ORAL
  Filled 2017-11-02: qty 1

## 2017-11-02 MED ORDER — INSULIN ASPART 100 UNIT/ML ~~LOC~~ SOLN
0.0000 [IU] | Freq: Three times a day (TID) | SUBCUTANEOUS | Status: DC
Start: 2017-11-02 — End: 2017-11-08
  Administered 2017-11-04: 2 [IU] via SUBCUTANEOUS
  Administered 2017-11-05 (×2): 1 [IU] via SUBCUTANEOUS
  Administered 2017-11-07: 2 [IU] via SUBCUTANEOUS
  Administered 2017-11-08: 1 [IU] via SUBCUTANEOUS
  Filled 2017-11-02 (×4): qty 1

## 2017-11-02 MED ORDER — LATANOPROST 0.005 % OP SOLN
1.0000 [drp] | Freq: Every day | OPHTHALMIC | Status: DC
  Administered 2017-11-02 – 2017-11-07 (×4): 1 [drp] via OPHTHALMIC
  Filled 2017-11-02 (×2): qty 2.5

## 2017-11-02 MED ORDER — ONDANSETRON HCL 4 MG PO TABS: 4.0000 mg | ORAL_TABLET | Freq: Four times a day (QID) | ORAL | Status: DC | PRN

## 2017-11-02 MED ORDER — ACETAMINOPHEN 650 MG RE SUPP: 650.0000 mg | Freq: Four times a day (QID) | RECTAL | Status: DC | PRN

## 2017-11-02 MED ORDER — METOPROLOL SUCCINATE ER 50 MG PO TB24
50.0000 mg | ORAL_TABLET | Freq: Every day | ORAL | Status: DC
  Administered 2017-11-03 – 2017-11-08 (×6): 50 mg via ORAL
  Filled 2017-11-02 (×6): qty 1

## 2017-11-02 MED ORDER — ACETAMINOPHEN 325 MG PO TABS: 650.0000 mg | ORAL_TABLET | Freq: Four times a day (QID) | ORAL | Status: DC | PRN

## 2017-11-02 NOTE — Progress Notes (Signed)
Pt has significant moisture-associated damage under her abdominal folds, the folds of bilateral groin, the folds of her vagina, and the fold of her buttock.  Area cleaned and ant-fungal powder applied.

## 2017-11-02 NOTE — ED Notes (Signed)
Admitting MD at bedside.

## 2017-11-02 NOTE — ED Notes (Signed)
Patient assisted onto bedpan.  Unsuccessful urination attempt.  MD notified, states not to I&O cath.

## 2017-11-02 NOTE — ED Triage Notes (Signed)
Pt comes into the ED via POV with her husband c/o low oxygen saturation at home in the 60's on room air.  Patient denies feeling short of breath, chest pain, dizziness, or increased confusion.  Patient denies any COPD, asthma, or CHF.  Patient has been treated for a recent UTI with antibiotics of cefdinir.  Patient saturation was 63 room air upon arrival.  Patient placed on non-re breather at 15L and it tolerating well at this time.

## 2017-11-02 NOTE — Consult Note (Addendum)
[facility]    ASSESSMENT/PLAN   Acute on chronic hypercapnic respiratory failure. Reviewed chest CT scan along with arterial blood gases and serum bicarbonate. 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 A component of cardiac dysfunction with right pleural effusion and basilar atelectasis. 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 Patient will need outpatient polysomnogram for diagnosis and titration, however may be a candidate to go home with BiPAP pending sleep study. At this point would place in the intensive care unit, albuterol, Atrovent, diuresis, empiric broad-spectrum antibiotics, will check echocardiography to assess LV and RV function, BNP, Doppler lower extremities, EKG revealed anterior hemiblock, with nonspecific ST-T wave changes. Would obtain serial cardiac enzymes and EKG in addition.   Name: Jasmine DustmanDarlene O Osborn MRN: [id number] DOB: 03/27/1951    ADMISSION DATE:  11/02/2017 CONSULTATION DATE: 11/02/2017  REFERRING MD : Hospitalist Service  CHIEF COMPLAINT:  Low oxygen levels   HISTORY OF PRESENT ILLNESS:  Mrs. Jasmine Osborn is a 67 year old Caucasian female with a past medical history remarkable for hypertension, hyperlipidemia, diabetes, depression, peripheral venous disease, chronic respiratory failure has been intubated in the past, presented to the emergency department secondary to husband stating he could get her oxygen level up over the past 2 days. She was recently diagnosed with urinary tract infection 2 days prior. Upon arrival into the emergency room her respiratory status has deteriorated, arterial blood gas showed acute on chronic hypercapnic respiratory failure, she was started on noninvasive ventilation. Presently she is somnolent  but arousable on BiPAP.  PAST MEDICAL HISTORY :  Past Medical History:  Diagnosis Date  . Depression   . Diabetes mellitus without complication (HCC)   . Elevated lipids   . Hernia of abdominal wall   . Hypertension   . Increased endometrial stripe thickness 06/04/2017  . Increased endometrial stripe thickness 06/04/2017  . Psychosis (HCC)   . PVD (peripheral vascular disease) (HCC)    heel ulcer Lt    Past Surgical History:  Procedure Laterality Date  . COLON SURGERY    . DILATION AND CURETTAGE OF UTERUS    . HERNIA REPAIR    . TIBIA FRACTURE SURGERY Left    Prior to Admission medications   Medication Sig Start Date End Date Taking? Authorizing Provider  amLODipine (NORVASC) 5 MG tablet Take 5 mg by mouth daily.   Yes [provider]  aspirin 81 MG tablet Take 81 mg by mouth daily.   Yes [provider]  cefdinir (OMNICEF) 300 MG capsule Take 300 mg by mouth 2 (two) times daily. 10/31/17 11/06/17 Yes [provider]  fenofibrate 160 MG tablet Take 160 mg by mouth daily.   Yes [provider]  KLOR-CON 10 10 MEQ tablet Take 20 mEq by mouth daily.  10/22/16  Yes [provider]  latanoprost (XALATAN) 0.005 % [facility]mic solution [facility] 05/18/15  Yes [provider]  losartan (COZAAR) 50 MG tablet Take 50 mg by mouth daily.   Yes [provider]  megestrol (MEGACE) 40 MG tablet Take 80 mg by mouth 2 (two) times daily. 12/11/16  Yes [provider]  metFORMIN (GLUCOPHAGE) 500 MG tablet Take 500 mg by mouth 2 (two) times daily with a meal.   Yes [provider]  metoprolol succinate (TOPROL-XL) 50 MG 24 hr tablet Take 50 mg by mouth  daily. 09/04/17  Yes [provider]  mirtazapine (REMERON) 30 MG tablet Take 30 mg by mouth at bedtime.   Yes [provider]  risperiDONE (RISPERDAL) 1 MG tablet Take 1 tablet (1 mg total) by mouth at bedtime. Patient taking differently:  Take 1.5 mg by mouth at bedtime.  01/03/17  Yes Enid Baas, MD  torsemide (DEMADEX) 20 MG tablet Take 20 mg by mouth daily.   Yes [provider]  metoprolol (LOPRESSOR) 50 MG tablet Take 1 tablet (50 mg total) by mouth 2 (two) times daily. Patient not taking: Reported on 11/02/2017 01/03/17   Enid Baas, MD   No Known Allergies  FAMILY HISTORY:  Family History  Problem Relation Age of Onset  . Dementia Mother   . Valvular heart disease Father    SOCIAL HISTORY:  reports that she has quit smoking. she has never used smokeless tobacco. She reports that she does not drink alcohol or use drugs.  REVIEW OF SYSTEMS:   Unable to obtain    VITAL SIGNS: Temp:  [98.2 F (36.8 C)] 98.2 F (36.8 C) (01/27 1137) Pulse Rate:  [88-117] 88 (01/27 1545) Resp:  [16-37] 30 (01/27 1545) BP: (93-151)/(50-94) 138/67 (01/27 1545) SpO2:  [63 %-100 %] 96 % (01/27 1545)   INTAKE / OUTPUT: No intake or output data in the 24 hours ending 11/02/17 1610  Physical Examination:   VS: BP 138/67   Pulse 88   Temp 98.2 F (36.8 C) (Oral)   Resp (!) 30   SpO2 96%   General Appearance: No distress, resting comfortably on BiPAP, somnolent but easily arousable Neuro:without focal findings, limited exam at this time HEENT: PERRLA, EOM intact, no ptosis, no other lesions noticed;  Pulmonary: Diminished breath sounds, basilar crackles appreciated, shallow respiration CardiovascularNormal S1,S2.  No m/r/g.    Abdomen: Benign, Soft, non-tender, No masses, hepatosplenomegaly, No lymphadenopathy Extremities: Patient with significant lower extremity edema, presently Ace wrap  LABS: Reviewed   LABORATORY PANEL:   CBC Recent Labs  Lab 11/02/17 1204  WBC 9.0  HGB 14.1  HCT 44.4  PLT 175    Chemistries  Recent Labs  Lab 11/02/17 1204  NA 137  K 4.3  CL 97*  CO2 33*  GLUCOSE 162*  BUN 32*  CREATININE 1.63*  CALCIUM 8.9  AST 17  ALT 11*  ALKPHOS 52  BILITOT 0.7     No results for input(s): GLUCAP in the last 168 hours. No results for input(s): PHART, PCO2ART, PO2ART in the last 168 hours. Recent Labs  Lab 11/02/17 1204  AST 17  ALT 11*  ALKPHOS 52  BILITOT 0.7  ALBUMIN 3.8    Cardiac Enzymes Recent Labs  Lab 11/02/17 1204  TROPONINI <0.03    RADIOLOGY:  Ct Angio Chest Pe W And/or Wo Contrast  Result Date: 11/02/2017 CLINICAL DATA:  Low oxygen saturation at home, on BiPAP, history diabetes mellitus, hypertension, former smoker, high pretest probability of pulmonary embolism EXAM: CT ANGIOGRAPHY CHEST WITH CONTRAST TECHNIQUE: Multidetector CT imaging of the chest was performed using the standard protocol during bolus administration of intravenous contrast. Multiplanar CT image reconstructions and MIPs were obtained to evaluate the vascular anatomy. CONTRAST:  60 mL ISOVUE-370 IOPAMIDOL (ISOVUE-370) INJECTION 76% IV COMPARISON:  None FINDINGS: Cardiovascular: Atherosclerotic calcifications aorta and coronary arteries. Aorta normal caliber without aneurysm or dissection. No pericardial effusion. Mild enlargement of cardiac chambers. Pulmonary arteries adequately opacified and patent. No evidence of pulmonary embolism. Mediastinum/Nodes: Mild mediastinal lipomatosis. Scattered normal size  mediastinal lymph nodes. No thoracic adenopathy. Esophagus unremarkable. Base of cervical region normal appearance. Lungs/Pleura: Small RIGHT pleural effusion. Atelectasis in both lower lobes greater on RIGHT. Atelectasis at base of RIGHT middle lobe. No definite acute infiltrate, LEFT pleural effusion or pneumothorax. Upper Abdomen: Ascites in upper abdomen. Enlarged LEFT lobe and caudate lobe of liver with smaller RIGHT lobe suggesting cirrhosis. Remaining visualized upper abdomen unremarkable. Musculoskeletal: Mild degenerative disc disease changes of the lower thoracic spine. Review of the MIP images confirms the above findings. IMPRESSION: No evidence pulmonary  embolism. Small RIGHT pleural effusion with bibasilar atelectasis greater in RIGHT lower lobe. Small amount of ascites in upper abdomen with altered hepatic morphology raising question of cirrhosis. Aortic Atherosclerosis (ICD10-I70.0). Electronically Signed   By: Ulyses Southward M.D.   On: 11/02/2017 14:33   Dg Chest Port 1 View  Result Date: 11/02/2017 CLINICAL DATA:  Shortness of breath. EXAM: PORTABLE CHEST 1 VIEW COMPARISON:  12/31/2016 FINDINGS: 1203 hours. Patient is rotated to the left. Markedly low lung volumes with asymmetric elevation right hemidiaphragm. The cardio pericardial silhouette is enlarged. Abnormal soft tissue fullness left suprahilar region. Bibasilar collapse/consolidation, right greater than left with probable right pleural effusion. Bones are diffusely demineralized. Telemetry leads overlie the chest. IMPRESSION: Cardiomegaly with vascular congestion and bibasilar collapse/consolidation with small right pleural effusion. Abnormal cardiomediastinal contour with left suprahilar fullness may be related to patient rotation. Dedicated upright PA and lateral chest x-ray, 1 able, would likely prove helpful to further evaluate. If the patient is unable to stand, CT imaging of the chest could be used to exclude central lung mass/mediastinal lymphadenopathy. Electronically Signed   By: Kennith Center M.D.   On: 11/02/2017 12:40     Tora Kindred, DO  11/02/2017, 4:10 PM

## 2017-11-02 NOTE — ED Provider Notes (Addendum)
[facility] Emergency Department Provider Note  ____________________________________________   I have reviewed the triage vital signs and the nursing notes. Where available I have reviewed prior notes and, if possible and indicated, outside hospital notes.    HISTORY  Chief Complaint Shortness of Breath    HPI Jasmine Osborn is a 67 y.o. female who presents today complaining of feeling somewhat tired.  Patient is a very poor historian, is at her baseline intellectually according to family, patient was diagnosed with a UTI 2 days ago at [facility] clinic, but family has a home pulse ox machine and they thought that they would check her oxygen and her oxygen saturation was 80 yesterday and 60 today.  However there is been no cough no shortness of breath she just feels somewhat she states generally weak.  Pulse ox reading that was documented at the office visit at the clinic was 98% 2 days ago.  She has had no cough, no fever.  She has been feeling generally weak because of the urinary tract infection for which she is taking Ceftin .  Has chronic low extremity-edema but no history of PE or DVT, they have not noticed any change in her lower extremity swelling, essentially she has a UTI, she has been fatigued and her sats of been reading low.  She came to triage here with a found sats in the 60s put on oxygen and she immediately came up to 100%,   Past Medical History:  Diagnosis Date  . Depression   . Diabetes mellitus without complication (HCC)   . Elevated lipids   . Hernia of abdominal wall   . Hypertension   . Increased endometrial stripe thickness 06/04/2017  . Increased endometrial stripe thickness 06/04/2017  . Psychosis (HCC)   . PVD (peripheral vascular disease) (HCC)    heel ulcer Lt     Patient Active Problem List   Diagnosis Date Noted  . Increased endometrial stripe thickness 06/04/2017  . Pressure injury of skin 12/23/2016  . Respiratory  failure with hypercapnia (HCC) 12/22/2016    Past Surgical History:  Procedure Laterality Date  . COLON SURGERY    . DILATION AND CURETTAGE OF UTERUS    . HERNIA REPAIR    . TIBIA FRACTURE SURGERY Left     Prior to Admission medications   Medication Sig Start Date End Date Taking? Authorizing Provider  amLODipine (NORVASC) 5 MG tablet Take 5 mg by mouth daily.   Yes [provider]  aspirin 81 MG tablet Take 81 mg by mouth daily.   Yes [provider]  cefdinir (OMNICEF) 300 MG capsule Take 300 mg by mouth 2 (two) times daily. 10/31/17 11/06/17 Yes [provider]  fenofibrate 160 MG tablet Take 160 mg by mouth daily.   Yes [provider]  KLOR-CON 10 10 MEQ tablet Take 20 mEq by mouth daily.  10/22/16  Yes [provider]  latanoprost (XALATAN) 0.005 % ophthalmic solution USE 1 DROP IN [facility] EYE AT BEDTIME 05/18/15  Yes [provider]  losartan (COZAAR) 50 MG tablet Take 50 mg by mouth daily.   Yes [provider]  megestrol (MEGACE) 40 MG tablet Take 80 mg by mouth 2 (two) times daily. 12/11/16  Yes [provider]  metFORMIN (GLUCOPHAGE) 500 MG tablet Take 500 mg by mouth 2 (two) times daily with a meal.   Yes [provider]  metoprolol succinate (TOPROL-XL) 50 MG 24 hr tablet Take 50 mg by  mouth daily. 09/04/17  Yes [provider]  mirtazapine (REMERON) 30 MG tablet Take 30 mg by mouth at bedtime.   Yes [provider]  risperiDONE (RISPERDAL) 1 MG tablet Take 1 tablet (1 mg total) by mouth at bedtime. Patient taking differently: Take 1.5 mg by mouth at bedtime.  01/03/17  Yes Enid Baas, MD  torsemide (DEMADEX) 20 MG tablet Take 20 mg by mouth daily.   Yes [provider]  metoprolol (LOPRESSOR) 50 MG tablet Take 1 tablet (50 mg total) by mouth 2 (two) times daily. Patient not taking: Reported on 11/02/2017 01/03/17   Enid Baas, MD    Allergies Patient has no  known allergies.  Family History  Problem Relation Age of Onset  . Valvular heart disease Father     Social History Social History   Tobacco Use  . Smoking status: Former Games developer  . Smokeless tobacco: Never Used  Substance Use Topics  . Alcohol use: No  . Drug use: No    Review of Systems \\Constitutional : No fever/chills Eyes: No visual changes. ENT: No sore throat. No stiff neck no neck pain Cardiovascular: Denies chest pain. Respiratory: Denies shortness of breath. Gastrointestinal:   no vomiting.  No diarrhea.  No constipation. Genitourinary: Negative for dysuria. Musculoskeletal: Negative lower extremity swelling Skin: Negative for rash. Neurological: Negative for severe headaches, focal weakness or numbness.   ____________________________________________   PHYSICAL EXAM:  VITAL SIGNS: ED Triage Vitals [11/02/17 1137]  Enc Vitals Group     BP 127/66     Pulse Rate (!) 114     Resp 16     Temp 98.2 F (36.8 C)     Temp Source Oral     SpO2 (!) 63 %     Weight      Height      Head Circumference      Peak Flow      Pain Score      Pain Loc      Pain Edu?      Excl. in GC?     Constitutional: Somewhat somnolent but no acute medical distress, she is very poorly kempt, morbidly obese and does appear to be chronically ill at baseline. Eyes: Conjunctivae are normal Head: Atraumatic HEENT: No congestion/rhinnorhea. Mucous membranes are moist.  Oropharynx non-erythematous Neck:   Nontender with no meningismus, no masses, no stridor very kyphotic Cardiovascular: Normal rate, regular rhythm. Grossly normal heart sounds.  Good peripheral circulation. Respiratory: Shallow respirations, but no evidence of significant wheezes rales or rhonchi however very diminished in the bases, poor respiratory effort Abdominal: Soft and nontender. No distention. No guarding no rebound the obese Back:  There is no focal tenderness or step off.  there is no midline tenderness  there are no lesions noted. there is no CVA tenderness Musculoskeletal: No lower extremity tenderness, no upper extremity tenderness. No joint effusions, no DVT signs strong distal pulses bilateral symmetric edema noted Neurologic:  Normal speech and language. No gross focal neurologic deficits are appreciated.  Skin:  Skin is warm, dry and intact. No rash noted.  ____________________________________________   LABS (all labs ordered are listed, but only abnormal results are displayed)  Labs Reviewed  CBC WITH DIFFERENTIAL/PLATELET - Abnormal; Notable for the following components:      Result Value   MCHC 31.7 (*)    RDW 18.4 (*)    Neutro Abs 7.3 (*)    All other components within normal limits  BLOOD GAS, VENOUS - Abnormal;  Notable for the following components:   pH, Ven 7.21 (*)    pCO2, Ven 91 (*)    Bicarbonate 36.4 (*)    Acid-Base Excess 4.8 (*)    All other components within normal limits  PROTIME-INR - Abnormal; Notable for the following components:   Prothrombin Time 15.3 (*)    All other components within normal limits  CULTURE, BLOOD (ROUTINE X 2)  CULTURE, BLOOD (ROUTINE X 2)  LACTIC ACID, PLASMA  TROPONIN I  BRAIN NATRIURETIC PEPTIDE  COMPREHENSIVE METABOLIC PANEL  URINALYSIS, COMPLETE (UACMP) WITH MICROSCOPIC  CBG MONITORING, ED    Pertinent labs  results that were available during my care of the patient were reviewed by me and considered in my medical decision making (see chart for details). ____________________________________________  EKG  I personally interpreted any EKGs ordered by me or triage Sinus tach rate 108 no acute ST elevation nonspecific ST changes, normal borderline LAD, LAFB noted, ____________________________________________  RADIOLOGY  Pertinent labs & imaging results that were available during my care of the patient were reviewed by me and considered in my medical decision making (see chart for details). If possible, patient and/or  family made aware of any abnormal findings.  Dg Chest Port 1 View  Result Date: 11/02/2017 CLINICAL DATA:  Shortness of breath. EXAM: PORTABLE CHEST 1 VIEW COMPARISON:  12/31/2016 FINDINGS: 1203 hours. Patient is rotated to the left. Markedly low lung volumes with asymmetric elevation right hemidiaphragm. The cardio pericardial silhouette is enlarged. Abnormal soft tissue fullness left suprahilar region. Bibasilar collapse/consolidation, right greater than left with probable right pleural effusion. Bones are diffusely demineralized. Telemetry leads overlie the chest. IMPRESSION: Cardiomegaly with vascular congestion and bibasilar collapse/consolidation with small right pleural effusion. Abnormal cardiomediastinal contour with left suprahilar fullness may be related to patient rotation. Dedicated upright PA and lateral chest x-ray, 1 able, would likely prove helpful to further evaluate. If the patient is unable to stand, CT imaging of the chest could be used to exclude central lung mass/mediastinal lymphadenopathy. Electronically Signed   By: Kennith CenterEric  Mansell M.D.   On: 11/02/2017 12:40   ____________________________________________    PROCEDURES  Procedure(s) performed: None  Procedures  Critical Care performed: CRITICAL CARE Performed by: Jeanmarie PlantJAMES A MCSHANE   Total critical care time: 42 minutes  Critical care time was exclusive of separately billable procedures and treating other patients.  Critical care was necessary to treat or prevent imminent or life-threatening deterioration.  Critical care was time spent personally by me on the following activities: development of treatment plan with patient and/or surrogate as well as nursing, discussions with consultants, evaluation of patient's response to treatment, examination of patient, obtaining history from patient or surrogate, ordering and performing treatments and interventions, ordering and review of laboratory studies, ordering and review of  radiographic studies, pulse oximetry and re-evaluation of patient's condition.   ____________________________________________   INITIAL IMPRESSION / ASSESSMENT AND PLAN / ED COURSE  Pertinent labs & imaging results that were available during my care of the patient were reviewed by me and considered in my medical decision making (see chart for details).  She is here with some degree of somnolence and low sats, family states she had baseline does not take deep breaths , although it is unclear what that exactly signifies, her VBG shows acute CO2 retention with a pH of 7.21 and a PCO2 of 91, she does have some degree of compensation from this metabolically which suggest that this is an acute on chronic condition likely I  think probably secondary to patient's central obesity and body habitus if nothing else.  In any event, we are searching for other causes of this decompensation, patient is being treated for UTI, she does not appear to be septic here, we are placing her on BiPAP, to see if we can change her CO2, and improve her pH, patient does have a borderline creatinine, she may ultimately require CT scan, we will see if that can be safely done. Chest x-ray shows no definitive pneumonia although is very difficult to interpret by my read and by the read radiology, lactic acid is reassuring, white cell count is reassuring, she is afebrile no evidence of acute decompensated infection causing this.  ----------------------------------------- 2:01 PM on 11/02/2017 -----------------------------------------  Doing very well on BiPAP wide awake, we will obtain CT to rule out PE although BNP is quite elevated this certainly could be decompensated CHF  ----------------------------------------- 3:34 PM on 11/02/2017 -----------------------------------------  Patient's numbers are not markedly improving however she is more alert clinically I do not think she requires intubation at this time, her urinary  tract infection from prior visit did not show any acute bacteriologic pathology there for culture results, we will forego antibiotics for that reason CT scan does not show any pulmonary embolism or pneumonia, patient does have a pickwickian body habitus and is unknown at baseline to take "shallow breaths".  We will see if adjustments to the BiPAP which respiratory therapist and I are implementing will help improve the profile of her blood work    ____________________________________________   FINAL CLINICAL IMPRESSION(S) / ED DIAGNOSES  Final diagnoses:  SOB (shortness of breath)      This chart was dictated using voice recognition software.  Despite best efforts to proofread,  errors can occur which can change meaning.      Jeanmarie Plant, MD 11/02/17 1258    Jeanmarie Plant, MD 11/02/17 1401    Jeanmarie Plant, MD 11/02/17 1515    Jeanmarie Plant, MD 11/02/17 1535

## 2017-11-02 NOTE — H&P (Signed)
[facility] [place] [facility]   PATIENT NAME: Jasmine Osborn    MR#:  [id number]  DATE OF BIRTH:  [birth year]  DATE OF ADMISSION:  11/02/2017  PRIMARY CARE PHYSICIAN: Lauro Regulus, MD   REQUESTING/REFERRING PHYSICIAN: Dr. Silverio Lay  CHIEF COMPLAINT:   Chief Complaint  Patient presents with  . Shortness of Breath    HISTORY OF PRESENT ILLNESS:  Jasmine Osborn  is a 67 y.o. female with a known history of obesity, peripheral vascular disease, hypertension, hyperlipidemia, diabetes, abdominal wall hernia, suspected sleep apnea who presents to the hospital due to lethargy and low O2 sats. Patient was seen by her primary care physician 2 days ago and diagnosed with a urinary tract infection and started on oral antibiotics with Omnicef. The husband noticed that over the past day or so patient's O2 sats at home have been low in the low to mid 80s. Patient has a home pulse oximeter which he bought when the patient was hospitalized this past March and she was intubated at that time. Because her O2 sats were not improving he brought her to the ER for further evaluation. Emergency room patient was noted to be in acute on chronic respiratory failure with hypoxemia/hypercapnia and placed on BiPAP. Hospitalist services were contacted further treatment and evaluation. Patient denies any shortness of breath, chest pain, nausea, vomiting, abdominal pain, fever, chills, cough or any other associated symptoms presently.  PAST MEDICAL HISTORY:   Past Medical History:  Diagnosis Date  . Depression   . Diabetes mellitus without complication (HCC)   . Elevated lipids   . Hernia of abdominal wall   . Hypertension   . Increased endometrial stripe thickness 06/04/2017  . Increased endometrial stripe thickness 06/04/2017  . Psychosis (HCC)   . PVD (peripheral vascular disease) (HCC)    heel ulcer Lt     PAST SURGICAL HISTORY:   Past Surgical History:  Procedure  Laterality Date  . COLON SURGERY    . DILATION AND CURETTAGE OF UTERUS    . HERNIA REPAIR    . TIBIA FRACTURE SURGERY Left     SOCIAL HISTORY:   Social History   Tobacco Use  . Smoking status: Former Games developer  . Smokeless tobacco: Never Used  Substance Use Topics  . Alcohol use: No    FAMILY HISTORY:   Family History  Problem Relation Age of Onset  . Dementia Mother   . Valvular heart disease Father     DRUG ALLERGIES:  No Known Allergies  REVIEW OF SYSTEMS:   Review of Systems  Constitutional: Negative for fever and weight loss.  HENT: Negative for congestion, nosebleeds and tinnitus.   Eyes: Negative for blurred vision, double vision and redness.  Respiratory: Negative for cough, hemoptysis and shortness of breath.   Cardiovascular: Negative for chest pain, orthopnea, leg swelling and PND.  Gastrointestinal: Negative for abdominal pain, diarrhea, melena, nausea and vomiting.  Genitourinary: Negative for dysuria, hematuria and urgency.  Musculoskeletal: Negative for falls and joint pain.  Neurological: Positive for weakness. Negative for dizziness, tingling, sensory change, focal weakness, seizures and headaches.  Endo/Heme/Allergies: Negative for polydipsia. Does not bruise/bleed easily.  Psychiatric/Behavioral: Negative for depression and memory loss. The patient is not nervous/anxious.     MEDICATIONS AT HOME:   Prior to Admission medications   Medication Sig Start Date End Date Taking? Authorizing Provider  amLODipine (NORVASC) 5 MG tablet Take 5 mg by mouth daily.   Yes [provider]  aspirin  81 MG tablet Take 81 mg by mouth daily.   Yes [provider]  cefdinir (OMNICEF) 300 MG capsule Take 300 mg by mouth 2 (two) times daily. 10/31/17 11/06/17 Yes [provider]  fenofibrate 160 MG tablet Take 160 mg by mouth daily.   Yes [provider]  KLOR-CON 10 10 MEQ tablet Take 20 mEq by mouth daily.  10/22/16  Yes [provider]  latanoprost (XALATAN) 0.005 % ophthalmic solution USE 1 DROP IN [facility] AT BEDTIME 05/18/15  Yes [provider]  losartan (COZAAR) 50 MG tablet Take 50 mg by mouth daily.   Yes [provider]  megestrol (MEGACE) 40 MG tablet Take 80 mg by mouth 2 (two) times daily. 12/11/16  Yes [provider]  metFORMIN (GLUCOPHAGE) 500 MG tablet Take 500 mg by mouth 2 (two) times daily with a meal.   Yes [provider]  metoprolol succinate (TOPROL-XL) 50 MG 24 hr tablet Take 50 mg by mouth daily. 09/04/17  Yes [provider]  mirtazapine (REMERON) 30 MG tablet Take 30 mg by mouth at bedtime.   Yes [provider]  risperiDONE (RISPERDAL) 1 MG tablet Take 1 tablet (1 mg total) by mouth at bedtime. Patient taking differently: Take 1.5 mg by mouth at bedtime.  01/03/17  Yes Enid BaasKalisetti, Radhika, MD  torsemide (DEMADEX) 20 MG tablet Take 20 mg by mouth daily.   Yes [provider]  metoprolol (LOPRESSOR) 50 MG tablet Take 1 tablet (50 mg total) by mouth 2 (two) times daily. Patient not taking: Reported on 11/02/2017 01/03/17   Enid BaasKalisetti, Radhika, MD      VITAL SIGNS:  Blood pressure 138/67, pulse 88, temperature 98.2 F (36.8 C), temperature source Oral, resp. rate (!) 30, SpO2 96 %.  PHYSICAL EXAMINATION:  Physical Exam  GENERAL:  67 y.o.-year-old patient lying in the bed lethargic but follows simple commands on BiPAP. EYES: Pupils equal, round, reactive to light and accommodation. No scleral icterus. Extraocular muscles intact.  HEENT: Head atraumatic, normocephalic. Oropharynx and nasopharynx clear. No oropharyngeal erythema, moist oral mucosa  NECK:  Supple, no jugular venous distention. No thyroid enlargement, no tenderness.  LUNGS: Normal breath sounds bilaterally, no wheezing, rales, rhonchi. No use of accessory muscles of respiration.  CARDIOVASCULAR: S1, S2 RRR. No murmurs, rubs, gallops, clicks.  ABDOMEN: Soft,  nontender, nondistended. Bowel sounds present. No organomegaly or mass. Large left-sided abdominal wall hernia that can be reduced. EXTREMITIES: +1-2 edema b/l, No cyanosis, or clubbing. + 2 pedal & radial pulses b/l.   NEUROLOGIC: Cranial nerves II through XII are intact. No focal Motor or sensory deficits appreciated b/l. Globally weak.  PSYCHIATRIC: The patient is alert and oriented x 3.  SKIN: No obvious rash, lesion, or ulcer.   LABORATORY PANEL:   CBC Recent Labs  Lab 11/02/17 1204  WBC 9.0  HGB 14.1  HCT 44.4  PLT 175   ------------------------------------------------------------------------------------------------------------------  Chemistries  Recent Labs  Lab 11/02/17 1204  NA 137  K 4.3  CL 97*  CO2 33*  GLUCOSE 162*  BUN 32*  CREATININE 1.63*  CALCIUM 8.9  AST 17  ALT 11*  ALKPHOS 52  BILITOT 0.7   ------------------------------------------------------------------------------------------------------------------  Cardiac Enzymes Recent Labs  Lab 11/02/17 1204  TROPONINI <0.03   ------------------------------------------------------------------------------------------------------------------  RADIOLOGY:  Ct Angio Chest Pe W And/or Wo Contrast  Result Date: 11/02/2017 CLINICAL DATA:  Low oxygen saturation at home, on BiPAP, history diabetes mellitus, hypertension, former smoker, high pretest probability of  pulmonary embolism EXAM: CT ANGIOGRAPHY CHEST WITH CONTRAST TECHNIQUE: Multidetector CT imaging of the chest was performed using the standard protocol during bolus administration of intravenous contrast. Multiplanar CT image reconstructions and MIPs were obtained to evaluate the vascular anatomy. CONTRAST:  60 mL ISOVUE-370 IOPAMIDOL (ISOVUE-370) INJECTION 76% IV COMPARISON:  None FINDINGS: Cardiovascular: Atherosclerotic calcifications aorta and coronary arteries. Aorta normal caliber without aneurysm or dissection. No pericardial effusion. Mild  enlargement of cardiac chambers. Pulmonary arteries adequately opacified and patent. No evidence of pulmonary embolism. Mediastinum/Nodes: Mild mediastinal lipomatosis. Scattered normal size mediastinal lymph nodes. No thoracic adenopathy. Esophagus unremarkable. Base of cervical region normal appearance. Lungs/Pleura: Small RIGHT pleural effusion. Atelectasis in both lower lobes greater on RIGHT. Atelectasis at base of RIGHT middle lobe. No definite acute infiltrate, LEFT pleural effusion or pneumothorax. Upper Abdomen: Ascites in upper abdomen. Enlarged LEFT lobe and caudate lobe of liver with smaller RIGHT lobe suggesting cirrhosis. Remaining visualized upper abdomen unremarkable. Musculoskeletal: Mild degenerative disc disease changes of the lower thoracic spine. Review of the MIP images confirms the above findings. IMPRESSION: No evidence pulmonary embolism. Small RIGHT pleural effusion with bibasilar atelectasis greater in RIGHT lower lobe. Small amount of ascites in upper abdomen with altered hepatic morphology raising question of cirrhosis. Aortic Atherosclerosis (ICD10-I70.0). Electronically Signed   By: Ulyses Southward M.D.   On: 11/02/2017 14:33   Dg Chest Port 1 View  Result Date: 11/02/2017 CLINICAL DATA:  Shortness of breath. EXAM: PORTABLE CHEST 1 VIEW COMPARISON:  12/31/2016 FINDINGS: 1203 hours. Patient is rotated to the left. Markedly low lung volumes with asymmetric elevation right hemidiaphragm. The cardio pericardial silhouette is enlarged. Abnormal soft tissue fullness left suprahilar region. Bibasilar collapse/consolidation, right greater than left with probable right pleural effusion. Bones are diffusely demineralized. Telemetry leads overlie the chest. IMPRESSION: Cardiomegaly with vascular congestion and bibasilar collapse/consolidation with small right pleural effusion. Abnormal cardiomediastinal contour with left suprahilar fullness may be related to patient rotation. Dedicated upright PA  and lateral chest x-ray, 1 able, would likely prove helpful to further evaluate. If the patient is unable to stand, CT imaging of the chest could be used to exclude central lung mass/mediastinal lymphadenopathy. Electronically Signed   By: Kennith Center M.D.   On: 11/02/2017 12:40     IMPRESSION AND PLAN:   67 yo female w/ hx of Obesity, PVD, hypertension, history of abdominal wall hernia, diabetes, depression who presented to the hospital due to lethargy, low O2 sats and noted to be in acute on chronic respiratory failure with hypercapnia.  1. Acute on chronic respiratory failure with hypoxemia/hypercapnia-suspected to be secondary to obesity pickwickian syndrome combined with underlying sleep apnea and may be some mild CHF. -Continue BiPAP support, follow serial ABGs. Wean off BiPAP as tolerated. -Patient will be admitted to the stepdown level of care, pulmonary consult obtained and discussed w/ Dr. Lonn Georgia.   2. Essential hypertension-continue Norvasc, metoprolol, losartan  3. Diabetes type 2 without complication-Place on sliding scale insulin. Hold metformin.  4. Glaucoma-continue latanoprost eyedrops.  5. Depression-continue Remeron, Risperdal.  All the records are reviewed and case discussed with ED provider. Management plans discussed with the patient, family and they are in agreement.  CODE STATUS: Full code  TOTAL TIME TAKING CARE OF THIS PATIENT: 45 minutes.    Houston Siren M.D on 11/02/2017 at 4:09 PM  Between 7am to 6pm - Pager - [phone]  After 6pm go to [email or web address] - password [email or web address] [facility]  [facility]  Office  [phone]  CC:  Primary care physician; Lauro Regulus, MD

## 2017-11-02 NOTE — Progress Notes (Signed)
   11/02/17 1950  Urine Characteristics  Bladder Scan Volume (mL) 681 mL   Notified Tukov, NP.  Straight cath x1.

## 2017-11-03 DIAGNOSIS — I480 Paroxysmal atrial fibrillation: Secondary | ICD-10-CM

## 2017-11-03 LAB — GLUCOSE, CAPILLARY
GLUCOSE-CAPILLARY: 160 mg/dL — AB (ref 65–99)
Glucose-Capillary: 85 mg/dL (ref 65–99)
Glucose-Capillary: 145 mg/dL — ABNORMAL HIGH (ref 65–99)

## 2017-11-03 LAB — CBC
HEMATOCRIT: 41.9 % (ref 35.0–47.0)
Hemoglobin: 12.9 g/dL (ref 12.0–16.0)
MCH: 27.1 pg (ref 26.0–34.0)
MCHC: 30.8 g/dL — AB (ref 32.0–36.0)
MCV: 88 fL (ref 80.0–100.0)
Platelets: 171 10*3/uL (ref 150–440)
RBC: 4.77 MIL/uL (ref 3.80–5.20)
RDW: 18.2 % — AB (ref 11.5–14.5)
WBC: 8 10*3/uL (ref 3.6–11.0)

## 2017-11-03 LAB — BASIC METABOLIC PANEL
Anion gap: 8 (ref 5–15)
BUN: 32 mg/dL — AB (ref 6–20)
CHLORIDE: 98 mmol/L — AB (ref 101–111)
CO2: 33 mmol/L — AB (ref 22–32)
Calcium: 8.9 mg/dL (ref 8.9–10.3)
Creatinine, Ser: 1.69 mg/dL — ABNORMAL HIGH (ref 0.44–1.00)
GFR calc Af Amer: 35 mL/min — ABNORMAL LOW (ref >60)
GFR calc non Af Amer: 30 mL/min — ABNORMAL LOW (ref >60)
GLUCOSE: 85 mg/dL (ref 65–99)
POTASSIUM: 4.2 mmol/L (ref 3.5–5.1)
Sodium: 139 mmol/L (ref 135–145)

## 2017-11-03 LAB — URINALYSIS, COMPLETE (UACMP) WITH MICROSCOPIC
Bacteria, UA: NONE SEEN
Bilirubin Urine: NEGATIVE
Glucose, UA: NEGATIVE mg/dL
KETONES UR: NEGATIVE mg/dL
LEUKOCYTES UA: NEGATIVE
Nitrite: NEGATIVE
PROTEIN: 30 mg/dL — AB
Specific Gravity, Urine: 1.018 (ref 1.005–1.030)
pH: 5 (ref 5.0–8.0)

## 2017-11-03 LAB — PHOSPHORUS: PHOSPHORUS: 4.2 mg/dL (ref 2.5–4.6)

## 2017-11-03 LAB — MAGNESIUM: Magnesium: 2.1 mg/dL (ref 1.7–2.4)

## 2017-11-03 MED ORDER — JUVEN PO PACK
2.0000 | PACK | Freq: Two times a day (BID) | ORAL | Status: DC
  Administered 2017-11-06 – 2017-11-08 (×4): 2 via ORAL

## 2017-11-03 MED ORDER — ENOXAPARIN SODIUM 40 MG/0.4ML ~~LOC~~ SOLN
40.0000 mg | Freq: Two times a day (BID) | SUBCUTANEOUS | Status: DC
  Administered 2017-11-03 (×2): 40 mg via SUBCUTANEOUS
  Filled 2017-11-03 (×2): qty 0.4

## 2017-11-03 MED ORDER — AMIODARONE HCL IN DEXTROSE 360-4.14 MG/200ML-% IV SOLN
60.0000 mg/h | INTRAVENOUS | Status: AC
  Administered 2017-11-03: 60 mg/h via INTRAVENOUS
  Filled 2017-11-03: qty 200

## 2017-11-03 MED ORDER — AMIODARONE IV BOLUS ONLY 150 MG/100ML
150.0000 mg | Freq: Once | INTRAVENOUS | Status: AC
  Administered 2017-11-03: 150 mg via INTRAVENOUS
  Filled 2017-11-03: qty 100

## 2017-11-03 MED ORDER — AMIODARONE HCL IN DEXTROSE 360-4.14 MG/200ML-% IV SOLN
30.0000 mg/h | INTRAVENOUS | Status: DC
  Administered 2017-11-03 – 2017-11-04 (×2): 30 mg/h via INTRAVENOUS
  Filled 2017-11-03 (×2): qty 200

## 2017-11-03 NOTE — Progress Notes (Signed)
[facility] - [place] at [facility]   PATIENT NAME: Jasmine Osborn    MR#:  [id number]  DATE OF BIRTH:  07/30/1951  SUBJECTIVE:  CHIEF COMPLAINT:   Chief Complaint  Patient presents with  . Shortness of Breath  feels SOB, a.fib this am REVIEW OF SYSTEMS:  Review of Systems  Constitutional: Negative for chills, fever and weight loss.  HENT: Negative for nosebleeds and sore throat.   Eyes: Negative for blurred vision.  Respiratory: Positive for shortness of breath. Negative for cough and wheezing.   Cardiovascular: Negative for chest pain, orthopnea, leg swelling and PND.  Gastrointestinal: Negative for abdominal pain, constipation, diarrhea, heartburn, nausea and vomiting.  Genitourinary: Negative for dysuria and urgency.  Musculoskeletal: Negative for back pain.  Skin: Negative for rash.  Neurological: Negative for dizziness, speech change, focal weakness and headaches.  Endo/Heme/Allergies: Does not bruise/bleed easily.  Psychiatric/Behavioral: Negative for depression.    DRUG ALLERGIES:  No Known Allergies VITALS:  Blood pressure 90/65, pulse (!) 104, temperature 98 F (36.7 C), temperature source Axillary, resp. rate (!) 25, height 5\' 4"  (1.626 m), weight 124 kg (273 lb 5.9 oz), SpO2 91 %. PHYSICAL EXAMINATION:  Physical Exam  Constitutional: She is oriented to person, place, and time and well-developed, well-nourished, and in no distress.  HENT:  Head: Normocephalic and atraumatic.  Eyes: Conjunctivae and EOM are normal. Pupils are equal, round, and reactive to light.  Neck: Normal range of motion. Neck supple. No tracheal deviation present. No thyromegaly present.  Cardiovascular: Normal rate, regular rhythm and normal heart sounds.  Pulmonary/Chest: Effort normal and breath sounds normal. No respiratory distress. She has no wheezes. She exhibits no tenderness.  Abdominal: Soft. Bowel sounds are normal. She exhibits no distension. There is no  tenderness.  Musculoskeletal: Normal range of motion.  Neurological: She is alert and oriented to person, place, and time. No cranial nerve deficit.  Skin: Skin is warm and dry. No rash noted.  Psychiatric: Mood and affect normal.   LABORATORY PANEL:  Female CBC Recent Labs  Lab 11/03/17 0505  WBC 8.0  HGB 12.9  HCT 41.9  PLT 171   ------------------------------------------------------------------------------------------------------------------ Chemistries  Recent Labs  Lab 11/02/17 1204 11/03/17 0505  NA 137 139  K 4.3 4.2  CL 97* 98*  CO2 33* 33*  GLUCOSE 162* 85  BUN 32* 32*  CREATININE 1.63* 1.69*  CALCIUM 8.9 8.9  MG  --  2.1  AST 17  --   ALT 11*  --   ALKPHOS 52  --   BILITOT 0.7  --    RADIOLOGY:  No results found. ASSESSMENT AND PLAN:  67 yo female w/ hx of Obesity, PVD, hypertension, history of abdominal wall hernia, diabetes, depression who presented to the hospital due to lethargy, low O2 sats and noted to be in acute on chronic respiratory failure with hypercapnia.  1. Acute on chronic respiratory failure with hypoxemia/hypercapnia-suspected to be secondary to obesity pickwickian syndrome combined with underlying sleep apnea and may be some mild CHF. -Continue BiPAP per PCCM  2. Essential hypertension-continue Norvasc, metoprolol, losartan  3. Diabetes type 2 without complication-Place on sliding scale insulin. Hold metformin.  4. Glaucoma-continue latanoprost eyedrops.  5. Depression-continue Remeron, Risperdal.       All the records are reviewed and case discussed with Care Management/Social Worker. Management plans discussed with the patient, nursing and they are in agreement.  CODE STATUS: Full Code  TOTAL TIME TAKING CARE OF THIS PATIENT: 15 minutes.  More than 50% of the time was spent in counseling/coordination of care: YES  POSSIBLE D/C IN 2-3 DAYS, DEPENDING ON CLINICAL CONDITION.   Delfino Lovett M.D on 11/03/2017 at 8:13  PM  Between 7am to 6pm - Pager - [phone]  After 6pm go to [email or web address] - Scientist, research (life sciences) [facility]  Office  [phone]  CC: Primary care physician; Lauro Regulus, MD  Note: This dictation was prepared with Dragon dictation along with smaller phrase technology. Any transcriptional errors that result from this process are unintentional.

## 2017-11-03 NOTE — Progress Notes (Signed)
Anticoagulation monitoring(Lovenox):  67yo  female ordered Lovenox 30 mg Q24h  Filed Weights   11/02/17 1728  Weight: 273 lb 5.9 oz (124 kg)   BMI 46.7   Lab Results  Component Value Date   CREATININE 1.69 (H) 11/03/2017   CREATININE 1.63 (H) 11/02/2017   CREATININE 1.40 (H) 07/14/2017   Estimated Creatinine Clearance: 42.6 mL/min (A) (by C-G formula based on SCr of 1.69 mg/dL (H)). Hemoglobin & Hematocrit     Component Value Date/Time   HGB 12.9 11/03/2017 0505   HGB 14.3 05/07/2013 0544   HCT 41.9 11/03/2017 0505   HCT 41.3 05/07/2013 0544     Per Protocol for Patient with estCrcl > 30 ml/min and BMI > 40, will transition to Lovenox 40 mg Q12h.

## 2017-11-03 NOTE — Progress Notes (Signed)
Transitioned from BiPAP to [place] O2 this a.m. Appears comfortable without respiratory distress Denies pain Developed paroxysmal atrial fibrillation with RVR this a.m.  Vitals:   11/03/17 1200 11/03/17 1255 11/03/17 1300 11/03/17 1305  BP: 112/70 (!) 86/52 (!) 87/48 91/72  Pulse: (!) 145 (!) 104 (!) 104 (!) 106  Resp: (!) 26 (!) 27 (!) 27 (!) 21  Temp: 98.2 F (36.8 C)     TempSrc: Oral     SpO2: 93% 91% 92% 94%  Weight:      Height:       NAD HEENT WNL JVP cannot be visualized No wheezes IR IR, tachycardia, no M NABS, soft, large ventral hernia Symmetric LE edema with chronic stasis changes No focal neurologic deficits  BMP Latest Ref Rng & Units 11/03/2017 11/02/2017 07/14/2017  Glucose 65 - 99 mg/dL 85 161(W162(H) -  BUN 6 - 20 mg/dL 96(E32(H) 45(W32(H) -  Creatinine 0.44 - 1.00 mg/dL 0.98(J1.69(H) 1.91(Y1.63(H) 7.82(N1.40(H)  Sodium 135 - 145 mmol/L 139 137 -  Potassium 3.5 - 5.1 mmol/L 4.2 4.3 -  Chloride 101 - 111 mmol/L 98(L) 97(L) -  CO2 22 - 32 mmol/L 33(H) 33(H) -  Calcium 8.9 - 10.3 mg/dL 8.9 8.9 -    CBC Latest Ref Rng & Units 11/03/2017 11/02/2017 01/03/2017  WBC 3.6 - 11.0 K/uL 8.0 9.0 8.5  Hemoglobin 12.0 - 16.0 g/dL 56.212.9 13.014.1 86.512.4  Hematocrit 35.0 - 47.0 % 41.9 44.4 39.0  Platelets 150 - 440 K/uL 171 175 221    CXR 1/27: edema pattern with RLL atx/eff  IMPRESSION: Acute/chronic hypercarbic respiratory failure Biventricular heart failure Pulmonary hypertension by echocardiogram 12/2016 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 PAF with RVR  PLAN/REC: Continue supplemental oxygen to maintain SPO2 88-93% Continue mandatory nocturnal BiPAP Continue BiPAP as needed during the day for respiratory distress or refractory hypoxemia Amiodarone bolus and infusion Will consider cardiology consultation   It does not appear that she has been seen by any cardiologist in recent past Advance diet and activity as tolerated Upon DC to home, she will likely need supplemental O2 and would likely  benefit from nocturnal BiPAP After DC to home, she should get [facility]  Billy Fischeravid Aliayah Tyer, MD PCCM service Mobile [phone] Pager [phone] 11/03/2017 1:50 PM

## 2017-11-03 NOTE — Progress Notes (Signed)
Palliative Medicine consult noted. Due to high referral volume, there may be a delay seeing this patient. Please call the Palliative Medicine Team office at [phone] if recommendations are needed in the interim.  Thank you for inviting us to see this patient.  Margret ChanceMelanie G. Juanya Villavicencio, RN, BSN, [facility] Palliative Medicine Team 11/03/2017 1:24 PM Office [phone]

## 2017-11-03 NOTE — Progress Notes (Signed)
Initial Nutrition Assessment  DOCUMENTATION CODES:   Morbid obesity  INTERVENTION:  Juven 2 pkts BID, each supplement provides 80 calories and 14g amino acids  NUTRITION DIAGNOSIS:   Increased nutrient needs related to wound healing as evidenced by estimated needs.  GOAL:   Patient will meet greater than or equal to 90% of their needs  MONITOR:   PO intake, I & O's, Labs, Supplement acceptance, Weight trends  REASON FOR ASSESSMENT:   Low Braden    ASSESSMENT:   Jasmine Osborn  is a 67 y.o. female with a known history of obesity, peripheral vascular disease, hypertension, hyperlipidemia, diabetes, abdominal wall hernia, suspected sleep apnea who presents to the hospital due to lethargy and low O2 sats. Found to have acute on chronic respiratory failure, obesity hypoventilation syndrome, R pleural effusion.  Spoke with Jasmine Osborn and family at bedside. Saw patient back in March of 2018. Family states nothing has really changed. Patient was on BiPAP this morning, did not have any breakfast. Lunch ordered. Normally eats a light lunch, a decent lunch and a large dinner. Weight has been stable. No other issues at this time.  Labs reviewed: BNP 1005  Medications reviewed and include:  Insulin, Remeron, 20K+    NUTRITION - FOCUSED PHYSICAL EXAM:    Most Recent Value  Orbital Region  No depletion  Upper Arm Region  No depletion  Thoracic and Lumbar Region  No depletion  Buccal Region  No depletion  Temple Region  No depletion  Clavicle Bone Region  No depletion  Clavicle and Acromion Bone Region  No depletion  Scapular Bone Region  No depletion  Dorsal Hand  No depletion  Patellar Region  No depletion  Anterior Thigh Region  No depletion  Posterior Calf Region  No depletion  Edema (RD Assessment)  None  Hair  Reviewed  Eyes  Reviewed  Mouth  Reviewed  Skin  Reviewed  Nails  Reviewed       Diet Order:  Diet Heart Room service appropriate? Yes; Fluid  consistency: Thin  EDUCATION NEEDS:   Education needs have been addressed  Skin:  Skin Assessment: Skin Integrity Issues: Skin Integrity Issues:: Stage I, Stage III Stage I: to buttocks Stage III: to heel  Last BM:  11/02/2016  Height:   Ht Readings from Last 1 Encounters:  11/02/17 5\' 4"  (1.626 m)    Weight:   Wt Readings from Last 1 Encounters:  11/02/17 273 lb 5.9 oz (124 kg)    Ideal Body Weight:  54.54 kg  BMI:  Body mass index is 46.92 kg/m.  Estimated Nutritional Needs:   Kcal:  2200-2500 calories  Protein:  109-136 grams  Fluid:  Per MD  Dionne AnoWilliam M. Karimah Winquist, MS, RD LDN Inpatient Clinical Dietitian Pager [phone]

## 2017-11-04 ENCOUNTER — Inpatient Hospital Stay (HOSPITAL_COMMUNITY)
Admit: 2017-11-04 | Discharge: 2017-11-04 | Disposition: A | Discharge status: Home / Self Care | Payer: Medicare HMO | Attending: Physician Assistant | Admitting: Physician Assistant

## 2017-11-04 ENCOUNTER — Inpatient Hospital Stay: Payer: Medicare HMO

## 2017-11-04 DIAGNOSIS — Z7189 Other specified counseling: Secondary | ICD-10-CM

## 2017-11-04 DIAGNOSIS — N179 Acute kidney failure, unspecified: Secondary | ICD-10-CM

## 2017-11-04 DIAGNOSIS — R0600 Dyspnea, unspecified: Secondary | ICD-10-CM

## 2017-11-04 DIAGNOSIS — Z515 Encounter for palliative care: Secondary | ICD-10-CM

## 2017-11-04 LAB — COMPREHENSIVE METABOLIC PANEL
ALBUMIN: 3.4 g/dL — AB (ref 3.5–5.0)
ALT: 9 U/L — ABNORMAL LOW (ref 14–54)
ANION GAP: 8 (ref 5–15)
AST: 14 U/L — AB (ref 15–41)
Alkaline Phosphatase: 45 U/L (ref 38–126)
BILIRUBIN TOTAL: 0.9 mg/dL (ref 0.3–1.2)
BUN: 39 mg/dL — ABNORMAL HIGH (ref 6–20)
CALCIUM: 8.8 mg/dL — AB (ref 8.9–10.3)
CO2: 32 mmol/L (ref 22–32)
Chloride: 100 mmol/L — ABNORMAL LOW (ref 101–111)
Creatinine, Ser: 1.88 mg/dL — ABNORMAL HIGH (ref 0.44–1.00)
GFR calc Af Amer: 31 mL/min — ABNORMAL LOW (ref >60)
GFR, EST NON AFRICAN AMERICAN: 27 mL/min — AB (ref >60)
GLUCOSE: 103 mg/dL — AB (ref 65–99)
Potassium: 4.5 mmol/L (ref 3.5–5.1)
Sodium: 140 mmol/L (ref 135–145)
TOTAL PROTEIN: 6.7 g/dL (ref 6.5–8.1)

## 2017-11-04 LAB — ECHOCARDIOGRAM COMPLETE
Height: 64 in
Weight: 4373.93 oz

## 2017-11-04 LAB — GLUCOSE, CAPILLARY
Glucose-Capillary: 86 mg/dL (ref 65–99)
Glucose-Capillary: 152 mg/dL — ABNORMAL HIGH (ref 65–99)
Glucose-Capillary: 98 mg/dL (ref 65–99)

## 2017-11-04 LAB — CBC
HEMATOCRIT: 42.4 % (ref 35.0–47.0)
HEMOGLOBIN: 13.1 g/dL (ref 12.0–16.0)
MCH: 26.7 pg (ref 26.0–34.0)
MCHC: 30.9 g/dL — AB (ref 32.0–36.0)
MCV: 86.6 fL (ref 80.0–100.0)
Platelets: 167 10*3/uL (ref 150–440)
RBC: 4.9 MIL/uL (ref 3.80–5.20)
RDW: 18.3 % — ABNORMAL HIGH (ref 11.5–14.5)
WBC: 8.1 10*3/uL (ref 3.6–11.0)

## 2017-11-04 LAB — URINE CULTURE: CULTURE: NO GROWTH

## 2017-11-04 LAB — TSH: TSH: 4.93 u[IU]/mL — ABNORMAL HIGH (ref 0.350–4.500)

## 2017-11-04 MED ORDER — PERFLUTREN LIPID MICROSPHERE
1.0000 mL | INTRAVENOUS | Status: AC | PRN
  Administered 2017-11-04: 2 mL via INTRAVENOUS
  Filled 2017-11-04: qty 10

## 2017-11-04 MED ORDER — APIXABAN 5 MG PO TABS
5.0000 mg | ORAL_TABLET | Freq: Two times a day (BID) | ORAL | Status: DC
  Administered 2017-11-04 – 2017-11-08 (×9): 5 mg via ORAL
  Filled 2017-11-04 (×9): qty 1

## 2017-11-04 NOTE — Progress Notes (Signed)
Pt refused morning bath. Stated she may want bath later in day.

## 2017-11-04 NOTE — Progress Notes (Signed)
Paroxysmal atrial fibrillation yesterday.  Now in NSR. No distress No new complaints Appears comfortable on [place] O2  Vitals:   11/04/17 0800 11/04/17 0900 11/04/17 1000 11/04/17 1100  BP: (!) 117/57 (!) 93/44 (!) 105/49 (!) 101/47  Pulse: 86 88 85 95  Resp: (!) 31 (!) 25 (!) 23 (!) 25  Temp: 98.3 F (36.8 C)     TempSrc: Oral     SpO2: 100% 97% 92% 92%  Weight:      Height:        NAD HEENT WNL JVP not visualized Chest clear anteriorly Regular, rate normal, no M NABS, soft, nonreducible large ventral hernia Symmetric lower extremity edema with chronic stasis changes Cognition intact and no focal neurologic deficits  BMP Latest Ref Rng & Units 11/04/2017 11/03/2017 11/02/2017  Glucose 65 - 99 mg/dL 161(W103(H) 85 960(A162(H)  BUN 6 - 20 mg/dL 54(U39(H) 98(J32(H) 19(J32(H)  Creatinine 0.44 - 1.00 mg/dL 4.78(G1.88(H) 9.56(O1.69(H) 1.30(Q1.63(H)  Sodium 135 - 145 mmol/L 140 139 137  Potassium 3.5 - 5.1 mmol/L 4.5 4.2 4.3  Chloride 101 - 111 mmol/L 100(L) 98(L) 97(L)  CO2 22 - 32 mmol/L 32 33(H) 33(H)  Calcium 8.9 - 10.3 mg/dL 6.5(H8.8(L) 8.9 8.9    CBC Latest Ref Rng & Units 11/04/2017 11/03/2017 11/02/2017  WBC 3.6 - 11.0 K/uL 8.1 8.0 9.0  Hemoglobin 12.0 - 16.0 g/dL 84.613.1 96.212.9 95.214.1  Hematocrit 35.0 - 47.0 % 42.4 41.9 44.4  Platelets 150 - 440 K/uL 167 171 175    CXR: Bibasilar infiltrates/atelectasis, small right effusion  IMPRESSION: Acute on chronic hypercarbic respiratory failure 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 Pulmonary hypertension by echocardiogram 03/18 Biventricular failure Paroxysmal atrial fibrillation with RVR -now NSR after amiodarone load AKI/CKD -baseline creatinine 1.4  PLAN/REC: Transfer to MedSurg with cardiac monitoring Advance diet and activity Discontinue Foley catheter Hold ARB and potassium supplementation Cardiology managing atrial fibrillation Continue nocturnal BiPAP Continue supplemental oxygen as needed to keep SPO2 >90% PT evaluation ordered  If she is to go home  with nocturnal oxygen, she will require overnight oximetry prior to discharge  After transfer, PCCM will sign off. Please call if we can be of further assistance    Billy Fischeravid Tyechia Allmendinger, MD PCCM service Mobile [phone] Pager [phone] 11/04/2017 1:22 PM

## 2017-11-04 NOTE — Evaluation (Signed)
Physical Therapy Evaluation Patient Details Name: Jasmine Osborn MRN: [id number] DOB: 10/16/1950 Today's Date: 11/04/2017   History of Present Illness  67 y.o. female with a known history of obesity, peripheral vascular disease, hypertension, hyperlipidemia, diabetes, abdominal wall hernia, suspected sleep apnea who presents to the hospital due to lethargy and low O2 sats.  Recently diagnosed with UTI.    Clinical Impression  Pt was able to participate relatively well with PT though she did have some issues that limited ambulation ability.  Pt was able to participate with ~10 minutes of LE exercises apart from the PT exam w/o excessive fatigue.  Her O2 remained in the mid 90s on 2 liters O2, but did drop into the 80s quickly with light in-bed activity on room air.  Pt did have some issues with loose stool while ambulating, unsure if she could have done a whole lot more than the 50-60 ft she did anyway secondary to fatigue and heavy reliance on the walker. Pt feels confident that she will be able to go home with husband helping once she is better able to participate with PT.  Will endorse  HHPT for likely discharge disposition, but would like to insure pt can do more.    Follow Up Recommendations Home health PT(per continued improvement)    Equipment Recommendations       Recommendations for Other Services       Precautions / Restrictions Precautions Precautions: Fall Restrictions Weight Bearing Restrictions: No      Mobility  Bed Mobility Overal bed mobility: Needs Assistance Bed Mobility: Supine to Sit;Sit to Supine     Supine to sit: Min guard Sit to supine: Mod assist   General bed mobility comments: Pt showed good effort in getting to sitting and did not need direct physical assist, did need heavy assist getting to supine after ambulation  Transfers Overall transfer level: Modified independent Equipment used: Rolling walker (2 wheeled)             General  transfer comment: Pt was able to get to standing w/o direct assist using walker to maintain balance  Ambulation/Gait Ambulation/Gait assistance: Min guard Ambulation Distance (Feet): 50 Feet Assistive device: Rolling walker (2 wheeled)       General Gait Details: Pt was able to do some ambulation with walker but started having some issues with stool incontinence and needed to return to the room quickly.  She was on 2 liters O2 during the effort but did become very fatigued and ultimately was very reliant on the walker the entire time she was up.  She reported good confidence and thought she could do much more had the BM not been an issue.  Stairs            Wheelchair Mobility    Modified Rankin (Stroke Patients Only)       Balance Overall balance assessment: Needs assistance Sitting-balance support: No upper extremity supported Sitting balance-Leahy Scale: Good       Standing balance-Leahy Scale: Fair Standing balance comment: Pt appeared very reliant on the walker for standing balance                             Pertinent Vitals/Pain Pain Assessment: No/denies pain    Home Living Family/patient expects to be discharged to:: Private residence Living Arrangements: Spouse/significant other Available Help at Discharge: Family;Available PRN/intermittently Type of Home: House Home Access: Stairs to enter Entrance Stairs-Rails: Right Entrance Stairs-Number  of Steps: 4   Home Equipment: Walker - 2 wheels      Prior Function Level of Independence: Needs assistance   Gait / Transfers Assistance Needed: States she does not always need the walker in the house  ADL's / Homemaking Assistance Needed: Performed by husband        Hand Dominance        Extremity/Trunk Assessment        Lower Extremity Assessment Lower Extremity Assessment: Generalized weakness(L UE weaker than R)       Communication   Communication: No difficulties  Cognition  Arousal/Alertness: Awake/alert Behavior During Therapy: WFL for tasks assessed/performed Overall Cognitive Status: Within Functional Limits for tasks assessed                                        General Comments      Exercises General Exercises - Lower Extremity Ankle Circles/Pumps: Strengthening;5 reps;Both Heel Slides: Strengthening;5 reps;Both Hip ABduction/ADduction: Strengthening;5 reps;Both Straight Leg Raises: Strengthening;5 reps;Both   Assessment/Plan    PT Assessment Patient needs continued PT services  PT Problem List Decreased strength;Decreased range of motion;Decreased activity tolerance;Decreased balance;Decreased mobility;Decreased coordination;Decreased knowledge of use of DME;Decreased safety awareness;Decreased knowledge of precautions;Cardiopulmonary status limiting activity       PT Treatment Interventions DME instruction;Gait training;Stair training;Therapeutic activities;Functional mobility training;Therapeutic exercise;Balance training;Neuromuscular re-education;Patient/family education    PT Goals (Current goals can be found in the Care Plan section)  Acute Rehab PT Goals Patient Stated Goal: Go home PT Goal Formulation: With patient Time For Goal Achievement: 11/18/17 Potential to Achieve Goals: Good    Frequency Min 2X/week   Barriers to discharge        Co-evaluation               AM-PAC PT "6 Clicks" Daily Activity  Outcome Measure Difficulty turning over in bed (including adjusting bedclothes, sheets and blankets)?: A Little Difficulty moving from lying on back to sitting on the side of the bed? : A Lot Difficulty sitting down on and standing up from a chair with arms (e.g., wheelchair, bedside commode, etc,.)?: A Little Help needed moving to and from a bed to chair (including a wheelchair)?: A Little Help needed walking in hospital room?: A Little Help needed climbing 3-5 steps with a railing? : A Lot 6 Click  Score: 16    End of Session Equipment Utilized During Treatment: Gait belt Activity Tolerance: Patient tolerated treatment well;Patient limited by fatigue Patient left: with call bell/phone within reach;in bed;with nursing/sitter in room Nurse Communication: Mobility status(need help with clean up) PT Visit Diagnosis: Muscle weakness (generalized) (M62.81);Difficulty in walking, not elsewhere classified (R26.2)    Time: 5784-6962 PT Time Calculation (min) (ACUTE ONLY): 54 min   Charges:   PT Evaluation $PT Eval Low Complexity: 1 Low PT Treatments $Therapeutic Exercise: 8-22 mins   PT G Codes:        Malachi Pro, DPT 11/04/2017, 5:10 PM

## 2017-11-04 NOTE — Consult Note (Signed)
Cardiology Consultation:   Patient ID: Jasmine Osborn; [id number]; 11/20/1950   Admit date: 11/02/2017 Date of Consult: 11/04/2017  Primary Care Provider: [name], Marshall W, MD Primary Cardiologist: [facility] - consult by End   Patient Profile:   [name] is a 67 y.o. female with a hx of chronic respiratory failure felt to be 2/2 OHS/OSA, pulmonary hypertension, HTN, HLD, DM, morbid obesity, and depression who is being seen today for the evaluation of Afib with RVR at the request of Dr. Sherryll BurgerShah.  History of Present Illness:   Jasmine Osborn has no previously known cardiac history. She reports her husband applied a pulse oximeter to her finger on 1/27 with a reported reading of O2 sats in the 60s on room air. Patient denied any SOB associated with this. Never with any chest pain. Of note, the patient had recently been seen by her PCP for a UTI and was placed on Omnicef. Because of her hypoxia, [facility] where she continued to be hypoxic and hypercapneic, requiring BiPAP. Initial EKG showed sinus tachycardia with heart rates in the low 100s bpm. CTA chest was negative for PE with a small right pleural effusion. It was felt her acute on chronic respiratory failure with hypoxia and hypercapnia was 2/2 OHS/OSA and pulmonary HTN. She developed new onset Afib with RVR on 1/28 and was placed on an amiodarone infusion. She has since converted to sinus rhythm. TSH is elevated a 4.930. Potassium at goal. She continues to require BiPAP.     Past Medical History:  Diagnosis Date  . Depression   . Diabetes mellitus without complication (HCC)   . Elevated lipids   . Hernia of abdominal wall   . Hypertension   . Increased endometrial stripe thickness 06/04/2017  . Increased endometrial stripe thickness 06/04/2017  . Psychosis (HCC)   . PVD (peripheral vascular disease) (HCC)    heel ulcer Lt     Past Surgical History:  Procedure Laterality Date  . COLON SURGERY      . DILATION AND CURETTAGE OF UTERUS    . HERNIA REPAIR    . TIBIA FRACTURE SURGERY Left      Home Meds: Prior to Admission medications   Medication Sig Start Date End Date Taking? Authorizing Provider  amLODipine (NORVASC) 5 MG tablet Take 5 mg by mouth daily.   Yes [provider]  aspirin 81 MG tablet Take 81 mg by mouth daily.   Yes [provider]  cefdinir (OMNICEF) 300 MG capsule Take 300 mg by mouth 2 (two) times daily. 10/31/17 11/06/17 Yes [provider]  fenofibrate 160 MG tablet Take 160 mg by mouth daily.   Yes [provider]  KLOR-CON 10 10 MEQ tablet Take 20 mEq by mouth daily.  10/22/16  Yes [provider]  latanoprost (XALATAN) 0.005 % ophthalmic solution USE [facility] DROP [facility] AT BEDTIME 05/18/15  Yes [provider]  losartan (COZAAR) 50 MG tablet Take 50 mg by mouth daily.   Yes [provider]  megestrol (MEGACE) 40 MG tablet Take 80 mg by mouth 2 (two) times daily. 12/11/16  Yes [provider]  metFORMIN (GLUCOPHAGE) 500 MG tablet Take 500 mg by mouth 2 (two) times daily with a meal.   Yes [provider]  metoprolol succinate (TOPROL-XL) 50 MG 24 hr tablet Take 50 mg by mouth daily. 09/04/17  Yes [provider]  mirtazapine (REMERON) 30 MG tablet Take  30 mg by mouth at bedtime.   Yes [provider]  risperiDONE (RISPERDAL) 1 MG tablet Take 1 tablet (1 mg total) by mouth at bedtime. Patient taking differently: Take 1.5 mg by mouth at bedtime.  01/03/17  Yes Enid Baas, MD  torsemide (DEMADEX) 20 MG tablet Take 20 mg by mouth daily.   Yes [provider]  metoprolol (LOPRESSOR) 50 MG tablet Take 1 tablet (50 mg total) by mouth 2 (two) times daily. Patient not taking: Reported on 11/02/2017 01/03/17   Enid Baas, MD    Inpatient Medications: Scheduled Meds: . apixaban  5 mg Oral BID  . chlorhexidine  15 mL Mouth Rinse BID  . fenofibrate  160 mg  Oral Daily  . insulin aspart  0-5 Units Subcutaneous QHS  . insulin aspart  0-9 Units Subcutaneous TID WC  . latanoprost  1 drop Both Eyes QHS  . mouth rinse  15 mL Mouth Rinse q12n4p  . metoprolol succinate  50 mg Oral Daily  . mirtazapine  30 mg Oral QHS  . nutrition supplement (JUVEN)  2 packet Oral BID BM   Continuous Infusions:  PRN Meds: acetaminophen **OR** [DISCONTINUED] acetaminophen, [DISCONTINUED] ondansetron **OR** ondansetron (ZOFRAN) IV  Allergies:  No Known Allergies  Social History:   Social History   Socioeconomic History  . Marital status: Married    Spouse name: Not on file  . Number of children: Not on file  . Years of education: Not on file  . Highest education level: Not on file  Social Needs  . Financial resource strain: Not on file  . Food insecurity - worry: Not on file  . Food insecurity - inability: Not on file  . Transportation needs - medical: Not on file  . Transportation needs - non-medical: Not on file  Occupational History  . Not on file  Tobacco Use  . Smoking status: Former Games developer  . Smokeless tobacco: Never Used  Substance and Sexual Activity  . Alcohol use: No  . Drug use: No  . Sexual activity: Not on file  Other Topics Concern  . Not on file  Social History Narrative  . Not on file     Family History:   Family History  Problem Relation Age of Onset  . Dementia Mother   . Valvular heart disease Father     ROS:  Review of Systems  Constitutional: Positive for malaise/fatigue. Negative for chills, diaphoresis, fever and weight loss.  HENT: Negative for congestion.   Eyes: Negative for discharge and redness.  Respiratory: Positive for shortness of breath. Negative for cough, hemoptysis, sputum production and wheezing.   Cardiovascular: Negative for chest pain, palpitations, orthopnea, claudication, leg swelling and PND.  Gastrointestinal: Negative for abdominal pain, blood in stool, heartburn, melena, nausea and vomiting.   Genitourinary: Negative for hematuria.  Musculoskeletal: Negative for falls and myalgias.  Skin: Negative for rash.  Neurological: Positive for weakness. Negative for dizziness, tingling, tremors, sensory change, speech change, focal weakness and loss of consciousness.  Endo/Heme/Allergies: Does not bruise/bleed easily.  Psychiatric/Behavioral: Negative for substance abuse. The patient is not nervous/anxious.   All other systems reviewed and are negative.     Physical Exam/Data:   Vitals:   11/04/17 0500 11/04/17 0600 11/04/17 0700 11/04/17 0800  BP:  120/73 125/72 (!) 117/57  Pulse: 85 80 80 86  Resp: (!) 28 (!) 25 (!) 28 (!) 31  Temp: 99.8 F (37.7 C)   98.3 F (36.8 C)  TempSrc: Axillary  Oral  SpO2: 93% 97% 95% 100%  Weight:      Height:        Intake/Output Summary (Last 24 hours) at 11/04/2017 1054 Last data filed at 11/04/2017 0849 Gross per 24 hour  Intake 516.5 ml  Output 1050 ml  Net -533.5 ml   Filed Weights   11/02/17 1728  Weight: 273 lb 5.9 oz (124 kg)   Body mass index is 46.92 kg/m.   Physical Exam: General: Well developed, well nourished, in no acute distress. Head: Normocephalic, atraumatic, sclera non-icteric, no xanthomas, nares without discharge. Neck: Negative for carotid bruits. JVD not elevated. Lungs: Diminished breath sounds bilaterally. Breathing is unlabored. On BiPAP.  Heart: RRR with S1 S2. No murmurs, rubs, or gallops appreciated. Abdomen: Soft, non-tender, non-distended with normoactive bowel sounds. No hepatomegaly. No rebound/guarding. No obvious abdominal masses. Msk:  Strength and tone appear normal for age. Extremities: No clubbing or cyanosis. 1-2+ pitting LE edema with chronic woody appearance. Distal pedal pulses are 2+ and equal bilaterally. Neuro: Alert and oriented X 3. No facial asymmetry. No focal deficit. Moves all extremities spontaneously. Psych:  Responds to questions appropriately with a normal affect.   EKG:  The  EKG was personally reviewed and demonstrates: sinus tachycardia, 108 bpm, left anterior fascicular block, nonspecific st/t changes Telemetry:  Telemetry was personally reviewed and demonstrates: Afib/flutter with RVR with heart rates in the 120s bpm. Currently in sinus rhythm in the 70s bpm  Weights: Filed Weights   11/02/17 1728  Weight: 273 lb 5.9 oz (124 kg)    Relevant CV Studies: TTE 12/2016: Study Conclusions  - Left ventricle: The cavity size was normal. There was mild   concentric hypertrophy. Systolic function was normal. The   estimated ejection fraction was in the range of 60% to 65%. The   study is not technically sufficient to allow evaluation of LV   diastolic function. - Left atrium: The atrium was mildly dilated. - Pulmonary arteries: Systolic pressure was moderately increased.   PA peak pressure: 50 mm Hg (S).  TTE pending  Laboratory Data:  Chemistry Recent Labs  Lab 11/02/17 1204 11/03/17 0505 11/04/17 0451  NA 137 139 140  K 4.3 4.2 4.5  CL 97* 98* 100*  CO2 33* 33* 32  GLUCOSE 162* 85 103*  BUN 32* 32* 39*  CREATININE 1.63* 1.69* 1.88*  CALCIUM 8.9 8.9 8.8*  GFRNONAA 32* 30* 27*  GFRAA 37* 35* 31*  ANIONGAP 7 8 8     Recent Labs  Lab 11/02/17 1204 11/04/17 0451  PROT 7.1 6.7  ALBUMIN 3.8 3.4*  AST 17 14*  ALT 11* 9*  ALKPHOS 52 45  BILITOT 0.7 0.9   Hematology Recent Labs  Lab 11/02/17 1204 11/03/17 0505 11/04/17 0451  WBC 9.0 8.0 8.1  RBC 5.06 4.77 4.90  HGB 14.1 12.9 13.1  HCT 44.4 41.9 42.4  MCV 87.8 88.0 86.6  MCH 27.9 27.1 26.7  MCHC 31.7* 30.8* 30.9*  RDW 18.4* 18.2* 18.3*  PLT 175 171 167   Cardiac Enzymes Recent Labs  Lab 11/02/17 1204  TROPONINI <0.03   No results for input(s): TROPIPOC in the last 168 hours.  BNP Recent Labs  Lab 11/02/17 1204  BNP 1,005.0*    DDimer No results for input(s): DDIMER in the last 168 hours.  Radiology/Studies:  Ct Angio Chest Pe Osborn And/or Wo Contrast  Result Date:  11/02/2017 CLINICAL DATA:  Low oxygen saturation at home, on BiPAP, history diabetes mellitus, hypertension, former smoker,  high pretest probability of pulmonary embolism EXAM: CT ANGIOGRAPHY CHEST WITH CONTRAST TECHNIQUE: Multidetector CT imaging of the chest was performed using the standard protocol during bolus administration of intravenous contrast. Multiplanar CT image reconstructions and MIPs were obtained to evaluate the vascular anatomy. CONTRAST:  60 mL ISOVUE-370 IOPAMIDOL (ISOVUE-370) INJECTION 76% IV COMPARISON:  None FINDINGS: Cardiovascular: Atherosclerotic calcifications aorta and coronary arteries. Aorta normal caliber without aneurysm or dissection. No pericardial effusion. Mild enlargement of cardiac chambers. Pulmonary arteries adequately opacified and patent. No evidence of pulmonary embolism. Mediastinum/Nodes: Mild mediastinal lipomatosis. Scattered normal size mediastinal lymph nodes. No thoracic adenopathy. Esophagus unremarkable. Base of cervical region normal appearance. Lungs/Pleura: Small RIGHT pleural effusion. Atelectasis in both lower lobes greater on RIGHT. Atelectasis at base of RIGHT middle lobe. No definite acute infiltrate, LEFT pleural effusion or pneumothorax. Upper Abdomen: Ascites in upper abdomen. Enlarged LEFT lobe and caudate lobe of liver with smaller RIGHT lobe suggesting cirrhosis. Remaining visualized upper abdomen unremarkable. Musculoskeletal: Mild degenerative disc disease changes of the lower thoracic spine. Review of the MIP images confirms the above findings. IMPRESSION: No evidence pulmonary embolism. Small RIGHT pleural effusion with bibasilar atelectasis greater in RIGHT lower lobe. Small amount of ascites in upper abdomen with altered hepatic morphology raising question of cirrhosis. Aortic Atherosclerosis (ICD10-I70.0). Electronically Signed   By: Ulyses Southward M.D.   On: 11/02/2017 14:33   Dg Chest Port 1 View  Result Date: 11/04/2017 CLINICAL DATA:   67 year old female with hypoxia. Small pleural effusions and lung base atelectasis on recent chest CTA EXAM: PORTABLE CHEST 1 VIEW COMPARISON:  11/02/2017 chest CTA head earlier. FINDINGS: Portable AP upright view at 0526 hours. The patient is mildly rotated to the right. Continued bibasilar opacity, greater on the left. Stable mediastinal contours. No pneumothorax. No overt pulmonary edema. IMPRESSION: Ventilation appears stable to the CTA on 11/02/2017 with bilateral lung base opacity. Electronically Signed   By: Odessa Fleming M.D.   On: 11/04/2017 07:23   Dg Chest Port 1 View  Result Date: 11/02/2017 CLINICAL DATA:  Shortness of breath. EXAM: PORTABLE CHEST 1 VIEW COMPARISON:  12/31/2016 FINDINGS: 1203 hours. Patient is rotated to the left. Markedly low lung volumes with asymmetric elevation right hemidiaphragm. The cardio pericardial silhouette is enlarged. Abnormal soft tissue fullness left suprahilar region. Bibasilar collapse/consolidation, right greater than left with probable right pleural effusion. Bones are diffusely demineralized. Telemetry leads overlie the chest. IMPRESSION: Cardiomegaly with vascular congestion and bibasilar collapse/consolidation with small right pleural effusion. Abnormal cardiomediastinal contour with left suprahilar fullness may be related to patient rotation. Dedicated upright PA and lateral chest x-ray, 1 able, would likely prove helpful to further evaluate. If the patient is unable to stand, CT imaging of the chest could be used to exclude central lung mass/mediastinal lymphadenopathy. Electronically Signed   By: Kennith Center M.D.   On: 11/02/2017 12:40    Assessment and Plan:   1. New onset Afib with RVR: -Converted to sinus rhythm on amiodarone infusion -Stop amio -Continue Toprol XL, titrate as needed -Start Eliquis 5 mg bid given a CHADS2VASC of at least 5 (HTN, age x 1, DM, vascular disease, female) -Likely in the setting of her acute illness -TSH elevated,  recommend follow up by IM -Maintain lytes at goal  2. Acute on chronic respiratory failure with hypoxia and hypercapnia: -Felt to be multifactorial including morbid obesity with OHS/OSA and pulmonary HTN -Per PCCM  3. Pulmonary HTN: -Echo pending -May benefit from RHC  4. Acute on CKD: -Stage  uncertain at this time as her renal function has been gradually getting worse over the prior 10 months -Per IM  5. Abnormal TSH: -Per IM   For questions or updates, please contact [facility] Please consult [email or web address] for contact info under Cardiology/STEMI.   Signed, Eula Listen, PA-C [facility] Pager: [phone] 11/04/2017, 10:54 AM

## 2017-11-04 NOTE — Consult Note (Signed)
Consultation Note Date: 11/04/2017   Patient Name: Jasmine Osborn  DOB: 08-25-1951  MRN: [id number]  Age / Sex: 67 y.o., female  PCP: Lauro Regulus, MD Referring Physician: Delfino Lovett, MD  Reason for Consultation: Establishing goals of care  HPI/Patient Profile: Jasmine Osborn  is a 67 y.o. female with a known history of obesity, peripheral vascular disease, hypertension, hyperlipidemia, diabetes, abdominal wall hernia, suspected sleep apnea who presents to the hospital due to lethargy and low O2 sats. Patient was seen by her primary care physician 2 days ago and diagnosed with a urinary tract infection and started on oral antibiotics with Omnicef.   Clinical Assessment and Goals of Care: Jasmine Osborn is resting in bed with husband and sister in law at bedside. She lives at home with her husband. She states she is able to do ADL's and move about the house without difficulty. She states she becomes SOB with walking long distances outside the house. Since her UTI diagnosis, per her husband, she was more lethargic, and he checked her O2 sat and it was in the 60's-80's, so he brought her here.   We discussed diagnosis, prognosis, and GOC.  A detailed discussion was had today regarding advanced directives.  Concepts specific to code status, artifical feeding and hydration,  IV antibiotics and rehospitalization was had. The difference between an aggressive medical intervention path and a comfort care path for this patient at this time was discussed.  Values and goals of care important to patient and family were attempted to be elicited.  She states she does not want chest compressions, shocks, or a breathing tube. She would not want a breathing tube or to be placed on a ventilator for respiratory distress. She states when it's my time it's my time.  She would never want a feeding tube or dialysis.      SUMMARY OF RECOMMENDATIONS   MOST form completed. DNR/DNI, section B with limited additional interventions. IVF and abx okay. BIPAP okay.    Family does not want palliative to follow outpatient as they state they see her doctors frequently, and struggle to find weeks where they do not have to see healthcare providers.    Code Status/Advance Care Planning:  DNR    Symptom Management:   Per primary team.   Palliative Prophylaxis:   Oral Care  Prognosis:   Unable to determine  Discharge Planning: To Be Determined      Primary Diagnoses: Present on Admission: . Acute on chronic respiratory failure with hypoxia and hypercapnia (HCC)   I have reviewed the medical record, interviewed the patient and family, and examined the patient. The following aspects are pertinent.  Past Medical History:  Diagnosis Date  . Depression   . Diabetes mellitus without complication (HCC)   . Elevated lipids   . Hernia of abdominal wall   . Hypertension   . Increased endometrial stripe thickness 06/04/2017  . Increased endometrial stripe thickness 06/04/2017  . Psychosis (HCC)   . PVD (peripheral vascular disease) (HCC)  heel ulcer Lt    Social History   Socioeconomic History  . Marital status: Married    Spouse name: None  . Number of children: None  . Years of education: None  . Highest education level: None  Social Needs  . Financial resource strain: None  . Food insecurity - worry: None  . Food insecurity - inability: None  . Transportation needs - medical: None  . Transportation needs - non-medical: None  Occupational History  . None  Tobacco Use  . Smoking status: Former Games developermoker  . Smokeless tobacco: Never Used  Substance and Sexual Activity  . Alcohol use: No  . Drug use: No  . Sexual activity: None  Other Topics Concern  . None  Social History Narrative  . None   Family History  Problem Relation Age of Onset  . Dementia Mother   . Valvular heart disease  Father    Scheduled Meds: . apixaban  5 mg Oral BID  . chlorhexidine  15 mL Mouth Rinse BID  . fenofibrate  160 mg Oral Daily  . insulin aspart  0-5 Units Subcutaneous QHS  . insulin aspart  0-9 Units Subcutaneous TID WC  . latanoprost  1 drop Both Eyes QHS  . mouth rinse  15 mL Mouth Rinse q12n4p  . metoprolol succinate  50 mg Oral Daily  . mirtazapine  30 mg Oral QHS  . nutrition supplement (JUVEN)  2 packet Oral BID BM   Continuous Infusions: PRN Meds:.acetaminophen **OR** [DISCONTINUED] acetaminophen, [DISCONTINUED] ondansetron **OR** ondansetron (ZOFRAN) IV Medications Prior to Admission:  Prior to Admission medications   Medication Sig Start Date End Date Taking? Authorizing Provider  amLODipine (NORVASC) 5 MG tablet Take 5 mg by mouth daily.   Yes [provider]  aspirin 81 MG tablet Take 81 mg by mouth daily.   Yes [provider]  cefdinir (OMNICEF) 300 MG capsule Take 300 mg by mouth 2 (two) times daily. 10/31/17 11/06/17 Yes [provider]  fenofibrate 160 MG tablet Take 160 mg by mouth daily.   Yes [provider]  KLOR-CON 10 10 MEQ tablet Take 20 mEq by mouth daily.  10/22/16  Yes [provider]  latanoprost (XALATAN) 0.005 % ophthalmic solution USE [facility] AT [facility]TIME 05/18/15  Yes [provider]  losartan (COZAAR) 50 MG tablet Take 50 mg by mouth daily.   Yes [provider]  megestrol (MEGACE) 40 MG tablet Take 80 mg by mouth 2 (two) times daily. 12/11/16  Yes [provider]  metFORMIN (GLUCOPHAGE) 500 MG tablet Take 500 mg by mouth 2 (two) times daily with a meal.   Yes [provider]  metoprolol succinate (TOPROL-XL) 50 MG 24 hr tablet Take 50 mg by mouth daily. 09/04/17  Yes [provider]  mirtazapine (REMERON) 30 MG tablet Take 30 mg by mouth at bedtime.   Yes [provider]  risperiDONE (RISPERDAL) 1 MG tablet Take 1 tablet (1 mg total) by mouth at  bedtime. Patient taking differently: Take 1.5 mg by mouth at bedtime.  01/03/17  Yes Enid BaasKalisetti, Radhika, MD  torsemide (DEMADEX) 20 MG tablet Take 20 mg by mouth daily.   Yes [provider]  metoprolol (LOPRESSOR) 50 MG tablet Take 1 tablet (50 mg total) by mouth 2 (two) times daily. Patient not taking: Reported on 11/02/2017 01/03/17   Enid BaasKalisetti, Radhika, MD   No Known Allergies Review of Systems  Respiratory: Positive for shortness of breath.  Physical Exam  Constitutional: No distress.  Pulmonary/Chest: Effort normal.  Neurological: She is alert.  Oriented    Vital Signs: BP (!) 101/47   Pulse 95   Temp 98.3 F (36.8 C) (Oral)   Resp (!) 25   Ht 5\' 4"  (1.626 m)   Wt 124 kg (273 lb 5.9 oz)   SpO2 92%   BMI 46.92 kg/m  Pain Assessment: No/denies pain       SpO2: SpO2: 92 % O2 Device:SpO2: 92 % O2 Flow Rate: .O2 Flow Rate (L/min): 2 L/min  IO: Intake/output summary:   Intake/Output Summary (Last 24 hours) at 11/04/2017 1205 Last data filed at 11/04/2017 0849 Gross per 24 hour  Intake 516.5 ml  Output 1050 ml  Net -533.5 ml    LBM: Last BM Date: 11/02/17 Baseline Weight: Weight: 124 kg (273 lb 5.9 oz) Most recent weight: Weight: 124 kg (273 lb 5.9 oz)     Palliative Assessment/Data: 60%     Time In: 11:25 Time Out: 12:15 Time Total: 50 min Greater than 50%  of this time was spent counseling and coordinating care related to the above assessment and plan.  Signed by: Morton Stall, NP   Please contact Palliative Medicine Team phone at [phone] for questions and concerns.  For individual provider: See Loretha Stapler

## 2017-11-04 NOTE — Progress Notes (Signed)
[facility] - [place] at [facility]   PATIENT NAME: Jasmine Osborn    MR#:  [id number]  DATE OF BIRTH:  1951-04-06  SUBJECTIVE:  PAF yesterday Off of Bipap on [place] 2-3 L  REVIEW OF SYSTEMS:  Review of Systems  Constitutional: Negative for chills, fever and weight loss.  HENT: Negative for nosebleeds and sore throat.   Eyes: Negative for blurred vision.  Respiratory: Positive for shortness of breath. Negative for cough and wheezing.   Cardiovascular: Negative for chest pain, orthopnea, leg swelling and PND.  Gastrointestinal: Negative for abdominal pain, constipation, diarrhea, heartburn, nausea and vomiting.  Genitourinary: Negative for dysuria and urgency.  Musculoskeletal: Negative for back pain.  Skin: Negative for rash.  Neurological: Negative for dizziness, speech change, focal weakness and headaches.  Endo/Heme/Allergies: Does not bruise/bleed easily.  Psychiatric/Behavioral: Negative for depression.    DRUG ALLERGIES:  No Known Allergies VITALS:  Blood pressure 109/64, pulse 88, temperature 98.6 F (37 C), temperature source Oral, resp. rate (!) 31, height 5\' 4"  (1.626 m), weight 124 kg (273 lb 5.9 oz), SpO2 97 %. PHYSICAL EXAMINATION:  Physical Exam  Constitutional: She is oriented to person, place, and time.  Morbidly obese  HENT:  Head: Normocephalic and atraumatic.  Eyes: Conjunctivae and EOM are normal. Pupils are equal, round, and reactive to light.  Neck: Normal range of motion. Neck supple. No tracheal deviation present. No thyromegaly present.  Cardiovascular: Normal rate, regular rhythm and normal heart sounds.  Pulmonary/Chest: Effort normal. No respiratory distress. She has no wheezes. She exhibits no tenderness.  ecreased breath sounds throughout lung fields  Abdominal: Soft. Bowel sounds are normal. She exhibits no distension. There is no tenderness.  Large abdominal hernia obese  Musculoskeletal: Normal range of motion.    Neurological: She is alert and oriented to person, place, and time. No cranial nerve deficit.  Skin: Skin is warm and dry. Rash noted. No erythema.  Psychiatric: Mood and affect normal.   LABORATORY PANEL:  Female CBC Recent Labs  Lab 11/04/17 0451  WBC 8.1  HGB 13.1  HCT 42.4  PLT 167   ------------------------------------------------------------------------------------------------------------------ Chemistries  Recent Labs  Lab 11/03/17 0505 11/04/17 0451  NA 139 140  K 4.2 4.5  CL 98* 100*  CO2 33* 32  GLUCOSE 85 103*  BUN 32* 39*  CREATININE 1.69* 1.88*  CALCIUM 8.9 8.8*  MG 2.1  --   AST  --  14*  ALT  --  9*  ALKPHOS  --  45  BILITOT  --  0.9   RADIOLOGY:  Dg Chest Port 1 View  Result Date: 11/04/2017 CLINICAL DATA:  67 year old female with hypoxia. Small pleural effusions and lung base atelectasis on recent chest CTA EXAM: PORTABLE CHEST 1 VIEW COMPARISON:  11/02/2017 chest CTA head earlier. FINDINGS: Portable AP upright view at 0526 hours. The patient is mildly rotated to the right. Continued bibasilar opacity, greater on the left. Stable mediastinal contours. No pneumothorax. No overt pulmonary edema. IMPRESSION: Ventilation appears stable to the CTA on 11/02/2017 with bilateral lung base opacity. Electronically Signed   By: Odessa Fleming M.D.   On: 11/04/2017 07:23   ASSESSMENT AND PLAN:  67 yo female w/ hx of Obesity, PVD, hypertension, history of abdominal wall hernia, diabetes, depression who presented to the hospital due to lethargy, low O2 sats and noted to be in acute on chronic respiratory failure with hypercapnia.  1. Acute on chronic respiratory failure with hypoxemia/hypercapnia-suspected to be secondary to obesity  pickwickian syndrome combined with underlying sleep apnea and may be some mild CHF. Patient has been transitioned from BiPAP to nasal cannula continue nocturnal BiPAP 2. Essential hypertension-continue metoprolol  3. Diabetes type 2 without  complication-continue sliding scale insulin. Hold metformin.  4. Glaucoma-continue latanoprost eyedrops.  5. Depression-continue Remeron, Risperdal.  6. BJY:NWGNPAF:this was precipitated by current illness. Continue metoprolol Her CHADSVASC score is at least 5;  Start apixaban 5 mg twice daily as recommended by cardiology Follow-up echocardiogram  7. Acute kidney injury: Repeat BMP in a.m. Hold off on nephrotoxic medications and diuresis for now  8. Hyperlipidemia: Continue fenofibrate   She will need PT consultation for discharge planning    All the records are reviewed and case discussed with Care Management/Social Worker. Management plans discussed with the patient, nursing and they are in agreement.  CODE STATUS: DNR  TOTAL TIME TAKING CARE OF THIS PATIENT: 18 minutes.   More than 50% of the time was spent in counseling/coordination of care: YES  POSSIBLE D/C IN 2-3 DAYS, DEPENDING ON CLINICAL CONDITION.   Roena Sassaman M.D on 11/04/2017 at 2:57 PM  Between 7am to 6pm - Pager - [phone]  After 6pm go to [email or web address] - Scientist, research (life sciences)password [email or web address]  [facility] [place] [facility]  Office  [phone]  CC: Primary care physician; [name], Marshall W, MD  Note: This dictation was prepared with Dragon dictation along with smaller phrase technology. Any transcriptional errors that result from this process are unintentional.

## 2017-11-04 NOTE — Progress Notes (Signed)
*  PRELIMINARY RESULTS* Echocardiogram 2D Echocardiogram has been performed.  Jasmine GulaJoan M Zandrea Osborn 11/04/2017, 11:44 AM

## 2017-11-05 DIAGNOSIS — I4819 Other persistent atrial fibrillation: Secondary | ICD-10-CM

## 2017-11-05 DIAGNOSIS — J9621 Acute and chronic respiratory failure with hypoxia: Principal | ICD-10-CM

## 2017-11-05 DIAGNOSIS — I481 Persistent atrial fibrillation: Secondary | ICD-10-CM

## 2017-11-05 DIAGNOSIS — R0602 Shortness of breath: Secondary | ICD-10-CM

## 2017-11-05 DIAGNOSIS — J9622 Acute and chronic respiratory failure with hypercapnia: Secondary | ICD-10-CM

## 2017-11-05 DIAGNOSIS — R0902 Hypoxemia: Secondary | ICD-10-CM

## 2017-11-05 LAB — T4, FREE: Free T4: 0.89 ng/dL (ref 0.61–1.12)

## 2017-11-05 LAB — BASIC METABOLIC PANEL
Anion gap: 6 (ref 5–15)
BUN: 27 mg/dL — AB (ref 6–20)
CO2: 32 mmol/L (ref 22–32)
CREATININE: 1.39 mg/dL — AB (ref 0.44–1.00)
Calcium: 9.1 mg/dL (ref 8.9–10.3)
Chloride: 103 mmol/L (ref 101–111)
GFR calc Af Amer: 45 mL/min — ABNORMAL LOW (ref >60)
GFR calc non Af Amer: 39 mL/min — ABNORMAL LOW (ref >60)
Glucose, Bld: 91 mg/dL (ref 65–99)
Potassium: 4.1 mmol/L (ref 3.5–5.1)
Sodium: 141 mmol/L (ref 135–145)

## 2017-11-05 LAB — GLUCOSE, CAPILLARY
GLUCOSE-CAPILLARY: 144 mg/dL — AB (ref 65–99)
Glucose-Capillary: 105 mg/dL — ABNORMAL HIGH (ref 65–99)
Glucose-Capillary: 75 mg/dL (ref 65–99)
Glucose-Capillary: 134 mg/dL — ABNORMAL HIGH (ref 65–99)
Glucose-Capillary: 123 mg/dL — ABNORMAL HIGH (ref 65–99)

## 2017-11-05 LAB — CBC
HCT: 41.1 % (ref 35.0–47.0)
Hemoglobin: 12.7 g/dL (ref 12.0–16.0)
MCH: 27.3 pg (ref 26.0–34.0)
MCHC: 30.9 g/dL — AB (ref 32.0–36.0)
MCV: 88.3 fL (ref 80.0–100.0)
PLATELETS: 135 10*3/uL — AB (ref 150–440)
RBC: 4.66 MIL/uL (ref 3.80–5.20)
RDW: 17.6 % — AB (ref 11.5–14.5)
WBC: 5.6 10*3/uL (ref 3.6–11.0)

## 2017-11-05 MED ORDER — MUPIROCIN 2 % EX OINT
TOPICAL_OINTMENT | Freq: Every day | CUTANEOUS | Status: DC
  Administered 2017-11-05 – 2017-11-08 (×4): via TOPICAL
  Filled 2017-11-05: qty 22

## 2017-11-05 MED ORDER — LOSARTAN POTASSIUM 25 MG PO TABS
25.0000 mg | ORAL_TABLET | Freq: Every day | ORAL | Status: DC
  Administered 2017-11-05 – 2017-11-08 (×4): 25 mg via ORAL
  Filled 2017-11-05 (×4): qty 1

## 2017-11-05 MED ORDER — LOSARTAN POTASSIUM 25 MG PO TABS: 25.0000 mg | ORAL_TABLET | Freq: Every day | ORAL | Status: DC

## 2017-11-05 MED ORDER — FUROSEMIDE 10 MG/ML IJ SOLN
20.0000 mg | Freq: Every day | INTRAMUSCULAR | Status: DC
  Administered 2017-11-05 – 2017-11-08 (×4): 20 mg via INTRAVENOUS
  Filled 2017-11-05 (×4): qty 2

## 2017-11-05 NOTE — Progress Notes (Signed)
Pt has been ordered overnight oximetry therefore she is not wearing the Bipap tonight. She is currently on 3L [place].

## 2017-11-05 NOTE — Progress Notes (Signed)
[facility] - [place] at [facility]   PATIENT NAME: Charlyne PetrinDarlene Cake    MR#:  [id number]  DATE OF BIRTH:  04/10/1951  SUBJECTIVE:  Doing better, off BiPAP  REVIEW OF SYSTEMS:  Review of Systems  Constitutional: Negative for chills, fever and weight loss.  HENT: Negative for nosebleeds and sore throat.   Eyes: Negative for blurred vision.  Respiratory: Positive for shortness of breath. Negative for cough and wheezing.   Cardiovascular: Negative for chest pain, orthopnea, leg swelling and PND.  Gastrointestinal: Negative for abdominal pain, constipation, diarrhea, heartburn, nausea and vomiting.  Genitourinary: Negative for dysuria and urgency.  Musculoskeletal: Negative for back pain.  Skin: Negative for rash.  Neurological: Negative for dizziness, speech change, focal weakness and headaches.  Endo/Heme/Allergies: Does not bruise/bleed easily.  Psychiatric/Behavioral: Negative for depression.   DRUG ALLERGIES:  No Known Allergies VITALS:  Blood pressure (!) 142/48, pulse 90, temperature 98.7 F (37.1 C), temperature source Oral, resp. rate 19, height 5\' 4"  (1.626 m), weight 124 kg (273 lb 5.9 oz), SpO2 95 %. PHYSICAL EXAMINATION:  Physical Exam  Constitutional: She is oriented to person, place, and time.  Morbidly obese  HENT:  Head: Normocephalic and atraumatic.  Eyes: Conjunctivae and EOM are normal. Pupils are equal, round, and reactive to light.  Neck: Normal range of motion. Neck supple. No tracheal deviation present. No thyromegaly present.  Cardiovascular: Normal rate, regular rhythm and normal heart sounds.  Pulmonary/Chest: Effort normal. No respiratory distress. She has no wheezes. She exhibits no tenderness.  ecreased breath sounds throughout lung fields  Abdominal: Soft. Bowel sounds are normal. She exhibits no distension. There is no tenderness.  Large abdominal hernia obese  Musculoskeletal: Normal range of motion.  Neurological: She is  alert and oriented to person, place, and time. No cranial nerve deficit.  Skin: Skin is warm and dry. Rash noted. No erythema.  Psychiatric: Mood and affect normal.   LABORATORY PANEL:  Female CBC Recent Labs  Lab 11/05/17 0457  WBC 5.6  HGB 12.7  HCT 41.1  PLT 135*   ------------------------------------------------------------------------------------------------------------------ Chemistries  Recent Labs  Lab 11/03/17 0505 11/04/17 0451 11/05/17 0457  NA 139 140 141  K 4.2 4.5 4.1  CL 98* 100* 103  CO2 33* 32 32  GLUCOSE 85 103* 91  BUN 32* 39* 27*  CREATININE 1.69* 1.88* 1.39*  CALCIUM 8.9 8.8* 9.1  MG 2.1  --   --   AST  --  14*  --   ALT  --  9*  --   ALKPHOS  --  45  --   BILITOT  --  0.9  --    RADIOLOGY:  No results found. ASSESSMENT AND PLAN:  67 yo female w/ hx of Obesity, PVD, hypertension, history of abdominal wall hernia, diabetes, depression who presented to the hospital due to lethargy, low O2 sats and noted to be in acute on chronic respiratory failure with hypercapnia.  1. Acute on chronic respiratory failure with hypoxemia/hypercapnia-suspected to be secondary to obesity pickwickian syndrome combined with underlying sleep apnea and may be some mild CHF. Patient has been transitioned from BiPAP to nasal cannula continue nocturnal BiPAP  2. Essential hypertension-continue metoprolol  3. Diabetes type 2 without complication-continue sliding scale insulin. Hold metformin.  4. Glaucoma-continue latanoprost eyedrops.  5. Depression-continue Remeron, Risperdal.  6. NWG:NFAOPAF:this was precipitated by current illness. Continue metoprolol - continue apixaban 5 mg twice daily as recommended by cardiology - echocardiogram shows normal LV function  7. Acute kidney injury:  Hold off on nephrotoxic medications and diuresis for now  8. Hyperlipidemia: Continue fenofibrate   PT recommends HHPT    All the records are reviewed and case discussed with Care  Management/Social Worker. Management plans discussed with the patient, nursing and they are in agreement.  CODE STATUS: DNR  TOTAL TIME TAKING CARE OF THIS PATIENT: 18 minutes.   More than 50% of the time was spent in counseling/coordination of care: YES  POSSIBLE D/C IN 1-2 DAYS, DEPENDING ON CLINICAL CONDITION.   Delfino Lovett M.D on 11/05/2017 at 6:31 PM  Between 7am to 6pm - Pager - [phone]  After 6pm go to [email or web address] - Scientist, research (life sciences) [place] [facility]  Office  [phone]  CC: Primary care physician; Lauro Regulus, MD  Note: This dictation was prepared with Dragon dictation along with smaller phrase technology. Any transcriptional errors that result from this process are unintentional.

## 2017-11-05 NOTE — Progress Notes (Signed)
Pt being transferred to room 246. Report called to Alan Ripperlaire, Charity fundraiserN. Pt and belongings transferred to room 246 without incident.

## 2017-11-05 NOTE — Progress Notes (Signed)
Patient is floor status but remains in ICU/SDU due to lack of MedSurg beds.  I did not see her officially today.  However, I did note that her creatinine has improved and I resumed losartan at a reduced dose.  After transfer, PCCM will sign off. Please call if we can be of further assistance   Billy Fischeravid Tiannah Greenly, MD PCCM service Mobile [phone] Pager [phone] 11/05/2017 5:06 PM

## 2017-11-05 NOTE — Consult Note (Addendum)
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  Sees Dr Graciela HusbandsKlein for paring of the callous DTI (Deep tissue injury) to coccyx Wound type:neuropathic/pressure Pressure Injury POA: Yes Measurement:left heel:  2 lesions:  Proximal 1 cm x 0.5 cm x0.1 cm pink and moist Distal:  2 cm x 1 cm callous dry and intact Coccyx:  2 cm x 1 cm maroon discoloration.  Will offload and apply sacral prophylactic foam dressing Wound AVW:UJWJbed:pink and moist Drainage (amount, consistency, odor) scant weeping Periwound:calloused tissus Dressing procedure/placement/frequency:Bactroban daily to nonintact lesion. Cover both with dry dressing daily Silicone foam to sacrococcygeal area Will not follow at this time.  Please re-consult if needed.  Maple HudsonKaren Jameia Makris RN BSN CWON Pager [phone]

## 2017-11-05 NOTE — Care Management (Signed)
Patient transfered out of icu this afternoon.  Current need for oxygen is acute.  Patient had admission last March for similar sx.  At that time an attempted was made to perform overnight pulse oximetry on room air but patient could not tolerate as her oxygen sats dropped into the 80's within minutes.  It is documented that her hypoxia and hypercapnia is suspected to be due to obesity pickwickian syndrome combined with underlying sleep apnea and possible CHF.  The is no documentation indicating  patient has COPD.  Physical therapy  has recommendation that patient's current functional rehab needs can be met in the home with home health.  CM will reach out to patient's husband to assure patient will have adequate in home support. It has been documented need possible need for overnight oximetry. Reached out to attending to inquire.  Will also asked staff to perform home 02 assessment.  Patient was to have been referred last march 2018 for sleep study- etc.  Will investigate to to whether this was pursued.  Will provide patient with 30 day Eliquis coupon

## 2017-11-05 NOTE — Progress Notes (Signed)
Progress Note  Patient Name: Jasmine Osborn Date of Encounter: 11/05/2017  Primary Cardiologist: new to [facility] - consult by End   Subjective   Sleepy this morning. On BiPAP overnight. Renal function improving. Repeat CXR showed bilateral lung base opacity, stable when compared to CTA. Remains in sinus rhythm with heart rates in the 80s bpm. Echo showed EF 65-70%, mildly dilated LV, Gr1DD, elevated CVP.   Inpatient Medications    Scheduled Meds: . apixaban  5 mg Oral BID  . chlorhexidine  15 mL Mouth Rinse BID  . fenofibrate  160 mg Oral Daily  . insulin aspart  0-5 Units Subcutaneous QHS  . insulin aspart  0-9 Units Subcutaneous TID WC  . latanoprost  1 drop Both Eyes QHS  . mouth rinse  15 mL Mouth Rinse q12n4p  . metoprolol succinate  50 mg Oral Daily  . mirtazapine  30 mg Oral QHS  . nutrition supplement (JUVEN)  2 packet Oral BID BM   Continuous Infusions:  PRN Meds: acetaminophen **OR** [DISCONTINUED] acetaminophen, [DISCONTINUED] ondansetron **OR** ondansetron (ZOFRAN) IV   Vital Signs    Vitals:   11/04/17 2200 11/05/17 0346 11/05/17 0353 11/05/17 0603  BP: 110/82 (!) 130/39 (!) 131/54 120/67  Pulse: 90 87 87 90  Resp: (!) 29 (!) 26 (!) 28 (!) 27  Temp:      TempSrc:      SpO2: 95% 95% 95% 96%  Weight:      Height:        Intake/Output Summary (Last 24 hours) at 11/05/2017 0721 Last data filed at 11/05/2017 0300 Gross per 24 hour  Intake 240 ml  Output 825 ml  Net -585 ml   Filed Weights   11/02/17 1728  Weight: 273 lb 5.9 oz (124 kg)    Telemetry    NSR, 80s bpm - Personally Reviewed  ECG    n/a - Personally Reviewed  Physical Exam   GEN: No acute distress.   Neck: JVD difficult to assess 2/2 body habitus. Cardiac: RRR, no murmurs, rubs, or gallops.  Respiratory: Diminished bilaterally.  GI: Soft, nontender, non-distended.   MS: 1+ bilateral pitting edema to the mid shins (improved); No deformity. Neuro:  Somnolent; Nonfocal.    Psych: Somnolent.  Labs    Chemistry Recent Labs  Lab 11/02/17 1204 11/03/17 0505 11/04/17 0451 11/05/17 0457  NA 137 139 140 141  K 4.3 4.2 4.5 4.1  CL 97* 98* 100* 103  CO2 33* 33* 32 32  GLUCOSE 162* 85 103* 91  BUN 32* 32* 39* 27*  CREATININE 1.63* 1.69* 1.88* 1.39*  CALCIUM 8.9 8.9 8.8* 9.1  PROT 7.1  --  6.7  --   ALBUMIN 3.8  --  3.4*  --   AST 17  --  14*  --   ALT 11*  --  9*  --   ALKPHOS 52  --  45  --   BILITOT 0.7  --  0.9  --   GFRNONAA 32* 30* 27* 39*  GFRAA 37* 35* 31* 45*  ANIONGAP 7 8 8 6      Hematology Recent Labs  Lab 11/03/17 0505 11/04/17 0451 11/05/17 0457  WBC 8.0 8.1 5.6  RBC 4.77 4.90 4.66  HGB 12.9 13.1 12.7  HCT 41.9 42.4 41.1  MCV 88.0 86.6 88.3  MCH 27.1 26.7 27.3  MCHC 30.8* 30.9* 30.9*  RDW 18.2* 18.3* 17.6*  PLT 171 167 135*    Cardiac Enzymes Recent Labs  Lab 11/02/17 1204  TROPONINI <0.03   No results for input(s): TROPIPOC in the last 168 hours.   BNP Recent Labs  Lab 11/02/17 1204  BNP 1,005.0*     DDimer No results for input(s): DDIMER in the last 168 hours.   Radiology    Dg Chest Port 1 View  Result Date: 11/04/2017 IMPRESSION: Ventilation appears stable to the CTA on 11/02/2017 with bilateral lung base opacity. Electronically Signed   By: Odessa Fleming M.D.   On: 11/04/2017 07:23    Cardiac Studies   TTE 12/2016: Study Conclusions  - Left ventricle: The cavity size was normal. There was mild concentric hypertrophy. Systolic function was normal. The estimated ejection fraction was in the range of 60% to 65%. The study is not technically sufficient to allow evaluation of LV diastolic function. - Left atrium: The atrium was mildly dilated. - Pulmonary arteries: Systolic pressure was moderately increased. PA peak pressure: 50 mm Hg (S).   TTE 11/04/17: Study Conclusions  - Procedure narrative: Transthoracic echocardiography. Image   quality was suboptimal. The study was technically  difficult, as a   result of poor acoustic windows. Intravenous contrast (Definity)   was administered. - Left ventricle: The cavity size was mildly dilated. Wall   thickness was at the upper limits of normal. Systolic function   was vigorous. The estimated ejection fraction was in the range of   65% to 70%. Doppler parameters are consistent with abnormal left   ventricular relaxation (grade 1 diastolic dysfunction). Doppler   parameters are consistent with high ventricular filling pressure. - Mitral valve: Mildly calcified annulus. - Right ventricle: The cavity size was mildly dilated. Systolic   function was normal. - Inferior vena cava: The vessel was dilated. The respirophasic   diameter changes were blunted (< 50%), consistent with elevated   central venous pressure. - Pericardium, extracardiac: A trivial pericardial effusion was   identified.  Patient Profile     67 y.o. female with history of chronic respiratory failure felt to be 2/2 OHS/OSA, pulmonary hypertension, HTN, HLD, DM, morbid obesity, and depression who is being seen today for the evaluation of Afib with RVR at the request of Dr. Sherryll Burger.  Assessment & Plan    1. New onset Afib with RVR: -Converted to sinus rhythm on 1/29 on amiodarone infusion -Amiodarone discontinued on 1/29 -Continue Toprol XL, titrate as needed -Continue Eliquis 5 mg bid given a CHADS2VASC of at least 5 (HTN, age x 1, DM, vascular disease, female) -Likely in the setting of her acute illness -TSH elevated, recommend follow up by IM -Maintain lytes at goal  2. Acute on chronic respiratory failure with hypoxia and hypercapnia: -Felt to be multifactorial including morbid obesity with OHS/OSA and pulmonary HTN -Per PCCM  3. Pulmonary HTN: -Echo showing elevated CVP -Gentle IV diuresis as renal function improved -May benefit from RHC  4. Acute on CKD: -Stage uncertain at this time as her renal function has been gradually getting worse over  the prior 10 months -Improving -Per IM  5. Abnormal TSH: -Per IM    For questions or updates, please contact [facility] Please consult [email or web address] for contact info under Cardiology/STEMI.    Signed, Eula Listen, PA-C [facility] Pager: [phone] 11/05/2017, 7:21 AM   Attending Note Patient seen and examined, agree with detailed note above,  Patient presentation and plan discussed on rounds.   EKG lab work, chest x-ray, echocardiogram, CT chest  reviewed independently by myself  Continued shortness of  breath this morning, saturations 85-88% on 2 L nasal cannula Denies any distress In bed supine, Symptoms seem to get worse last Friday approximately 5-6 days ago Maintaining normal sinus rhythm on telemetry  On physical exam morbidly obese, no distress, unable to estimate JVP, lungs with rales at the bases otherwise clear, heart sounds regular no significant murmurs appreciated abdomen morbidly obese soft nontender hernia left lower quadrant nontender no significant lower extremity edema  Lab work reviewed showing creatinine 1.39 BUN 27, hematocrit 41 BMP from January 27 was 1000  Echocardiogram with normal LV function  A/P:  1. New onset Afib with RVR:  sinus rhythm on 1/29 on amiodarone infusion -Continue Toprol XL, Eliquis 5 mg bid  CHADS2VASC of at least 5 (HTN, age x 1, DM, vascular disease, female)  2. Acute on chronic respiratory failure with hypoxia and hypercapnia: multifactorial including morbid obesity /OHS/OSA  Will likely require BiPAP at home Gentle diuresis If no significant improvement could consider right heart catheterization to guide diuresis  3. Acute on CKD:  Stable  4.  Morbid obesity Likely contributing to presentation  Long discussion with family at the bedside Greater than 50% was spent in counseling and coordination of care with patient Total encounter time 35 minutes or more   Signed: Dossie Arbour  M.D., Ph.D. [facility]

## 2017-11-06 ENCOUNTER — Other Ambulatory Visit: Payer: Self-pay

## 2017-11-06 DIAGNOSIS — E872 Acidosis: Secondary | ICD-10-CM

## 2017-11-06 LAB — GLUCOSE, CAPILLARY
GLUCOSE-CAPILLARY: 108 mg/dL — AB (ref 65–99)
GLUCOSE-CAPILLARY: 155 mg/dL — AB (ref 65–99)
Glucose-Capillary: 125 mg/dL — ABNORMAL HIGH (ref 65–99)

## 2017-11-06 LAB — CBC
HCT: 41.2 % (ref 35.0–47.0)
Hemoglobin: 12.7 g/dL (ref 12.0–16.0)
MCH: 27.1 pg (ref 26.0–34.0)
MCHC: 30.9 g/dL — AB (ref 32.0–36.0)
MCV: 87.7 fL (ref 80.0–100.0)
Platelets: 164 10*3/uL (ref 150–440)
RBC: 4.7 MIL/uL (ref 3.80–5.20)
RDW: 17.8 % — ABNORMAL HIGH (ref 11.5–14.5)
WBC: 6.8 10*3/uL (ref 3.6–11.0)

## 2017-11-06 LAB — BASIC METABOLIC PANEL
ANION GAP: 9 (ref 5–15)
BUN: 22 mg/dL — ABNORMAL HIGH (ref 6–20)
CALCIUM: 9.4 mg/dL (ref 8.9–10.3)
CO2: 31 mmol/L (ref 22–32)
Chloride: 101 mmol/L (ref 101–111)
Creatinine, Ser: 1.17 mg/dL — ABNORMAL HIGH (ref 0.44–1.00)
GFR calc Af Amer: 55 mL/min — ABNORMAL LOW (ref >60)
GFR calc non Af Amer: 47 mL/min — ABNORMAL LOW (ref >60)
GLUCOSE: 105 mg/dL — AB (ref 65–99)
Potassium: 4.3 mmol/L (ref 3.5–5.1)
SODIUM: 141 mmol/L (ref 135–145)

## 2017-11-06 LAB — BLOOD GAS, ARTERIAL
ACID-BASE EXCESS: 13 mmol/L — AB (ref 0.0–2.0)
Bicarbonate: 41.6 mmol/L — ABNORMAL HIGH (ref 20.0–28.0)
FIO2: 32
O2 Saturation: 68.7 %
PCO2 ART: 72 mmHg — AB (ref 32.0–48.0)
PH ART: 7.37 (ref 7.350–7.450)
Patient temperature: 37
pO2, Arterial: 37 mmHg — CL (ref 83.0–108.0)

## 2017-11-06 LAB — T3, FREE: T3 FREE: 1.8 pg/mL — AB (ref 2.0–4.4)

## 2017-11-06 MED ORDER — OCUVITE-LUTEIN PO CAPS
1.0000 | ORAL_CAPSULE | Freq: Every day | ORAL | Status: DC
  Administered 2017-11-06 – 2017-11-08 (×3): 1 via ORAL
  Filled 2017-11-06 (×3): qty 1

## 2017-11-06 MED ORDER — PREMIER PROTEIN SHAKE
11.0000 [oz_av] | Freq: Two times a day (BID) | ORAL | Status: DC
  Administered 2017-11-06 – 2017-11-08 (×4): 11 [oz_av] via ORAL

## 2017-11-06 NOTE — Progress Notes (Signed)
md shah was notifiied that pt is sleepy and did not wear bipap last night.

## 2017-11-06 NOTE — Progress Notes (Signed)
MD Salary was read blood gas results. Pulse ox was 98% on 3 L. Pt answers questions appropriately but still very sleepy. Jasmine Osborn RT was requested to reapply bipap for the night since pt is already sleeping.

## 2017-11-06 NOTE — Progress Notes (Signed)
Physical Therapy Treatment Patient Details Name: Jasmine Osborn MRN: [id number] DOB: Feb 22, 1951 Today's Date: 11/06/2017    History of Present Illness 67 y.o. female with a known history of obesity, peripheral vascular disease, hypertension, hyperlipidemia, diabetes, abdominal wall hernia, suspected sleep apnea who presents to the hospital due to lethargy and low O2 sats.  Recently diagnosed with UTI.      PT Comments    Chart reviewed.  Upon arrival to room pt's husband stated she is more lethargic today.  Difficulty staying awake.  Pt on nasal O2 with sats 94-96%.  Started session with supine exercises to try to awaken pt for mobility skills.  Pt with poor assistance with exercises and falls asleep if not given constant verbal and tactile cues.  Tried to engage pt in conversation to awaken but again she drifts off to sleep.  She was unable to assist with repositioning in bed or attempts at transitioning to edge of bed.  Max assist to reposition in bed.    HHPT was originally recommended for discharge plan based on continued progression of mobility skills.  As pt was unable to effectively participate today, recommendation was changed to SNF.  Will continue with current plan of care to increase functional mobility and strength.   Follow Up Recommendations  SNF     Equipment Recommendations       Recommendations for Other Services       Precautions / Restrictions Precautions Precautions: Fall Restrictions Weight Bearing Restrictions: No    Mobility  Bed Mobility               General bed mobility comments: unable to acess due to lethargy  Transfers                    Ambulation/Gait                 Stairs            Wheelchair Mobility    Modified Rankin (Stroke Patients Only)       Balance                                            Cognition Arousal/Alertness: Lethargic Behavior During Therapy: WFL for tasks  assessed/performed Overall Cognitive Status: Difficult to assess                                 General Comments: Pt husband reports increased lethargy.  Noted during session.        Exercises Other Exercises Other Exercises: BLE AAROM in attempts to awaken pt for mobility skills.  ankle pumps, heel slides, SLR and ab/add x 10 bilaterally.  Needed constant verbal and tactile cues to prevent falling asleep.  very little assist from pt for exercises.    General Comments        Pertinent Vitals/Pain Pain Assessment: No/denies pain    Home Living                      Prior Function            PT Goals (current goals can now be found in the care plan section) Progress towards PT goals: Not progressing toward goals - comment    Frequency    Min 2X/week  PT Plan Discharge plan needs to be updated    Co-evaluation              AM-PAC PT "6 Clicks" Daily Activity  Outcome Measure  Difficulty turning over in bed (including adjusting bedclothes, sheets and blankets)?: Unable Difficulty moving from lying on back to sitting on the side of the bed? : Unable Difficulty sitting down on and standing up from a chair with arms (e.g., wheelchair, bedside commode, etc,.)?: Unable Help needed moving to and from a bed to chair (including a wheelchair)?: Total Help needed walking in hospital room?: Total Help needed climbing 3-5 steps with a railing? : Total 6 Click Score: 6    End of Session Equipment Utilized During Treatment: Oxygen Activity Tolerance: Patient limited by lethargy Patient left: in bed;with bed alarm set;with call bell/phone within reach;with family/visitor present Nurse Communication: Other (comment)       Time: 0981-1914: 1248-1305 PT Time Calculation (min) (ACUTE ONLY): 17 min  Charges:  $Therapeutic Exercise: 8-22 mins                    G Codes:       Danielle DessSarah Dmoni Fortson, PTA 11/06/17, 1:13 PM

## 2017-11-06 NOTE — Progress Notes (Signed)
Nutrition Follow Up Note  DOCUMENTATION CODES:   Morbid obesity  INTERVENTION:   Add Premier Protein BID, each supplement provides 160 kcal and 30 grams of protein.   Add Ocuvite daily for wound healing (provides zinc, vitamin A, vitamin C, Vitamin E, copper, and selenium)  Continue Juven 2 pkts BID, each supplement provides 80 calories and 14g amino acids  Liberalize the fat restriction from diet as this restricts protein also  NUTRITION DIAGNOSIS:   Increased nutrient needs related to wound healing as evidenced by increased estimated needs from protein.  GOAL:   Patient will meet greater than or equal to 90% of their needs  MONITOR:   PO intake, I & O's, Labs, Supplement acceptance, Weight trends    ASSESSMENT:   Jasmine PetrinDarlene Soter  is a 67 y.o. female with a known history of obesity, peripheral vascular disease, hypertension, hyperlipidemia, diabetes, abdominal wall hernia, suspected sleep apnea who presents to the hospital due to lethargy and low O2 sats. Found to have acute on chronic respiratory failure, obesity hypoventilation syndrome, R pleural effusion.  Pt with improved appetite and oral intake; eating 100% of meals and drinking some Juven. RD will add Premier Protein and Ocuvite to encourage wound healing. No new weight since 1/27; pt weight stable pta.    Diet Order:  Diet Carb Modified Fluid consistency: Thin; Room service appropriate? Yes  EDUCATION NEEDS:   Education needs have been addressed  Skin:  Stage I: to buttocks, Stage III: to heel  Last BM:  1/30- type 5   Height:   Ht Readings from Last 1 Encounters:  11/02/17 5\' 4"  (1.626 m)    Weight:   Wt Readings from Last 1 Encounters:  11/02/17 273 lb 5.9 oz (124 kg)    Ideal Body Weight:  54.54 kg  BMI:  Body mass index is 46.92 kg/m.  Estimated Nutritional Needs:   Kcal:  2200-2500 calories  Protein:  109-136 grams  Fluid:  Per MD  Betsey Holidayasey Zaylyn Bergdoll MS, RD, LDN Pager #[phone]-  [phone] After Hours Pager: [phone]

## 2017-11-06 NOTE — Progress Notes (Signed)
Progress Note  Patient Name: Jasmine Osborn Date of Encounter: 11/06/2017  Primary Cardiologist: new to [facility] - consult by End  Subjective   Less SOB. On nasal cannula. Did not wear BiPAP overnight as overnight oximetry was ordered. No chest pain or palpitations. Minus 2.2 L for the past 24 hours and net - 5 L for the admission. No weights. BP stable. Transferred to 2A on 1/30. Labs pending this morning.   Inpatient Medications    Scheduled Meds: . apixaban  5 mg Oral BID  . chlorhexidine  15 mL Mouth Rinse BID  . fenofibrate  160 mg Oral Daily  . furosemide  20 mg Intravenous Daily  . insulin aspart  0-5 Units Subcutaneous QHS  . insulin aspart  0-9 Units Subcutaneous TID WC  . latanoprost  1 drop Both Eyes QHS  . losartan  25 mg Oral Daily  . mouth rinse  15 mL Mouth Rinse q12n4p  . metoprolol succinate  50 mg Oral Daily  . mirtazapine  30 mg Oral QHS  . mupirocin ointment   Topical Daily  . nutrition supplement (JUVEN)  2 packet Oral BID BM   Continuous Infusions:  PRN Meds: acetaminophen **OR** [DISCONTINUED] acetaminophen, [DISCONTINUED] ondansetron **OR** ondansetron (ZOFRAN) IV   Vital Signs    Vitals:   11/05/17 1736 11/05/17 2059 11/06/17 0517 11/06/17 0732  BP: (!) 142/48 (!) 154/65 (!) 152/69 (!) 114/43  Pulse: 90 94 (!) 101 93  Resp: 19 18 18 18   Temp: 98.7 F (37.1 C) 98 F (36.7 C) 98.6 F (37 C)   TempSrc: Oral Oral Oral   SpO2: 95% 91% 91% 94%  Weight:      Height:        Intake/Output Summary (Last 24 hours) at 11/06/2017 0752 Last data filed at 11/05/2017 2349 Gross per 24 hour  Intake -  Output 2200 ml  Net -2200 ml   Filed Weights   11/02/17 1728  Weight: 273 lb 5.9 oz (124 kg)    Telemetry    NSR, short run of narrow complex tachycardia - Personally Reviewed  ECG    n/a - Personally Reviewed  Physical Exam   GEN: No acute distress.   Neck: JVD difficult to assess 2/2 body habitus. Cardiac: RRR, no murmurs, rubs, or  gallops.  Respiratory: Diminished bilaterally.  GI: Soft, nontender, non-distended.   MS: Trace bilateral pre-tibial edema; No deformity. Neuro:  Alert and oriented x 3; Nonfocal.  Psych: Normal affect.  Labs    Chemistry Recent Labs  Lab 11/02/17 1204 11/03/17 0505 11/04/17 0451 11/05/17 0457  NA 137 139 140 141  K 4.3 4.2 4.5 4.1  CL 97* 98* 100* 103  CO2 33* 33* 32 32  GLUCOSE 162* 85 103* 91  BUN 32* 32* 39* 27*  CREATININE 1.63* 1.69* 1.88* 1.39*  CALCIUM 8.9 8.9 8.8* 9.1  PROT 7.1  --  6.7  --   ALBUMIN 3.8  --  3.4*  --   AST 17  --  14*  --   ALT 11*  --  9*  --   ALKPHOS 52  --  45  --   BILITOT 0.7  --  0.9  --   GFRNONAA 32* 30* 27* 39*  GFRAA 37* 35* 31* 45*  ANIONGAP 7 8 8 6      Hematology Recent Labs  Lab 11/03/17 0505 11/04/17 0451 11/05/17 0457  WBC 8.0 8.1 5.6  RBC 4.77 4.90 4.66  HGB 12.9  13.1 12.7  HCT 41.9 42.4 41.1  MCV 88.0 86.6 88.3  MCH 27.1 26.7 27.3  MCHC 30.8* 30.9* 30.9*  RDW 18.2* 18.3* 17.6*  PLT 171 167 135*    Cardiac Enzymes Recent Labs  Lab 11/02/17 1204  TROPONINI <0.03   No results for input(s): TROPIPOC in the last 168 hours.   BNP Recent Labs  Lab 11/02/17 1204  BNP 1,005.0*     DDimer No results for input(s): DDIMER in the last 168 hours.   Radiology    No results found.  Cardiac Studies   TTE 12/2016: Study Conclusions  - Left ventricle: The cavity size was normal. There was mild concentric hypertrophy. Systolic function was normal. The estimated ejection fraction was in the range of 60% to 65%. The study is not technically sufficient to allow evaluation of LV diastolic function. - Left atrium: The atrium was mildly dilated. - Pulmonary arteries: Systolic pressure was moderately increased. PA peak pressure: 50 mm Hg (S).   TTE 11/04/17: Study Conclusions  - Procedure narrative: Transthoracic echocardiography. Image quality was suboptimal. The study was technically  difficult, as a result of poor acoustic windows. Intravenous contrast (Definity) was administered. - Left ventricle: The cavity size was mildly dilated. Wall thickness was at the upper limits of normal. Systolic function was vigorous. The estimated ejection fraction was in the range of 65% to 70%. Doppler parameters are consistent with abnormal left ventricular relaxation (grade 1 diastolic dysfunction). Doppler parameters are consistent with high ventricular filling pressure. - Mitral valve: Mildly calcified annulus. - Right ventricle: The cavity size was mildly dilated. Systolic function was normal. - Inferior vena cava: The vessel was dilated. The respirophasic diameter changes were blunted (<50%), consistent with elevated central venous pressure. - Pericardium, extracardiac: A trivial pericardial effusion was identified.   Patient Profile     67 y.o. female with history of chronic respiratory failure felt to be 2/2 OHS/OSA, pulmonary hypertension, HTN, HLD, DM, morbid obesity, and depressionwho is being seen today for the evaluation of Afib with RVRat the request of Dr. Sherryll Burger.  Assessment & Plan    1. New onset Afib with RVR: -Converted to sinus rhythm on 1/29 on amiodarone infusion -Amiodarone discontinued on 1/29 -Continue Toprol XL -Continue Eliquis 5 mg bid given a CHADS2VASC of at least 5 (HTN, age x 1, DM, vascular disease, female) -Likely in the setting of her acute illness -Maintain lytes at goal  2. Acute on chronic respiratory failure with hypoxia and hypercapnia: -Improving -Felt to be multifactorial including morbid obesity with OHS/OSA and pulmonary HTN  3. Pulmonary HTN: -Echo as above -Continue gentle IV diuresis -May benefit from RHC down the road to further assess volume status  4. Acute on CKD: -Stage uncertain at this time as her renal function has been gradually getting worse over the prior 10 months -BMET pending this  AM -Per IM    For questions or updates, please contact [facility] Please consult [email or web address] for contact info under Cardiology/STEMI.    Signed, Eula Listen, PA-C [facility] Pager: [phone] 11/06/2017, 7:52 AM   Attending Note Patient seen and examined, agree with detailed note above,  Patient presentation and plan discussed on rounds.   Lethargic this morning while sitting on the bed pan, goes to sleep quickly On earlier rounds with Eula Listen, she was more alert Family at the bedside, long discussion concerning need for weight loss  Role of morbid obesity and hypoventilation  On physical exam Unable to estimate JVP ,  heart sounds regular, lungs clear to auscultation bilaterally, morbidly obese, no significant lower extremity edema  Lab work reviewed personally by myself showing hematocrit 41, potassium 4.3, creatinine 1.17, BUN 22   1. New onset Afib with RVR:  sinus rhythm on 1/29 on amiodarone infusion -Continue Toprol XL, Eliquis 5 mg bid  CHADS2VASC of at least 5 (HTN, age x 1, DM, vascular disease, female) Will follow as an outpatient, no change to medications   2. Acute on chronic respiratory failure with hypoxia and hypercapnia: multifactorial including morbid obesity /OHS/OSA  Has been evaluated by pulmonary, did not wear BiPAP last night To my exam was more lethargic this morning, concern for hypercapnia  Rounds earlier in the morning she was more alert Would continue gentle diuresis  3. Acute on CKD:  Stable on Lasix  4.  Morbid obesity Major contributor to her hypoventilation Need counseling with family to limit calorie intake as she is relatively non-mobile Started discussion today with family at the bedside  Greater than 50% was spent in counseling and coordination of care with patient Total encounter time 25 minutes or more   Signed: Dossie Arbourim Zori Benbrook  M.D., Ph.D. [facility]

## 2017-11-06 NOTE — Progress Notes (Addendum)
[employer] - [place] [facility]   PATIENT NAME: Jasmine Osborn    MR#:  [id number]  DATE OF BIRTH:  09/19/1951  SUBJECTIVE:  More lethargic  REVIEW OF SYSTEMS:  Review of Systems  Unable to perform ROS: Acuity of condition   DRUG ALLERGIES:  No Known Allergies VITALS:  Blood pressure (!) 114/43, pulse 93, temperature 98.6 F (37 C), temperature source Oral, resp. rate 18, height 5\' 4"  (1.626 m), weight 124 kg (273 lb 5.9 oz), SpO2 94 %. PHYSICAL EXAMINATION:  Physical Exam  Constitutional:  Morbidly obese  HENT:  Head: Normocephalic and atraumatic.  Eyes: Conjunctivae and EOM are normal. Pupils are equal, round, and reactive to light.  Neck: Normal range of motion. Neck supple. No tracheal deviation present. No thyromegaly present.  Cardiovascular: Normal rate, regular rhythm and normal heart sounds.  Pulmonary/Chest: Effort normal. No respiratory distress. She has no wheezes. She exhibits no tenderness.  ecreased breath sounds throughout lung fields  Abdominal: Soft. Bowel sounds are normal. She exhibits no distension. There is no tenderness.  Large abdominal hernia obese  Musculoskeletal: Normal range of motion.  Neurological: No cranial nerve deficit.  Lethargic - minimally responsive to deep painful stimuli  Skin: Skin is warm and dry. Rash noted. No erythema.  Psychiatric: Mood and affect normal.   LABORATORY PANEL:  Female CBC Recent Labs  Lab 11/06/17 1007  WBC 6.8  HGB 12.7  HCT 41.2  PLT 164   ------------------------------------------------------------------------------------------------------------------ Chemistries  Recent Labs  Lab 11/03/17 0505 11/04/17 0451  11/06/17 0720  NA 139 140   < > 141  K 4.2 4.5   < > 4.3  CL 98* 100*   < > 101  CO2 33* 32   < > 31  GLUCOSE 85 103*   < > 105*  BUN 32* 39*   < > 22*  CREATININE 1.69* 1.88*   < > 1.17*  CALCIUM 8.9 8.8*   < > 9.4  MG 2.1  --   --   --   AST  --  14*  --    --   ALT  --  9*  --   --   ALKPHOS  --  45  --   --   BILITOT  --  0.9  --   --    < > = values in this interval not displayed.   RADIOLOGY:  No results found. ASSESSMENT AND PLAN:  67 yo female w/ hx of Obesity, PVD, hypertension, history of abdominal wall hernia, diabetes, depression who presented to the hospital due to lethargy, low O2 sats and noted to be in acute on chronic respiratory failure with hypercapnia.  1. Acute on chronic respiratory failure with hypoxemia/hypercapnia-suspected to be secondary to obesity pickwickian syndrome combined with underlying sleep apnea and may be some mild CHF.  * Acute metabolic encephalopathy: likely CO2 narcosis due to underlying sleep apnea - Patient has been transitioned from BiPAP to nasal cannula but again she is more lethargic this am - may need BiPAP - i've d/w Dr Sung AmabileSimonds who will see her to decide next steps - if she needs to go back to ICU?  2. Essential hypertension-continue metoprolol  3. Diabetes type 2 without complication-continue sliding scale insulin. Hold metformin.  4. Glaucoma-continue latanoprost eyedrops.  5. Depression-continue Remeron, Risperdal.  6. WUJ:WJXBPAF:this was precipitated by current illness. Continue metoprolol - continue apixaban 5 mg twice daily as recommended by cardiology - echocardiogram shows normal LV function  7. Acute kidney injury: Creat 1.17 Hold off on nephrotoxic medications and diuresis for now  8. Hyperlipidemia: Continue fenofibrate   PT recommends SNF - Will c/s CSW    All the records are reviewed and case discussed with Care Management/Social Worker. Management plans discussed with the patient, Husband at bedside, Dr Sung Amabile, Dr Mariah Milling and they are in agreement.  CODE STATUS: DNR  TOTAL TIME TAKING CARE OF THIS PATIENT: 28 minutes.   More than 50% of the time was spent in counseling/coordination of care: YES  POSSIBLE D/C IN 1-2 DAYS, DEPENDING ON CLINICAL CONDITION. And Pulmo  eval   Delfino Lovett M.D on 11/06/2017 at 11:15 AM  Between 7am to 6pm - Pager - [phone]  After 6pm go to [email or web address] - Scientist, research (life sciences) [facility]  Office  [phone]  CC: Primary care physician; Lauro Regulus, MD  Note: This dictation was prepared with Dragon dictation along with smaller phrase technology. Any transcriptional errors that result from this process are unintentional.

## 2017-11-06 NOTE — Care Management (Signed)
CM spoke with patient's husband and he confirms that if patient is able to discharge directly home that he is available to provide caregiver assistance.  He is agreeable to home health.  Patient has overnight oximetry performed but was performed on 3 liters nasal cannula.  During the 6 hour procedure, duration of desats <89% was 4 hours.  Spoke with attending about the specific criteria that would have to be met in order for patient to  qualify for home bipap.  Process has not been started. Cardiopulmonary can perform spirometry 11/07/2017.  Obtained orders for the following from attending: Order for ABG today on patient's 3 liters nasal cannula and ABG performed while asleep or upon awakening and breathing the prescribed oxygen (not bipap), spironmetry.  Discussed that it is reported that patient seems more lethargic today. Last ABG results seen is 11/03/2017 on bipap.  Spoke with Fritz Pickerel with cardiopulmonary about blood gas specifics. UPdated primary nurse

## 2017-11-07 LAB — GLUCOSE, CAPILLARY
Glucose-Capillary: 97 mg/dL (ref 65–99)
Glucose-Capillary: 162 mg/dL — ABNORMAL HIGH (ref 65–99)
Glucose-Capillary: 99 mg/dL (ref 65–99)
Glucose-Capillary: 106 mg/dL — ABNORMAL HIGH (ref 65–99)

## 2017-11-07 LAB — CULTURE, BLOOD (ROUTINE X 2)
Culture: NO GROWTH
Culture: NO GROWTH
Specimen Description: ADEQUATE

## 2017-11-07 LAB — BLOOD GAS, ARTERIAL
Acid-Base Excess: 12.9 mmol/L — ABNORMAL HIGH (ref 0.0–2.0)
Bicarbonate: 41.8 mmol/L — ABNORMAL HIGH (ref 20.0–28.0)
FIO2: 0.28
O2 SAT: 68 %
PCO2 ART: 74 mmHg — AB (ref 32.0–48.0)
PH ART: 7.36 (ref 7.350–7.450)
PO2 ART: 37 mmHg — AB (ref 83.0–108.0)
Patient temperature: 37

## 2017-11-07 LAB — CBC
HEMATOCRIT: 42.1 % (ref 35.0–47.0)
HEMOGLOBIN: 13.1 g/dL (ref 12.0–16.0)
MCH: 27 pg (ref 26.0–34.0)
MCHC: 31.2 g/dL — ABNORMAL LOW (ref 32.0–36.0)
MCV: 86.6 fL (ref 80.0–100.0)
PLATELETS: 154 10*3/uL (ref 150–440)
RBC: 4.86 MIL/uL (ref 3.80–5.20)
RDW: 17.1 % — ABNORMAL HIGH (ref 11.5–14.5)
WBC: 6.3 10*3/uL (ref 3.6–11.0)

## 2017-11-07 LAB — BASIC METABOLIC PANEL
ANION GAP: 11 (ref 5–15)
BUN: 20 mg/dL (ref 6–20)
CHLORIDE: 100 mmol/L — AB (ref 101–111)
CO2: 31 mmol/L (ref 22–32)
Calcium: 9.8 mg/dL (ref 8.9–10.3)
Creatinine, Ser: 1.04 mg/dL — ABNORMAL HIGH (ref 0.44–1.00)
GFR calc Af Amer: >60 mL/min (ref >60)
GFR calc non Af Amer: 55 mL/min — ABNORMAL LOW (ref >60)
GLUCOSE: 108 mg/dL — AB (ref 65–99)
POTASSIUM: 4.2 mmol/L (ref 3.5–5.1)
Sodium: 142 mmol/L (ref 135–145)

## 2017-11-07 NOTE — Care Management Important Message (Signed)
Important Message  Patient Details  Name: Jasmine Osborn MRN: [id number] Date of Birth: 03/24/1951   Medicare Important Message Given:  Yes Signed IM notice given    Eber HongGreene, Yamna Mackel R, RN 11/07/2017, 5:51 PM

## 2017-11-07 NOTE — Care Management (Signed)
[facility] Necessity    Patient is a good candidate for AVAPs. The patient has OHS(Obesity Hypoventilation Syndrome) and Acute on Chronic respiratory failure; a severe and life- threatening disease state. NIPPV is essential therapy for patients chronic disease condition since modes on the machine can accommodate a dual prescription and it is able to measure the volume and pressures accurately. Close monitoring with the NIV will be most efficient with the goals of maintaining adequate respiratory status and avoiding future hospital stays. BiPAP and CPAP do not have these capabilities, so these modes of therapy have been ruled out. NIPPV will provide mouthpiece ventilation that BIPAP ST cannot provide. Furthermore a BiPAP or CPAP is insufficient due to the severity of patients condition ; absence of respiratory support via NIPPV for more than four hours can increase PC02 and as a result can lead to death. The patient can wear NIPPV greater than four hours during the day and nocturnal use. The features the the NIPPV exhibits is the best modality for the patient. Patient was tried and failed on hospital grade BIPAP and ABG was obtained showing a PC02 of 65. We recommend NIPPV to reduce the risk of aspiration, tracheostomy and or long term ventilation. NIPPV needs to be expedited for authorization to safely discharge patient from hospital to home.

## 2017-11-07 NOTE — Progress Notes (Signed)
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 Multiple hospital admissions due to respiratory failure, pt remains at high risk of death and readmission due to recurrent respiratory failure and would benefit from bipap therapy at home.  Pulmonary hypertension by echocardiogram 03/18 Biventricular failure Paroxysmal atrial fibrillation with RVR -now NSR after amiodarone load AKI/CKD -baseline creatinine 1.4  PLAN/REC: --D/w social work, husband, will try to obtain home bipap to reduce risk of rehospitalization and death.  --Continue Bipap qhs.  --If does not qualify for home bipap per insurance, then will need outpatient sleep study.     Date: 11/07/2017  MRN# [id number] Jasmine Osborn 06/03/1951   Jasmine Osborn is a 67 y.o. old female seen in follow up for chief complaint of  Chief Complaint  Patient presents with  . Shortness of Breath     HPI:  Pt sleepy but awake. No new complaints today, feels that her breathing is better. She was able to wear bipap overnight and well tolerated.    Medication:    Current Facility-Administered Medications:  .  acetaminophen (TYLENOL) tablet 650 mg, 650 mg, Oral, Q6H PRN **OR** [DISCONTINUED] acetaminophen (TYLENOL) suppository 650 mg, 650 mg, Rectal, Q6H PRN, Cherlynn Kaiser, Rolly Pancake, MD .  apixaban (ELIQUIS) tablet 5 mg, 5 mg, Oral, BID, Dunn, Ryan M, PA-C, 5 mg at 11/07/17 0907 .  chlorhexidine (PERIDEX) 0.12 % solution 15 mL, 15 mL, Mouth Rinse, BID, Tukov, Magadalene S, NP, 15 mL at 11/07/17 0907 .  fenofibrate tablet 160 mg, 160 mg, Oral, Daily, Houston Siren, MD, 160 mg at 11/07/17 0908 .  furosemide (LASIX) injection 20 mg, 20 mg, Intravenous, Daily, Eula Listen M, PA-C, 20 mg at 11/07/17 0907 .  insulin aspart (novoLOG) injection 0-5 Units, 0-5  Units, Subcutaneous, QHS, Houston Siren, MD, Stopped at 11/02/17 2146 .  insulin aspart (novoLOG) injection 0-9 Units, 0-9 Units, Subcutaneous, TID WC, Houston Siren, MD, 1 Units at 11/05/17 1641 .  latanoprost (XALATAN) 0.005 % ophthalmic solution 1 drop, 1 drop, Both Eyes, QHS, Sainani, Rolly Pancake, MD, 1 drop at 11/04/17 2125 .  losartan (COZAAR) tablet 25 mg, 25 mg, Oral, Daily, Merwyn Katos, MD, 25 mg at 11/07/17 0908 .  MEDLINE mouth rinse, 15 mL, Mouth Rinse, q12n4p, Tukov, Magadalene S, NP, 15 mL at 11/05/17 1642 .  metoprolol succinate (TOPROL-XL) 24 hr tablet 50 mg, 50 mg, Oral, Daily, Houston Siren, MD, 50 mg at 11/07/17 0907 .  mirtazapine (REMERON) tablet 30 mg, 30 mg, Oral, QHS, Sainani, Rolly Pancake, MD, 30 mg at 11/06/17 2141 .  multivitamin-lutein (OCUVITE-LUTEIN) capsule 1 capsule, 1 capsule, Oral, Daily, Delfino Lovett, MD, 1 capsule at 11/07/17 0907 .  mupirocin ointment (BACTROBAN) 2 %, , Topical, Daily, Delfino Lovett, MD .  nutrition supplement (JUVEN) (JUVEN) powder packet 2 packet, 2 packet, Oral, BID BM, Delfino Lovett, MD, 2 packet at 11/06/17 1448 .  [DISCONTINUED] ondansetron (ZOFRAN) tablet 4 mg, 4 mg, Oral, Q6H PRN **OR** ondansetron (ZOFRAN) injection 4 mg, 4 mg, Intravenous, Q6H PRN, Sainani, Vivek J, MD .  protein supplement (PREMIER PROTEIN) liquid, 11 oz, Oral, BID BM, Delfino Lovett, MD, 11 oz at 11/06/17 1640   Allergies:  Patient has no known allergies.  Review of Systems: Gen:  Denies  fever, sweats. HEENT: Denies blurred vision. Cvc:  No dizziness, chest pain or heaviness Resp:  Denies cough or sputum porduction. Gi: Denies swallowing difficulty, stomach pain. constipation, bowel incontinence Gu:  Denies bladder incontinence, burning urine Ext:   No Joint pain, stiffness. Skin: No skin rash, easy bruising. Endoc:  No polyuria, polydipsia. Psych: No depression, insomnia. Other:  All other systems were reviewed and found to be negative other than what is  mentioned in the HPI.   Physical Examination:   VS: BP (!) 144/70 (BP Location: Right Arm)   Pulse 95   Temp 98.1 F (36.7 C) (Oral)   Resp 20   Ht 5\' 4"  (1.626 m)   Wt (!) 304 lb (137.9 kg)   SpO2 94%   BMI 52.18 kg/m    General Appearance: No distress, obese.  Neuro:without focal findings,  speech normal,  HEENT: PERRLA, EOM intact. Pulmonary: normal breath sounds, No wheezing.   CardiovascularNormal S1,S2.  No m/r/g.   Abdomen: Benign, Soft, non-tender. Renal:  No costovertebral tenderness  GU:  Not performed at this time. Endoc: No evident thyromegaly, no signs of acromegaly. Skin:   warm, no rash. Extremities: normal, no cyanosis, clubbing.   LABORATORY PANEL:   CBC Recent Labs  Lab 11/07/17 0529  WBC 6.3  HGB 13.1  HCT 42.1  PLT 154   ------------------------------------------------------------------------------------------------------------------  Chemistries  Recent Labs  Lab 11/03/17 0505 11/04/17 0451  11/07/17 0529  NA 139 140   < > 142  K 4.2 4.5   < > 4.2  CL 98* 100*   < > 100*  CO2 33* 32   < > 31  GLUCOSE 85 103*   < > 108*  BUN 32* 39*   < > 20  CREATININE 1.69* 1.88*   < > 1.04*  CALCIUM 8.9 8.8*   < > 9.8  MG 2.1  --   --   --   AST  --  14*  --   --   ALT  --  9*  --   --   ALKPHOS  --  45  --   --   BILITOT  --  0.9  --   --    < > = values in this interval not displayed.   ------------------------------------------------------------------------------------------------------------------  Cardiac Enzymes Recent Labs  Lab 11/02/17 1204  TROPONINI <0.03   ------------------------------------------------------------  RADIOLOGY:   No results found for this or any previous visit. No results found for this or any previous visit. ------------------------------------------------------------------------------------------------------------------  Thank  you for allowing [facility] to assist in the care  of your patient. Our recommendations are noted above.  Please contact us if we can be of further service.   Wells Guileseep Genifer Lazenby, MD.  [facility] Office Number: [phone]  Santiago Gladavid Kasa, M.D.  Billy Fischeravid Simonds, M.D  11/07/2017

## 2017-11-07 NOTE — Progress Notes (Signed)
Progress Note  Patient Name: Jasmine Osborn Date of Encounter: 11/07/2017  Primary Cardiologist: new to [facility] - consult by End  Subjective   No complaints this morning Seen by pulmonary, BiPAP recommended for hypercarbia Waxing waning mentation Patient examined as she was completing spirometry procedure  Inpatient Medications    Scheduled Meds: . apixaban  5 mg Oral BID  . chlorhexidine  15 mL Mouth Rinse BID  . fenofibrate  160 mg Oral Daily  . furosemide  20 mg Intravenous Daily  . insulin aspart  0-5 Units Subcutaneous QHS  . insulin aspart  0-9 Units Subcutaneous TID WC  . latanoprost  1 drop Both Eyes QHS  . losartan  25 mg Oral Daily  . mouth rinse  15 mL Mouth Rinse q12n4p  . metoprolol succinate  50 mg Oral Daily  . mirtazapine  30 mg Oral QHS  . multivitamin-lutein  1 capsule Oral Daily  . mupirocin ointment   Topical Daily  . nutrition supplement (JUVEN)  2 packet Oral BID BM  . protein supplement shake  11 oz Oral BID BM   Continuous Infusions:  PRN Meds: acetaminophen **OR** [DISCONTINUED] acetaminophen, [DISCONTINUED] ondansetron **OR** ondansetron (ZOFRAN) IV   Vital Signs    Vitals:   11/06/17 1956 11/07/17 0500 11/07/17 0603 11/07/17 0809  BP: (!) 145/79  (!) 163/86 (!) 144/70  Pulse: 91  93 95  Resp: 18  20   Temp: 98.6 F (37 C)  98.2 F (36.8 C) 98.1 F (36.7 C)  TempSrc: Oral   Oral  SpO2: 97%  95% 94%  Weight:  (!) 304 lb (137.9 kg)    Height:        Intake/Output Summary (Last 24 hours) at 11/07/2017 1148 Last data filed at 11/07/2017 0300 Gross per 24 hour  Intake 720 ml  Output 650 ml  Net 70 ml   Filed Weights   11/02/17 1728 11/07/17 0500  Weight: 273 lb 5.9 oz (124 kg) (!) 304 lb (137.9 kg)    Telemetry    NSR- Personally Reviewed  ECG    n/a - Personally Reviewed  Physical Exam   GEN: No acute distress.  Alert, minimally communicative Neck: JVD difficult to assess 2/2 body habitus. Cardiac: RRR, no murmurs,  rubs, or gallops.  Respiratory: Diminished bilaterally.  GI: Soft, nontender, non-distended.   MS: Trace bilateral pre-tibial edema; No deformity. Neuro:  Alert and oriented x 3; Nonfocal.  Psych: Alert but not oriented,  Labs    Chemistry Recent Labs  Lab 11/02/17 1204  11/04/17 0451 11/05/17 0457 11/06/17 0720 11/07/17 0529  NA 137   < > 140 141 141 142  K 4.3   < > 4.5 4.1 4.3 4.2  CL 97*   < > 100* 103 101 100*  CO2 33*   < > 32 32 31 31  GLUCOSE 162*   < > 103* 91 105* 108*  BUN 32*   < > 39* 27* 22* 20  CREATININE 1.63*   < > 1.88* 1.39* 1.17* 1.04*  CALCIUM 8.9   < > 8.8* 9.1 9.4 9.8  PROT 7.1  --  6.7  --   --   --   ALBUMIN 3.8  --  3.4*  --   --   --   AST 17  --  14*  --   --   --   ALT 11*  --  9*  --   --   --  ALKPHOS 52  --  45  --   --   --   BILITOT 0.7  --  0.9  --   --   --   GFRNONAA 32*   < > 27* 39* 47* 55*  GFRAA 37*   < > 31* 45* 55* >60  ANIONGAP 7   < > 8 6 9 11    < > = values in this interval not displayed.     Hematology Recent Labs  Lab 11/05/17 0457 11/06/17 1007 11/07/17 0529  WBC 5.6 6.8 6.3  RBC 4.66 4.70 4.86  HGB 12.7 12.7 13.1  HCT 41.1 41.2 42.1  MCV 88.3 87.7 86.6  MCH 27.3 27.1 27.0  MCHC 30.9* 30.9* 31.2*  RDW 17.6* 17.8* 17.1*  PLT 135* 164 154    Cardiac Enzymes Recent Labs  Lab 11/02/17 1204  TROPONINI <0.03   No results for input(s): TROPIPOC in the last 168 hours.   BNP Recent Labs  Lab 11/02/17 1204  BNP 1,005.0*     DDimer No results for input(s): DDIMER in the last 168 hours.   Radiology    No results found.  Cardiac Studies   TTE 12/2016: Study Conclusions  - Left ventricle: The cavity size was normal. There was mild concentric hypertrophy. Systolic function was normal. The estimated ejection fraction was in the range of 60% to 65%. The study is not technically sufficient to allow evaluation of LV diastolic function. - Left atrium: The atrium was mildly dilated. - Pulmonary  arteries: Systolic pressure was moderately increased. PA peak pressure: 50 mm Hg (S).   TTE 11/04/17: Study Conclusions  - Procedure narrative: Transthoracic echocardiography. Image quality was suboptimal. The study was technically difficult, as a result of poor acoustic windows. Intravenous contrast (Definity) was administered. - Left ventricle: The cavity size was mildly dilated. Wall thickness was at the upper limits of normal. Systolic function was vigorous. The estimated ejection fraction was in the range of 65% to 70%. Doppler parameters are consistent with abnormal left ventricular relaxation (grade 1 diastolic dysfunction). Doppler parameters are consistent with high ventricular filling pressure. - Mitral valve: Mildly calcified annulus. - Right ventricle: The cavity size was mildly dilated. Systolic function was normal. - Inferior vena cava: The vessel was dilated. The respirophasic diameter changes were blunted (<50%), consistent with elevated central venous pressure. - Pericardium, extracardiac: A trivial pericardial effusion was identified.   Patient Profile     67 y.o. female with history of chronic respiratory failure felt to be 2/2 OHS/OSA, pulmonary hypertension, HTN, HLD, DM, morbid obesity, and depressionwho is being seen today for the evaluation of Afib with RVRat the request of Dr. Sherryll Burger.  Assessment & Plan    1. New onset Afib with RVR: -Converted to sinus rhythm on 1/29 on amiodarone infusion -Amiodarone discontinued on 1/29 -Continue Toprol XL -Continue Eliquis 5 mg bid given a CHADS2VASC of at least 5 (HTN, age x 1, DM, vascular disease, female) Maintaining normal sinus rhythm, stable  2. Acute on chronic respiratory failure with hypoxia and hypercapnia: Concern for central neurologic issue as well  Seen by pulmonary, morbid obesity with OHS/OSA and pulmonary HTN Candidate for BiPAP having incentive spirometry  measurements done this morning  3. Pulmonary HTN: -Echo as above  transition to Lasix 40 p.o. at discharge  4. Acute on CKD: Improved with diuresis   Total encounter time more than 25 minutes  Greater than 50% was spent in counseling and coordination of care with the patient  For questions or updates, please contact [facility] Please consult [email or web address] for contact info under Cardiology/STEMI.    Signed, Eula Listenyan Dunn, PA-C [facility] Pager: [phone] 11/07/2017, 11:48 AM   Attending Note Patient seen and examined, agree with detailed note above,  Patient presentation and plan discussed on rounds.   Lethargic this morning while sitting on the bed pan, goes to sleep quickly On earlier rounds with Eula Listenyan Dunn, she was more alert Family at the bedside, long discussion concerning need for weight loss  Role of morbid obesity and hypoventilation  On physical exam Unable to estimate JVP , heart sounds regular, lungs clear to auscultation bilaterally, morbidly obese, no significant lower extremity edema  Lab work reviewed personally by myself showing hematocrit 41, potassium 4.3, creatinine 1.17, BUN 22   1. New onset Afib with RVR:  sinus rhythm on 1/29 on amiodarone infusion -Continue Toprol XL, Eliquis 5 mg bid  CHADS2VASC of at least 5 (HTN, age x 1, DM, vascular disease, female) Will follow as an outpatient, no change to medications   2. Acute on chronic respiratory failure with hypoxia and hypercapnia: multifactorial including morbid obesity /OHS/OSA  Has been evaluated by pulmonary, did not wear BiPAP last night To my exam was more lethargic this morning, concern for hypercapnia  Rounds earlier in the morning she was more alert Would continue gentle diuresis  3. Acute on CKD:  Stable on Lasix  4.  Morbid obesity Major contributor to her hypoventilation Need counseling with family to limit calorie intake as she is relatively non-mobile Started  discussion today with family at the bedside  Greater than 50% was spent in counseling and coordination of care with patient Total encounter time 25 minutes or more   Signed: Dossie Arbourim Gollan  M.D., Ph.D. [facility]

## 2017-11-07 NOTE — Progress Notes (Signed)
Pt. Taken off BIPAP for ABG in the morning.

## 2017-11-07 NOTE — Care Management (Signed)
Have provided [facility] with ABG and spirometry results for home bipap.  Have updated husband. It had been reported to CM that patient has a sleep study scheduled within the next 2 weeks. CM spoke with husband and informed this is not the case.  If patient is found not to meet to criteria for the home bipap, a sleep study will have to be scheduled.

## 2017-11-07 NOTE — Progress Notes (Signed)
Patient ID: Jasmine Osborn, female   DOB: 07-Mar-1951, 67 y.o.   MRN: [id number]  [facility] PROGRESS NOTE  Sherran Margolis Lok WUJ:[id number] DOB: 01-06-1951 DOA: 11/02/2017 PCP: Lauro Regulus, MD  HPI/Subjective: Patient feels okay.  States is hard to take a deep breath.  Feels a little weak.  Plan is to go home with home health.  Objective: Vitals:   11/07/17 0603 11/07/17 0809  BP: (!) 163/86 (!) 144/70  Pulse: 93 95  Resp: 20   Temp: 98.2 F (36.8 C) 98.1 F (36.7 C)  SpO2: 95% 94%    Filed Weights   11/02/17 1728 11/07/17 0500  Weight: 124 kg (273 lb 5.9 oz) (!) 137.9 kg (304 lb)    ROS: Review of Systems  Constitutional: Negative for chills and fever.  Eyes: Negative for blurred vision.  Respiratory: Positive for shortness of breath. Negative for cough.   Cardiovascular: Negative for chest pain.  Gastrointestinal: Negative for abdominal pain, constipation, diarrhea, nausea and vomiting.  Genitourinary: Negative for dysuria.  Musculoskeletal: Negative for joint pain.  Neurological: Positive for weakness. Negative for dizziness and headaches.   Exam: Physical Exam  Constitutional: She is oriented to person, place, and time.  HENT:  Nose: No mucosal edema.  Mouth/Throat: No oropharyngeal exudate or posterior oropharyngeal edema.  Eyes: Conjunctivae, EOM and lids are normal. Pupils are equal, round, and reactive to light.  Neck: No JVD present. Carotid bruit is not present. No edema present. No thyroid mass and no thyromegaly present.  Cardiovascular: S1 normal and S2 normal. Exam reveals no gallop.  No murmur heard. Pulses:      Dorsalis pedis pulses are 2+ on the right side, and 2+ on the left side.  Respiratory: No respiratory distress. She has decreased breath sounds in the right middle field, the right lower field, the left middle field and the left lower field. She has no wheezes. She has rhonchi in the right lower field and the left lower field.  She has no rales.  GI: Soft. Bowel sounds are normal. There is no tenderness.  Musculoskeletal:       Right ankle: She exhibits swelling.       Left ankle: She exhibits swelling.  Lymphadenopathy:    She has no cervical adenopathy.  Neurological: She is alert and oriented to person, place, and time. No cranial nerve deficit.  Skin: Skin is warm. Nails show no clubbing.  Two left heel ulcerations.  Psychiatric: She has a normal mood and affect.      Data Reviewed: Basic Metabolic Panel: Recent Labs  Lab 11/03/17 0505 11/04/17 0451 11/05/17 0457 11/06/17 0720 11/07/17 0529  NA 139 140 141 141 142  K 4.2 4.5 4.1 4.3 4.2  CL 98* 100* 103 101 100*  CO2 33* 32 32 31 31  GLUCOSE 85 103* 91 105* 108*  BUN 32* 39* 27* 22* 20  CREATININE 1.69* 1.88* 1.39* 1.17* 1.04*  CALCIUM 8.9 8.8* 9.1 9.4 9.8  MG 2.1  --   --   --   --   PHOS 4.2  --   --   --   --    Liver Function Tests: Recent Labs  Lab 11/02/17 1204 11/04/17 0451  AST 17 14*  ALT 11* 9*  ALKPHOS 52 45  BILITOT 0.7 0.9  PROT 7.1 6.7  ALBUMIN 3.8 3.4*   CBC: Recent Labs  Lab 11/02/17 1204 11/03/17 0505 11/04/17 0451 11/05/17 0457 11/06/17 1007 11/07/17 0529  WBC 9.0 8.0  8.1 5.6 6.8 6.3  NEUTROABS 7.3*  --   --   --   --   --   HGB 14.1 12.9 13.1 12.7 12.7 13.1  HCT 44.4 41.9 42.4 41.1 41.2 42.1  MCV 87.8 88.0 86.6 88.3 87.7 86.6  PLT 175 171 167 135* 164 154   Cardiac Enzymes: Recent Labs  Lab 11/02/17 1204  TROPONINI <0.03   BNP (last 3 results) Recent Labs    12/22/16 1510 01/21/17 0702 11/02/17 1204  BNP 569.0* 184.0* 1,005.0*    CBG: Recent Labs  Lab 11/06/17 0747 11/06/17 1158 11/06/17 1713 11/07/17 0746 11/07/17 1205  GLUCAP 108* 155* 125* 97 162*    Recent Results (from the past 240 hour(s))  Culture, blood (routine x 2)     Status: None   Collection Time: 11/02/17 12:04 PM  Result Value Ref Range Status   Specimen Description   Final    BLOOD Blood Culture results may  not be optimal due to an excessive volume of blood received in culture bottles   Special Requests   Final    BOTTLES DRAWN AEROBIC AND ANAEROBIC LEFT ANTECUBITAL   Culture   Final    NO GROWTH 5 DAYS Performed at [facility], [address] [place], [place] [address]    Report Status 11/07/2017 FINAL  Final  Culture, blood (routine x 2)     Status: None   Collection Time: 11/02/17 12:29 PM  Result Value Ref Range Status   Specimen Description BLOOD Blood Culture adequate volume  Final   Special Requests   Final    BOTTLES DRAWN AEROBIC AND ANAEROBIC BLOOD RIGHT HAND   Culture   Final    NO GROWTH 5 DAYS Performed at [facility], [address] [place], [place] [address]    Report Status 11/07/2017 FINAL  Final  MRSA PCR Screening     Status: None   Collection Time: 11/02/17  5:32 PM  Result Value Ref Range Status   MRSA by PCR NEGATIVE NEGATIVE Final    Comment:        The GeneXpert MRSA Assay (FDA approved for NASAL specimens only), is one component of a comprehensive MRSA colonization surveillance program. It is not intended to diagnose MRSA infection nor to guide or monitor treatment for MRSA infections. Performed at [facility], [address] [place], [place] [address]   Urine Culture     Status: None   Collection Time: 11/03/17 12:23 AM  Result Value Ref Range Status   Specimen Description   Final    URINE, RANDOM Performed at [facility], [address] [place], [place] [address]    Special Requests   Final    NONE Performed at [facility], [address] [place], [place] [address]    Culture   Final    NO GROWTH Performed at [facility], [address] [place], [place] [address]    Report Status 11/04/2017 FINAL  Final      Scheduled Meds: . apixaban  5 mg Oral BID  . chlorhexidine  15 mL Mouth Rinse BID  . fenofibrate  160 mg Oral Daily  . furosemide  20 mg Intravenous Daily  .  insulin aspart  0-5 Units Subcutaneous QHS  . insulin aspart  0-9 Units Subcutaneous TID WC  . latanoprost  1 drop Both Eyes QHS  . losartan  25 mg Oral Daily  . mouth rinse  15 mL Mouth Rinse  [id number]  . metoprolol succinate  50 mg Oral Daily  . mirtazapine  30 mg Oral QHS  . multivitamin-lutein  1 capsule Oral Daily  . mupirocin ointment   Topical Daily  . nutrition supplement (JUVEN)  2 packet Oral BID BM  . protein supplement shake  11 oz Oral BID BM   Continuous Infusions:  Assessment/Plan:  1. Acute on chronic respiratory failure with hypoxemia and hypercapnia.  Care manager to check with home health company about getting BiPAP at night and setting up oxygen at home. 2. Obesity hypoventilation syndrome.  I advised that she needs to get up and move more and move better air in her lungs. 3. Acute encephalopathy seems better today. 4. Transient atrial fibrillation.  Converted to normal sinus rhythm with amiodarone infusion.  Now currently on Eliquis and metoprolol. 5. Essential hypertension on metoprolol and losartan 6. Type 2 diabetes mellitus without complication on sliding scale insulin 7. Glaucoma on latanoprost eyedrops 8. Depression on Remeron and Risperdal 9. Acute kidney injury on chronic kidney disease stage III.  Improved with diuresis.  Code Status:     Code Status Orders  (From admission, onward)        Start     Ordered   11/04/17 1221  Do not attempt resuscitation (DNR)  Continuous    Question Answer Comment  In the event of cardiac or respiratory ARREST Do not call a "code blue"   In the event of cardiac or respiratory ARREST Do not perform Intubation, CPR, defibrillation or ACLS   In the event of cardiac or respiratory ARREST Use medication by any route, position, wound care, and other measures to relive pain and suffering. May use oxygen, suction and manual treatment of airway obstruction as needed for comfort.   Comments MOST form in chart      11/04/17  1221    Code Status History    Date Active Date Inactive Code Status Order ID Comments User Context   11/02/2017 17:31 11/04/2017 12:21 Full Code [id number]  Houston Siren, MD Inpatient   12/22/2016 18:25 01/03/2017 20:11 Full Code [id number]  Tora Kindred, DO ED     Family Communication: Husband at the bedside Disposition Plan: Husband and patient wants to go home with home health.  Spoke with care manager to try to get BiPAP and oxygen approved  Consultants:  Cardiology  Pulmonology  Time spent: 28 minutes with coordination of care speaking with care manager and cardiology team  [employer]

## 2017-11-07 NOTE — Clinical Social Work Note (Signed)
CSW received referral for SNF.  Case discussed with case manager and plan is to discharge home with home health.  CSW to sign off please re-consult if social work needs arise.  Beila Purdie R. Fount Bahe, MSW, LCSWA [phone]  

## 2017-11-07 NOTE — Care Management (Addendum)
Patient did not meet criteria for [facility] to provide home bipap. Contacted [facility] to determine if agency could provide assist with current data and informed could provide Trilogy with current findings.  Notified attending.  Informed primary nurse of need to have a home 02 assessment performed in the morning.  If requires continuous 02, order and sats will have to be faxed to [facility].  Fayrene FearingJames is going to try and have to trilogy delivered 2/2, but this is depending on insurance approval.  Updated [facility] that patient may discharge home tomorrow and agency will be able to see patient within 24 hours of discharge . There will be no need for outpatient sleep study

## 2017-11-08 LAB — BLOOD GAS, ARTERIAL
Acid-Base Excess: 15.4 mmol/L — ABNORMAL HIGH (ref 0.0–2.0)
Bicarbonate: 43.5 mmol/L — ABNORMAL HIGH (ref 20.0–28.0)
FIO2: 36
O2 Saturation: 88.8 %
Patient temperature: 37
pCO2 arterial: 67 mmHg (ref 32.0–48.0)
pH, Arterial: 7.42 (ref 7.350–7.450)
pO2, Arterial: 55 mmHg — ABNORMAL LOW (ref 83.0–108.0)

## 2017-11-08 LAB — GLUCOSE, CAPILLARY
GLUCOSE-CAPILLARY: 130 mg/dL — AB (ref 65–99)
Glucose-Capillary: 94 mg/dL (ref 65–99)

## 2017-11-08 MED ORDER — JUVEN PO PACK: 2.0000 | PACK | Freq: Two times a day (BID) | ORAL | 0 refills | Status: DC

## 2017-11-08 MED ORDER — APIXABAN 5 MG PO TABS: 5.0000 mg | ORAL_TABLET | Freq: Two times a day (BID) | ORAL | 0 refills | Status: DC

## 2017-11-08 MED ORDER — LOSARTAN POTASSIUM 25 MG PO TABS: 50.0000 mg | ORAL_TABLET | Freq: Every day | ORAL | 0 refills | Status: DC

## 2017-11-08 MED ORDER — OCUVITE-LUTEIN PO CAPS: 1.0000 | ORAL_CAPSULE | Freq: Every day | ORAL | 0 refills | Status: AC

## 2017-11-08 NOTE — Progress Notes (Signed)
SATURATION QUALIFICATIONS: (This note is used to comply with regulatory documentation for home oxygen)  Patient Saturations on Room Air at Rest = 83%  Patient Saturations on Room Air while Ambulating = n/a%  Patient Saturations on 4 Liters of oxygen while Ambulating = 92%  Please briefly explain why patient needs home oxygen: n/a

## 2017-11-08 NOTE — Progress Notes (Signed)
Placed on Trilogy by [facility].Seems to be tolerating well.In avaps-ae mode with 4l o2 bleed in.ABG acceptable on these settings.Very drowsy but will respond to questions

## 2017-11-08 NOTE — Progress Notes (Signed)
Jasmine Bottomsarlene O Smail to be D/C'd Home with home health per MD order.  Discussed prescriptions and follow up appointments with the patient and husband. Prescriptions were e-prescribed, medication list explained in detail. Pt verbalized understanding.  Allergies as of 11/08/2017   No Known Allergies     Medication List    STOP taking these medications   amLODipine 5 MG tablet Commonly known as:  NORVASC   aspirin 81 MG tablet   cefdinir 300 MG capsule Commonly known as:  OMNICEF   megestrol 40 MG tablet Commonly known as:  MEGACE   metoprolol tartrate 50 MG tablet Commonly known as:  LOPRESSOR     TAKE these medications   apixaban 5 MG Tabs tablet Commonly known as:  ELIQUIS Take 1 tablet (5 mg total) by mouth 2 (two) times daily.   fenofibrate 160 MG tablet Take 160 mg by mouth daily.   KLOR-CON 10 10 MEQ tablet Generic drug:  potassium chloride Take 20 mEq by mouth daily.   latanoprost 0.005 % ophthalmic solution Commonly known as:  XALATAN USE 1 DROP IN EACH EYE AT BEDTIME   losartan 25 MG tablet Commonly known as:  COZAAR Take 2 tablets (50 mg total) by mouth daily. What changed:  medication strength   metFORMIN 500 MG tablet Commonly known as:  GLUCOPHAGE Take 500 mg by mouth 2 (two) times daily with a meal.   metoprolol succinate 50 MG 24 hr tablet Commonly known as:  TOPROL-XL Take 50 mg by mouth daily.   mirtazapine 30 MG tablet Commonly known as:  REMERON Take 30 mg by mouth at bedtime.   multivitamin-lutein Caps capsule Take 1 capsule by mouth daily. Start taking on:  11/09/2017   nutrition supplement (JUVEN) Pack Take 2 packets by mouth 2 (two) times daily between meals.   risperiDONE 1 MG tablet Commonly known as:  RISPERDAL Take 1 tablet (1 mg total) by mouth at bedtime. What changed:  how much to take   torsemide 20 MG tablet Commonly known as:  DEMADEX Take 20 mg by mouth daily.            Durable Medical Equipment  (From  admission, onward)        Start     Ordered   11/08/17 1005  For home use only DME oxygen  Once    Comments:  bleed oxygen 3 liters in with NIV Trilogy machine auto  Question Answer Comment  Mode or (Route) Nasal cannula   Liters per Minute 3   Frequency Continuous (stationary and portable oxygen unit needed)   Oxygen conserving device Yes   Oxygen delivery system Gas      11/08/17 1005      Vitals:   11/08/17 0806 11/08/17 1532  BP: (!) 154/76 (!) 145/75  Pulse: 95 92  Resp: 18 18  Temp: 97.9 F (36.6 C) 98.3 F (36.8 C)  SpO2: 93% 95%    Tele box removed and returned.Skin clean, dry and intact without evidence of skin break down, no evidence of skin tears noted. IV catheter discontinued intact. Site without signs and symptoms of complications. Dressing and pressure applied. Pt denies pain at this time. No complaints noted.  An After Visit Summary was printed and given to the patient. Patient escorted via WC, and D/C home via private auto.  Rigoberto NoelErica Y Thierry Dobosz

## 2017-11-08 NOTE — Discharge Summary (Signed)
[facility] - [place] [facility]   PATIENT NAME: Jasmine Osborn    MR#:  [id number]  DATE OF BIRTH:  November 12, 1950  DATE OF ADMISSION:  11/02/2017 ADMITTING PHYSICIAN: Houston Siren, MD  DATE OF DISCHARGE: 11/08/2017  PRIMARY CARE PHYSICIAN: Lauro Regulus, MD    ADMISSION DIAGNOSIS:  SOB (shortness of breath) [R06.02] Hypoxia [R09.02] CO2 retention [E87.2]  DISCHARGE DIAGNOSIS:  Principal Problem:   Acute on chronic respiratory failure with hypoxia and hypercapnia (HCC) Active Problems:   Persistent atrial fibrillation (HCC)   Morbid obesity (HCC)   SECONDARY DIAGNOSIS:   Past Medical History:  Diagnosis Date  . Depression   . Diabetes mellitus without complication (HCC)   . Elevated lipids   . Hernia of abdominal wall   . Hypertension   . Increased endometrial stripe thickness 06/04/2017  . Increased endometrial stripe thickness 06/04/2017  . Psychosis (HCC)   . PVD (peripheral vascular disease) (HCC)    heel ulcer Lt     HOSPITAL COURSE:   1.  Acute on chronic respiratory failure with hypoxemia and hypercapnia.  Patient was set up for trilogy machine at home at night and while sleeping and taking naps.  Patient also is a candidate for oxygen 3 L at rest and 4 L with ambulation.  CT scan was negative for pulmonary embolism. 2.  Obesity hypoventilation syndrome.  I advised that she needs to get up and move more.  Incentive spirometry was ordered.  Patient was seen in consultation by pulmonary. 3.  Acute encephalopathy likely secondary to hypercapnia.  Patient did not do well with the BiPAP last night but did well with the trilogy today and is more alert. 4.  Transient atrial fibrillation.  Converted to normal sinus rhythm with amiodarone infusion.  Currently on Eliquis for anticoagulation and metoprolol. 5.  Essential hypertension on metoprolol and losartan 6.  Type 2 diabetes mellitus without complication on metformin 7.  Glaucoma on  latanoprost eyedrops 8.  Depression on Remeron and Risperdal 9.  Acute kidney injury on chronic kidney disease stage III.  Improved with diuresis.  Creatinine was as high as 1.88 and on discharge 1.04. 10.  Weakness.  Physical therapy recommended rehab but patient and husband would like her to go home with home health.  DISCHARGE CONDITIONS:   Fair  CONSULTS OBTAINED:  Treatment Team:  Shane Crutch, MD  DRUG ALLERGIES:  No Known Allergies  DISCHARGE MEDICATIONS:   Allergies as of 11/08/2017   No Known Allergies     Medication List    STOP taking these medications   amLODipine 5 MG tablet Commonly known as:  NORVASC   aspirin 81 MG tablet   cefdinir 300 MG capsule Commonly known as:  OMNICEF   megestrol 40 MG tablet Commonly known as:  MEGACE   metoprolol tartrate 50 MG tablet Commonly known as:  LOPRESSOR     TAKE these medications   apixaban 5 MG Tabs tablet Commonly known as:  ELIQUIS Take 1 tablet (5 mg total) by mouth 2 (two) times daily.   fenofibrate 160 MG tablet Take 160 mg by mouth daily.   KLOR-CON 10 10 MEQ tablet Generic drug:  potassium chloride Take 20 mEq by mouth daily.   latanoprost 0.005 % ophthalmic solution Commonly known as:  XALATAN USE 1 DROP IN EACH EYE AT BEDTIME   losartan 25 MG tablet Commonly known as:  COZAAR Take 2 tablets (50 mg total) by mouth daily. What changed:  medication strength  metFORMIN 500 MG tablet Commonly known as:  GLUCOPHAGE Take 500 mg by mouth 2 (two) times daily with a meal.   metoprolol succinate 50 MG 24 hr tablet Commonly known as:  TOPROL-XL Take 50 mg by mouth daily.   mirtazapine 30 MG tablet Commonly known as:  REMERON Take 30 mg by mouth at bedtime.   multivitamin-lutein Caps capsule Take 1 capsule by mouth daily. Start taking on:  11/09/2017   nutrition supplement (JUVEN) Pack Take 2 packets by mouth 2 (two) times daily between meals.   risperiDONE 1 MG tablet Commonly  known as:  RISPERDAL Take 1 tablet (1 mg total) by mouth at bedtime. What changed:  how much to take   torsemide 20 MG tablet Commonly known as:  DEMADEX Take 20 mg by mouth daily.            Durable Medical Equipment  (From admission, onward)        Start     Ordered   11/08/17 1005  For home use only DME oxygen  Once    Comments:  bleed oxygen 3 liters in with NIV Trilogy machine auto  Question Answer Comment  Mode or (Route) Nasal cannula   Liters per Minute 3   Frequency Continuous (stationary and portable oxygen unit needed)   Oxygen conserving device Yes   Oxygen delivery system Gas      11/08/17 1005       DISCHARGE INSTRUCTIONS:    Follow-up with pulmonology 2 weeks  follow-up with PMD 1 week  If you experience worsening of your admission symptoms, develop shortness of breath, life threatening emergency, suicidal or homicidal thoughts you must seek medical attention immediately by calling 911 or calling your MD immediately  if symptoms less severe.  You Must read complete instructions/literature along with all the possible adverse reactions/side effects for all the Medicines you take and that have been prescribed to you. Take any new Medicines after you have completely understood and accept all the possible adverse reactions/side effects.   Please note  You were cared for by a hospitalist during your hospital stay. If you have any questions about your discharge medications or the care you received while you were in the hospital after you are discharged, you can call the unit and asked to speak with the hospitalist on call if the hospitalist that took care of you is not available. Once you are discharged, your primary care physician will handle any further medical issues. Please note that NO REFILLS for any discharge medications will be authorized once you are discharged, as it is imperative that you return to your primary care physician (or establish a  relationship with a primary care physician if you do not have one) for your aftercare needs so that they can reassess your need for medications and monitor your lab values.    Today   CHIEF COMPLAINT:   Chief Complaint  Patient presents with  . Shortness of Breath    HISTORY OF PRESENT ILLNESS:  Jasmine PetrinDarlene Osborn  is a 67 y.o. female presented with shortness of breath  VITAL SIGNS:  Blood pressure (!) 145/75, pulse 92, temperature 98.3 F (36.8 C), temperature source Oral, resp. rate 18, height 5\' 4"  (1.626 m), weight (!) 137.4 kg (302 lb 14.4 oz), SpO2 95 %.   PHYSICAL EXAMINATION:  GENERAL:  67 y.o.-year-old patient lying in the bed with no acute distress.  EYES: Pupils equal, round, reactive to light and accommodation. No scleral icterus. Extraocular muscles  intact.  HEENT: Head atraumatic, normocephalic. Oropharynx and nasopharynx clear.  NECK:  Supple, no jugular venous distention. No thyroid enlargement, no tenderness.  LUNGS:  decreased breath sounds bilaterally, no wheezing, rales,rhonchi or crepitation. No use of accessory muscles of respiration.  CARDIOVASCULAR: S1, S2 normal. No murmurs, rubs, or gallops.  ABDOMEN: Soft, non-tender, non-distended. Bowel sounds present. No organomegaly or mass.  EXTREMITIES: No pedal edema, cyanosis, or clubbing.  NEUROLOGIC: Cranial nerves II through XII are intact. Muscle strength 5/5 in all extremities. Sensation intact. Gait not checked.  PSYCHIATRIC: The patient is alert and oriented x 3.  SKIN: No obvious rash, lesion, or ulcer.   DATA REVIEW:   CBC Recent Labs  Lab 11/07/17 0529  WBC 6.3  HGB 13.1  HCT 42.1  PLT 154    Chemistries  Recent Labs  Lab 11/03/17 0505 11/04/17 0451  11/07/17 0529  NA 139 140   < > 142  K 4.2 4.5   < > 4.2  CL 98* 100*   < > 100*  CO2 33* 32   < > 31  GLUCOSE 85 103*   < > 108*  BUN 32* 39*   < > 20  CREATININE 1.69* 1.88*   < > 1.04*  CALCIUM 8.9 8.8*   < > 9.8  MG 2.1  --   --    --   AST  --  14*  --   --   ALT  --  9*  --   --   ALKPHOS  --  45  --   --   BILITOT  --  0.9  --   --    < > = values in this interval not displayed.    Cardiac Enzymes Recent Labs  Lab 11/02/17 1204  TROPONINI <0.03    Microbiology Results  Results for orders placed or performed during the hospital encounter of 11/02/17  Culture, blood (routine x 2)     Status: None   Collection Time: 11/02/17 12:04 PM  Result Value Ref Range Status   Specimen Description   Final    BLOOD Blood Culture results may not be optimal due to an excessive volume of blood received in culture bottles   Special Requests   Final    BOTTLES DRAWN AEROBIC AND ANAEROBIC LEFT ANTECUBITAL   Culture   Final    NO GROWTH 5 DAYS Performed at [facility], [address] [place], [place] [address]    Report Status 11/07/2017 FINAL  Final  Culture, blood (routine x 2)     Status: None   Collection Time: 11/02/17 12:29 PM  Result Value Ref Range Status   Specimen Description BLOOD Blood Culture adequate volume  Final   Special Requests   Final    BOTTLES DRAWN AEROBIC AND ANAEROBIC BLOOD RIGHT HAND   Culture   Final    NO GROWTH 5 DAYS Performed at [facility], [address] [place], [place] [address]    Report Status 11/07/2017 FINAL  Final  MRSA PCR Screening     Status: None   Collection Time: 11/02/17  5:32 PM  Result Value Ref Range Status   MRSA by PCR NEGATIVE NEGATIVE Final    Comment:        The GeneXpert MRSA Assay (FDA approved for NASAL specimens only), is one component of a comprehensive MRSA colonization surveillance program. It is not intended to diagnose MRSA infection nor to guide or monitor treatment for MRSA infections.  Performed at [facility], [address] [place], [place] [address]   Urine Culture     Status: None   Collection Time: 11/03/17 12:23 AM  Result Value Ref Range Status   Specimen Description   Final    URINE,  RANDOM Performed at [facility], [address], [place], [place] [address]    Special Requests   Final    NONE Performed at [facility], [address] [place], [place] [address]    Culture   Final    NO GROWTH Performed at [facility], [address] [place], [place] [address]    Report Status 11/04/2017 FINAL  Final     Management plans discussed with the patient, family and they are in agreement.  CODE STATUS:     Code Status Orders  (From admission, onward)        Start     Ordered   11/04/17 1221  Do not attempt resuscitation (DNR)  Continuous    Question Answer Comment  In the event of cardiac or respiratory ARREST Do not call a "code blue"   In the event of cardiac or respiratory ARREST Do not perform Intubation, CPR, defibrillation or ACLS   In the event of cardiac or respiratory ARREST Use medication by any route, position, wound care, and other measures to relive pain and suffering. May use oxygen, suction and manual treatment of airway obstruction as needed for comfort.   Comments MOST form in chart      11/04/17 1221    Code Status History    Date Active Date Inactive Code Status Order ID Comments User Context   11/02/2017 17:31 11/04/2017 12:21 Full Code [id number]  Houston Siren, MD Inpatient   12/22/2016 18:25 01/03/2017 20:11 Full Code [id number]  Tora Kindred, DO ED      TOTAL TIME TAKING CARE OF THIS PATIENT: 35 minutes.    Alford Highland M.D on 11/08/2017 at 3:46 PM  Between 7am to 6pm - Pager - [phone]  After 6pm go to [email or web address] - password [email or web address]  [facility] Office  [phone]  CC: Primary care physician; Lauro Regulus, MD

## 2017-11-08 NOTE — Plan of Care (Signed)
  Progressing Education: Knowledge of General Education information will improve 11/08/2017 0224 - Progressing by Crisoforo OxfordKyei, Sabre Romberger, RN Health Behavior/Discharge Planning: Ability to manage health-related needs will improve 11/08/2017 0224 - Progressing by Crisoforo OxfordKyei, Osby Sweetin, RN Clinical Measurements: Ability to maintain clinical measurements within normal limits will improve 11/08/2017 0224 - Progressing by Crisoforo OxfordKyei, Otis Burress, RN Will remain free from infection 11/08/2017 0224 - Progressing by Crisoforo OxfordKyei, Hillarie Harrigan, RN Respiratory complications will improve 11/08/2017 0224 - Progressing by Crisoforo OxfordKyei, Adalynd Donahoe, RN Activity: Risk for activity intolerance will decrease 11/08/2017 0224 - Progressing by Crisoforo OxfordKyei, Roshanna Cimino, RN Nutrition: Adequate nutrition will be maintained 11/08/2017 0224 - Progressing by Crisoforo OxfordKyei, Anthany Thornhill, RN Coping: Level of anxiety will decrease 11/08/2017 0224 - Progressing by Crisoforo OxfordKyei, Vuong Musa, RN Elimination: Will not experience complications related to bowel motility 11/08/2017 0224 - Progressing by Crisoforo OxfordKyei, Pieter Fooks, RN Will not experience complications related to urinary retention 11/08/2017 0224 - Progressing by Crisoforo OxfordKyei, Dreama Kuna, RN Pain Managment: General experience of comfort will improve 11/08/2017 0224 - Progressing by Crisoforo OxfordKyei, Mertis Mosher, RN Safety: Ability to remain free from injury will improve 11/08/2017 0224 - Progressing by Crisoforo OxfordKyei, Larine Fielding, RN

## 2017-11-08 NOTE — Care Management Note (Signed)
Case Management Note  Patient Details  Name: Jasmine DustmanDarlene O Osborn MRN: [id number] Date of Birth: 12/11/1950  Subjective/Objective:    Trilogy delivered by [employer]. Home 02 and a portable 02 tank  to be delivered by [employer] today.                 Action/Plan:   Expected Discharge Date:  11/08/17               Expected Discharge Plan:  Home w Home Health Services  In-House Referral:     Discharge planning Services  CM Consult  Post Acute Care Choice:  Durable Medical Equipment, Home Health Choice offered to:  Spouse  DME Arranged:  NIV, Oxygen(Trilogy) DME Agency:  [facility]  HH Arranged:  RN, PT, OT, Nurse's Aide HH Agency:  [facility]  Status of Service:  Completed, signed off  If discussed at Long Length of Stay Meetings, dates discussed:    Additional Comments:  Laylee Schooley A, RN 11/08/2017, 2:01 PM

## 2017-11-08 NOTE — Care Management Note (Addendum)
Case Management Note  Patient Details  Name: Jasmine DustmanDarlene O Osborn MRN: [id number] Date of Birth: 10/31/1950  Subjective/Objective:     Referral for HH=RN, PT, OT, Aide sent to Shaune LeeksJermaine Jenkins at [facility]. Call to Chalmers CaterJames Bryant  from [employer] at his home. Trilogy will have to sent a Respiratory therapist [facility] to evaluate Jasmine Osborn in person before they can approve a Trilogy. Fayrene FearingJames is trying to get that evaluation accomplished today. Call to Jasmine Muralles's RN to request 02 sat measurements for new 02. Jasmine Osborn is now on 4L N/C.                Action/Plan:   Expected Discharge Date:  11/08/17               Expected Discharge Plan:  Home w Home Health Services  In-House Referral:     Discharge planning Services  CM Consult  Post Acute Care Choice:  Durable Medical Equipment, Home Health Choice offered to:  Spouse  DME Arranged:  NIV, Oxygen(Trilogy) DME Agency:  [facility]  HH Arranged:  RN, PT, OT, Nurse's Aide HH Agency:  [facility]  Status of Service:  Completed, signed off  If discussed at Long Length of Stay Meetings, dates discussed:    Additional Comments:  Donell Tomkins A, RN 11/08/2017, 9:11 AM

## 2017-11-08 NOTE — Progress Notes (Addendum)
Patient has been out of it most of the morning, very hard to keep her awake. Has orders for discharge. Trilogy machine has been delivered. Dr. Renae GlossWieting notified. Orders received for [facility]. Will continue to monitor.

## 2017-12-16 ENCOUNTER — Observation Stay
Admission: EM | Admit: 2017-12-16 | Discharge: 2017-12-17 | Disposition: A | Discharge status: Home / Self Care | Payer: Medicare HMO | Attending: Internal Medicine | Admitting: Internal Medicine

## 2017-12-16 ENCOUNTER — Other Ambulatory Visit: Payer: Self-pay

## 2017-12-16 ENCOUNTER — Encounter: Payer: Self-pay | Admitting: Emergency Medicine

## 2017-12-16 DIAGNOSIS — D649 Anemia, unspecified: Principal | ICD-10-CM | POA: Diagnosis present

## 2017-12-16 DIAGNOSIS — Z7901 Long term (current) use of anticoagulants: Secondary | ICD-10-CM | POA: Insufficient documentation

## 2017-12-16 DIAGNOSIS — J9611 Chronic respiratory failure with hypoxia: Secondary | ICD-10-CM | POA: Diagnosis not present

## 2017-12-16 DIAGNOSIS — E662 Morbid (severe) obesity with alveolar hypoventilation: Secondary | ICD-10-CM | POA: Insufficient documentation

## 2017-12-16 DIAGNOSIS — Z79899 Other long term (current) drug therapy: Secondary | ICD-10-CM | POA: Diagnosis not present

## 2017-12-16 DIAGNOSIS — F329 Major depressive disorder, single episode, unspecified: Secondary | ICD-10-CM | POA: Insufficient documentation

## 2017-12-16 DIAGNOSIS — R2681 Unsteadiness on feet: Secondary | ICD-10-CM | POA: Insufficient documentation

## 2017-12-16 DIAGNOSIS — Z6841 Body Mass Index (BMI) 40.0 and over, adult: Secondary | ICD-10-CM | POA: Insufficient documentation

## 2017-12-16 DIAGNOSIS — J9612 Chronic respiratory failure with hypercapnia: Secondary | ICD-10-CM | POA: Insufficient documentation

## 2017-12-16 DIAGNOSIS — R531 Weakness: Secondary | ICD-10-CM | POA: Insufficient documentation

## 2017-12-16 DIAGNOSIS — H409 Unspecified glaucoma: Secondary | ICD-10-CM | POA: Diagnosis not present

## 2017-12-16 DIAGNOSIS — I10 Essential (primary) hypertension: Secondary | ICD-10-CM | POA: Insufficient documentation

## 2017-12-16 DIAGNOSIS — I481 Persistent atrial fibrillation: Secondary | ICD-10-CM | POA: Insufficient documentation

## 2017-12-16 DIAGNOSIS — N179 Acute kidney failure, unspecified: Secondary | ICD-10-CM | POA: Insufficient documentation

## 2017-12-16 DIAGNOSIS — Z87891 Personal history of nicotine dependence: Secondary | ICD-10-CM | POA: Insufficient documentation

## 2017-12-16 DIAGNOSIS — E1151 Type 2 diabetes mellitus with diabetic peripheral angiopathy without gangrene: Secondary | ICD-10-CM | POA: Insufficient documentation

## 2017-12-16 DIAGNOSIS — N939 Abnormal uterine and vaginal bleeding, unspecified: Secondary | ICD-10-CM | POA: Insufficient documentation

## 2017-12-16 DIAGNOSIS — Z7984 Long term (current) use of oral hypoglycemic drugs: Secondary | ICD-10-CM | POA: Insufficient documentation

## 2017-12-16 HISTORY — DX: Morbid (severe) obesity with alveolar hypoventilation: E66.2

## 2017-12-16 HISTORY — DX: Unspecified atrial fibrillation: I48.91

## 2017-12-16 HISTORY — DX: Chronic respiratory failure with hypoxia: J96.11

## 2017-12-16 HISTORY — DX: Chronic respiratory failure with hypercapnia: J96.12

## 2017-12-16 LAB — COMPREHENSIVE METABOLIC PANEL WITH GFR
ALT: 11 U/L — ABNORMAL LOW (ref 14–54)
AST: 28 U/L (ref 15–41)
Albumin: 3.6 g/dL (ref 3.5–5.0)
Alkaline Phosphatase: 62 U/L (ref 38–126)
Anion gap: 9 (ref 5–15)
BUN: 23 mg/dL — ABNORMAL HIGH (ref 6–20)
CO2: 27 mmol/L (ref 22–32)
Calcium: 9.2 mg/dL (ref 8.9–10.3)
Chloride: 105 mmol/L (ref 101–111)
Creatinine, Ser: 1.44 mg/dL — ABNORMAL HIGH (ref 0.44–1.00)
GFR calc Af Amer: 42 mL/min — ABNORMAL LOW
GFR calc non Af Amer: 37 mL/min — ABNORMAL LOW
Glucose, Bld: 162 mg/dL — ABNORMAL HIGH (ref 65–99)
Potassium: 4.6 mmol/L (ref 3.5–5.1)
Sodium: 141 mmol/L (ref 135–145)
Total Bilirubin: 0.5 mg/dL (ref 0.3–1.2)
Total Protein: 6.7 g/dL (ref 6.5–8.1)

## 2017-12-16 LAB — CBC WITH DIFFERENTIAL/PLATELET
Basophils Absolute: 0 10*3/uL (ref 0–0.1)
Basophils Relative: 0 %
EOS PCT: 3 %
Eosinophils Absolute: 0.2 10*3/uL (ref 0–0.7)
HEMATOCRIT: 17.5 % — AB (ref 35.0–47.0)
Hemoglobin: 5.8 g/dL — ABNORMAL LOW (ref 12.0–16.0)
LYMPHS PCT: 13 %
Lymphs Abs: 0.9 10*3/uL — ABNORMAL LOW (ref 1.0–3.6)
MCH: 29.2 pg (ref 26.0–34.0)
MCHC: 32.9 g/dL (ref 32.0–36.0)
MCV: 88.8 fL (ref 80.0–100.0)
MONO ABS: 0.3 10*3/uL (ref 0.2–0.9)
MONOS PCT: 5 %
Neutro Abs: 5.4 10*3/uL (ref 1.4–6.5)
Neutrophils Relative %: 79 %
PLATELETS: 217 10*3/uL (ref 150–440)
RBC: 1.97 MIL/uL — ABNORMAL LOW (ref 3.80–5.20)
RDW: 21.3 % — AB (ref 11.5–14.5)
WBC: 6.8 10*3/uL (ref 3.6–11.0)

## 2017-12-16 LAB — FERRITIN: FERRITIN: 27 ng/mL (ref 11–307)

## 2017-12-16 LAB — PREPARE RBC (CROSSMATCH)

## 2017-12-16 MED ORDER — MIRTAZAPINE 15 MG PO TABS
30.0000 mg | ORAL_TABLET | Freq: Every day | ORAL | Status: DC
  Administered 2017-12-16: 30 mg via ORAL
  Filled 2017-12-16: qty 2

## 2017-12-16 MED ORDER — RISPERIDONE 1 MG PO TABS
1.5000 mg | ORAL_TABLET | Freq: Every day | ORAL | Status: DC
  Administered 2017-12-16: 1.5 mg via ORAL
  Filled 2017-12-16 (×2): qty 1.5

## 2017-12-16 MED ORDER — SODIUM CHLORIDE 0.9 % IV SOLN
Freq: Once | INTRAVENOUS | Status: AC
  Administered 2017-12-16: 18:00:00 via INTRAVENOUS

## 2017-12-16 MED ORDER — LATANOPROST 0.005 % OP SOLN
1.0000 [drp] | Freq: Every day | OPHTHALMIC | Status: DC
  Administered 2017-12-16: 1 [drp] via OPHTHALMIC
  Filled 2017-12-16: qty 2.5

## 2017-12-16 MED ORDER — JUVEN PO PACK
2.0000 | PACK | Freq: Two times a day (BID) | ORAL | Status: DC
  Administered 2017-12-17: 2 via ORAL

## 2017-12-16 MED ORDER — FERUMOXYTOL INJECTION 510 MG/17 ML
510.0000 mg | Freq: Once | INTRAVENOUS | Status: AC
  Administered 2017-12-17: 510 mg via INTRAVENOUS
  Filled 2017-12-16: qty 17

## 2017-12-16 MED ORDER — POTASSIUM CHLORIDE ER 10 MEQ PO TBCR
20.0000 meq | EXTENDED_RELEASE_TABLET | Freq: Every day | ORAL | Status: DC
  Administered 2017-12-17: 20 meq via ORAL
  Filled 2017-12-16 (×2): qty 2

## 2017-12-16 MED ORDER — FENOFIBRATE 160 MG PO TABS
160.0000 mg | ORAL_TABLET | Freq: Every day | ORAL | Status: DC
  Administered 2017-12-17: 160 mg via ORAL
  Filled 2017-12-16: qty 1

## 2017-12-16 MED ORDER — MEGESTROL ACETATE 20 MG PO TABS
80.0000 mg | ORAL_TABLET | Freq: Two times a day (BID) | ORAL | Status: DC
  Administered 2017-12-17: 80 mg via ORAL
  Filled 2017-12-16 (×2): qty 2

## 2017-12-16 MED ORDER — FUROSEMIDE 10 MG/ML IJ SOLN
40.0000 mg | Freq: Once | INTRAMUSCULAR | Status: AC
  Administered 2017-12-16: 40 mg via INTRAVENOUS
  Filled 2017-12-16: qty 4

## 2017-12-16 MED ORDER — ACETAMINOPHEN 325 MG PO TABS: 650.0000 mg | ORAL_TABLET | Freq: Four times a day (QID) | ORAL | Status: DC | PRN

## 2017-12-16 MED ORDER — ONDANSETRON HCL 4 MG PO TABS: 4.0000 mg | ORAL_TABLET | Freq: Four times a day (QID) | ORAL | Status: DC | PRN

## 2017-12-16 MED ORDER — MEGESTROL ACETATE 40 MG PO TABS
80.0000 mg | ORAL_TABLET | Freq: Every day | ORAL | Status: DC
  Administered 2017-12-16: 80 mg via ORAL
  Filled 2017-12-16: qty 2

## 2017-12-16 MED ORDER — LOSARTAN POTASSIUM 50 MG PO TABS
50.0000 mg | ORAL_TABLET | Freq: Every day | ORAL | Status: DC
  Administered 2017-12-17: 50 mg via ORAL
  Filled 2017-12-16: qty 1

## 2017-12-16 MED ORDER — ACETAMINOPHEN 650 MG RE SUPP: 650.0000 mg | Freq: Four times a day (QID) | RECTAL | Status: DC | PRN

## 2017-12-16 MED ORDER — OCUVITE-LUTEIN PO CAPS
1.0000 | ORAL_CAPSULE | Freq: Every day | ORAL | Status: DC
  Administered 2017-12-17: 1 via ORAL
  Filled 2017-12-16: qty 1

## 2017-12-16 MED ORDER — TORSEMIDE 20 MG PO TABS
20.0000 mg | ORAL_TABLET | Freq: Every day | ORAL | Status: DC
  Administered 2017-12-17: 20 mg via ORAL
  Filled 2017-12-16: qty 1

## 2017-12-16 MED ORDER — METOPROLOL SUCCINATE ER 50 MG PO TB24
50.0000 mg | ORAL_TABLET | Freq: Every day | ORAL | Status: DC
  Administered 2017-12-17: 50 mg via ORAL
  Filled 2017-12-16: qty 1

## 2017-12-16 MED ORDER — METFORMIN HCL 500 MG PO TABS
500.0000 mg | ORAL_TABLET | Freq: Two times a day (BID) | ORAL | Status: DC
  Administered 2017-12-17: 500 mg via ORAL
  Filled 2017-12-16: qty 1

## 2017-12-16 MED ORDER — ONDANSETRON HCL 4 MG/2ML IJ SOLN: 4.0000 mg | Freq: Four times a day (QID) | INTRAMUSCULAR | Status: DC | PRN

## 2017-12-16 NOTE — ED Notes (Signed)
Blue top sent to lab. 

## 2017-12-16 NOTE — ED Provider Notes (Signed)
[facility] Emergency Department Provider Note ____________________________________________   First MD Initiated Contact with Patient 12/16/17 1716     (approximate)  I have reviewed the triage vital signs and the nursing notes.   HISTORY  Chief Complaint Vaginal Bleeding    HPI Jasmine Osborn is a 67 y.o. female with history of endometrial hyperplasia with chronic vaginal bleeding, and other PMH as noted below who presents referred for anemia, with hemoglobin measured below 6, acute onset, and associated with vaginal bleeding.  Per the patient, she was previously on Megace for the bleeding with improved symptoms, and restriction of medroxyprogesterone.  She states that the current bleeding started about 12 days ago, but has actually subsided over the last day.  She stopped the medroxyprogesterone after this episode of bleeding started.  She was instructed by her OB/GYN Dr. Dalbert GarnetBeasley to switch back to Megace but had not done it yet.  The patient is also on Eliquis but self-discontinued about 5 days ago.  The patient reports associated generalized weakness and lightheadedness as well as some shortness of breath, but denies chest pain.  She denies any other abnormal bleeding or bruising.   Past Medical History:  Diagnosis Date  . Depression   . Diabetes mellitus without complication (HCC)   . Elevated lipids   . Hernia of abdominal wall   . Hypertension   . Increased endometrial stripe thickness 06/04/2017  . Increased endometrial stripe thickness 06/04/2017  . Psychosis (HCC)   . PVD (peripheral vascular disease) (HCC)    heel ulcer Lt     Patient Active Problem List   Diagnosis Date Noted  . Anemia 12/16/2017  . Persistent atrial fibrillation (HCC) 11/05/2017  . Morbid obesity (HCC) 11/05/2017  . Acute on chronic respiratory failure with hypoxia and hypercapnia (HCC) 11/02/2017  . Increased endometrial stripe thickness 06/04/2017  . Pressure  injury of skin 12/23/2016  . Respiratory failure with hypercapnia (HCC) 12/22/2016    Past Surgical History:  Procedure Laterality Date  . COLON SURGERY    . DILATION AND CURETTAGE OF UTERUS    . HERNIA REPAIR    . TIBIA FRACTURE SURGERY Left     Prior to Admission medications   Medication Sig Start Date End Date Taking? Authorizing Provider  apixaban (ELIQUIS) 5 MG TABS tablet Take 1 tablet (5 mg total) by mouth 2 (two) times daily. 11/08/17  Yes Wieting, Richard, MD  fenofibrate 160 MG tablet Take 160 mg by mouth daily.   Yes [provider]  KLOR-CON 10 10 MEQ tablet Take 20 mEq by mouth daily.  10/22/16  Yes [provider]  latanoprost (XALATAN) 0.005 % ophthalmic solution [facility] 05/18/15  Yes [provider]  losartan (COZAAR) 25 MG tablet Take 2 tablets (50 mg total) by mouth daily. 11/08/17  Yes Wieting, Richard, MD  medroxyPROGESTERone (PROVERA) 5 MG tablet Take 5 mg by mouth daily.   Yes [provider]  metFORMIN (GLUCOPHAGE) 500 MG tablet Take 500 mg by mouth 2 (two) times daily with a meal.   Yes [provider]  metoprolol succinate (TOPROL-XL) 50 MG 24 hr tablet Take 50 mg by mouth daily. 09/04/17  Yes [provider]  mirtazapine (REMERON) 30 MG tablet Take 30 mg by mouth at bedtime.   Yes [provider]  multivitamin-lutein (OCUVITE-LUTEIN) CAPS capsule Take 1 capsule by mouth daily. 11/09/17  Yes Alford HighlandWieting, Richard, MD  nutrition supplement, Heinz KnucklesJUVEN, (JUVEN) PACK Take  2 packets by mouth 2 (two) times daily between meals. 11/08/17  Yes Wieting, Richard, MD  risperiDONE (RISPERDAL) 1 MG tablet Take 1 tablet (1 mg total) by mouth at bedtime. Patient taking differently: Take 1.5 mg by mouth at bedtime.  01/03/17  Yes Enid Baas, MD  torsemide (DEMADEX) 20 MG tablet Take 20 mg by mouth daily.   Yes [provider]  megestrol (MEGACE) 40 MG tablet Take 80 mg by mouth 2 (two) times  daily. 12/08/17   [provider]    Allergies Patient has no known allergies.  Family History  Problem Relation Age of Onset  . Dementia Mother   . Valvular heart disease Father     Social History Social History   Tobacco Use  . Smoking status: Former Games developer  . Smokeless tobacco: Never Used  Substance Use Topics  . Alcohol use: No  . Drug use: No    Review of Systems  Constitutional: Positive for generalized weakness. Eyes: No redness. ENT: No sore throat. Cardiovascular: Denies chest pain. Respiratory: Positive for shortness of breath. Gastrointestinal: Negative for abdominal pain.  Genitourinary: Positive for vaginal bleeding.  Musculoskeletal: Negative for back pain. Skin: Negative for rash. Neurological: Negative for headache.   ____________________________________________   PHYSICAL EXAM:  VITAL SIGNS: ED Triage Vitals  Enc Vitals Group     BP 12/16/17 1629 (!) 121/56     Pulse Rate 12/16/17 1629 (!) 106     Resp --      Temp 12/16/17 1629 97.8 F (36.6 C)     Temp Source 12/16/17 1629 Oral     SpO2 12/16/17 1629 93 %     Weight 12/16/17 1630 274 lb (124.3 kg)     Height 12/16/17 1630 5\' 4"  (1.626 m)     Head Circumference --      Peak Flow --      Pain Score --      Pain Loc --      Pain Edu? --      Excl. in GC? --     Constitutional: Alert and oriented. Well appearing and in no acute distress. Eyes: Conjunctivae are pale. Head: Atraumatic. Nose: No congestion/rhinnorhea. Mouth/Throat: Mucous membranes are moist.   Neck: Normal range of motion.  Cardiovascular: Normal rate, regular rhythm. Grossly normal heart sounds.  Good peripheral circulation. Respiratory: Normal respiratory effort.  No retractions. Lungs CTAB. Gastrointestinal: Soft and nontender.  Large chronic abdominal hernia which is nontender. Genitourinary: No flank tenderness. Musculoskeletal: No lower extremity edema.  Extremities warm and well perfused.  Neurologic:   Normal speech and language. No gross focal neurologic deficits are appreciated.  Skin:  Skin is warm and dry.  Pale.  No rash noted. Psychiatric: Mood and affect are normal. Speech and behavior are normal.  ____________________________________________   LABS (all labs ordered are listed, but only abnormal results are displayed)  Labs Reviewed  CBC WITH DIFFERENTIAL/PLATELET - Abnormal; Notable for the following components:      Result Value   RBC 1.97 (*)    Hemoglobin 5.8 (*)    HCT 17.5 (*)    RDW 21.3 (*)    Lymphs Abs 0.9 (*)    All other components within normal limits  COMPREHENSIVE METABOLIC PANEL - Abnormal; Notable for the following components:   Glucose, Bld 162 (*)    BUN 23 (*)    Creatinine, Ser 1.44 (*)    ALT 11 (*)    GFR calc non Af Amer 37 (*)  GFR calc Af Amer 42 (*)    All other components within normal limits  FERRITIN  VITAMIN B12  TYPE AND SCREEN  PREPARE RBC (CROSSMATCH)   ____________________________________________  EKG   ____________________________________________  RADIOLOGY    ____________________________________________   PROCEDURES  Procedure(s) performed: No  Procedures  Critical Care performed: No ____________________________________________   INITIAL IMPRESSION / ASSESSMENT AND PLAN / ED COURSE  Pertinent labs & imaging results that were available during my care of the patient were reviewed by me and considered in my medical decision making (see chart for details).   Clinical Course as of Dec 17 1815  Tue Dec 16, 2017  1725 Potassium: 4.6 [SS]    Clinical Course User Index [SS] Dionne Bucy, MD   67 year old female with history of endometrial hyperplasia and chronic vaginal bleeding as well as other PMH as noted above presents with anemia with hemoglobin measured below 6 today at her primary care doctor's office, in the context of increased vaginal bleeding over the last 12 days.  Patient states that the  bleeding has somewhat subsided today.  She reports generalized weakness and some shortness of breath.  No other abnormal bleeding or bruising.  I reviewed the past medical records in Epic including OB/GYN notes from Dr. Dalbert Garnet and confirmed the history as given above.  On exam, the patient has pale skin and conjunctiva, she is borderline tachycardic, but she is otherwise relatively well-appearing, and the vital signs are otherwise normal.  She has no active hemorrhage at this time.  I consulted Dr. Elesa Massed from OB/GYN who recommended restarting the patient on Megace.  Given the patient's symptomatic anemia, she will require transfusion and admission.  ----------------------------------------- 6:17 PM on 12/16/2017 -----------------------------------------  I signed the patient out to the hospitalist Dr. Renae Gloss.  ____________________________________________   FINAL CLINICAL IMPRESSION(S) / ED DIAGNOSES  Final diagnoses:  Symptomatic anemia  Vaginal bleeding      NEW MEDICATIONS STARTED DURING THIS VISIT:  New Prescriptions   No medications on file     Note:  This document was prepared using Dragon voice recognition software and may include unintentional dictation errors.    Dionne Bucy, MD 12/16/17 [id number]

## 2017-12-16 NOTE — H&P (Signed)
[facility] [place] [facility]   PATIENT NAME: Jasmine PetrinDarlene Osborn    MR#:  [id number]  DATE OF BIRTH:  11/18/1950  DATE OF ADMISSION:  12/16/2017  PRIMARY CARE PHYSICIAN: [name], Marshall W, MD   REQUESTING/REFERRING PHYSICIAN: Dr Dionne BucySebastian Siadecki  CHIEF COMPLAINT:   Chief Complaint  Patient presents with  . Vaginal Bleeding    HISTORY OF PRESENT ILLNESS:  Jasmine PetrinDarlene Osborn  is a 67 y.o. female with a known history of chronic respiratory failure with hypoxia and hypercapnia and transient atrial fibrillation.  She was on Eliquis up until about 3 days ago when she stopped it because of vaginal bleeding.  She has been passing a lot of clots.  The bleeding over the last day or so seems to have settled down.  She was just changed over to Megace by Dr. Marney SettingBeasley   Gynecologist.  She saw Dr. Einar CrowMarshall Anderson today and had some blood work and was sent into the ER for transfusion with low hemoglobin.  When she walks she gets short of breath and weak.  In the ER, her hemoglobin was 5.8 and hospitalist services were contacted for further evaluation.  PAST MEDICAL HISTORY:   Past Medical History:  Diagnosis Date  . Atrial fibrillation (HCC)   . Chronic respiratory failure with hypoxia and hypercapnia (HCC)   . Depression   . Diabetes mellitus without complication (HCC)   . Elevated lipids   . Hernia of abdominal wall   . Hypertension   . Increased endometrial stripe thickness 06/04/2017  . Increased endometrial stripe thickness 06/04/2017  . Obesity hypoventilation syndrome (HCC)   . Psychosis (HCC)   . PVD (peripheral vascular disease) (HCC)    heel ulcer Lt     PAST SURGICAL HISTORY:   Past Surgical History:  Procedure Laterality Date  . COLON SURGERY    . DILATION AND CURETTAGE OF UTERUS    . HERNIA REPAIR    . TIBIA FRACTURE SURGERY Left     SOCIAL HISTORY:   Social History   Tobacco Use  . Smoking status: Former Games developermoker  . Smokeless  tobacco: Never Used  Substance Use Topics  . Alcohol use: No    FAMILY HISTORY:   Family History  Problem Relation Age of Onset  . Dementia Mother   . Valvular heart disease Father     DRUG ALLERGIES:  No Known Allergies  REVIEW OF SYSTEMS:  CONSTITUTIONAL: No fever.  Positive chills.  Positive for weight gain.  Positive for fatigue.  EYES: No blurred or double vision.  Wears glasses. EARS, NOSE, AND THROAT: No tinnitus or ear pain. No sore throat.  Unable to hear well out of the left ear RESPIRATORY: No cough, shortness of breath, wheezing or hemoptysis.  CARDIOVASCULAR: No chest pain, orthopnea, edema.  GASTROINTESTINAL: No nausea, vomiting, diarrhea or abdominal pain. No blood in bowel movements.  Large hernia GENITOURINARY: No dysuria, hematuria.  ENDOCRINE: No polyuria, nocturia,  HEMATOLOGY: No anemia, easy bruising or bleeding SKIN:  small ulcer left heel MUSCULOSKELETAL: No joint pain or arthritis.   NEUROLOGIC: No tingling, numbness, weakness.  PSYCHIATRY: No anxiety or depression.   MEDICATIONS AT HOME:   Prior to Admission medications   Medication Sig Start Date End Date Taking? Authorizing Provider  apixaban (ELIQUIS) 5 MG TABS tablet Take 1 tablet (5 mg total) by mouth 2 (two) times daily. 11/08/17  Yes Valen Mascaro, MD  fenofibrate 160 MG tablet Take 160 mg by mouth daily.   Yes [provider]  KLOR-CON 10 10 MEQ tablet Take 20 mEq by mouth daily.  10/22/16  Yes [provider]  latanoprost (XALATAN) 0.005 % ophthalmic solution USE 1 DROP IN [facility] EYE AT BEDTIME 05/18/15  Yes [provider]  losartan (COZAAR) 25 MG tablet Take 2 tablets (50 mg total) by mouth daily. 11/08/17  Yes Mar Walmer, MD  medroxyPROGESTERone (PROVERA) 5 MG tablet Take 5 mg by mouth daily.   Yes [provider]  metFORMIN (GLUCOPHAGE) 500 MG tablet Take 500 mg by mouth 2 (two) times daily with a meal.   Yes [provider]  metoprolol  succinate (TOPROL-XL) 50 MG 24 hr tablet Take 50 mg by mouth daily. 09/04/17  Yes [provider]  mirtazapine (REMERON) 30 MG tablet Take 30 mg by mouth at bedtime.   Yes [provider]  multivitamin-lutein (OCUVITE-LUTEIN) CAPS capsule Take 1 capsule by mouth daily. 11/09/17  Yes Alixander Rallis, MD  nutrition supplement, JUVEN, (JUVEN) PACK Take 2 packets by mouth 2 (two) times daily between meals. 11/08/17  Yes Riyanna Crutchley, MD  risperiDONE (RISPERDAL) 1 MG tablet Take 1 tablet (1 mg total) by mouth at bedtime. Patient taking differently: Take 1.5 mg by mouth at bedtime.  01/03/17  Yes Enid Baas, MD  torsemide (DEMADEX) 20 MG tablet Take 20 mg by mouth daily.   Yes [provider]  megestrol (MEGACE) 40 MG tablet Take 80 mg by mouth 2 (two) times daily. 12/08/17   [provider]      VITAL SIGNS:  Blood pressure (!) 155/73, pulse 96, temperature 98.1 F (36.7 C), temperature source Oral, resp. rate 20, height 5\' 4"  (1.626 m), weight 124.3 kg (274 lb), SpO2 100 %.  PHYSICAL EXAMINATION:  GENERAL:  67 y.o.-year-old patient lying in the bed with no acute distress.  EYES: Pupils equal, round, reactive to light and accommodation. No scleral icterus. Extraocular muscles intact.  HEENT: Head atraumatic, normocephalic. Oropharynx and nasopharynx clear.  NECK:  Supple, no jugular venous distention. No thyroid enlargement, no tenderness.  LUNGS: Decreased breath sounds bilaterally, no wheezing, rales,rhonchi or crepitation. No use of accessory muscles of respiration.  CARDIOVASCULAR: S1, S2 normal. No murmurs, rubs, or gallops.  ABDOMEN: Soft, nontender, distended. Bowel sounds present. No organomegaly or mass.  Large ventral hernia EXTREMITIES:  3+ pedal edema, no cyanosis, or clubbing.  NEUROLOGIC: Cranial nerves II through XII are intact. Muscle strength 5/5 in all extremities. Sensation intact. Gait not checked.  PSYCHIATRIC: The patient is alert  and oriented x 3.  SKIN: Ulcer on the left heel that seems to be healing at this point  LABORATORY PANEL:   CBC Recent Labs  Lab 12/16/17 1636  WBC 6.8  HGB 5.8*  HCT 17.5*  PLT 217   ------------------------------------------------------------------------------------------------------------------  Chemistries  Recent Labs  Lab 12/16/17 1636  NA 141  K 4.6  CL 105  CO2 27  GLUCOSE 162*  BUN 23*  CREATININE 1.44*  CALCIUM 9.2  AST 28  ALT 11*  ALKPHOS 62  BILITOT 0.5   ------------------------------------------------------------------------------------------------------------------      EKG:   Ordered by me  IMPRESSION AND PLAN:   1.  Acute hemorrhagic anemia.  Hemoglobin was 13.1 on 11/07/2017 and today is 5.8.  Eliquis stopped by the patient 3 days ago.  Megace restarted by gynecology and I will continue for her source of vaginal bleeding.  Benefits and risks of blood transfusion explained.  Patient ordered 2 units of blood.  I will also  give IV iron tomorrow morning Feraheme.  Admit as observation.  Follow-up with Dr. Dalbert Garnet gynecology as outpatient. 2.  Chronic respiratory failure with hypoxia and hypercapnia.  Underlying obesity hypoventilation syndrome.  Patient on 3 L of oxygen at rest and 4 L with ambulation.  Patient on trilogy machine at night. 3.  History of atrial fibrillation.  Obtain an EKG.  Patient on Toprol.  Eliquis on hold.  Likely will have to be on hold for a period of time.   4.  Essential hypertension on metoprolol, losartan and torsemide 5.  Type 2 diabetes mellitus without complication on metformin 6.  Glaucoma on eyedrops 7.  Depression on Remeron and Risperdal 8.  Acute kidney injury.  Monitor after blood transfusion 9.  Weakness we will get physical therapy evaluation  I have reviewed the laboratory data.  I have ordered an EKG.  Management plans discussed with the patient, family and they are in agreement.  CODE STATUS:  Full  Code  TOTAL TIME TAKING CARE OF THIS PATIENT: 50 minutes.    Alford Highland M.D on 12/16/2017 at 6:50 PM  Between 7am to 6pm - Pager - [phone]  After 6pm call admission pager [phone]  [facility]  [phone]  CC: Primary care physician; Lauro Regulus, MD

## 2017-12-16 NOTE — ED Triage Notes (Addendum)
Pt with vaginal bleeding x 12 days. Pt was on medroxyprogesterone until bleeding started. Pt went to edp and had bloodwork today. hgb was 5.5. Pt was told to come to ED. Pt is pale, SOB with minimal excersion.  resp 28 pm

## 2017-12-16 NOTE — Progress Notes (Signed)
Patient ID: Jasmine Osborn, female   DOB: 08/11/1951, 67 y.o.   MRN: [id number]  ACP note  Diagnosis; acute blood loss anemia, chronic respiratory failure with hypoxia and hypercapnia, and obesity hypoventilation syndrome, history of atrial fibrillation,Hypertension, diabetes, depression, acute kidney injury.  CODE STATUS discussed.  Patient and husband would like to be a full code.  Plan; transfuse 2 units of packed red blood cells and check hemoglobin tomorrow.  Also give IV iron.  Follow-up with gynecology as outpatient.  Megace can increase risk of blood clot.  Time spent on ACP discussion 17 minutes  Dr. Alford Highlandichard Othal Kubitz

## 2017-12-16 NOTE — ED Notes (Signed)
Hospitalist in room with patient

## 2017-12-17 DIAGNOSIS — D649 Anemia, unspecified: Secondary | ICD-10-CM | POA: Diagnosis not present

## 2017-12-17 LAB — BASIC METABOLIC PANEL
Anion gap: 9 (ref 5–15)
BUN: 22 mg/dL — AB (ref 6–20)
CALCIUM: 8.8 mg/dL — AB (ref 8.9–10.3)
CO2: 29 mmol/L (ref 22–32)
Chloride: 104 mmol/L (ref 101–111)
Creatinine, Ser: 1.35 mg/dL — ABNORMAL HIGH (ref 0.44–1.00)
GFR calc Af Amer: 46 mL/min — ABNORMAL LOW (ref >60)
GFR, EST NON AFRICAN AMERICAN: 40 mL/min — AB (ref >60)
Glucose, Bld: 146 mg/dL — ABNORMAL HIGH (ref 65–99)
POTASSIUM: 3.6 mmol/L (ref 3.5–5.1)
SODIUM: 142 mmol/L (ref 135–145)

## 2017-12-17 LAB — CBC
HEMATOCRIT: 23.8 % — AB (ref 35.0–47.0)
Hemoglobin: 7.7 g/dL — ABNORMAL LOW (ref 12.0–16.0)
MCH: 28.4 pg (ref 26.0–34.0)
MCHC: 32.2 g/dL (ref 32.0–36.0)
MCV: 88.3 fL (ref 80.0–100.0)
Platelets: 198 10*3/uL (ref 150–440)
RBC: 2.7 MIL/uL — ABNORMAL LOW (ref 3.80–5.20)
RDW: 18.7 % — AB (ref 11.5–14.5)
WBC: 6.2 10*3/uL (ref 3.6–11.0)

## 2017-12-17 LAB — HEMOGLOBIN AND HEMATOCRIT, BLOOD
HCT: 26.9 % — ABNORMAL LOW (ref 35.0–47.0)
Hemoglobin: 8.9 g/dL — ABNORMAL LOW (ref 12.0–16.0)

## 2017-12-17 LAB — PREPARE RBC (CROSSMATCH)

## 2017-12-17 LAB — VITAMIN B12: Vitamin B-12: 146 pg/mL — ABNORMAL LOW (ref 180–914)

## 2017-12-17 MED ORDER — SODIUM CHLORIDE 0.9 % IV SOLN
Freq: Once | INTRAVENOUS | Status: AC
  Administered 2017-12-17: 13:00:00 via INTRAVENOUS

## 2017-12-17 MED ORDER — FUROSEMIDE 10 MG/ML IJ SOLN
20.0000 mg | Freq: Once | INTRAMUSCULAR | Status: AC
  Administered 2017-12-17: 20 mg via INTRAVENOUS
  Filled 2017-12-17: qty 4

## 2017-12-17 NOTE — Care Management (Signed)
Patient admitted from home with anemia. Patient lives at home with husband.  Chronic O2 with [employer].  Husband to bring portable tank for discharge.  Patient has [facility] through [employer].  Patient has been assessed and recommends home health PT.  Patient has Rw in the home.  Patient has previously been open with [facility].  Patient declines any home health services this discharge.  RNCM signing off.

## 2017-12-17 NOTE — Care Management Obs Status (Signed)
MEDICARE OBSERVATION STATUS NOTIFICATION   Patient Details  Name: Jasmine Osborn MRN: [id number] Date of Birth: 03/18/1951   Medicare Observation Status Notification Given:  No(admitted obs less than 24 hours)    Chapman FitchBOWEN, Meela Wareing T, RN 12/17/2017, 12:07 PM

## 2017-12-17 NOTE — Discharge Summary (Signed)
[facility] - [place] at [facility] [place], 67 y.o., DOB July 01, 1951, MRN [id number]. Admission date: 12/16/2017 Discharge Date 12/17/2017 Primary MD Lauro Regulus, MD Admitting Physician Alford Highland, MD  Admission Diagnosis  Vaginal bleeding [N93.9] Symptomatic anemia [D64.9]  Discharge Diagnosis   Active Problems: Symptomatic anemia related to vaginal bleeding Chronic respiratory failure with hypoxia and hypercapnia  history of atrial fibrillation Essential hypertension Diabetes type 2  Glaucoma Depression Acute kidney injury       Hospital Course   Jasmine Osborn  is a 68 y.o. female with a known history of chronic respiratory failure with hypoxia and hypercapnia and transient atrial fibrillation.  She was on Eliquis up until about 3 days ago when she stopped it because of vaginal bleeding.  She has been passing a lot of clots.  Patient was admitted because she had symptomatic anemia.  She transfused 3 units of packed RBCs and wants to go home.  She needs to follow-up with her primary GYN physician regarding vaginal bleeding.  At this point due to bleeding Eliquis has been discontinued.  Her primary concern physician needs to decide whether this would be appropriate drug for her in the near future.             Consults  None  Significant Tests:  See full reports for all details     No results found.     Today   Subjective:   Jasmine Osborn patient feeling much better no further vaginal bleeding very anxious to go home  Objective:   Blood pressure (!) 104/59, pulse 74, temperature 98 F (36.7 C), temperature source Oral, resp. rate 17, height 5\' 4"  (1.626 m), weight 274 lb (124.3 kg), SpO2 94 %.  .  Intake/Output Summary (Last 24 hours) at 12/17/2017 1457 Last data filed at 12/17/2017 1424 Gross per 24 hour  Intake 1273 ml  Output 3100 ml  Net -1827 ml    Exam VITAL SIGNS: Blood pressure (!) 104/59, pulse 74,  temperature 98 F (36.7 C), temperature source Oral, resp. rate 17, height 5\' 4"  (1.626 m), weight 274 lb (124.3 kg), SpO2 94 %.  GENERAL:  67 y.o.-year-old patient lying in the bed with no acute distress.  EYES: Pupils equal, round, reactive to light and accommodation. No scleral icterus. Extraocular muscles intact.  HEENT: Head atraumatic, normocephalic. Oropharynx and nasopharynx clear.  NECK:  Supple, no jugular venous distention. No thyroid enlargement, no tenderness.  LUNGS: Normal breath sounds bilaterally, no wheezing, rales,rhonchi or crepitation. No use of accessory muscles of respiration.  CARDIOVASCULAR: S1, S2 normal. No murmurs, rubs, or gallops.  ABDOMEN: Soft, nontender, nondistended. Bowel sounds present. No organomegaly or mass.  EXTREMITIES: No pedal edema, cyanosis, or clubbing.  NEUROLOGIC: Cranial nerves II through XII are intact. Muscle strength 5/5 in all extremities. Sensation intact. Gait not checked.  PSYCHIATRIC: The patient is alert and oriented x 3.  SKIN: No obvious rash, lesion, or ulcer.   Data Review     CBC w Diff:  Lab Results  Component Value Date   WBC 6.2 12/17/2017   HGB 7.7 (L) 12/17/2017   HGB 14.3 05/07/2013   HCT 23.8 (L) 12/17/2017   HCT 41.3 05/07/2013   PLT 198 12/17/2017   PLT 189 05/07/2013   LYMPHOPCT 13 12/16/2017   LYMPHOPCT 17.7 05/07/2013   MONOPCT 5 12/16/2017   MONOPCT 6.0 05/07/2013   EOSPCT 3 12/16/2017   EOSPCT 5.8 05/07/2013   BASOPCT 0 12/16/2017  BASOPCT 0.6 05/07/2013   CMP:  Lab Results  Component Value Date   NA 142 12/17/2017   NA 138 05/08/2013   K 3.6 12/17/2017   K 4.4 05/08/2013   CL 104 12/17/2017   CL 102 05/08/2013   CO2 29 12/17/2017   CO2 29 05/08/2013   BUN 22 (H) 12/17/2017   BUN 13 05/08/2013   CREATININE 1.35 (H) 12/17/2017   CREATININE 1.07 05/08/2013   PROT 6.7 12/16/2017   PROT 7.0 05/05/2013   ALBUMIN 3.6 12/16/2017   ALBUMIN 3.1 (L) 05/05/2013   BILITOT 0.5 12/16/2017    BILITOT 0.3 05/05/2013   ALKPHOS 62 12/16/2017   ALKPHOS 119 05/05/2013   AST 28 12/16/2017   AST 18 05/05/2013   ALT 11 (L) 12/16/2017   ALT 23 05/05/2013  .  Micro Results No results found for this or any previous visit (from the past 240 hour(s)).      Code Status Orders  (From admission, onward)        Start     Ordered   12/16/17 1842  Full code  Continuous     12/16/17 1842    Code Status History    Date Active Date Inactive Code Status Order ID Comments User Context   11/04/2017 12:21 11/08/2017 19:05 DNR [id number]  Morton Stall, NP Inpatient   11/02/2017 17:31 11/04/2017 12:21 Full Code [id number]  Houston Siren, MD Inpatient   12/22/2016 18:25 01/03/2017 20:11 Full Code [id number]  Tora Kindred, DO ED          Follow-up Information    Lauro Regulus, MD. Go on 12/19/2017.   Specialty:  Internal Medicine Why:        Go at 2:30pm.You will see a PA.Doctor  check cbc. Contact information: [address] [facility] [place] [address] [phone]           Discharge Medications   Allergies as of 12/17/2017   No Known Allergies     Medication List    STOP taking these medications   apixaban 5 MG Tabs tablet Commonly known as:  ELIQUIS   medroxyPROGESTERone 5 MG tablet Commonly known as:  PROVERA     TAKE these medications   fenofibrate 160 MG tablet Take 160 mg by mouth daily.   KLOR-CON 10 10 MEQ tablet Generic drug:  potassium chloride Take 20 mEq by mouth daily.   latanoprost 0.005 % ophthalmic solution Commonly known as:  XALATAN USE 1 DROP IN EACH EYE AT BEDTIME   losartan 25 MG tablet Commonly known as:  COZAAR Take 2 tablets (50 mg total) by mouth daily.   megestrol 40 MG tablet Commonly known as:  MEGACE Take 80 mg by mouth 2 (two) times daily.   metFORMIN 500 MG tablet Commonly known as:  GLUCOPHAGE Take 500 mg by mouth 2 (two) times daily with a meal.   metoprolol succinate 50 MG 24 hr  tablet Commonly known as:  TOPROL-XL Take 50 mg by mouth daily.   mirtazapine 30 MG tablet Commonly known as:  REMERON Take 30 mg by mouth at bedtime.   multivitamin-lutein Caps capsule Take 1 capsule by mouth daily.   nutrition supplement (JUVEN) Pack Take 2 packets by mouth 2 (two) times daily between meals.   risperiDONE 1 MG tablet Commonly known as:  RISPERDAL Take 1 tablet (1 mg total) by mouth at bedtime. What changed:  how much to take   torsemide 20 MG tablet Commonly  known as:  DEMADEX Take 20 mg by mouth daily.          Total Time in preparing paper work, data evaluation and todays exam - 35 minutes  Auburn BilberryShreyang Lalaine Overstreet M.D on 12/17/2017 at 2:57 PM [facility]   Office  [phone]

## 2017-12-17 NOTE — Progress Notes (Signed)
Jasmine Osborn to be D/C'd home per MD order.  Discussed prescriptions and follow up appointments with the patient. Prescriptions given to patient, medication list explained in detail. Pt verbalized understanding.  Allergies as of 12/17/2017   No Known Allergies     Medication List    STOP taking these medications   apixaban 5 MG Tabs tablet Commonly known as:  ELIQUIS   medroxyPROGESTERone 5 MG tablet Commonly known as:  PROVERA     TAKE these medications   fenofibrate 160 MG tablet Take 160 mg by mouth daily.   KLOR-CON 10 10 MEQ tablet Generic drug:  potassium chloride Take 20 mEq by mouth daily.   latanoprost 0.005 % ophthalmic solution Commonly known as:  XALATAN USE 1 DROP IN EACH EYE AT BEDTIME   losartan 25 MG tablet Commonly known as:  COZAAR Take 2 tablets (50 mg total) by mouth daily.   megestrol 40 MG tablet Commonly known as:  MEGACE Take 80 mg by mouth 2 (two) times daily.   metFORMIN 500 MG tablet Commonly known as:  GLUCOPHAGE Take 500 mg by mouth 2 (two) times daily with a meal.   metoprolol succinate 50 MG 24 hr tablet Commonly known as:  TOPROL-XL Take 50 mg by mouth daily.   mirtazapine 30 MG tablet Commonly known as:  REMERON Take 30 mg by mouth at bedtime.   multivitamin-lutein Caps capsule Take 1 capsule by mouth daily.   nutrition supplement (JUVEN) Pack Take 2 packets by mouth 2 (two) times daily between meals.   risperiDONE 1 MG tablet Commonly known as:  RISPERDAL Take 1 tablet (1 mg total) by mouth at bedtime. What changed:  how much to take   torsemide 20 MG tablet Commonly known as:  DEMADEX Take 20 mg by mouth daily.       Vitals:   12/17/17 1238 12/17/17 1313  BP: 107/65 (!) 104/59  Pulse: 97 74  Resp: 17 17  Temp: 97.8 F (36.6 C) 98 F (36.7 C)  SpO2: 94% 94%    Skin clean, dry and intact without evidence of skin break down, no evidence of skin tears noted. IV catheter discontinued intact. Site without  signs and symptoms of complications. Dressing and pressure applied. Pt denies pain at this time. No complaints noted.  An After Visit Summary was printed and given to the patient. Patient escorted via WC, and D/C home via private auto.  [name] A

## 2017-12-17 NOTE — Evaluation (Signed)
Physical Therapy Evaluation Patient Details Name: Jasmine Osborn MRN: [id number] DOB: 08/29/1951 Today's Date: 12/17/2017   History of Present Illness  Pt admitted for a vaginal bleed.  PMH inclused PVD, obesity hypoventilation symptoms, hernia of abdominal wall, elevated lipids, DM, depression, atrial fibrillation and chronic respiratory failure with hypoxia and hypercapnia.  Clinical Impression  Pt is a 67 year old female who lives in a one story home with her husband.  She was sitting in her recliner receiving a blood transfusion upon PT arrival.  Pt presented with weakness of UE and WNL strength of BLE, reporting no sensation loss in feet.  Pt was able to perform STS with RW without physical assistance from PT and no VC's for proper use of RW.  Pt ambulated 80 ft with RW and assistance from PT for management of IV pole and O2 tank.  PT provided supervision throughout for safety  Pt was on 2 L of O2 throughout evaluation and vitals remained WNL.  Pt will continue to benefit from skilled PT with focus on strength, tolerance to activity and balance.    Follow Up Recommendations Home health PT    Equipment Recommendations       Recommendations for Other Services       Precautions / Restrictions Precautions Precautions: Fall Restrictions Weight Bearing Restrictions: No      Mobility  Bed Mobility               General bed mobility comments: Pt in chair receiving blood transfusion upon PT arrival.  Transfers Overall transfer level: Modified independent Equipment used: Rolling walker (2 wheeled)             General transfer comment: Pt able to stand from chair without PT assist with RW, uses bilateral UE for support.  Ambulation/Gait Ambulation/Gait assistance: Supervision Ambulation Distance (Feet): 80 Feet Assistive device: Rolling walker (2 wheeled)     Gait velocity interpretation: Below normal speed for age/gender General Gait Details: Pt able to ambulate  with RW and slow gait with low foot clearance, flexed posture.  PT provided assistance with management of O2 tank and IV pole but pt was able to ambulate without VC's for sequencing or management of RW.  Stairs            Wheelchair Mobility    Modified Rankin (Stroke Patients Only)       Balance Overall balance assessment: Modified Independent                                           Pertinent Vitals/Pain Pain Assessment: No/denies pain    Home Living Family/patient expects to be discharged to:: Private residence Living Arrangements: Spouse/significant other Available Help at Discharge: Available 24 hours/day;Family Type of Home: House Home Access: Stairs to enter Entrance Stairs-Rails: Can reach both Entrance Stairs-Number of Steps: 5 Home Layout: One level Home Equipment: Walker - 2 wheels      Prior Function Level of Independence: Needs assistance   Gait / Transfers Assistance Needed: Uses RW to steady herself in the morning but not throughout the day.  ADL's / Homemaking Assistance Needed: pt is able to do most bathing and dressing on her own but husband helps sometimes.        Hand Dominance        Extremity/Trunk Assessment   Upper Extremity Assessment Upper Extremity Assessment: Generalized weakness  Lower Extremity Assessment Lower Extremity Assessment: Overall WFL for tasks assessed    Cervical / Trunk Assessment Cervical / Trunk Assessment: Normal  Communication      Cognition Arousal/Alertness: Awake/alert Behavior During Therapy: WFL for tasks assessed/performed Overall Cognitive Status: Within Functional Limits for tasks assessed                                        General Comments      Exercises     Assessment/Plan    PT Assessment Patient needs continued PT services  PT Problem List Decreased strength;Decreased mobility;Decreased activity tolerance;Decreased balance       PT  Treatment Interventions DME instruction;Therapeutic activities;Gait training;Therapeutic exercise;Stair training;Balance training;Functional mobility training;Patient/family education    PT Goals (Current goals can be found in the Care Plan section)  Acute Rehab PT Goals Patient Stated Goal: To return home and maintain current level of function. PT Goal Formulation: With patient Time For Goal Achievement: 12/31/17 Potential to Achieve Goals: Good    Frequency Min 2X/week   Barriers to discharge        Co-evaluation               AM-PAC PT "6 Clicks" Daily Activity  Outcome Measure Difficulty turning over in bed (including adjusting bedclothes, sheets and blankets)?: A Little Difficulty moving from lying on back to sitting on the side of the bed? : A Little Difficulty sitting down on and standing up from a chair with arms (e.g., wheelchair, bedside commode, etc,.)?: A Little Help needed moving to and from a bed to chair (including a wheelchair)?: A Little Help needed walking in hospital room?: A Little Help needed climbing 3-5 steps with a railing? : A Little 6 Click Score: 18    End of Session Equipment Utilized During Treatment: Gait belt;Oxygen Activity Tolerance: Patient limited by fatigue Patient left: in chair;with call bell/phone within reach;with chair alarm set   PT Visit Diagnosis: Unsteadiness on feet (R26.81);Muscle weakness (generalized) (M62.81)    Time: 1310-1340 PT Time Calculation (min) (ACUTE ONLY): 30 min   Charges:   PT Evaluation $PT Eval Low Complexity: 1 Low     PT G Codes:   PT G-Codes **NOT FOR INPATIENT CLASS** Functional Assessment Tool Used: AM-PAC 6 Clicks Basic Mobility    Glenetta Hew, PT, DPT   Glenetta Hew 12/17/2017, 2:00 PM

## 2017-12-17 NOTE — Discharge Instructions (Signed)
[facility] - [place] at [facility]  DIET:  Diabetic and cardiac diet  DISCHARGE CONDITION:  stable  ACTIVITY:  As tolerated  OXYGEN:  Home Oxygen: yes Oxygen Delivery: no  DISCHARGE LOCATION:  home   ADDITIONAL DISCHARGE INSTRUCTION:   If you experience worsening of your admission symptoms, develop shortness of breath, life threatening emergency, suicidal or homicidal thoughts you must seek medical attention immediately by calling 911 or calling your MD immediately  if symptoms less severe.  You Must read complete instructions/literature along with all the possible adverse reactions/side effects for all the Medicines you take and that have been prescribed to you. Take any new Medicines after you have completely understood and accpet all the possible adverse reactions/side effects.   Please note  You were cared for by a hospitalist during your hospital stay. If you have any questions about your discharge medications or the care you received while you were in the hospital after you are discharged, you can call the unit and asked to speak with the hospitalist on call if the hospitalist that took care of you is not available. Once you are discharged, your primary care physician will handle any further medical issues. Please note that NO REFILLS for any discharge medications will be authorized once you are discharged, as it is imperative that you return to your primary care physician (or establish a relationship with a primary care physician if you do not have one) for your aftercare needs so that they can reassess your need for medications and monitor your lab values.

## 2017-12-18 LAB — TYPE AND SCREEN
ABO/RH(D): O POS
Antibody Screen: NEGATIVE
UNIT DIVISION: 0
UNIT DIVISION: 0
Unit division: 0

## 2017-12-18 LAB — BPAM RBC
BLOOD PRODUCT EXPIRATION DATE: 201904062359
Blood Product Expiration Date: 201904062359
Blood Product Expiration Date: 201904062359
ISSUE DATE / TIME: 201903122134
ISSUE DATE / TIME: 201903131249
ISSUE DATE / TIME: 201903121848
Unit Type and Rh: 5100
Unit Type and Rh: 5100
Unit Type and Rh: 5100

## 2018-04-05 IMAGING — DX DG CHEST 1V PORT
2 series · 2 of 2 positions shown · non-contrast
Comparison: Chest x-ray 09/16/2010.

CLINICAL DATA: 66-year-old female with history of hypertension and
diabetes presenting with lethargy and weakness intermittently for
the past week. Low oxygen saturations.

EXAM:
PORTABLE CHEST 1 VIEW

[chest ap (1 of 2)]
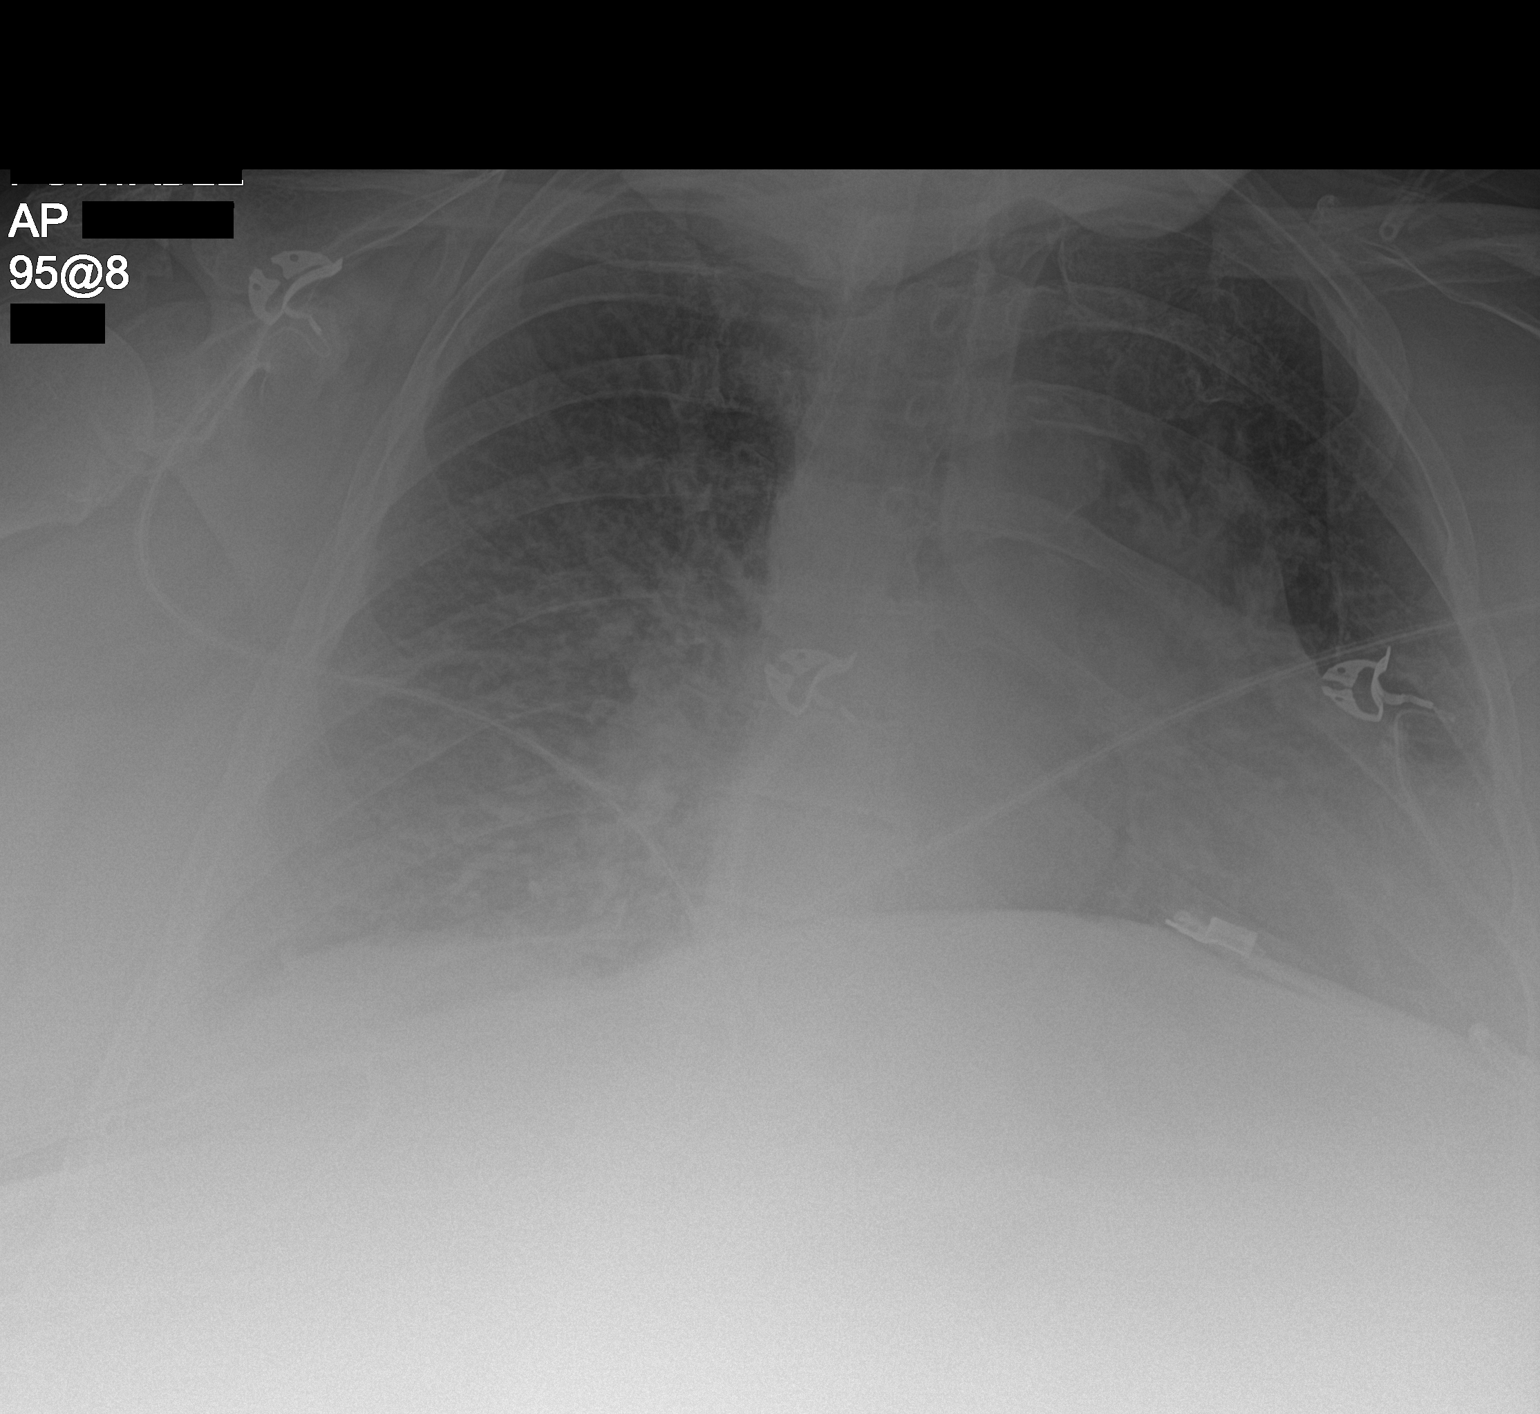

[chest ap (2 of 2)]
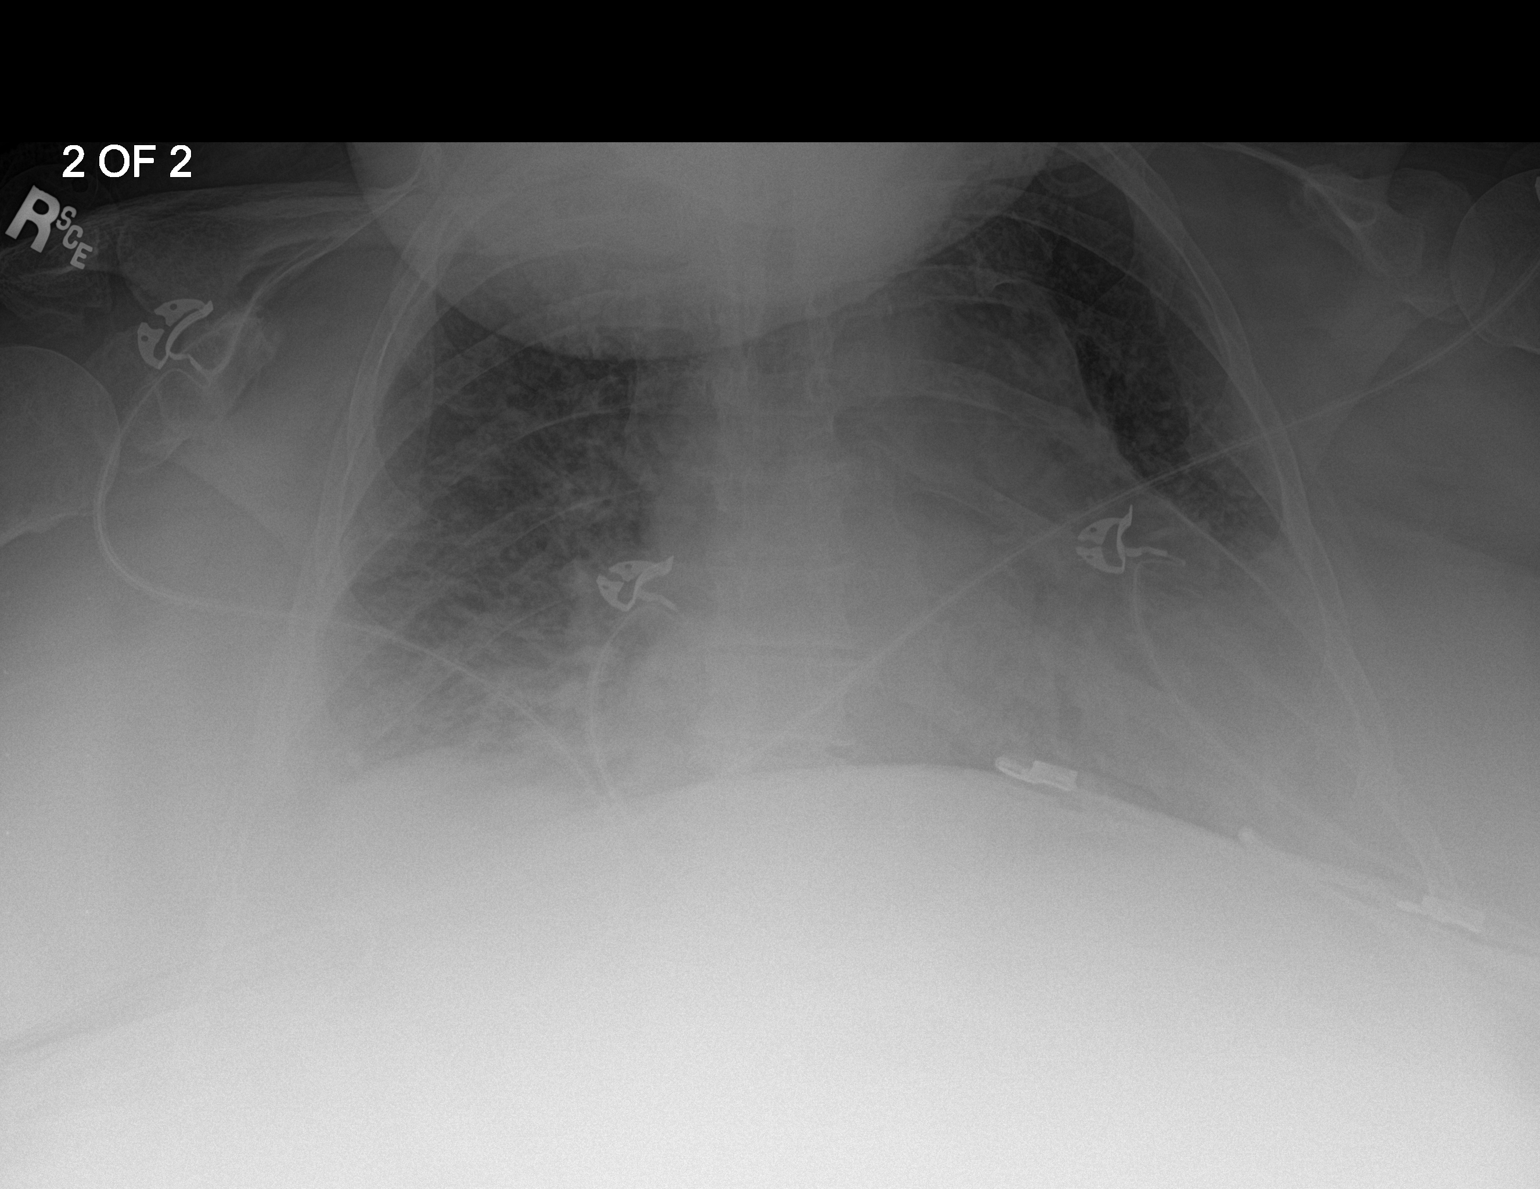

[2 of 2 positions shown; findings below may reference images not displayed]

FINDINGS: There is cephalization of the pulmonary vasculature, indistinctness
of the interstitial markings, and patchy airspace disease throughout
the lungs bilaterally suggestive of moderate pulmonary edema. No
pleural effusions. Mild cardiomegaly. The patient is rotated to the
left on today's exam, resulting in distortion of the mediastinal
contours and reduced diagnostic sensitivity and specificity for
mediastinal pathology. Atherosclerosis in the thoracic aorta.
IMPRESSION: 1. The appearance of the chest suggests congestive heart failure, as
above.
2. Aortic atherosclerosis.

## 2018-04-05 IMAGING — CT CT HEAD W/O CM
4 of 5 series · 16 of 47 positions shown, 18 images · non-contrast
Comparison: None.

CLINICAL DATA: Per husband pt has been lethargic and weak on and
off for the past week. Pt intubated

EXAM:
CT HEAD WITHOUT CONTRAST
TECHNIQUE: Contiguous axial images were obtained from the base of the skull
through the vertex without intravenous contrast.

[Series 3: head wo · axial · 0.40mm/px · z∈[-100,+20]mm · 8 of 32 slices shown, 10 images]
[im 4/32  brain]
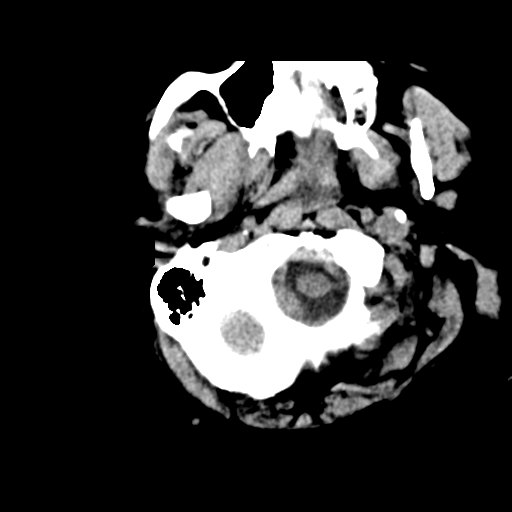
[im 4/32  bone]
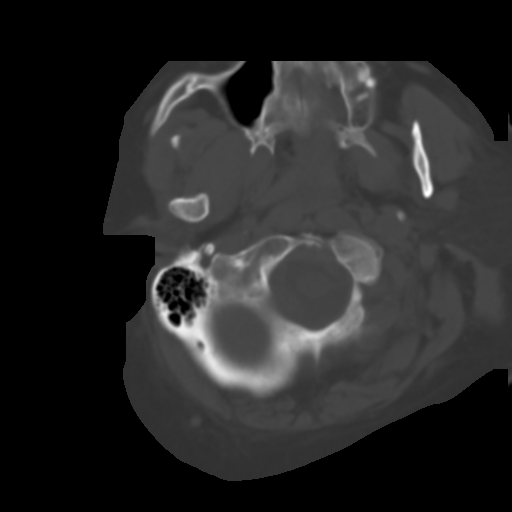
[im 7/32  brain]
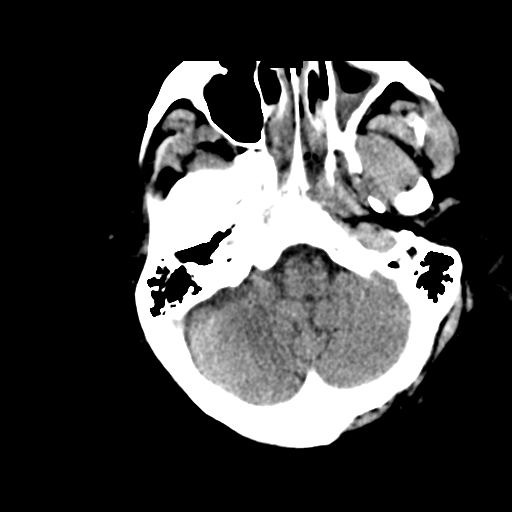
[im 11/32  brain]
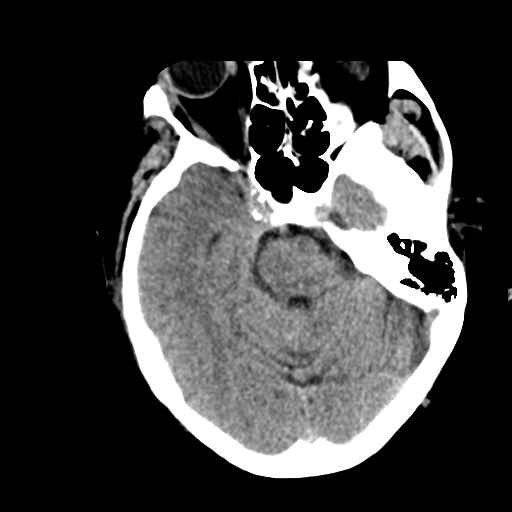
[im 14/32  brain]
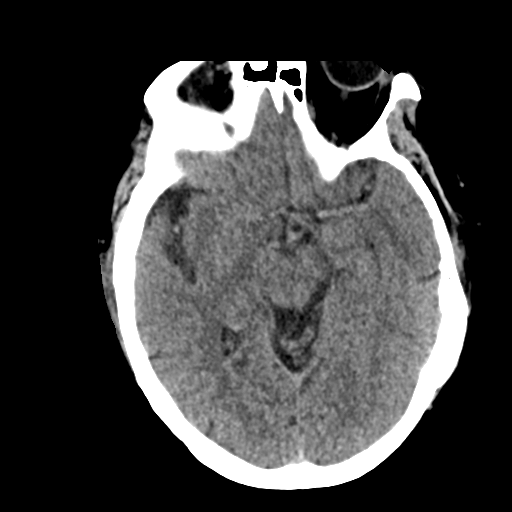
[im 18/32  brain]
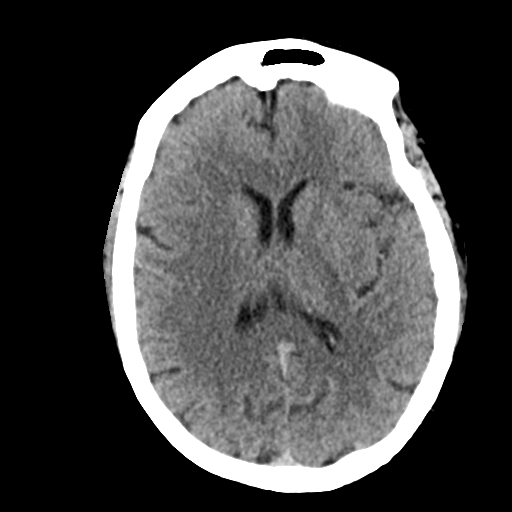
[im 18/32  bone]
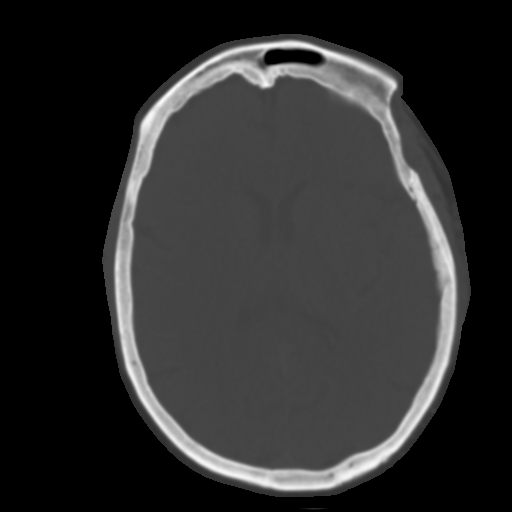
[im 21/32  brain]
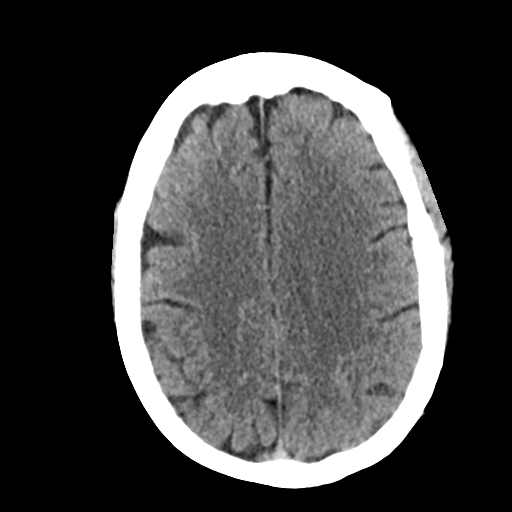
[im 25/32  brain]
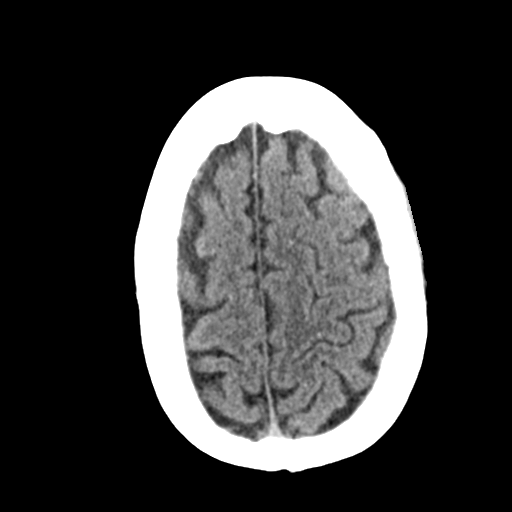
[im 28/32  brain]
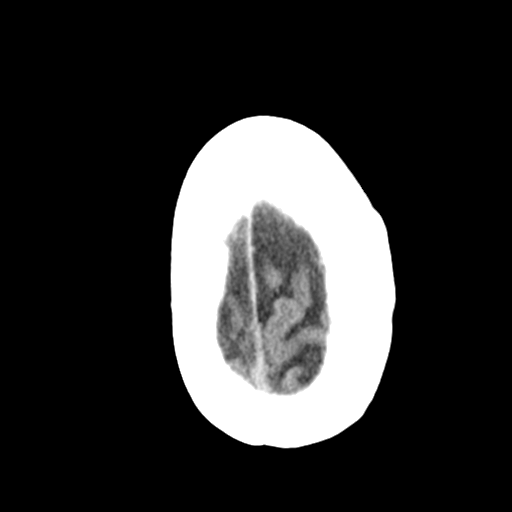

[Series 4: coronal soft tissue · coronal · 0.31mm/px · 3 of 64 slices shown]
[im 24/64  brain]
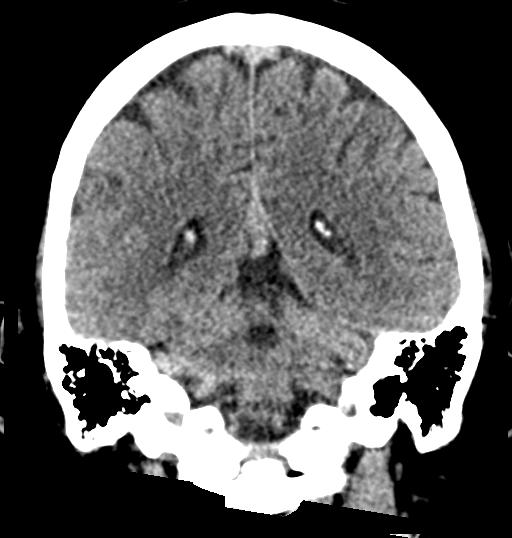
[im 29/64  brain]
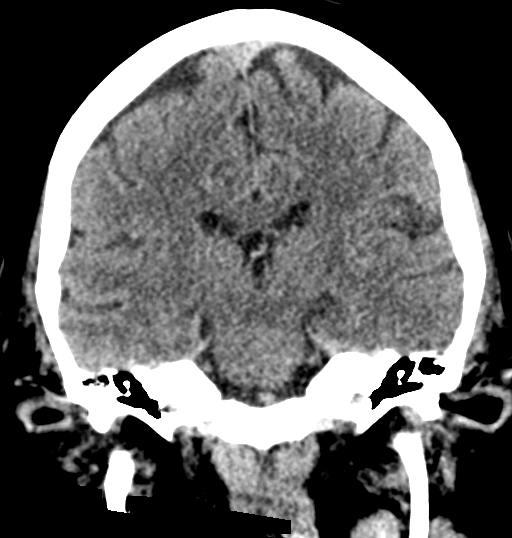
[im 35/64  brain]
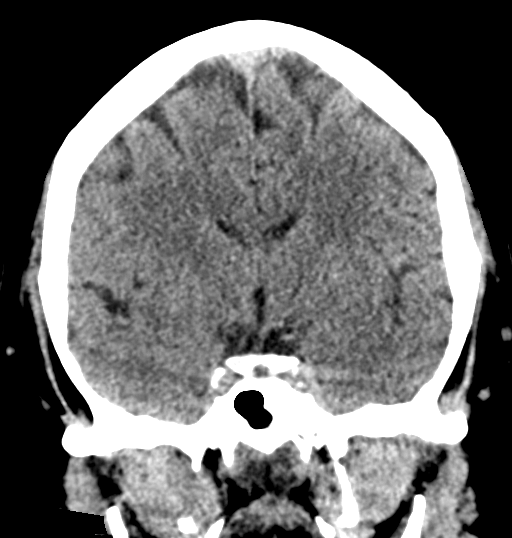

[Series 5: sagittal soft tissue · sagittal · 0.32mm/px · 3 of 53 slices shown]
[im 20/53  brain]
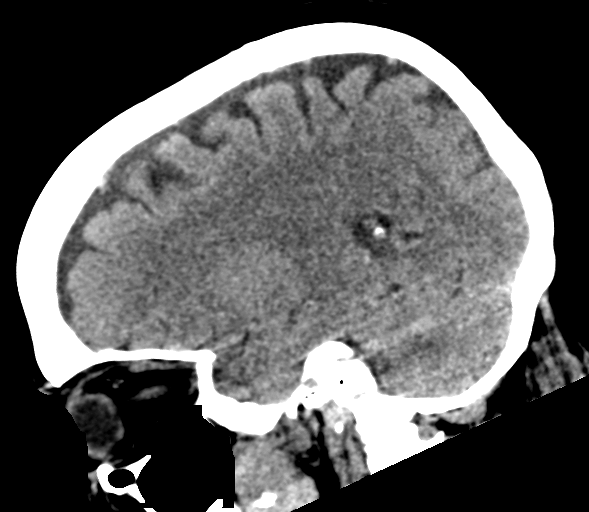
[im 27/53  brain]
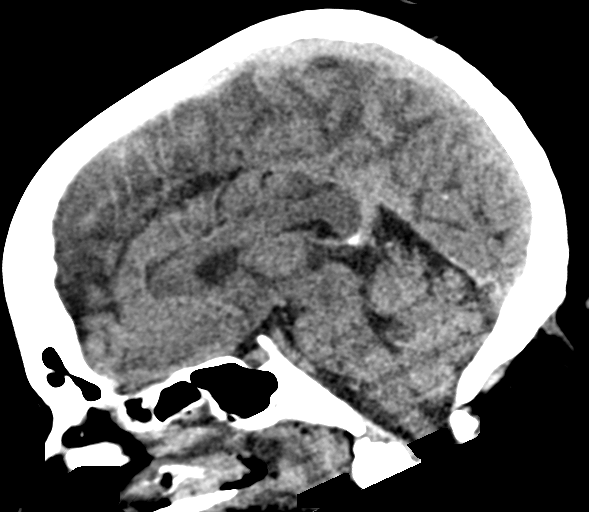
[im 34/53  brain]
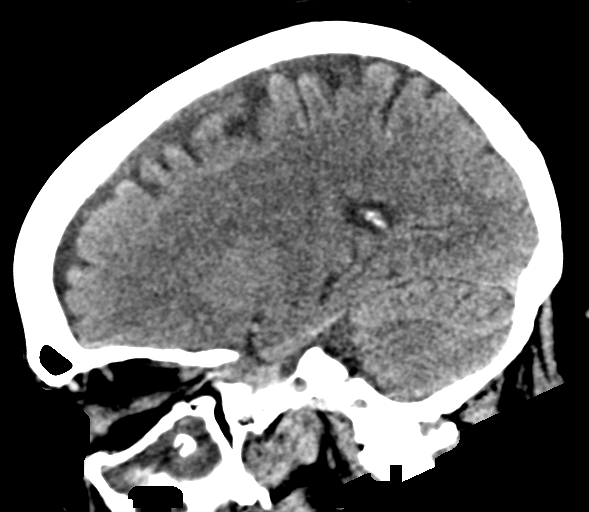

[Series 6: ax head wo- · axial · 0.31mm/px · z∈[-70,-53]mm · 2 of 34 slices shown]
[im 4/34  brain]
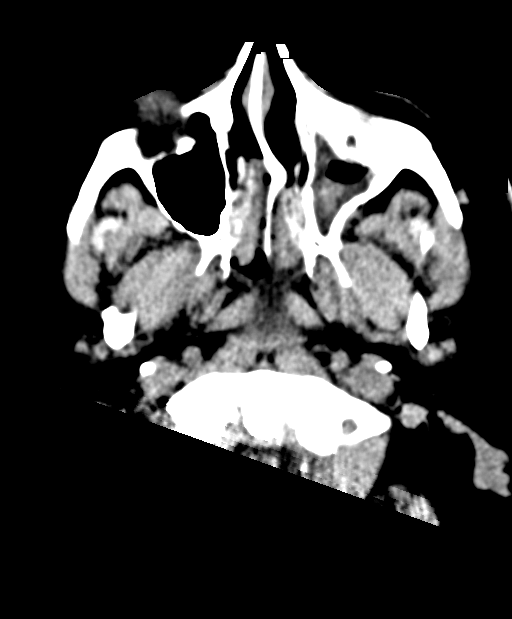
[im 8/34  brain]
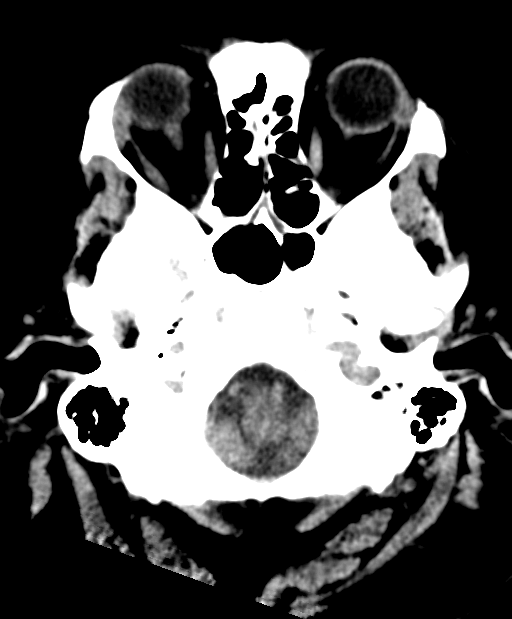

[16 of 47 positions shown; findings below may reference images not displayed]

FINDINGS: Brain: No acute intracranial hemorrhage. No focal mass lesion. No CT
evidence of acute infarction. No midline shift or mass effect. No
hydrocephalus. Basilar cisterns are patent.

Vascular: No hyperdense vessel or unexpected calcification.

Skull: Normal. Negative for fracture or focal lesion.

Sinuses/Orbits: Coastal thickening in the maxillary sinuses.
Opacification ethmoid air cells.

Other: Intubated patient
IMPRESSION: 1. No acute intracranial findings.
2. Maxillary and ethmoid sinus inflammation.

## 2020-01-03 ENCOUNTER — Other Ambulatory Visit: Payer: Self-pay

## 2020-01-03 ENCOUNTER — Inpatient Hospital Stay
Admission: EM | Admit: 2020-01-03 | Discharge: 2020-01-10 | DRG: 744 | Disposition: A | Discharge status: Skilled Nursing Facility | Payer: Medicare HMO | Attending: Internal Medicine | Admitting: Internal Medicine

## 2020-01-03 ENCOUNTER — Encounter: Payer: Self-pay | Admitting: Emergency Medicine

## 2020-01-03 DIAGNOSIS — I5033 Acute on chronic diastolic (congestive) heart failure: Secondary | ICD-10-CM | POA: Diagnosis not present

## 2020-01-03 DIAGNOSIS — J9621 Acute and chronic respiratory failure with hypoxia: Secondary | ICD-10-CM | POA: Diagnosis not present

## 2020-01-03 DIAGNOSIS — I471 Supraventricular tachycardia: Secondary | ICD-10-CM | POA: Diagnosis not present

## 2020-01-03 DIAGNOSIS — N939 Abnormal uterine and vaginal bleeding, unspecified: Secondary | ICD-10-CM | POA: Diagnosis present

## 2020-01-03 DIAGNOSIS — J9622 Acute and chronic respiratory failure with hypercapnia: Secondary | ICD-10-CM | POA: Diagnosis not present

## 2020-01-03 DIAGNOSIS — I480 Paroxysmal atrial fibrillation: Secondary | ICD-10-CM | POA: Diagnosis not present

## 2020-01-03 DIAGNOSIS — Z7901 Long term (current) use of anticoagulants: Secondary | ICD-10-CM

## 2020-01-03 DIAGNOSIS — D649 Anemia, unspecified: Secondary | ICD-10-CM | POA: Diagnosis present

## 2020-01-03 DIAGNOSIS — E860 Dehydration: Secondary | ICD-10-CM | POA: Diagnosis present

## 2020-01-03 DIAGNOSIS — L304 Erythema intertrigo: Secondary | ICD-10-CM | POA: Diagnosis present

## 2020-01-03 DIAGNOSIS — N950 Postmenopausal bleeding: Principal | ICD-10-CM | POA: Diagnosis present

## 2020-01-03 DIAGNOSIS — E1122 Type 2 diabetes mellitus with diabetic chronic kidney disease: Secondary | ICD-10-CM | POA: Diagnosis present

## 2020-01-03 DIAGNOSIS — D500 Iron deficiency anemia secondary to blood loss (chronic): Secondary | ICD-10-CM | POA: Diagnosis not present

## 2020-01-03 DIAGNOSIS — Z79899 Other long term (current) drug therapy: Secondary | ICD-10-CM

## 2020-01-03 DIAGNOSIS — I4819 Other persistent atrial fibrillation: Secondary | ICD-10-CM | POA: Diagnosis present

## 2020-01-03 DIAGNOSIS — Z7984 Long term (current) use of oral hypoglycemic drugs: Secondary | ICD-10-CM

## 2020-01-03 DIAGNOSIS — N179 Acute kidney failure, unspecified: Secondary | ICD-10-CM | POA: Diagnosis present

## 2020-01-03 DIAGNOSIS — D519 Vitamin B12 deficiency anemia, unspecified: Secondary | ICD-10-CM | POA: Diagnosis not present

## 2020-01-03 DIAGNOSIS — R000 Tachycardia, unspecified: Secondary | ICD-10-CM

## 2020-01-03 DIAGNOSIS — I130 Hypertensive heart and chronic kidney disease with heart failure and stage 1 through stage 4 chronic kidney disease, or unspecified chronic kidney disease: Secondary | ICD-10-CM | POA: Diagnosis present

## 2020-01-03 DIAGNOSIS — Z6841 Body Mass Index (BMI) 40.0 and over, adult: Secondary | ICD-10-CM

## 2020-01-03 DIAGNOSIS — E11621 Type 2 diabetes mellitus with foot ulcer: Secondary | ICD-10-CM | POA: Diagnosis present

## 2020-01-03 DIAGNOSIS — N920 Excessive and frequent menstruation with regular cycle: Secondary | ICD-10-CM

## 2020-01-03 DIAGNOSIS — L03116 Cellulitis of left lower limb: Secondary | ICD-10-CM | POA: Diagnosis present

## 2020-01-03 DIAGNOSIS — R0682 Tachypnea, not elsewhere classified: Secondary | ICD-10-CM

## 2020-01-03 DIAGNOSIS — E781 Pure hyperglyceridemia: Secondary | ICD-10-CM | POA: Diagnosis present

## 2020-01-03 DIAGNOSIS — E538 Deficiency of other specified B group vitamins: Secondary | ICD-10-CM | POA: Diagnosis present

## 2020-01-03 DIAGNOSIS — Z20822 Contact with and (suspected) exposure to covid-19: Secondary | ICD-10-CM | POA: Diagnosis present

## 2020-01-03 DIAGNOSIS — D509 Iron deficiency anemia, unspecified: Secondary | ICD-10-CM | POA: Diagnosis present

## 2020-01-03 DIAGNOSIS — E1151 Type 2 diabetes mellitus with diabetic peripheral angiopathy without gangrene: Secondary | ICD-10-CM | POA: Diagnosis present

## 2020-01-03 DIAGNOSIS — I959 Hypotension, unspecified: Secondary | ICD-10-CM | POA: Diagnosis not present

## 2020-01-03 DIAGNOSIS — F329 Major depressive disorder, single episode, unspecified: Secondary | ICD-10-CM | POA: Diagnosis present

## 2020-01-03 DIAGNOSIS — J9601 Acute respiratory failure with hypoxia: Secondary | ICD-10-CM

## 2020-01-03 DIAGNOSIS — L97509 Non-pressure chronic ulcer of other part of unspecified foot with unspecified severity: Secondary | ICD-10-CM

## 2020-01-03 DIAGNOSIS — Z87891 Personal history of nicotine dependence: Secondary | ICD-10-CM | POA: Diagnosis not present

## 2020-01-03 DIAGNOSIS — L97521 Non-pressure chronic ulcer of other part of left foot limited to breakdown of skin: Secondary | ICD-10-CM

## 2020-01-03 DIAGNOSIS — E662 Morbid (severe) obesity with alveolar hypoventilation: Secondary | ICD-10-CM | POA: Diagnosis present

## 2020-01-03 DIAGNOSIS — I472 Ventricular tachycardia: Secondary | ICD-10-CM | POA: Diagnosis not present

## 2020-01-03 DIAGNOSIS — N189 Chronic kidney disease, unspecified: Secondary | ICD-10-CM | POA: Diagnosis present

## 2020-01-03 DIAGNOSIS — J449 Chronic obstructive pulmonary disease, unspecified: Secondary | ICD-10-CM | POA: Diagnosis present

## 2020-01-03 LAB — CBC
HCT: 17.3 % — ABNORMAL LOW (ref 36.0–46.0)
Hemoglobin: 4.2 g/dL — CL (ref 12.0–15.0)
MCH: 28.4 pg (ref 26.0–34.0)
MCHC: 24.3 g/dL — ABNORMAL LOW (ref 30.0–36.0)
MCV: 116.9 fL — ABNORMAL HIGH (ref 80.0–100.0)
Platelets: 173 10*3/uL (ref 150–400)
RBC: 1.48 MIL/uL — ABNORMAL LOW (ref 3.87–5.11)
RDW: 19.5 % — ABNORMAL HIGH (ref 11.5–15.5)
WBC: 5 10*3/uL (ref 4.0–10.5)
nRBC: 0 % (ref 0.0–0.2)

## 2020-01-03 LAB — RESPIRATORY PANEL BY RT PCR (FLU A&B, COVID)
Influenza A by PCR: NEGATIVE
Influenza B by PCR: NEGATIVE
SARS Coronavirus 2 by RT PCR: NEGATIVE

## 2020-01-03 LAB — BASIC METABOLIC PANEL
Anion gap: 8 (ref 5–15)
BUN: 30 mg/dL — ABNORMAL HIGH (ref 8–23)
CO2: 23 mmol/L (ref 22–32)
Calcium: 8.7 mg/dL — ABNORMAL LOW (ref 8.9–10.3)
Chloride: 109 mmol/L (ref 98–111)
Creatinine, Ser: 2.09 mg/dL — ABNORMAL HIGH (ref 0.44–1.00)
GFR calc Af Amer: 27 mL/min — ABNORMAL LOW (ref >60)
GFR calc non Af Amer: 24 mL/min — ABNORMAL LOW (ref >60)
Glucose, Bld: 131 mg/dL — ABNORMAL HIGH (ref 70–99)
Potassium: 5.1 mmol/L (ref 3.5–5.1)
Sodium: 140 mmol/L (ref 135–145)

## 2020-01-03 LAB — PREPARE RBC (CROSSMATCH)

## 2020-01-03 LAB — PROTIME-INR
INR: 1.8 — ABNORMAL HIGH (ref 0.8–1.2)
Prothrombin Time: 20.9 seconds — ABNORMAL HIGH (ref 11.4–15.2)

## 2020-01-03 MED ORDER — ONDANSETRON HCL 4 MG/2ML IJ SOLN: 4.0000 mg | Freq: Four times a day (QID) | INTRAMUSCULAR | Status: DC | PRN

## 2020-01-03 MED ORDER — SODIUM CHLORIDE 0.9 % IV SOLN
10.0000 mL/h | Freq: Once | INTRAVENOUS | Status: AC
  Administered 2020-01-03: 10 mL/h via INTRAVENOUS

## 2020-01-03 MED ORDER — CHLORHEXIDINE GLUCONATE CLOTH 2 % EX PADS
6.0000 | MEDICATED_PAD | Freq: Every day | CUTANEOUS | Status: DC
  Administered 2020-01-03 – 2020-01-10 (×7): 6 via TOPICAL

## 2020-01-03 MED ORDER — SODIUM CHLORIDE 0.9 % IV SOLN: INTRAVENOUS | Status: DC

## 2020-01-03 MED ORDER — ONDANSETRON HCL 4 MG PO TABS: 4.0000 mg | ORAL_TABLET | Freq: Four times a day (QID) | ORAL | Status: DC | PRN

## 2020-01-03 MED ORDER — PROTHROMBIN COMPLEX CONC HUMAN 1000 UNITS IV KIT
5000.0000 [IU] | PACK | Status: AC
  Administered 2020-01-03: 20:00:00 5000 [IU] via INTRAVENOUS
  Filled 2020-01-03: qty 5000

## 2020-01-03 NOTE — Consult Note (Signed)
Consult History and Physical   SERVICE: Gynecology [facility]  Patient Name: Jasmine Osborn Patient MRN:   [id number]  CC:4 weeks of vaginal bleeding. Now with SOB  + clot passage   HPI: Jasmine Osborn is a 69 y.o.  Pt with recurrent uterine bleeding. She was seen PMB 2015 ,EMBX done that showed simple endometrial hyperplasia. An IUD Mirena was placed. She was brought back 2016 to repeat an [facility] but this was unsuccessful. Serial TVUs show an increasing endometrial stripe despite Megace tx . She underwent a failed attempt to sample the lining of the utx in 2017 and she was referred to Gyn/onc. Office EMBX ( Dr Theora Gianotti) was benign and she was keep on Megace . In 12/2017 she was admitted again with heavy menorrhagia and a Hgb of 5.8 and after transfussion 7.7. At that admission MRI showed utx 14x13 cm and heterogenous stripe at 28 mm. Ctscan showed a possible left sidewall adenopathy. She has not had additional GYN care since that admission . Hgb today 4.2 .Pt was on Plavix for A fib which was stopped tonight in the ED Review of Systems: positives in bold GEN:   fevers, chills, weight changes, appetite changes, fatigue, night sweats HEENT:  HA, vision changes, hearing loss, congestion, rhinorrhea, sinus pressure, dysphagia CV:   CP, palpitations PULM:  SOB, cough GI:  abd pain, N/V/D/C GU:  dysuria, urgency, frequency, + heavy vaginal bleeding  MSK:  arthralgias, myalgias, back pain, swelling SKIN:  rashes, color changes, pallor NEURO:  numbness, weakness, tingling, seizures, dizziness, tremors PSYCH:  depression, anxiety, behavioral problems, confusion  HEME/LYMPH:  easy bruising or bleeding ENDO:  heat/cold intolerance  Past Obstetrical History: OB History   No obstetric history on file.    G2 P2  Past Gynecologic History:  PMB  Past Medical History: Past Medical History:  Diagnosis Date  . Atrial fibrillation ([place])   . Chronic respiratory failure with hypoxia and  hypercapnia (HCC)   . Depression   . Diabetes mellitus without complication ([place])   . Elevated lipids   . Hernia of abdominal wall   . Hypertension   . Increased endometrial stripe thickness 06/04/2017  . Increased endometrial stripe thickness 06/04/2017  . Obesity hypoventilation syndrome ([place])   . Psychosis ([place])   . PVD (peripheral vascular disease) (HCC)    heel ulcer Lt     Past Surgical History:   Past Surgical History:  Procedure Laterality Date  . COLON SURGERY    . DILATION AND CURETTAGE OF UTERUS    . HERNIA REPAIR    . TIBIA FRACTURE SURGERY Left     Family History:  family history includes Dementia in her mother; Valvular heart disease in her father.  Social History:  Social History   Socioeconomic History  . Marital status: Married    Spouse name: Not on file  . Number of children: Not on file  . Years of education: Not on file  . Highest education level: Not on file  Occupational History  . Not on file  Tobacco Use  . Smoking status: Former Research scientist (life sciences)  . Smokeless tobacco: Never Used  Substance and Sexual Activity  . Alcohol use: No  . Drug use: No  . Sexual activity: Not on file  Other Topics Concern  . Not on file  Social History Narrative  . Not on file   Social Determinants of Health   Financial Resource Strain:   . Difficulty of Paying Living Expenses:   Food Insecurity:   .  Worried About Running Out of Food in the Last Year:   . Ran Out of Food in the Last Year:   Transportation Needs:   . Lack of Transportation (Medical):   . Lack of Transportation (Non-Medical):   Physical Activity:   . Days of Exercise per Week:   . Minutes of Exercise per Session:   Stress:   . Feeling of Stress :   Social Connections:   . Frequency of Communication with Friends and Family:   . Frequency of Social Gatherings with Friends and Family:   . Attends Religious Services:   . Active Member of Clubs or Organizations:   . Attends Club or Organization  Meetings:   . Marital Status:   Intimate Partner Violence:   . Fear of Current or Ex-Partner:   . Emotionally Abused:   . Physically Abused:   . Sexually Abused:     Home Medications:  Medications reconciled in EPIC  No current facility-administered medications on file prior to encounter.   Current Outpatient Medications on File Prior to Encounter  Medication Sig Dispense Refill  . fenofibrate 160 MG tablet Take 160 mg by mouth daily.    . KLOR-CON 10 10 MEQ tablet Take 20 mEq by mouth daily.   0  . latanoprost (XALATAN) 0.005 % ophthalmic solution USE 1 DROP IN EACH EYE AT BEDTIME    . losartan (COZAAR) 25 MG tablet Take 2 tablets (50 mg total) by mouth daily. 30 tablet 0  . megestrol (MEGACE) 40 MG tablet Take 80 mg by mouth 2 (two) times daily.  11  . metFORMIN (GLUCOPHAGE) 500 MG tablet Take 500 mg by mouth 2 (two) times daily with a meal.    . metoprolol succinate (TOPROL-XL) 50 MG 24 hr tablet Take 50 mg by mouth daily.  4  . mirtazapine (REMERON) 30 MG tablet Take 30 mg by mouth at bedtime.    . multivitamin-lutein (OCUVITE-LUTEIN) CAPS capsule Take 1 capsule by mouth daily. 30 capsule 0  . nutrition supplement, JUVEN, (JUVEN) PACK Take 2 packets by mouth 2 (two) times daily between meals. 120 packet 0  . risperiDONE (RISPERDAL) 1 MG tablet Take 1 tablet (1 mg total) by mouth at bedtime. (Patient taking differently: Take 1.5 mg by mouth at bedtime. ) 30 tablet 0  . torsemide (DEMADEX) 20 MG tablet Take 20 mg by mouth daily.      Allergies:  No Known Allergies  Physical Exam:  Temp:  [98 F (36.7 C)-98.7 F (37.1 C)] 98.4 F (36.9 C) (03/29 1833) Pulse Rate:  [92-105] 95 (03/29 1900) Resp:  [17-26] 22 (03/29 1900) BP: (117-131)/(43-58) 122/49 (03/29 1900) SpO2:  [98 %-100 %] 98 % (03/29 1900) Weight:  [113.4 kg] 113.4 kg (03/29 1432)   General Appearance:  Ill appearing , pale female  HEENT:  Normocephalic atraumatic, extraocular movements intact, moist mucous  membranes Cardiovascular:  Normal S1/S2, regular rate and rhythm, no murmurs Pulmonary:  clear to auscultation, no wheezes, rales or rhonchi, symmetric air entry, good air exchange Abdomen:  Bowel sounds present, soft, nontender, nondistended, no abnormal masses, no epigastric pain, large protruding left lower Abd wall  Psychiatric:  Normal mood and affect, appropriate, no AH/VH Pelvic:   Limited bimanual exam  Mons edematous Single digit exam with pt in a recumbant position Cx palpated , no masses appreciated . + non- clotted blood on gloves    Labs/Studies:   CBC and Coags:  Lab Results  Component Value Date   WBC 5.0   01/03/2020   NEUTOPHILPCT 79 12/16/2017   EOSPCT 3 12/16/2017   BASOPCT 0 12/16/2017   LYMPHOPCT 13 12/16/2017   HGB 4.2 (LL) 01/03/2020   HCT 17.3 (L) 01/03/2020   MCV 116.9 (H) 01/03/2020   PLT 173 01/03/2020   INR 1.8 (H) 01/03/2020   CMP:  Lab Results  Component Value Date   NA 140 01/03/2020   K 5.1 01/03/2020   CL 109 01/03/2020   CO2 23 01/03/2020   BUN 30 (H) 01/03/2020   CREATININE 2.09 (H) 01/03/2020   CREATININE 1.35 (H) 12/17/2017   CREATININE 1.44 (H) 12/16/2017   PROT 6.7 12/16/2017   BILITOT 0.5 12/16/2017   BILIDIR 0.4 12/22/2016   ALT 11 (L) 12/16/2017   AST 28 12/16/2017   ALKPHOS 62 12/16/2017    Other Imaging: No results found.   Assessment / Plan:   Jasmine Osborn is a 69 y.o. No obstetric history on file. who presents with PMB and severe anemia  1. At risk for endometrial cancer given her prior history . She needs a TVUS and ctscan pelvic to better assess Recommend Hospitalist service admit pt given her other co morbitities Blood transfusion needed . Possible goal when HCT returns to a safe level is to attempt a repeat D+C in OR and base management from these results . If EMBX/ D+C  can be performed there is consideration for a follow on endometrial ablation  Continue Megace  Add lysteda 1300 mg tid x5 days     Thank you for the opportunity to be involved with this pt's care.

## 2020-01-03 NOTE — ED Triage Notes (Addendum)
Pt in via POV, reports ongoing vaginal bleeding x approximately 3 weeks w/ weakness developing over the last few days.  Pt appears pale.  Vitals WDL.  Pt does report taking Eliquis.

## 2020-01-03 NOTE — ED Notes (Signed)
See triage note, pt c.o vaginal bleeding x  Weeks. States hx of the same with low hgb. C.o weakness. Pt appears pale

## 2020-01-03 NOTE — H&P (Signed)
History and Physical   Jasmine Osborn INO:[id number] DOB: 1951/03/07 DOA: 01/03/2020  Referring MD/NP/PA: Dr. Derrill Kay  PCP: Lauro Regulus, MD   Outpatient Specialists: Weakness and vaginal bleeding  Patient coming from: Home  Chief Complaint: Vaginal bleed and weakness  HPI: Jasmine Osborn is a 69 y.o. female with medical history significant of chronic atrial fibrillation on anticoagulation, chronic respiratory failure due to COPD, diabetes, morbid obesity, depression, hypertension, peripheral vascular disease, chronic stasis edema who came to the ER secondary to excessive vaginal bleed.  She has been having bright red blood as well as clots.  This been going on and off over the last 3 weeks.  This is getting worse in the last day.  Associated with significant shortness of breath initially exertional but now at rest.  Also noted generalized weakness.  She has felt dizzy occasionally.  Patient was seen in the ER found to have hemoglobin around 4 g.  Continued vaginal bleed also noted.  OB/GYN consulted but due to patient's multiple comorbid conditions medicine is asked to admit the patient well OB/GYN addresses her bleeding.  She is already been ordered 2 units of packed red blood cells in the ER and she has been admitted to our service.  Patient is weak but awake alert and communicating.  Answering all questions appropriately.  ED Course: Temperature 99.4 blood pressure 79/57, pulse 105 respiratory 28 oxygen sats 98% on room air.  Sodium 140 potassium 5.1 chloride 109 CO2 23 with glucose 131.  BUN is 30 creatinine 2.09 calcium 8.7.  Hemoglobin 4.2 with platelet 173.  PT 20.9 INR 1.8.  COVID-19 is negative.  Patient initiated on 2 units of packed red blood cells transfusion and she has been admitted to the medical service for treatment.  Review of Systems: As per HPI otherwise 10 point review of systems negative.    Past Medical History:  Diagnosis Date  . Atrial fibrillation  (HCC)   . Chronic respiratory failure with hypoxia and hypercapnia (HCC)   . Depression   . Diabetes mellitus without complication (HCC)   . Elevated lipids   . Hernia of abdominal wall   . Hypertension   . Increased endometrial stripe thickness 06/04/2017  . Increased endometrial stripe thickness 06/04/2017  . Obesity hypoventilation syndrome (HCC)   . Psychosis (HCC)   . PVD (peripheral vascular disease) (HCC)    heel ulcer Lt     Past Surgical History:  Procedure Laterality Date  . COLON SURGERY    . DILATION AND CURETTAGE OF UTERUS    . HERNIA REPAIR    . TIBIA FRACTURE SURGERY Left      reports that she has quit smoking. She has never used smokeless tobacco. She reports that she does not drink alcohol or use drugs.  No Known Allergies  Family History  Problem Relation Age of Onset  . Dementia Mother   . Valvular heart disease Father      Prior to Admission medications   Medication Sig Start Date End Date Taking? Authorizing Provider  fenofibrate 160 MG tablet Take 160 mg by mouth daily.   Yes [provider]  KLOR-CON 10 10 MEQ tablet Take 20 mEq by mouth daily.  10/22/16  Yes [provider]  latanoprost (XALATAN) 0.005 % ophthalmic solution USE 1 DROP IN [facility] EYE AT BEDTIME 05/18/15  Yes [provider]  losartan (COZAAR) 25 MG tablet Take 2 tablets (50 mg total) by mouth daily. 11/08/17  Yes Alford Highland, MD  megestrol (MEGACE) 40 MG tablet Take 80 mg by mouth 2 (two) times daily. 12/08/17  Yes [provider]  metFORMIN (GLUCOPHAGE) 500 MG tablet Take 500 mg by mouth 2 (two) times daily with a meal.   Yes [provider]  metoprolol succinate (TOPROL-XL) 50 MG 24 hr tablet Take 50 mg by mouth daily. 09/04/17  Yes [provider]  mirtazapine (REMERON) 30 MG tablet Take 30 mg by mouth at bedtime.   Yes [provider]  multivitamin-lutein (OCUVITE-LUTEIN) CAPS capsule Take 1 capsule by mouth daily. 11/09/17   Yes Wieting, Richard, MD  risperiDONE (RISPERDAL) 1 MG tablet Take 1 tablet (1 mg total) by mouth at bedtime. 01/03/17  Yes Enid Baas, MD  torsemide (DEMADEX) 20 MG tablet Take 20 mg by mouth daily.   Yes [provider]  nutrition supplement, JUVEN, (JUVEN) PACK Take 2 packets by mouth 2 (two) times daily between meals. Patient not taking: Reported on 01/03/2020 11/08/17   Alford Highland, MD    Physical Exam: Vitals:   01/03/20 2143 01/03/20 2200 01/03/20 2255 01/03/20 2300  BP: (!) 153/69 (!) 137/55 (!) 120/51 (!) 112/55  Pulse: 100 94 93 89  Resp: (!) 25 (!) 22 17 (!) 27  Temp: 98.6 F (37 C)  99.4 F (37.4 C)   TempSrc: Oral  Oral   SpO2: 100% 100% 100% 100%  Weight:   120.1 kg   Height:   5\' 4"  (1.626 m)       Constitutional: Morbidly obese, generalized , looks weak Vitals:   01/03/20 2143 01/03/20 2200 01/03/20 2255 01/03/20 2300  BP: (!) 153/69 (!) 137/55 (!) 120/51 (!) 112/55  Pulse: 100 94 93 89  Resp: (!) 25 (!) 22 17 (!) 27  Temp: 98.6 F (37 C)  99.4 F (37.4 C)   TempSrc: Oral  Oral   SpO2: 100% 100% 100% 100%  Weight:   120.1 kg   Height:   5\' 4"  (1.626 m)    Eyes: PERRL, lids and conjunctivae pale ENMT: Mucous membranes are dry. Posterior pharynx clear of any exudate or lesions.Normal dentition.  Neck: normal, supple, no masses, no thyromegaly Respiratory: Coarse breath sounds bilaterally, no wheezing, some basal crackles. Normal respiratory effort. No accessory muscle use.  Cardiovascular: Sinus tachycardia, no murmurs / rubs / gallops. No extremity edema. 2+ pedal pulses. No carotid bruits.  Abdomen: no tenderness, no masses palpated. No hepatosplenomegaly. Bowel sounds positive.  Musculoskeletal: no clubbing / cyanosis. No joint deformity upper and lower extremities. Good ROM, no contractures. Normal muscle tone.  Skin: no rashes, lesions, ulcers. No induration Neurologic: CN 2-12 grossly intact. Sensation intact, DTR normal. Strength  5/5 in all 4.  Psychiatric: Normal judgment and insight. Alert and oriented x 3.  Drowsy.     Labs on Admission: I have personally reviewed following labs and imaging studies  CBC: Recent Labs  Lab 01/03/20 1445  WBC 5.0  HGB 4.2*  HCT 17.3*  MCV 116.9*  PLT 173   Basic Metabolic Panel: Recent Labs  Lab 01/03/20 1445  NA 140  K 5.1  CL 109  CO2 23  GLUCOSE 131*  BUN 30*  CREATININE 2.09*  CALCIUM 8.7*   GFR: Estimated Creatinine Clearance: 32.4 mL/min (A) (by C-G formula based on SCr of 2.09 mg/dL (H)). Liver Function Tests: No results for input(s): AST, ALT, ALKPHOS, BILITOT, PROT, ALBUMIN in the last 168 hours. No results for input(s): LIPASE, AMYLASE in the last 168 hours. No results for input(s):  AMMONIA in the last 168 hours. Coagulation Profile: Recent Labs  Lab 01/03/20 1448  INR 1.8*   Cardiac Enzymes: No results for input(s): CKTOTAL, CKMB, CKMBINDEX, TROPONINI in the last 168 hours. BNP (last 3 results) No results for input(s): PROBNP in the last 8760 hours. HbA1C: No results for input(s): HGBA1C in the last 72 hours. CBG: No results for input(s): GLUCAP in the last 168 hours. Lipid Profile: No results for input(s): CHOL, HDL, LDLCALC, TRIG, CHOLHDL, LDLDIRECT in the last 72 hours. Thyroid Function Tests: No results for input(s): TSH, T4TOTAL, FREET4, T3FREE, THYROIDAB in the last 72 hours. Anemia Panel: No results for input(s): VITAMINB12, FOLATE, FERRITIN, TIBC, IRON, RETICCTPCT in the last 72 hours. Urine analysis:    Component Value Date/Time   COLORURINE YELLOW (A) 11/03/2017 0023   APPEARANCEUR CLEAR (A) 11/03/2017 0023   LABSPEC 1.018 11/03/2017 0023   PHURINE 5.0 11/03/2017 0023   GLUCOSEU NEGATIVE 11/03/2017 0023   HGBUR SMALL (A) 11/03/2017 0023   BILIRUBINUR NEGATIVE 11/03/2017 0023   KETONESUR NEGATIVE 11/03/2017 0023   PROTEINUR 30 (A) 11/03/2017 0023   NITRITE NEGATIVE 11/03/2017 0023   LEUKOCYTESUR NEGATIVE 11/03/2017 0023     Sepsis Labs: @LABRCNTIP (procalcitonin:4,lacticidven:4) ) Recent Results (from the past 240 hour(s))  Respiratory Panel by RT PCR (Flu A&B, Covid) - Nasopharyngeal Swab     Status: None   Collection Time: 01/03/20  5:26 PM   Specimen: Nasopharyngeal Swab  Result Value Ref Range Status   SARS Coronavirus 2 by RT PCR NEGATIVE NEGATIVE Final    Comment: (NOTE) SARS-CoV-2 target nucleic acids are NOT DETECTED. The SARS-CoV-2 RNA is generally detectable in upper respiratoy specimens during the acute phase of infection. The lowest concentration of SARS-CoV-2 viral copies this assay can detect is 131 copies/mL. A negative result does not preclude SARS-Cov-2 infection and should not be used as the sole basis for treatment or other patient management decisions. A negative result may occur with  improper specimen collection/handling, submission of specimen other than nasopharyngeal swab, presence of viral mutation(s) within the areas targeted by this assay, and inadequate number of viral copies (<131 copies/mL). A negative result must be combined with clinical observations, patient history, and epidemiological information. The expected result is Negative. Fact Sheet for Patients:  01/05/20 Fact Sheet for Healthcare Providers:  https://www.moore.com/ This test is not yet ap proved or cleared by the https://www.young.biz/ FDA and  has been authorized for detection and/or diagnosis of SARS-CoV-2 by FDA under an Emergency Use Authorization (EUA). This EUA will remain  in effect (meaning this test can be used) for the duration of the COVID-19 declaration under Section 564(b)(1) of the Act, 21 U.S.C. section 360bbb-3(b)(1), unless the authorization is terminated or revoked sooner.    Influenza A by PCR NEGATIVE NEGATIVE Final   Influenza B by PCR NEGATIVE NEGATIVE Final    Comment: (NOTE) The Xpert Xpress SARS-CoV-2/FLU/RSV assay is intended as an  aid in  the diagnosis of influenza from Nasopharyngeal swab specimens and  should not be used as a sole basis for treatment. Nasal washings and  aspirates are unacceptable for Xpert Xpress SARS-CoV-2/FLU/RSV  testing. Fact Sheet for Patients: [place] Fact Sheet for Healthcare Providers: https://www.moore.com/ This test is not yet approved or cleared by the https://www.young.biz/ FDA and  has been authorized for detection and/or diagnosis of SARS-CoV-2 by  FDA under an Emergency Use Authorization (EUA). This EUA will remain  in effect (meaning this test can be used) for the duration of the  Covid-19 declaration under Section 564(b)(1) of the Act, 21  U.S.C. section 360bbb-3(b)(1), unless the authorization is  terminated or revoked. Performed at [facility], [address] [place], [place] [address]      Radiological Exams on Admission: No results found.  EKG: Independently reviewed.  Sinus tachycardia otherwise no acute findings  Assessment/Plan Principal Problem:   Symptomatic anemia Active Problems:   Persistent atrial fibrillation (HCC)   Morbid obesity (HCC)   Vaginal bleeding   ARF (acute renal failure) (HCC)     #1 symptomatic anemia: Secondary to active vaginal bleed.  Patient being transfused at the moment.  OB/GYN consulted.  We will monitor H&H closely and continue transfuse as needed to keep hemoglobin close to 8 g at least.  Hold anticoagulation  #2 ongoing vaginal bleed: Follow gynecology recommendations.  #3 atrial fibrillation: Rate appears controlled.  Currently holding anticoagulation  #4 morbid obesity: Stable.  May need bariatric pain  #5 acute renal failure: Baseline creatinine was around 1.04.  Currently 2.09.  Hydrate patient and monitor.   DVT prophylaxis: SCD Code Status: Full code Family Communication: No family at bedside Disposition Plan: Home Consults called: OB/GYN Dr.  Ouida Sills Admission status: Inpatient to stepdown unit  Severity of Illness: The appropriate patient status for this patient is INPATIENT. Inpatient status is judged to be reasonable and necessary in order to provide the required intensity of service to ensure the patient's safety. The patient's presenting symptoms, physical exam findings, and initial radiographic and laboratory data in the context of their chronic comorbidities is felt to place them at high risk for further clinical deterioration. Furthermore, it is not anticipated that the patient will be medically stable for discharge from the hospital within 2 midnights of admission. The following factors support the patient status of inpatient.   " The patient's presenting symptoms include dizziness shortness of breath. " The worrisome physical exam findings include paleness and ongoing bleeding. " The initial radiographic and laboratory data are worrisome because of hemoglobin of 4.2. " The chronic co-morbidities include vaginal bleed which is recurrent.   * I certify that at the point of admission it is my clinical judgment that the patient will require inpatient hospital care spanning beyond 2 midnights from the point of admission due to high intensity of service, high risk for further deterioration and high frequency of surveillance required.Barbette Merino MD [facility] Pager [phone]  If 7PM-7AM, please contact night-coverage [email or web address] Password [facility]  01/03/2020, 11:31 PM

## 2020-01-03 NOTE — ED Provider Notes (Signed)
[facility] Emergency Department Provider Note  ____________________________________________   I have reviewed the triage vital signs and the nursing notes.   HISTORY  Chief Complaint Vaginal Bleeding   History limited by: Not Limited   HPI Jasmine Osborn is a 69 y.o. female who presents to the emergency department today because of concern for three week history of vaginal bleeding with associated weakness and shortness of breath.  The patient states that she has had this happen to her in the past.  She states that she has noticed both bright red blood and clots.  Her shortness of breath is gotten worse over the past couple of days.  The patient is on Eliquis for stroke prevention.  Patient states that she is talked to OB/GYN in the past and they have been hesitant to perform a hysterectomy due to concern for complicated surgery.   Records reviewed. Per medical record review patient has a history of admission 2 years ago for anemia and vaginal bleeding.   Past Medical History:  Diagnosis Date  . Atrial fibrillation (HCC)   . Chronic respiratory failure with hypoxia and hypercapnia (HCC)   . Depression   . Diabetes mellitus without complication (HCC)   . Elevated lipids   . Hernia of abdominal wall   . Hypertension   . Increased endometrial stripe thickness 06/04/2017  . Increased endometrial stripe thickness 06/04/2017  . Obesity hypoventilation syndrome (HCC)   . Psychosis (HCC)   . PVD (peripheral vascular disease) (HCC)    heel ulcer Lt     Patient Active Problem List   Diagnosis Date Noted  . Anemia 12/16/2017  . Persistent atrial fibrillation (HCC) 11/05/2017  . Morbid obesity (HCC) 11/05/2017  . Acute on chronic respiratory failure with hypoxia and hypercapnia (HCC) 11/02/2017  . Increased endometrial stripe thickness 06/04/2017  . Pressure injury of skin 12/23/2016  . Respiratory failure with hypercapnia (HCC) 12/22/2016    Past  Surgical History:  Procedure Laterality Date  . COLON SURGERY    . DILATION AND CURETTAGE OF UTERUS    . HERNIA REPAIR    . TIBIA FRACTURE SURGERY Left     Prior to Admission medications   Medication Sig Start Date End Date Taking? Authorizing Provider  fenofibrate 160 MG tablet Take 160 mg by mouth daily.    [provider]  KLOR-CON 10 10 MEQ tablet Take 20 mEq by mouth daily.  10/22/16   [provider]  latanoprost (XALATAN) 0.005 % ophthalmic solution USE 1 DROP IN [facility] EYE AT BEDTIME 05/18/15   [provider]  losartan (COZAAR) 25 MG tablet Take 2 tablets (50 mg total) by mouth daily. 11/08/17   Alford Highland, MD  megestrol (MEGACE) 40 MG tablet Take 80 mg by mouth 2 (two) times daily. 12/08/17   [provider]  metFORMIN (GLUCOPHAGE) 500 MG tablet Take 500 mg by mouth 2 (two) times daily with a meal.    [provider]  metoprolol succinate (TOPROL-XL) 50 MG 24 hr tablet Take 50 mg by mouth daily. 09/04/17   [provider]  mirtazapine (REMERON) 30 MG tablet Take 30 mg by mouth at bedtime.    [provider]  multivitamin-lutein (OCUVITE-LUTEIN) CAPS capsule Take 1 capsule by mouth daily. 11/09/17   Alford Highland, MD  nutrition supplement, JUVEN, (JUVEN) PACK Take 2 packets by mouth 2 (two) times daily between meals. 11/08/17   Alford Highland, MD  risperiDONE (RISPERDAL) 1 MG tablet Take 1 tablet (1  mg total) by mouth at bedtime. Patient taking differently: Take 1.5 mg by mouth at bedtime.  01/03/17   Gladstone Lighter, MD  torsemide (DEMADEX) 20 MG tablet Take 20 mg by mouth daily.    [provider]    Allergies Patient has no known allergies.  Family History  Problem Relation Age of Onset  . Dementia Mother   . Valvular heart disease Father     Social History Social History   Tobacco Use  . Smoking status: Former Research scientist (life sciences)  . Smokeless tobacco: Never Used  Substance Use Topics  . Alcohol use: No   . Drug use: No    Review of Systems Constitutional: No fever/chills Eyes: No visual changes. ENT: No sore throat. Cardiovascular: Denies chest pain. Respiratory: Positive for shortness of breath. Gastrointestinal: No abdominal pain.  No nausea, no vomiting.  No diarrhea.   Genitourinary: Positive for vaginal bleeding.  Musculoskeletal: Negative for back pain. Skin: Negative for rash. Neurological: Negative for headaches, focal weakness or numbness.  ____________________________________________   PHYSICAL EXAM:  VITAL SIGNS: ED Triage Vitals  Enc Vitals Group     BP 01/03/20 1431 (!) 131/45     Pulse Rate 01/03/20 1431 (!) 105     Resp 01/03/20 1431 17     Temp 01/03/20 1431 98 F (36.7 C)     Temp src --      SpO2 01/03/20 1431 100 %     Weight 01/03/20 1432 250 lb (113.4 kg)     Height 01/03/20 1432 5\' 4"  (1.626 m)     Head Circumference --      Peak Flow --      Pain Score 01/03/20 1431 0   Constitutional: Alert and oriented.  Eyes: Conjunctivae are normal.  ENT      Head: Normocephalic and atraumatic.      Nose: No congestion/rhinnorhea.      Mouth/Throat: Pale mucus membranes.      Neck: No stridor. Hematological/Lymphatic/Immunilogical: No cervical lymphadenopathy. Cardiovascular: Tachycardia, regular rhythm.  No murmurs, rubs, or gallops. Respiratory: Normal respiratory effort without tachypnea nor retractions. Breath sounds are clear and equal bilaterally. No wheezes/rales/rhonchi. Gastrointestinal: Soft and non tender. No rebound. No guarding.  Genitourinary: Deferred Musculoskeletal: Normal range of motion in all extremities. No lower extremity edema. Neurologic:  Normal speech and language. No gross focal neurologic deficits are appreciated.  Skin:  Skin is warm, dry and intact. No rash noted. Psychiatric: Mood and affect are normal. Speech and behavior are normal. Patient exhibits appropriate insight and  judgment.  ____________________________________________    LABS (pertinent positives/negatives)  CBC wbc 5.0, hgb 4.2, plt 173 BMP na 140, k 5.1, glu 141, cr 2.09  ____________________________________________   EKG  None  ____________________________________________    RADIOLOGY  None  ____________________________________________   PROCEDURES  Procedures  CRITICAL CARE Performed by: Nance Pear   Total critical care time: 35 minutes  Critical care time was exclusive of separately billable procedures and treating other patients.  Critical care was necessary to treat or prevent imminent or life-threatening deterioration.  Critical care was time spent personally by me on the following activities: development of treatment plan with patient and/or surrogate as well as nursing, discussions with consultants, evaluation of patient's response to treatment, examination of patient, obtaining history from patient or surrogate, ordering and performing treatments and interventions, ordering and review of laboratory studies, ordering and review of radiographic studies, pulse oximetry and re-evaluation of patient's condition.  ____________________________________________   INITIAL IMPRESSION /  ASSESSMENT AND PLAN / ED COURSE  Pertinent labs & imaging results that were available during my care of the patient were reviewed by me and considered in my medical decision making (see chart for details).   Patient presented to the emergency department today because of concerns for vaginal bleeding and associated shortness of breath and weakness.  Patient states she is on Eliquis for stroke prevention.  On exam here she is tachycardic.  She does appear pale.  Blood work was notable for significant anemia with hemoglobin of 4.2.  I did discuss and consent patient for blood transfusion.  Additionally will give patient Kcentra to try to reverse Eliquis given significant anemia, continued  bleeding and tachycardia.  I discussed with Dr. Feliberto Gottron with OB/GYN.  They will plan on admission.   ____________________________________________   FINAL CLINICAL IMPRESSION(S) / ED DIAGNOSES  Final diagnoses:  Vaginal bleeding  Symptomatic anemia     Note: This dictation was prepared with Dragon dictation. Any transcriptional errors that result from this process are unintentional     Phineas Semen, MD 01/03/20 1724

## 2020-01-03 NOTE — H&P (View-Only) (Signed)
Consult History and Physical   SERVICE: Gynecology [facility]  Patient Name: Jasmine Osborn Patient MRN:   [id number]  CC:4 weeks of vaginal bleeding. Now with SOB  + clot passage   HPI: Jasmine Osborn is a 69 y.o.  Pt with recurrent uterine bleeding. She was seen PMB 2015 ,EMBX done that showed simple endometrial hyperplasia. An IUD Mirena was placed. She was brought back 2016 to repeat an [facility] but this was unsuccessful. Serial TVUs show an increasing endometrial stripe despite Megace tx . She underwent a failed attempt to sample the lining of the utx in 2017 and she was referred to Gyn/onc. Office EMBX ( Dr Theora Gianotti) was benign and she was keep on Megace . In 12/2017 she was admitted again with heavy menorrhagia and a Hgb of 5.8 and after transfussion 7.7. At that admission MRI showed utx 14x13 cm and heterogenous stripe at 28 mm. Ctscan showed a possible left sidewall adenopathy. She has not had additional GYN care since that admission . Hgb today 4.2 .Pt was on Plavix for A fib which was stopped tonight in the ED Review of Systems: positives in bold GEN:   fevers, chills, weight changes, appetite changes, fatigue, night sweats HEENT:  HA, vision changes, hearing loss, congestion, rhinorrhea, sinus pressure, dysphagia CV:   CP, palpitations PULM:  SOB, cough GI:  abd pain, N/V/D/C GU:  dysuria, urgency, frequency, + heavy vaginal bleeding  MSK:  arthralgias, myalgias, back pain, swelling SKIN:  rashes, color changes, pallor NEURO:  numbness, weakness, tingling, seizures, dizziness, tremors PSYCH:  depression, anxiety, behavioral problems, confusion  HEME/LYMPH:  easy bruising or bleeding ENDO:  heat/cold intolerance  Past Obstetrical History: OB History   No obstetric history on file.    G2 P2  Past Gynecologic History:  PMB  Past Medical History: Past Medical History:  Diagnosis Date  . Atrial fibrillation ([place])   . Chronic respiratory failure with hypoxia and  hypercapnia (HCC)   . Depression   . Diabetes mellitus without complication ([place])   . Elevated lipids   . Hernia of abdominal wall   . Hypertension   . Increased endometrial stripe thickness 06/04/2017  . Increased endometrial stripe thickness 06/04/2017  . Obesity hypoventilation syndrome ([place])   . Psychosis ([place])   . PVD (peripheral vascular disease) (HCC)    heel ulcer Lt     Past Surgical History:   Past Surgical History:  Procedure Laterality Date  . COLON SURGERY    . DILATION AND CURETTAGE OF UTERUS    . HERNIA REPAIR    . TIBIA FRACTURE SURGERY Left     Family History:  family history includes Dementia in her mother; Valvular heart disease in her father.  Social History:  Social History   Socioeconomic History  . Marital status: Married    Spouse name: Not on file  . Number of children: Not on file  . Years of education: Not on file  . Highest education level: Not on file  Occupational History  . Not on file  Tobacco Use  . Smoking status: Former Research scientist (life sciences)  . Smokeless tobacco: Never Used  Substance and Sexual Activity  . Alcohol use: No  . Drug use: No  . Sexual activity: Not on file  Other Topics Concern  . Not on file  Social History Narrative  . Not on file   Social Determinants of Health   Financial Resource Strain:   . Difficulty of Paying Living Expenses:   Food Insecurity:   .  Worried About Programme researcher, broadcasting/film/video in the Last Year:   . Barista in the Last Year:   Transportation Needs:   . Freight forwarder (Medical):   Marland Kitchen Lack of Transportation (Non-Medical):   Physical Activity:   . Days of Exercise per Week:   . Minutes of Exercise per Session:   Stress:   . Feeling of Stress :   Social Connections:   . Frequency of Communication with Friends and Family:   . Frequency of Social Gatherings with Friends and Family:   . Attends Religious Services:   . Active Member of Clubs or Organizations:   . Attends Banker  Meetings:   Marland Kitchen Marital Status:   Intimate Partner Violence:   . Fear of Current or Ex-Partner:   . Emotionally Abused:   Marland Kitchen Physically Abused:   . Sexually Abused:     Home Medications:  Medications reconciled in EPIC  No current facility-administered medications on file prior to encounter.   Current Outpatient Medications on File Prior to Encounter  Medication Sig Dispense Refill  . fenofibrate 160 MG tablet Take 160 mg by mouth daily.    Marland Kitchen KLOR-CON 10 10 MEQ tablet Take 20 mEq by mouth daily.   0  . latanoprost (XALATAN) 0.005 % ophthalmic solution USE 1 DROP IN [facility] EYE AT BEDTIME    . losartan (COZAAR) 25 MG tablet Take 2 tablets (50 mg total) by mouth daily. 30 tablet 0  . megestrol (MEGACE) 40 MG tablet Take 80 mg by mouth 2 (two) times daily.  11  . metFORMIN (GLUCOPHAGE) 500 MG tablet Take 500 mg by mouth 2 (two) times daily with a meal.    . metoprolol succinate (TOPROL-XL) 50 MG 24 hr tablet Take 50 mg by mouth daily.  4  . mirtazapine (REMERON) 30 MG tablet Take 30 mg by mouth at bedtime.    . multivitamin-lutein (OCUVITE-LUTEIN) CAPS capsule Take 1 capsule by mouth daily. 30 capsule 0  . nutrition supplement, JUVEN, (JUVEN) PACK Take 2 packets by mouth 2 (two) times daily between meals. 120 packet 0  . risperiDONE (RISPERDAL) 1 MG tablet Take 1 tablet (1 mg total) by mouth at bedtime. (Patient taking differently: Take 1.5 mg by mouth at bedtime. ) 30 tablet 0  . torsemide (DEMADEX) 20 MG tablet Take 20 mg by mouth daily.      Allergies:  No Known Allergies  Physical Exam:  Temp:  [98 F (36.7 C)-98.7 F (37.1 C)] 98.4 F (36.9 C) (03/29 1833) Pulse Rate:  [92-105] 95 (03/29 1900) Resp:  [17-26] 22 (03/29 1900) BP: (117-131)/(43-58) 122/49 (03/29 1900) SpO2:  [98 %-100 %] 98 % (03/29 1900) Weight:  [113.4 kg] 113.4 kg (03/29 1432)   General Appearance:  Ill appearing , pale female  HEENT:  Normocephalic atraumatic, extraocular movements intact, moist mucous  membranes Cardiovascular:  Normal S1/S2, regular rate and rhythm, no murmurs Pulmonary:  clear to auscultation, no wheezes, rales or rhonchi, symmetric air entry, good air exchange Abdomen:  Bowel sounds present, soft, nontender, nondistended, no abnormal masses, no epigastric pain, large protruding left lower Abd wall  Psychiatric:  Normal mood and affect, appropriate, no AH/VH Pelvic:   Limited bimanual exam  Mons edematous Single digit exam with pt in a recumbant position Cx palpated , no masses appreciated . + non- clotted blood on gloves    Labs/Studies:   CBC and Coags:  Lab Results  Component Value Date   WBC 5.0  01/03/2020   NEUTOPHILPCT 79 12/16/2017   EOSPCT 3 12/16/2017   BASOPCT 0 12/16/2017   LYMPHOPCT 13 12/16/2017   HGB 4.2 (LL) 01/03/2020   HCT 17.3 (L) 01/03/2020   MCV 116.9 (H) 01/03/2020   PLT 173 01/03/2020   INR 1.8 (H) 01/03/2020   CMP:  Lab Results  Component Value Date   NA 140 01/03/2020   K 5.1 01/03/2020   CL 109 01/03/2020   CO2 23 01/03/2020   BUN 30 (H) 01/03/2020   CREATININE 2.09 (H) 01/03/2020   CREATININE 1.35 (H) 12/17/2017   CREATININE 1.44 (H) 12/16/2017   PROT 6.7 12/16/2017   BILITOT 0.5 12/16/2017   BILIDIR 0.4 12/22/2016   ALT 11 (L) 12/16/2017   AST 28 12/16/2017   ALKPHOS 62 12/16/2017    Other Imaging: No results found.   Assessment / Plan:   ABBE BULA is a 69 y.o. No obstetric history on file. who presents with PMB and severe anemia  1. At risk for endometrial cancer given her prior history . She needs a TVUS and ctscan pelvic to better assess Recommend Hospitalist service admit pt given her other co morbitities Blood transfusion needed . Possible goal when HCT returns to a safe level is to attempt a repeat D+C in OR and base management from these results . If EMBX/ D+C  can be performed there is consideration for a follow on endometrial ablation  Continue Megace  Add lysteda 1300 mg tid x5 days     Thank you for the opportunity to be involved with this pt's care.

## 2020-01-03 NOTE — ED Notes (Signed)
Pt is difficult stick; this RN able to place PIV but with minimal blood return.  Lab notified to collect necessary blood specimens.

## 2020-01-03 NOTE — ED Notes (Signed)
Pt chux pad changed, light/small amount of blood noted to pad.

## 2020-01-03 NOTE — ED Notes (Signed)
Attempted to call report to CCU, was told RN unavailable at the moment and would call RN back

## 2020-01-03 NOTE — ED Notes (Signed)
Pt given sandwich tray and ice water. Ok per dr Derrill Kay

## 2020-01-04 ENCOUNTER — Inpatient Hospital Stay: Payer: Medicare HMO

## 2020-01-04 DIAGNOSIS — L03116 Cellulitis of left lower limb: Secondary | ICD-10-CM

## 2020-01-04 DIAGNOSIS — N939 Abnormal uterine and vaginal bleeding, unspecified: Secondary | ICD-10-CM

## 2020-01-04 DIAGNOSIS — L97521 Non-pressure chronic ulcer of other part of left foot limited to breakdown of skin: Secondary | ICD-10-CM

## 2020-01-04 DIAGNOSIS — D519 Vitamin B12 deficiency anemia, unspecified: Secondary | ICD-10-CM

## 2020-01-04 DIAGNOSIS — I480 Paroxysmal atrial fibrillation: Secondary | ICD-10-CM

## 2020-01-04 DIAGNOSIS — D500 Iron deficiency anemia secondary to blood loss (chronic): Secondary | ICD-10-CM

## 2020-01-04 LAB — IRON AND TIBC
Iron: 92 ug/dL (ref 28–170)
Saturation Ratios: 22 % (ref 10.4–31.8)
TIBC: 423 ug/dL (ref 250–450)
UIBC: 331 ug/dL

## 2020-01-04 LAB — CBC
HCT: 27.6 % — ABNORMAL LOW (ref 36.0–46.0)
HCT: 29.4 % — ABNORMAL LOW (ref 36.0–46.0)
Hemoglobin: 8 g/dL — ABNORMAL LOW (ref 12.0–15.0)
Hemoglobin: 8.3 g/dL — ABNORMAL LOW (ref 12.0–15.0)
MCH: 28.1 pg (ref 26.0–34.0)
MCH: 28.4 pg (ref 26.0–34.0)
MCHC: 28.2 g/dL — ABNORMAL LOW (ref 30.0–36.0)
MCHC: 29 g/dL — ABNORMAL LOW (ref 30.0–36.0)
MCV: 97.9 fL (ref 80.0–100.0)
MCV: 99.7 fL (ref 80.0–100.0)
Platelets: 165 10*3/uL (ref 150–400)
Platelets: 173 10*3/uL (ref 150–400)
RBC: 2.82 MIL/uL — ABNORMAL LOW (ref 3.87–5.11)
RBC: 2.95 MIL/uL — ABNORMAL LOW (ref 3.87–5.11)
RDW: 23.3 % — ABNORMAL HIGH (ref 11.5–15.5)
RDW: 23.8 % — ABNORMAL HIGH (ref 11.5–15.5)
WBC: 6 10*3/uL (ref 4.0–10.5)
WBC: 6.9 10*3/uL (ref 4.0–10.5)
nRBC: 0.5 % — ABNORMAL HIGH (ref 0.0–0.2)
nRBC: 0.9 % — ABNORMAL HIGH (ref 0.0–0.2)

## 2020-01-04 LAB — HIV ANTIBODY (ROUTINE TESTING W REFLEX): HIV Screen 4th Generation wRfx: NONREACTIVE

## 2020-01-04 LAB — COMPREHENSIVE METABOLIC PANEL
ALT: 12 U/L (ref 0–44)
AST: 13 U/L — ABNORMAL LOW (ref 15–41)
Albumin: 2.9 g/dL — ABNORMAL LOW (ref 3.5–5.0)
Alkaline Phosphatase: 35 U/L — ABNORMAL LOW (ref 38–126)
Anion gap: 7 (ref 5–15)
BUN: 27 mg/dL — ABNORMAL HIGH (ref 8–23)
CO2: 25 mmol/L (ref 22–32)
Calcium: 8.6 mg/dL — ABNORMAL LOW (ref 8.9–10.3)
Chloride: 110 mmol/L (ref 98–111)
Creatinine, Ser: 1.82 mg/dL — ABNORMAL HIGH (ref 0.44–1.00)
GFR calc Af Amer: 32 mL/min — ABNORMAL LOW (ref >60)
GFR calc non Af Amer: 28 mL/min — ABNORMAL LOW (ref >60)
Glucose, Bld: 97 mg/dL (ref 70–99)
Potassium: 4.8 mmol/L (ref 3.5–5.1)
Sodium: 142 mmol/L (ref 135–145)
Total Bilirubin: 0.9 mg/dL (ref 0.3–1.2)
Total Protein: 5.8 g/dL — ABNORMAL LOW (ref 6.5–8.1)

## 2020-01-04 LAB — FERRITIN: Ferritin: 31 ng/mL (ref 11–307)

## 2020-01-04 LAB — PREPARE RBC (CROSSMATCH)

## 2020-01-04 LAB — HEMOGLOBIN AND HEMATOCRIT, BLOOD
HCT: 25.2 % — ABNORMAL LOW (ref 36.0–46.0)
Hemoglobin: 7 g/dL — ABNORMAL LOW (ref 12.0–15.0)

## 2020-01-04 LAB — GLUCOSE, CAPILLARY: Glucose-Capillary: 140 mg/dL — ABNORMAL HIGH (ref 70–99)

## 2020-01-04 LAB — SEDIMENTATION RATE: Sed Rate: 58 mm/hr — ABNORMAL HIGH (ref 0–30)

## 2020-01-04 LAB — MRSA PCR SCREENING: MRSA by PCR: NEGATIVE

## 2020-01-04 LAB — VITAMIN B12: Vitamin B-12: 189 pg/mL (ref 180–914)

## 2020-01-04 MED ORDER — FENOFIBRATE 160 MG PO TABS
160.0000 mg | ORAL_TABLET | Freq: Every day | ORAL | Status: DC
  Administered 2020-01-04 – 2020-01-10 (×6): 160 mg via ORAL
  Filled 2020-01-04 (×7): qty 1

## 2020-01-04 MED ORDER — OCUVITE-LUTEIN PO CAPS
1.0000 | ORAL_CAPSULE | Freq: Every day | ORAL | Status: DC
  Administered 2020-01-04 – 2020-01-10 (×6): 1 via ORAL
  Filled 2020-01-04 (×7): qty 1

## 2020-01-04 MED ORDER — TORSEMIDE 20 MG PO TABS
20.0000 mg | ORAL_TABLET | Freq: Every day | ORAL | Status: DC
  Administered 2020-01-06 – 2020-01-10 (×5): 20 mg via ORAL
  Filled 2020-01-04 (×5): qty 1

## 2020-01-04 MED ORDER — CHLORHEXIDINE GLUCONATE 0.12 % MT SOLN
15.0000 mL | Freq: Two times a day (BID) | OROMUCOSAL | Status: DC
  Administered 2020-01-04 – 2020-01-10 (×13): 15 mL via OROMUCOSAL
  Filled 2020-01-04 (×10): qty 15

## 2020-01-04 MED ORDER — MIRTAZAPINE 15 MG PO TABS
30.0000 mg | ORAL_TABLET | Freq: Every day | ORAL | Status: DC
  Administered 2020-01-04 – 2020-01-09 (×5): 30 mg via ORAL
  Filled 2020-01-04 (×5): qty 2

## 2020-01-04 MED ORDER — LOSARTAN POTASSIUM 50 MG PO TABS
50.0000 mg | ORAL_TABLET | Freq: Every day | ORAL | Status: DC
  Administered 2020-01-04: 50 mg via ORAL
  Filled 2020-01-04: qty 1

## 2020-01-04 MED ORDER — CEFAZOLIN SODIUM-DEXTROSE 2-4 GM/100ML-% IV SOLN
2.0000 g | Freq: Two times a day (BID) | INTRAVENOUS | Status: DC
  Filled 2020-01-04 (×2): qty 100

## 2020-01-04 MED ORDER — METOPROLOL SUCCINATE ER 50 MG PO TB24
50.0000 mg | ORAL_TABLET | Freq: Every day | ORAL | Status: DC
  Administered 2020-01-04 – 2020-01-08 (×5): 50 mg via ORAL
  Filled 2020-01-04 (×5): qty 1

## 2020-01-04 MED ORDER — SODIUM CHLORIDE 0.9% IV SOLUTION: Freq: Once | INTRAVENOUS | Status: DC

## 2020-01-04 MED ORDER — ORAL CARE MOUTH RINSE
15.0000 mL | Freq: Two times a day (BID) | OROMUCOSAL | Status: DC
  Administered 2020-01-06 – 2020-01-07 (×3): 15 mL via OROMUCOSAL

## 2020-01-04 MED ORDER — CEFAZOLIN SODIUM-DEXTROSE 2-4 GM/100ML-% IV SOLN
2.0000 g | Freq: Three times a day (TID) | INTRAVENOUS | Status: DC
  Administered 2020-01-04 – 2020-01-08 (×11): 2 g via INTRAVENOUS
  Filled 2020-01-04 (×17): qty 100

## 2020-01-04 MED ORDER — FUROSEMIDE 10 MG/ML IJ SOLN
60.0000 mg | Freq: Once | INTRAMUSCULAR | Status: AC
  Administered 2020-01-04: 11:00:00 60 mg via INTRAVENOUS
  Filled 2020-01-04: qty 6

## 2020-01-04 MED ORDER — SODIUM CHLORIDE 0.9 % IV SOLN
INTRAVENOUS | Status: DC | PRN
  Administered 2020-01-04: 1000 mL via INTRAVENOUS
  Administered 2020-01-06 – 2020-01-08 (×3): 250 mL via INTRAVENOUS

## 2020-01-04 MED ORDER — RISPERIDONE 1 MG PO TABS
1.0000 mg | ORAL_TABLET | Freq: Every day | ORAL | Status: DC
  Administered 2020-01-04 – 2020-01-09 (×4): 1 mg via ORAL
  Filled 2020-01-04 (×8): qty 1

## 2020-01-04 MED ORDER — FUROSEMIDE 10 MG/ML IJ SOLN
80.0000 mg | Freq: Once | INTRAMUSCULAR | Status: AC
  Administered 2020-01-04: 80 mg via INTRAVENOUS
  Filled 2020-01-04: qty 8

## 2020-01-04 MED ORDER — LATANOPROST 0.005 % OP SOLN
1.0000 [drp] | Freq: Every day | OPHTHALMIC | Status: DC
  Administered 2020-01-04 – 2020-01-09 (×6): 1 [drp] via OPHTHALMIC
  Filled 2020-01-04: qty 2.5

## 2020-01-04 MED ORDER — CEFAZOLIN SODIUM-DEXTROSE 2-4 GM/100ML-% IV SOLN
2.0000 g | Freq: Once | INTRAVENOUS | Status: DC
  Filled 2020-01-04: qty 100

## 2020-01-04 MED ORDER — CYANOCOBALAMIN 1000 MCG/ML IJ SOLN
1000.0000 ug | Freq: Every day | INTRAMUSCULAR | Status: DC
  Administered 2020-01-04 – 2020-01-10 (×6): 1000 ug via INTRAMUSCULAR
  Filled 2020-01-04 (×7): qty 1

## 2020-01-04 MED ORDER — POTASSIUM CHLORIDE CRYS ER 20 MEQ PO TBCR
20.0000 meq | EXTENDED_RELEASE_TABLET | Freq: Every day | ORAL | Status: DC
  Administered 2020-01-04 – 2020-01-10 (×6): 20 meq via ORAL
  Filled 2020-01-04 (×6): qty 1

## 2020-01-04 MED ORDER — SODIUM CHLORIDE 0.9 % IV SOLN
400.0000 mg | Freq: Once | INTRAVENOUS | Status: AC
  Administered 2020-01-04: 11:00:00 400 mg via INTRAVENOUS
  Filled 2020-01-04: qty 20

## 2020-01-04 MED ORDER — COLLAGENASE 250 UNIT/GM EX OINT
TOPICAL_OINTMENT | Freq: Every day | CUTANEOUS | Status: DC
  Administered 2020-01-07 – 2020-01-10 (×2): 1 via TOPICAL
  Filled 2020-01-04 (×2): qty 30

## 2020-01-04 MED ORDER — TRANEXAMIC ACID 650 MG PO TABS
1300.0000 mg | ORAL_TABLET | Freq: Three times a day (TID) | ORAL | Status: DC
  Administered 2020-01-04 – 2020-01-10 (×15): 1300 mg via ORAL
  Filled 2020-01-04 (×20): qty 2

## 2020-01-04 NOTE — Consult Note (Addendum)
Pharmacy Antibiotic Note  Jasmine Osborn is a 69 y.o. female admitted on 01/03/2020 with vaginal bleeding. Upon further examination the patient was found to have a stage 2 pressure injury of the left foot.  Pharmacy has been consulted for cefazolin dosing. Her renal function is less than baseline but improved since admission  Plan:   Start cefazolin 2 grams IV every 8 hours  Height: 5\' 4"  (162.6 cm) Weight: 264 lb 12.4 oz (120.1 kg) IBW/kg (Calculated) : 54.7  Temp (24hrs), Avg:98.4 F (36.9 C), Min:97.8 F (36.6 C), Max:99.4 F (37.4 C)  Recent Labs  Lab 01/03/20 1445 01/04/20 0716  WBC 5.0 6.0  CREATININE 2.09* 1.82*    Estimated Creatinine Clearance: 37.3 mL/min (A) (by C-G formula based on SCr of 1.82 mg/dL (H)).    No Known Allergies  Antimicrobials this admission: cefazolin 3/30 >>   Microbiology results: 3/29 SARS CoV-2: negative  3/29 [place] A/B: negative 3/29 MRSA PCR: negative  Thank you for allowing pharmacy to be a part of this patient's care.  4/29 01/04/2020 9:03 AM

## 2020-01-04 NOTE — NC FL2 (Signed)
[place] MEDICAID FL2 LEVEL OF CARE SCREENING TOOL     IDENTIFICATION  Patient Name: Jasmine Osborn Birthdate: 1951/05/30 Sex: female Admission Date (Current Location): 01/03/2020  [place] and [place] Number:  Chiropodist and Address:  [facility], [address] [place], [place] [address]      Provider Number: [id number]  Attending Physician Name and Address:  Alford Highland, MD  Relative Name and Phone Number:  Kyriaki Moder - [phone]    Current Level of Care: SNF Recommended Level of Care: Skilled Nursing Facility Prior Approval Number:    Date Approved/Denied:   PASRR Number:    Discharge Plan: SNF    Current Diagnoses: Patient Active Problem List   Diagnosis Date Noted  . Cellulitis of left lower extremity   . Skin ulcer of toe of left foot, limited to breakdown of skin (HCC)   . AF (paroxysmal atrial fibrillation) (HCC)   . Symptomatic anemia 01/03/2020  . Vaginal bleeding 01/03/2020  . ARF (acute renal failure) (HCC) 01/03/2020  . Anemia 12/16/2017  . Persistent atrial fibrillation (HCC) 11/05/2017  . Morbid obesity (HCC) 11/05/2017  . Acute on chronic respiratory failure with hypoxia and hypercapnia (HCC) 11/02/2017  . Increased endometrial stripe thickness 06/04/2017  . Pressure injury of skin 12/23/2016  . Respiratory failure with hypercapnia (HCC) 12/22/2016    Orientation RESPIRATION BLADDER Height & Weight     Self, Time, Situation, Place  O2(3L nasal cannula) Incontinent, External catheter Weight: 264 lb 12.4 oz (120.1 kg) Height:  5\' 4"  (162.6 cm)  BEHAVIORAL SYMPTOMS/MOOD NEUROLOGICAL BOWEL NUTRITION STATUS      Continent Diet(heart diet, think liquids)  AMBULATORY STATUS COMMUNICATION OF NEEDS Skin   Limited Assist Verbally Other (Comment)(excoriated breast, chest, groin, labia, perineum)                       Personal Care Assistance Level of Assistance  Bathing, Feeding, Dressing  Bathing Assistance: Maximum assistance Feeding assistance: Maximum assistance Dressing Assistance: Maximum assistance     Functional Limitations Info             SPECIAL CARE FACTORS FREQUENCY  OT (By licensed OT), PT (By licensed PT)     PT Frequency: 5x/week OT Frequency: 5x/week            Contractures Contractures Info: Not present    Additional Factors Info  Code Status, Allergies Code Status Info: Full Code Allergies Info: NKA           Current Medications (01/04/2020):  This is the current hospital active medication list Current Facility-Administered Medications  Medication Dose Route Frequency Provider Last Rate Last Admin  . 0.9 %  sodium chloride infusion (Manually program via Guardrails IV Fluids)   Intravenous Once 01/06/2020, NP      . 0.9 %  sodium chloride infusion   Intravenous PRN Manuela Schwartz, MD 5 mL/hr at 01/04/20 1400 Rate Verify at 01/04/20 1400  . ceFAZolin (ANCEF) IVPB 2g/100 mL premix  2 g Intravenous Q8H 01/06/20, RPH 200 mL/hr at 01/04/20 1433 2 g at 01/04/20 1433  . chlorhexidine (PERIDEX) 0.12 % solution 15 mL  15 mL Mouth Rinse BID 01/06/20, MD   15 mL at 01/04/20 1439  . Chlorhexidine Gluconate Cloth 2 % PADS 6 each  6 each Topical Q0600 01/06/20, MD   6 each at 01/03/20 2300  . collagenase (SANTYL) ointment   Topical Daily 01/05/20, MD      .  cyanocobalamin ((VITAMIN B-12)) injection 1,000 mcg  1,000 mcg Intramuscular Daily Loletha Grayer, MD   1,000 mcg at 01/04/20 1434  . fenofibrate tablet 160 mg  160 mg Oral Daily Loletha Grayer, MD   160 mg at 01/04/20 1043  . latanoprost (XALATAN) 0.005 % ophthalmic solution 1 drop  1 drop Both Eyes QHS Wieting, Richard, MD      . MEDLINE mouth rinse  15 mL Mouth Rinse q12n4p Wieting, Richard, MD      . metoprolol succinate (TOPROL-XL) 24 hr tablet 50 mg  50 mg Oral Daily Loletha Grayer, MD   50 mg at 01/04/20 1043  . mirtazapine (REMERON) tablet 30 mg   30 mg Oral QHS Wieting, Richard, MD      . multivitamin-lutein (OCUVITE-LUTEIN) capsule 1 capsule  1 capsule Oral Daily Loletha Grayer, MD   1 capsule at 01/04/20 1043  . ondansetron (ZOFRAN) tablet 4 mg  4 mg Oral Q6H PRN Elwyn Reach, MD       Or  . ondansetron (ZOFRAN) injection 4 mg  4 mg Intravenous Q6H PRN Gala Romney L, MD      . potassium chloride SA (KLOR-CON) CR tablet 20 mEq  20 mEq Oral Daily Loletha Grayer, MD   20 mEq at 01/04/20 1043  . risperiDONE (RISPERDAL) tablet 1 mg  1 mg Oral QHS Wieting, Richard, MD      . Derrill Memo ON 01/05/2020] torsemide (DEMADEX) tablet 20 mg  20 mg Oral Daily Loletha Grayer, MD         Discharge Medications: Please see discharge summary for a list of discharge medications.  Relevant Imaging Results:  Relevant Lab Results:   Additional Information SSN: [id number]  Amelia, LCSW

## 2020-01-04 NOTE — Evaluation (Signed)
Physical Therapy Evaluation Patient Details Name: Jasmine Osborn MRN: [id number] DOB: 12/30/1950 Today's Date: 01/04/2020   History of Present Illness  Pt is 69 yo female that presented to ED for vaginal bleeding, SOB, and generalized weakness. Work up showed hemoglobin of 4, has had multiple transfusions, currently 8.0. PMH of afib, COPD, DM, morbid obesity, depression, HTN, PVD, chronic stasis edema, previous smoker.    Clinical Impression  Pt seated in chair at start of session, vitals WFLs. Stated in the last few weeks she has needed more assistance with ADLs, has had difficulty walking, does not use an AD at baseline unless she "needs" to. Husband able to assist 24/7 if needed.  The patient needed extensive UE support to sit forward in chair and maintain position. Pt exhibited generalized weakness, unable to lift LE's without assistance in recliner. Sit <> Stand performed with RW and CGA, pt unable to maintain standing long due to fatigue and modA for controlled return to sitting. Of note, pt purewick dislodged upon standing, unable to obtain second assist to help with donning of diaper; due to blood on floor and pt fatigue, further mobility held. Pt reported she is not back at her baseline level strength.  Overall the patient demonstrated deficits (see "PT Problem List") that impede the patient's functional abilities, safety, and mobility and would benefit from skilled PT intervention. Recommendation SNF pending pt progress.      Follow Up Recommendations SNF    Equipment Recommendations  Other (comment)(TBD at next venue of care)    Recommendations for Other Services       Precautions / Restrictions Precautions Precautions: Fall Restrictions Weight Bearing Restrictions: No      Mobility  Bed Mobility               General bed mobility comments: Pt up in chair at start of PT session  Transfers Overall transfer level: Needs assistance Equipment used: Rolling walker  (2 wheeled) Transfers: Sit to/from Stand Sit to Stand: Min guard         General transfer comment: CGA, modA for controlled descent to chair  Ambulation/Gait             General Gait Details: deferred  Stairs            Wheelchair Mobility    Modified Rankin (Stroke Patients Only)       Balance Overall balance assessment: Needs assistance Sitting-balance support: Feet supported Sitting balance-Leahy Scale: Poor Sitting balance - Comments: reliant on UE to maintain balance     Standing balance-Leahy Scale: Poor                               Pertinent Vitals/Pain Pain Assessment: No/denies pain    Home Living Family/patient expects to be discharged to:: Private residence Living Arrangements: Spouse/significant other Available Help at Discharge: Family;Available 24 hours/day Type of Home: House Home Access: Stairs to enter;Ramped entrance   Entrance Stairs-Number of Steps: 5 Home Layout: One level Home Equipment: Walker - 2 wheels;Grab bars - toilet;Bedside commode;Wheelchair - manual;Cane - single point Additional Comments: Pt stated no falls in the last 6 months. wears O2 all day, has for about 6 months.    Prior Function Level of Independence: Needs assistance      ADL's / Homemaking Assistance Needed: normally doesnt need assistance for bathing/dressing but in the last few weeks has needed assistance from husband  Hand Dominance   Dominant Hand: Right    Extremity/Trunk Assessment   Upper Extremity Assessment Upper Extremity Assessment: Generalized weakness    Lower Extremity Assessment Lower Extremity Assessment: Generalized weakness    Cervical / Trunk Assessment Cervical / Trunk Assessment: Kyphotic;Other exceptions Cervical / Trunk Exceptions: abdominal hernia  Communication   Communication: No difficulties  Cognition Arousal/Alertness: Awake/alert Behavior During Therapy: WFL for tasks assessed/performed                                    General Comments: oriented to self, place, disoriented to time (cued for year)      General Comments      Exercises Other Exercises Other Exercises: Pt mobility limited by pt vaginal bleeding. unable to don brief once in standing due to pt fatigue and weakness, unable to obtain second person assist. RN notified of pt status, seated in chair with setup for lunch.   Assessment/Plan    PT Assessment Patient needs continued PT services  PT Problem List Decreased strength;Decreased mobility;Decreased range of motion;Decreased activity tolerance;Decreased balance;Pain;Decreased knowledge of use of DME;Obesity       PT Treatment Interventions DME instruction;Therapeutic activities;Gait training;Therapeutic exercise;Patient/family education;Balance training;Functional mobility training;Neuromuscular re-education    PT Goals (Current goals can be found in the Care Plan section)  Acute Rehab PT Goals Patient Stated Goal: to get her strength back PT Goal Formulation: With patient Time For Goal Achievement: 01/18/20 Potential to Achieve Goals: Good    Frequency Min 2X/week   Barriers to discharge        Co-evaluation               AM-PAC PT "6 Clicks" Mobility  Outcome Measure Help needed turning from your back to your side while in a flat bed without using bedrails?: A Little Help needed moving from lying on your back to sitting on the side of a flat bed without using bedrails?: A Little Help needed moving to and from a bed to a chair (including a wheelchair)?: A Little Help needed standing up from a chair using your arms (e.g., wheelchair or bedside chair)?: A Little Help needed to walk in hospital room?: A Little Help needed climbing 3-5 steps with a railing? : A Little 6 Click Score: 18    End of Session Equipment Utilized During Treatment: Gait belt Activity Tolerance: Patient limited by fatigue;Treatment limited secondary  to medical complications (Comment) Patient left: in chair;with call bell/phone within reach Nurse Communication: Mobility status PT Visit Diagnosis: Other abnormalities of gait and mobility (R26.89);Muscle weakness (generalized) (M62.81)    Time: 3785-8850 PT Time Calculation (min) (ACUTE ONLY): 34 min   Charges:   PT Evaluation $PT Eval Moderate Complexity: 1 Mod PT Treatments $Therapeutic Exercise: 8-22 mins       Olga Coaster PT, DPT 12:23 PM,01/04/20

## 2020-01-04 NOTE — Progress Notes (Signed)
Shift summary:  - Patient admitted with symptomatic anemia s/t vaginal bleeding.  - Med-surg status; awaiting bed placement.

## 2020-01-04 NOTE — Progress Notes (Addendum)
Patient ID: Jasmine Osborn, female   DOB: 06/18/1951, 69 y.o.   MRN: [id number] [facility] PROGRESS NOTE  Rissie Sculley Steury PJK:[id number] DOB: [birth year] DOA: 01/03/2020 PCP: Kirk Ruths, MD  HPI/Subjective: Patient feeling a little bit better. Admitted with a hemoglobin of 4.2. Always has a little ulcer on her left first toe. Has a little shortness of breath. Chronically wears 3 L of oxygen.  Objective: Vitals:   01/04/20 1000 01/04/20 1035  BP:  122/66  Pulse:  100  Resp:  (!) 32  Temp: 99.5 F (37.5 C)   SpO2:  100%    Intake/Output Summary (Last 24 hours) at 01/04/2020 1317 Last data filed at 01/04/2020 0800 Gross per 24 hour  Intake 2117.9 ml  Output --  Net 2117.9 ml   Filed Weights   01/03/20 1432 01/03/20 2255  Weight: 113.4 kg 120.1 kg    ROS: Review of Systems  Constitutional: Negative for chills and fever.  Eyes: Negative for blurred vision.  Respiratory: Positive for shortness of breath. Negative for cough.   Cardiovascular: Negative for chest pain.  Gastrointestinal: Negative for abdominal pain, constipation, diarrhea, nausea and vomiting.  Genitourinary: Negative for dysuria.  Musculoskeletal: Positive for joint pain.  Neurological: Negative for dizziness and headaches.   Exam: Physical Exam  Constitutional: She is oriented to person, place, and time.  HENT:  Nose: No mucosal edema.  Mouth/Throat: No oropharyngeal exudate or posterior oropharyngeal edema.  Eyes: Conjunctivae and lids are normal.  Cardiovascular: S1 normal and S2 normal. Exam reveals no gallop.  No murmur heard. Respiratory: No respiratory distress. She has decreased breath sounds in the right lower field and the left lower field. She has no wheezes. She has no rhonchi. She has rales in the right lower field and the left lower field.  GI: Soft. Bowel sounds are normal. There is no abdominal tenderness.  Musculoskeletal:     Right ankle: Swelling present.     Left  ankle: Swelling present.  Lymphadenopathy:    She has no cervical adenopathy.  Neurological: She is alert and oriented to person, place, and time. No cranial nerve deficit.  Skin: Skin is warm. Nails show no clubbing.  Left toe small ulceration. Surrounding erythema left leg.  Psychiatric: She has a normal mood and affect.      Data Reviewed: Basic Metabolic Panel: Recent Labs  Lab 01/03/20 1445 01/04/20 0716  NA 140 142  K 5.1 4.8  CL 109 110  CO2 23 25  GLUCOSE 131* 97  BUN 30* 27*  CREATININE 2.09* 1.82*  CALCIUM 8.7* 8.6*   Liver Function Tests: Recent Labs  Lab 01/04/20 0716  AST 13*  ALT 12  ALKPHOS 35*  BILITOT 0.9  PROT 5.8*  ALBUMIN 2.9*  CBC: Recent Labs  Lab 01/03/20 1445 01/04/20 0049 01/04/20 0716  WBC 5.0  --  6.0  HGB 4.2* 7.0* 8.0*  HCT 17.3* 25.2* 27.6*  MCV 116.9*  --  97.9  PLT 173  --  165    CBG: Recent Labs  Lab 01/03/20 2245  GLUCAP 140*    Recent Results (from the past 240 hour(s))  Respiratory Panel by RT PCR (Flu A&B, Covid) - Nasopharyngeal Swab     Status: None   Collection Time: 01/03/20  5:26 PM   Specimen: Nasopharyngeal Swab  Result Value Ref Range Status   SARS Coronavirus 2 by RT PCR NEGATIVE NEGATIVE Final    Comment: (NOTE) SARS-CoV-2 target nucleic acids are NOT DETECTED. The  SARS-CoV-2 RNA is generally detectable in upper respiratoy specimens during the acute phase of infection. The lowest concentration of SARS-CoV-2 viral copies this assay can detect is 131 copies/mL. A negative result does not preclude SARS-Cov-2 infection and should not be used as the sole basis for treatment or other patient management decisions. A negative result may occur with  improper specimen collection/handling, submission of specimen other than nasopharyngeal swab, presence of viral mutation(s) within the areas targeted by this assay, and inadequate number of viral copies (<131 copies/mL). A negative result must be combined with  clinical observations, patient history, and epidemiological information. The expected result is Negative. Fact Sheet for Patients:  PinkCheek.be Fact Sheet for Healthcare Providers:  GravelBags.it This test is not yet ap proved or cleared by the [place] FDA and  has been authorized for detection and/or diagnosis of SARS-CoV-2 by FDA under an Emergency Use Authorization (EUA). This EUA will remain  in effect (meaning this test can be used) for the duration of the COVID-19 declaration under Section 564(b)(1) of the Act, 21 U.S.C. section 360bbb-3(b)(1), unless the authorization is terminated or revoked sooner.    Influenza A by PCR NEGATIVE NEGATIVE Final   Influenza B by PCR NEGATIVE NEGATIVE Final    Comment: (NOTE) The Xpert Xpress SARS-CoV-2/FLU/RSV assay is intended as an aid in  the diagnosis of influenza from Nasopharyngeal swab specimens and  should not be used as a sole basis for treatment. Nasal washings and  aspirates are unacceptable for Xpert Xpress SARS-CoV-2/FLU/RSV  testing. Fact Sheet for Patients: PinkCheek.be Fact Sheet for Healthcare Providers: GravelBags.it This test is not yet approved or cleared by the [place] FDA and  has been authorized for detection and/or diagnosis of SARS-CoV-2 by  FDA under an Emergency Use Authorization (EUA). This EUA will remain  in effect (meaning this test can be used) for the duration of the  Covid-19 declaration under Section 564(b)(1) of the Act, 21  U.S.C. section 360bbb-3(b)(1), unless the authorization is  terminated or revoked. Performed at [facility], [address] [place], [place] [address]   MRSA PCR Screening     Status: None   Collection Time: 01/03/20 10:54 PM   Specimen: Nasopharyngeal  Result Value Ref Range Status   MRSA by PCR NEGATIVE NEGATIVE Final    Comment:         The GeneXpert MRSA Assay (FDA approved for NASAL specimens only), is one component of a comprehensive MRSA colonization surveillance program. It is not intended to diagnose MRSA infection nor to guide or monitor treatment for MRSA infections. Performed at [facility], [address] [place], [place] [address]      Studies: DG Foot Complete Left  Result Date: 01/04/2020 CLINICAL DATA:  Ulcer along the left heel and at the toes. EXAM: LEFT FOOT - COMPLETE 3+ VIEW COMPARISON:  Left foot radiographs 01/03/2012. FINDINGS: Postoperative changes of the distal tibia are again noted. Fractured screw is stable. Prominent plantar calcaneal spur is noted. Soft tissue ulceration is evident without penetration of the bone. No acute or focal osseous abnormalities are associated. Extensive soft tissue swelling is present over the foot. Ulcerations involving the digits are not well appreciated due to overlap. No focal osseous erosion is evident. IMPRESSION: 1. Plantar ulceration at the calcaneus without acute osseous abnormality. 2. Prominent plantar spur at the calcaneus. 3. Extensive soft tissue swelling over the foot without other acute or focal osseous abnormality. Electronically Signed   By: Wynetta Fines.D.  On: 01/04/2020 09:22    Scheduled Meds: . sodium chloride   Intravenous Once  . Chlorhexidine Gluconate Cloth  6 each Topical Q0600  . collagenase   Topical Daily  . cyanocobalamin  1,000 mcg Intramuscular Daily  . fenofibrate  160 mg Oral Daily  . latanoprost  1 drop Both Eyes QHS  . losartan  50 mg Oral Daily  . metoprolol succinate  50 mg Oral Daily  . mirtazapine  30 mg Oral QHS  . multivitamin-lutein  1 capsule Oral Daily  . potassium chloride SA  20 mEq Oral Daily  . risperiDONE  1 mg Oral QHS  . [START ON 01/05/2020] torsemide  20 mg Oral Daily   Continuous Infusions: . sodium chloride 1,000 mL (01/04/20 1051)  .  ceFAZolin (ANCEF) IV    . iron sucrose 400  mg (01/04/20 1059)    Assessment/Plan:  1. Symptomatic anemia with a hemoglobin of 4.2. Patient given transfusions (3 units of packed red blood cells) and hemoglobin up to 8 today. Vaginal bleeding. Gynecology may end up doing a D&C while she is here. 2. Iron deficiency anemia give IV iron today 3. Vitamin B12 deficiency give B12 injections for 3 days 4. Acute kidney injury continue to monitor. Hold losartan 5. Essential hypertension. On metoprolol 6. Left lower extremity ulcer with surrounding cellulitis start Ancef. X-ray negative for osteomyelitis. Check an ESR. Wound care consult. 7. COPD with chronic respiratory failure on 3 L of oxygen 8. Paroxysmal atrial fibrillation hold Eliquis at this time. On metoprolol  Code Status:     Code Status Orders  (From admission, onward)         Start     Ordered   01/03/20 2138  Full code  Continuous     01/03/20 2137        Code Status History    Date Active Date Inactive Code Status Order ID Comments User Context   12/16/2017 1842 12/17/2017 2015 Full Code [id number]  Loletha Grayer, MD ED   11/04/2017 1221 11/08/2017 1905 DNR [id number]  Asencion Gowda, NP Inpatient   11/02/2017 1731 11/04/2017 1221 Full Code [id number]  Henreitta Leber, MD Inpatient   12/22/2016 1825 01/03/2017 2011 Full Code [id number]  Hermelinda Dellen DO ED   Advance Care Planning Activity     Family Communication: Spoke with husband on the phone Disposition Plan: Gynecology may be planning a D&C while the patient is here. Physical therapy recommending rehab but patient and husband would like her to go home. Will need to get stronger while here. Would like to see creatinine improved a little bit prior to disposition. Stable to get out of the stepdown unit  Consultants:  Gynecology  Antibiotics:  Ancef  Time spent: 28 minutes  [place]

## 2020-01-04 NOTE — Progress Notes (Signed)
Obstetric and Gynecology  Subjective  Jasmine Osborn is a 69 y.o. female admitted for excessive AUB . Severe anemia requiring blood transfusion . Bleeding continues , but at a slower rate . HCT 27.6    Objective   Vitals:   01/04/20 1600 01/04/20 1700  BP:    Pulse: (!) 102 100  Resp: (!) 28 (!) 34  Temp:    SpO2: 95% 99%     Intake/Output Summary (Last 24 hours) at 01/04/2020 1919 Last data filed at 01/04/2020 1800 Gross per 24 hour  Intake 2503.32 ml  Output 600 ml  Net 1903.32 ml    General: NAD Cardiovascular: RRR, no murmurs Pulmonary: CTAB Abdomen: Benign. Non-tender, +BS, no guarding. Extremities: No erythema or cords, no calf tenderness, +warmth with normal peripheral pulses.  Labs: Results for orders placed or performed during the hospital encounter of 01/03/20 (from the past 24 hour(s))  Glucose, capillary     Status: Abnormal   Collection Time: 01/03/20 10:45 PM  Result Value Ref Range   Glucose-Capillary 140 (H) 70 - 99 mg/dL  MRSA PCR Screening     Status: None   Collection Time: 01/03/20 10:54 PM   Specimen: Nasopharyngeal  Result Value Ref Range   MRSA by PCR NEGATIVE NEGATIVE  Hemoglobin and hematocrit, blood     Status: Abnormal   Collection Time: 01/04/20 12:49 AM  Result Value Ref Range   Hemoglobin 7.0 (L) 12.0 - 15.0 g/dL   HCT 25.2 (L) 36.0 - 46.0 %  Prepare RBC (crossmatch)     Status: None   Collection Time: 01/04/20  2:18 AM  Result Value Ref Range   Order Confirmation      ORDER PROCESSED BY BLOOD BANK Performed at [facility], [address] [place], [place] [address]   CBC     Status: Abnormal   Collection Time: 01/04/20  7:16 AM  Result Value Ref Range   WBC 6.0 4.0 - 10.5 K/uL   RBC 2.82 (L) 3.87 - 5.11 MIL/uL   Hemoglobin 8.0 (L) 12.0 - 15.0 g/dL   HCT 27.6 (L) 36.0 - 46.0 %   MCV 97.9 80.0 - 100.0 fL   MCH 28.4 26.0 - 34.0 pg   MCHC 29.0 (L) 30.0 - 36.0 g/dL   RDW 23.8 (H) 11.5 - 15.5 %   Platelets 165  150 - 400 K/uL   nRBC 0.5 (H) 0.0 - 0.2 %  Comprehensive metabolic panel     Status: Abnormal   Collection Time: 01/04/20  7:16 AM  Result Value Ref Range   Sodium 142 135 - 145 mmol/L   Potassium 4.8 3.5 - 5.1 mmol/L   Chloride 110 98 - 111 mmol/L   CO2 25 22 - 32 mmol/L   Glucose, Bld 97 70 - 99 mg/dL   BUN 27 (H) 8 - 23 mg/dL   Creatinine, Ser 1.82 (H) 0.44 - 1.00 mg/dL   Calcium 8.6 (L) 8.9 - 10.3 mg/dL   Total Protein 5.8 (L) 6.5 - 8.1 g/dL   Albumin 2.9 (L) 3.5 - 5.0 g/dL   AST 13 (L) 15 - 41 U/L   ALT 12 0 - 44 U/L   Alkaline Phosphatase 35 (L) 38 - 126 U/L   Total Bilirubin 0.9 0.3 - 1.2 mg/dL   GFR calc non Af Amer 28 (L) >60 mL/min   GFR calc Af Amer 32 (L) >60 mL/min   Anion gap 7 5 - 15  HIV Antibody (routine testing w rflx)  Status: None   Collection Time: 01/04/20  7:16 AM  Result Value Ref Range   HIV Screen 4th Generation wRfx NON REACTIVE NON REACTIVE  Ferritin     Status: None   Collection Time: 01/04/20  7:16 AM  Result Value Ref Range   Ferritin 31 11 - 307 ng/mL  Vitamin B12     Status: None   Collection Time: 01/04/20  7:16 AM  Result Value Ref Range   Vitamin B-12 189 180 - 914 pg/mL  Iron and TIBC     Status: None   Collection Time: 01/04/20  7:16 AM  Result Value Ref Range   Iron 92 28 - 170 ug/dL   TIBC 423 250 - 450 ug/dL   Saturation Ratios 22 10.4 - 31.8 %   UIBC 331 ug/dL  ESR     Status: Abnormal   Collection Time: 01/04/20  1:40 PM  Result Value Ref Range   Sed Rate 58 (H) 0 - 30 mm/hr    Cultures: Results for orders placed or performed during the hospital encounter of 01/03/20  Respiratory Panel by RT PCR (Flu A&B, Covid) - Nasopharyngeal Swab     Status: None   Collection Time: 01/03/20  5:26 PM   Specimen: Nasopharyngeal Swab  Result Value Ref Range Status   SARS Coronavirus 2 by RT PCR NEGATIVE NEGATIVE Final    Comment: (NOTE) SARS-CoV-2 target nucleic acids are NOT DETECTED. The SARS-CoV-2 RNA is generally detectable in  upper respiratoy specimens during the acute phase of infection. The lowest concentration of SARS-CoV-2 viral copies this assay can detect is 131 copies/mL. A negative result does not preclude SARS-Cov-2 infection and should not be used as the sole basis for treatment or other patient management decisions. A negative result may occur with  improper specimen collection/handling, submission of specimen other than nasopharyngeal swab, presence of viral mutation(s) within the areas targeted by this assay, and inadequate number of viral copies (<131 copies/mL). A negative result must be combined with clinical observations, patient history, and epidemiological information. The expected result is Negative. Fact Sheet for Patients:  PinkCheek.be Fact Sheet for Healthcare Providers:  GravelBags.it This test is not yet ap proved or cleared by the [place] FDA and  has been authorized for detection and/or diagnosis of SARS-CoV-2 by FDA under an Emergency Use Authorization (EUA). This EUA will remain  in effect (meaning this test can be used) for the duration of the COVID-19 declaration under Section 564(b)(1) of the Act, 21 U.S.C. section 360bbb-3(b)(1), unless the authorization is terminated or revoked sooner.    Influenza A by PCR NEGATIVE NEGATIVE Final   Influenza B by PCR NEGATIVE NEGATIVE Final    Comment: (NOTE) The Xpert Xpress SARS-CoV-2/FLU/RSV assay is intended as an aid in  the diagnosis of influenza from Nasopharyngeal swab specimens and  should not be used as a sole basis for treatment. Nasal washings and  aspirates are unacceptable for Xpert Xpress SARS-CoV-2/FLU/RSV  testing. Fact Sheet for Patients: PinkCheek.be Fact Sheet for Healthcare Providers: GravelBags.it This test is not yet approved or cleared by the [place] FDA and  has been authorized for  detection and/or diagnosis of SARS-CoV-2 by  FDA under an Emergency Use Authorization (EUA). This EUA will remain  in effect (meaning this test can be used) for the duration of the  Covid-19 declaration under Section 564(b)(1) of the Act, 21  U.S.C. section 360bbb-3(b)(1), unless the authorization is  terminated or revoked. Performed at [facility], [phone] [place], [place] [address]   MRSA PCR Screening     Status: None   Collection Time: 01/03/20 10:54 PM   Specimen: Nasopharyngeal  Result Value Ref Range Status   MRSA by PCR NEGATIVE NEGATIVE Final    Comment:        The GeneXpert MRSA Assay (FDA approved for NASAL specimens only), is one component of a comprehensive MRSA colonization surveillance program. It is not intended to diagnose MRSA infection nor to guide or monitor treatment for MRSA infections. Performed at [facility], [address] [place], [place] [address]     Imaging: US PELVIS ([place])  Result Date: 01/04/2020 CLINICAL DATA:  Menorrhagia.  Severe anemia. EXAM: TRANSABDOMINAL ULTRASOUND OF PELVIS TECHNIQUE: Transabdominal ultrasound examination of the pelvis was performed including evaluation of the uterus, ovaries, adnexal regions, and pelvic cul-de-sac. COMPARISON:  Pelvic MRI 05/08/2017. CT abdomen and pelvis 07/14/2017. Pelvic ultrasound 07/27/2010. FINDINGS: Examination is limited by patient body habitus. A transvaginal examination could not be performed. Uterus Measurements: 14.9 x 8.3 x 9.1 cm = volume: 589 mL. Chronically enlarged, heterogeneous uterus with clustered nodular echogenic foci near the uterine fundus. Endometrium Thickness: 9 mm.  No focal abnormality visualized. Right ovary Not visualized. Left ovary Not visualized. Other findings:  No abnormal free fluid. IMPRESSION: 1. Limited transabdominal pelvic ultrasound. 2. Chronically enlarged and heterogeneous uterus with diffuse adenomyosis diagnosed on  prior MRI. 3. 9 mm endometrial thickness, less than on the 2018 MRI but still abnormal for age. In the setting of post-menopausal bleeding, endometrial sampling is indicated to exclude carcinoma. If results are benign, sonohysterogram should be considered for focal lesion work-up. (Ref: Radiological Reasoning: Algorithmic Workup of Abnormal Vaginal Bleeding with Endovaginal Sonography and Sonohysterography. AJR 2008; 599:J57-01) 4. Nonvisualization of the ovaries. Electronically Signed   By: Logan Bores M.D.   On: 01/04/2020 18:31   DG Foot Complete Left  Result Date: 01/04/2020 CLINICAL DATA:  Ulcer along the left heel and at the toes. EXAM: LEFT FOOT - COMPLETE 3+ VIEW COMPARISON:  Left foot radiographs 01/03/2012. FINDINGS: Postoperative changes of the distal tibia are again noted. Fractured screw is stable. Prominent plantar calcaneal spur is noted. Soft tissue ulceration is evident without penetration of the bone. No acute or focal osseous abnormalities are associated. Extensive soft tissue swelling is present over the foot. Ulcerations involving the digits are not well appreciated due to overlap. No focal osseous erosion is evident. IMPRESSION: 1. Plantar ulceration at the calcaneus without acute osseous abnormality. 2. Prominent plantar spur at the calcaneus. 3. Extensive soft tissue swelling over the foot without other acute or focal osseous abnormality. Electronically Signed   By: San Morelle M.D.   On: 01/04/2020 09:22     Assessment   69 y.o. No obstetric history on file. Hospital Day: 2  Menorrhagia  With thickened endometrial stripe  And continued bleeding . Poor definitive surgical candidate ( hysterectomy ) . R/o endometrial cancer   Plan   1. On call tomorrow for D+C , hysteroscopy , and endometrial ablation  2. Start [employer] .NPO at 2400 3 NP Randol Kern on call tonight to help manage meds preop

## 2020-01-04 NOTE — Consult Note (Addendum)
WOC Nurse Consult Note: Reason for Consult: Consult requested for left foot.  Pt has chronic edema Wound type: Left anterior foot with full thickness wound; .8X.8X.1cm, 100% yellow and moist Left plantar posterior foot with chronic dry callous; 3X3cm dark brown, no odor, drainage, or fluctuance Left heel with stage 2 pressure injury; .5X1X.1cm, pink and moist Pressure Injury POA: Yes Dressing procedure/placement/frequency: Topical treatment orders provided for bedside nurses to perform as follows: Apply Santyl to left anterior foot Q day; then cover with moist 2X2 and foam dressing.  (Change foam dressing Q 3 days or PRN soiling.) Foam dressing to left posterior heel; change Q 3 days or PRN soiling.  Leave callous to left plantar foot dry and open to air. Please re-consult if further assistance is needed.  Thank-you,  Cammie Mcgee MSN, RN, CWOCN, [place], CNS [phone]

## 2020-01-04 NOTE — TOC Initial Note (Addendum)
Transition of Care [facility]) - Initial/Assessment Note    Patient Details  Name: Jasmine Osborn MRN: [id number] Date of Birth: May 06, 1951  Transition of Care [facility]) CM/SW Contact:    Magnus Ivan, LCSW Phone Number: 01/04/2020, 2:48 PM  Clinical Narrative:                CSW met with patient at bedside to discuss SNF recommendation from PT. Patient reported she is agreeable to going to a SNF for rehab at discharge. Patient reported she has been to [facility] in the past and would prefer to go there if they have availability. CSW is sending information to facilities to check availability. CSW will continue to follow.  3:10- Called [facility] help line, patient is listed in their system as "Markus Jarvis." CSW submitted PASRR screening. Sending FL2 to SNFs.   Expected Discharge Plan: Skilled Nursing Facility Barriers to Discharge: Continued Medical Work up   Patient Goals and CMS Choice   CMS Medicare.gov Compare Post Acute Care list provided to:: Patient Choice offered to / list presented to : Patient  Expected Discharge Plan and Services Expected Discharge Plan: [place]     Post Acute Care Choice: [place]                                        Prior Living Arrangements/Services     Patient language and need for interpreter reviewed:: Yes              Criminal Activity/Legal Involvement Pertinent to Current Situation/Hospitalization: No - Comment as needed  Activities of Daily Living Home Assistive Devices/Equipment: Cane (specify quad or straight) ADL Screening (condition at time of admission) Patient's cognitive ability adequate to safely complete daily activities?: No Is the patient deaf or have difficulty hearing?: No Does the patient have difficulty seeing, even when wearing glasses/contacts?: No Does the patient have difficulty concentrating, remembering, or making decisions?: No Patient able to express  need for assistance with ADLs?: Yes Does the patient have difficulty dressing or bathing?: No Independently performs ADLs?: Yes (appropriate for developmental age) Does the patient have difficulty walking or climbing stairs?: Yes Weakness of Legs: Both Weakness of Arms/Hands: None  Permission Sought/Granted Permission sought to share information with : Investment banker, corporate granted to share info w AGENCY: SNFs        Emotional Assessment Appearance:: Appears stated age Attitude/Demeanor/Rapport: Engaged Affect (typically observed): Appropriate, Calm Orientation: : Oriented to Self, Oriented to Place, Oriented to  Time, Oriented to Situation Alcohol / Substance Use: Not Applicable Psych Involvement: No (comment)  Admission diagnosis:  Vaginal bleeding [N93.9] Symptomatic anemia [D64.9] Patient Active Problem List   Diagnosis Date Noted  . Cellulitis of left lower extremity   . Skin ulcer of toe of left foot, limited to breakdown of skin ([place])   . AF (paroxysmal atrial fibrillation) ([place])   . Symptomatic anemia 01/03/2020  . Vaginal bleeding 01/03/2020  . ARF (acute renal failure) ([place]) 01/03/2020  . Anemia 12/16/2017  . Persistent atrial fibrillation ([place]) 11/05/2017  . Morbid obesity ([place]) 11/05/2017  . Acute on chronic respiratory failure with hypoxia and hypercapnia ([place]) 11/02/2017  . Increased endometrial stripe thickness 06/04/2017  . Pressure injury of skin 12/23/2016  . Respiratory failure with hypercapnia ([place]) 12/22/2016   PCP:  Kirk Ruths, MD Pharmacy:  [id number]: [phone]: [phone]    Social Determinants of Health (SDOH) Interventions    Readmission Risk Interventions No flowsheet data found.

## 2020-01-04 NOTE — Plan of Care (Signed)

## 2020-01-05 ENCOUNTER — Inpatient Hospital Stay: Payer: Medicare HMO | Admitting: Anesthesiology

## 2020-01-05 ENCOUNTER — Inpatient Hospital Stay
Admit: 2020-01-05 | Discharge: 2020-01-05 | Disposition: A | Discharge status: Home / Self Care | Payer: Medicare HMO | Attending: Internal Medicine | Admitting: Internal Medicine

## 2020-01-05 ENCOUNTER — Inpatient Hospital Stay: Admit: 2020-01-05 | Payer: Medicare HMO

## 2020-01-05 ENCOUNTER — Encounter
Admission: EM | Disposition: A | Discharge status: Skilled Nursing Facility | Payer: Self-pay | Source: Home / Self Care | Attending: Internal Medicine

## 2020-01-05 DIAGNOSIS — I4819 Other persistent atrial fibrillation: Secondary | ICD-10-CM

## 2020-01-05 HISTORY — PX: DILITATION & CURRETTAGE/HYSTROSCOPY WITH NOVASURE ABLATION: SHX5568

## 2020-01-05 LAB — TYPE AND SCREEN
ABO/RH(D): O POS
Antibody Screen: NEGATIVE
Unit division: 0
Unit division: 0
Unit division: 0

## 2020-01-05 LAB — ECHOCARDIOGRAM COMPLETE
Height: 64 in
Weight: 4070.57 oz

## 2020-01-05 LAB — BASIC METABOLIC PANEL
Anion gap: 6 (ref 5–15)
BUN: 19 mg/dL (ref 8–23)
CO2: 29 mmol/L (ref 22–32)
Calcium: 8.5 mg/dL — ABNORMAL LOW (ref 8.9–10.3)
Chloride: 110 mmol/L (ref 98–111)
Creatinine, Ser: 1.58 mg/dL — ABNORMAL HIGH (ref 0.44–1.00)
GFR calc Af Amer: 38 mL/min — ABNORMAL LOW (ref >60)
GFR calc non Af Amer: 33 mL/min — ABNORMAL LOW (ref >60)
Glucose, Bld: 101 mg/dL — ABNORMAL HIGH (ref 70–99)
Potassium: 4.6 mmol/L (ref 3.5–5.1)
Sodium: 145 mmol/L (ref 135–145)

## 2020-01-05 LAB — BPAM RBC
Blood Product Expiration Date: 202104272359
Blood Product Expiration Date: 202104272359
Blood Product Expiration Date: 202104272359
ISSUE DATE / TIME: 202103291804
ISSUE DATE / TIME: 202103292119
ISSUE DATE / TIME: 202103300304
Unit Type and Rh: 5100
Unit Type and Rh: 5100
Unit Type and Rh: 5100

## 2020-01-05 LAB — BLOOD GAS, ARTERIAL
Acid-base deficit: 1 mmol/L (ref 0.0–2.0)
Bicarbonate: 26.8 mmol/L (ref 20.0–28.0)
Delivery systems: POSITIVE
Expiratory PAP: 5
FIO2: 0.4
Inspiratory PAP: 20
O2 Saturation: 90.7 %
Patient temperature: 37
pCO2 arterial: 57 mmHg — ABNORMAL HIGH (ref 32.0–48.0)
pH, Arterial: 7.28 — ABNORMAL LOW (ref 7.350–7.450)
pO2, Arterial: 68 mmHg — ABNORMAL LOW (ref 83.0–108.0)

## 2020-01-05 LAB — CBC
HCT: 28.5 % — ABNORMAL LOW (ref 36.0–46.0)
Hemoglobin: 8.2 g/dL — ABNORMAL LOW (ref 12.0–15.0)
MCH: 28.5 pg (ref 26.0–34.0)
MCHC: 28.8 g/dL — ABNORMAL LOW (ref 30.0–36.0)
MCV: 99 fL (ref 80.0–100.0)
Platelets: 161 10*3/uL (ref 150–400)
RBC: 2.88 MIL/uL — ABNORMAL LOW (ref 3.87–5.11)
RDW: 23.1 % — ABNORMAL HIGH (ref 11.5–15.5)
WBC: 6.3 10*3/uL (ref 4.0–10.5)
nRBC: 0.8 % — ABNORMAL HIGH (ref 0.0–0.2)

## 2020-01-05 LAB — MAGNESIUM: Magnesium: 2 mg/dL (ref 1.7–2.4)

## 2020-01-05 LAB — BRAIN NATRIURETIC PEPTIDE: B Natriuretic Peptide: 559 pg/mL — ABNORMAL HIGH (ref 0.0–100.0)

## 2020-01-05 LAB — FIBRIN DERIVATIVES D-DIMER (ARMC ONLY): Fibrin derivatives D-dimer (ARMC): 706.41 ng/mL (FEU) — ABNORMAL HIGH (ref 0.00–499.00)

## 2020-01-05 SURGERY — DILATATION & CURETTAGE/HYSTEROSCOPY WITH NOVASURE ABLATION
Anesthesia: General | Site: Uterus

## 2020-01-05 MED ORDER — DEXAMETHASONE SODIUM PHOSPHATE 10 MG/ML IJ SOLN
INTRAMUSCULAR | Status: DC | PRN
  Administered 2020-01-05: 10 mg via INTRAVENOUS

## 2020-01-05 MED ORDER — SUCCINYLCHOLINE CHLORIDE 20 MG/ML IJ SOLN
INTRAMUSCULAR | Status: DC | PRN
  Administered 2020-01-05: 120 mg via INTRAVENOUS

## 2020-01-05 MED ORDER — ONDANSETRON HCL 4 MG/2ML IJ SOLN
INTRAMUSCULAR | Status: DC | PRN
  Administered 2020-01-05: 4 mg via INTRAVENOUS

## 2020-01-05 MED ORDER — PHENYLEPHRINE HCL (PRESSORS) 10 MG/ML IV SOLN
INTRAVENOUS | Status: DC | PRN
  Administered 2020-01-05 (×3): 100 ug via INTRAVENOUS

## 2020-01-05 MED ORDER — LACTATED RINGERS IV SOLN: INTRAVENOUS | Status: DC | PRN

## 2020-01-05 MED ORDER — LEVONORGESTREL 19.5 MCG/DAY IU IUD
INTRAUTERINE_SYSTEM | INTRAUTERINE | Status: AC
  Filled 2020-01-05: qty 1

## 2020-01-05 MED ORDER — 0.9 % SODIUM CHLORIDE (POUR BTL) OPTIME
TOPICAL | Status: DC | PRN
  Administered 2020-01-05: 14:00:00 1000 mL

## 2020-01-05 MED ORDER — LACTATED RINGERS IR SOLN
Status: DC | PRN
  Administered 2020-01-05: 3000 mL

## 2020-01-05 MED ORDER — METOPROLOL TARTRATE 5 MG/5ML IV SOLN
INTRAVENOUS | Status: DC | PRN
  Administered 2020-01-05 (×2): 1 mg via INTRAVENOUS

## 2020-01-05 MED ORDER — EPHEDRINE SULFATE 50 MG/ML IJ SOLN
INTRAMUSCULAR | Status: DC | PRN
  Administered 2020-01-05: 10 mg via INTRAVENOUS

## 2020-01-05 MED ORDER — SODIUM CHLORIDE 0.9 % IV SOLN
INTRAVENOUS | Status: DC | PRN
  Administered 2020-01-05: 25 ug/min via INTRAVENOUS

## 2020-01-05 MED ORDER — PROPOFOL 10 MG/ML IV BOLUS
INTRAVENOUS | Status: DC | PRN
  Administered 2020-01-05: 120 mg via INTRAVENOUS

## 2020-01-05 MED ORDER — CEFAZOLIN SODIUM-DEXTROSE 2-3 GM-%(50ML) IV SOLR
INTRAVENOUS | Status: DC | PRN
  Administered 2020-01-05: 2 g via INTRAVENOUS

## 2020-01-05 MED ORDER — SODIUM CHLORIDE 0.9 % IV SOLN: INTRAVENOUS | Status: DC | PRN

## 2020-01-05 MED ORDER — VASOPRESSIN 20 UNIT/ML IV SOLN
INTRAVENOUS | Status: DC | PRN
  Administered 2020-01-05 (×3): 2 [IU] via INTRAVENOUS

## 2020-01-05 MED ORDER — PERFLUTREN LIPID MICROSPHERE
1.0000 mL | INTRAVENOUS | Status: AC | PRN
  Administered 2020-01-05: 2 mL via INTRAVENOUS
  Filled 2020-01-05: qty 10

## 2020-01-05 SURGICAL SUPPLY — 19 items
ABLATOR SURESOUND NOVASURE (ABLATOR) ×5 IMPLANT
CANISTER SUCT 1200ML W/VALVE (MISCELLANEOUS) ×3 IMPLANT
CATH ROBINSON RED A/P 16FR (CATHETERS) ×3 IMPLANT
COVER WAND RF STERILE (DRAPES) ×3 IMPLANT
DRSG TEGADERM 4X10 (GAUZE/BANDAGES/DRESSINGS) ×2 IMPLANT
GLOVE BIO SURGEON STRL SZ7 (GLOVE) ×3 IMPLANT
GLOVE INDICATOR 7.5 STRL GRN (GLOVE) ×3 IMPLANT
GOWN STRL REUS W/ TWL LRG LVL3 (GOWN DISPOSABLE) ×2 IMPLANT
GOWN STRL REUS W/TWL LRG LVL3 (GOWN DISPOSABLE) ×4
IV LACTATED RINGERS 1000ML (IV SOLUTION) ×3 IMPLANT
KIT TURNOVER CYSTO (KITS) ×3 IMPLANT
Liletta intauterine system ×2 IMPLANT
NS IRRIG 500ML POUR BTL (IV SOLUTION) ×3 IMPLANT
PACK DNC HYST (MISCELLANEOUS) ×3 IMPLANT
PAD OB MATERNITY 4.3X12.25 (PERSONAL CARE ITEMS) ×3 IMPLANT
PAD PREP 24X41 OB/GYN DISP (PERSONAL CARE ITEMS) ×3 IMPLANT
TOWEL OR 17X26 4PK STRL BLUE (TOWEL DISPOSABLE) ×3 IMPLANT
TUBING CONNECTING 10 (TUBING) ×2 IMPLANT
TUBING CONNECTING 10' (TUBING) ×1

## 2020-01-05 NOTE — Op Note (Addendum)
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  Procedure: 1. Exam under anesthesia 2.  D&C 3. Hysteroscopy 4. Attempted Novasure ablation, failed 5. Placement of Liletta progesterone IUD to control bleeding 6. Pap smear  Co-Surgeon: Benjaman Kindler, MD; Laverta Baltimore, MD  Assistant(s):  Barton Fanny, CST Student: PA-S Lerry Paterson  Anesthesia: General endotracheal anesthesia  Anesthesiologist: No responsible provider has been recorded for the case. Anesthesiologist: Emmie Niemann, MD CRNA: Kelton Pillar, CRNA  Estimated Blood Loss:  Minimal, though significant clots in vaginal vault             Total IV Fluids: see anesthesia  Urine Output: Foley cath in place on arrival to the OR from the ICU  Total Fluid Deficit:  n/a          Specimens: Endometrial curettings. Pap smear collected and taken to outpatient clinic lab for processing         Complications:   - none         Disposition: ICU - extubated and stable.         Condition: stable  Findings: Normal vaginal canal with large clots Uterus measuring 9 cm by sound; width <2.5cm in endometrial cavity. Proliferative endometrial tissue. Tubal ostia visualized.   Right medial thigh laceration noted prior to procedure, that appears to be skin breakdown from pressure from the foley cath.  She also has skin breakdown at the most dependent part of her pannus.  Indication for procedure/Consents: 69 y.o.  F presenting to the operating room from the ICU, where she has been managed after transfusion for her multiple comorbidities.  She has been experiencing postmenopausal bleeding, with a particularly heavy episode at this time and severe anemia.   Her husband tells me that she bleeds only about once a year over the last several years, not daily.  We have previously tried to manage her bleeding and her hyperplasia with a progesterone only IUD, with insensible loss of that IUD.  She is also on anticoagulation.  After consideration and discussion with the patient and her husband, we decided to proceed to the operating room for an endometrial ablation, to attempt control of catastrophic bleeding and possible sequelae such as she had during this episode.  We also proceeded to the operating room to get comprehensive endometrial sampling.  Risks of surgery were discussed with the patient including but not limited to: bleeding which may require further transfusion; infection which may require additional antibiotics; injury to uterus or surrounding organs; need for additional procedures including laparotomy or laparoscopy; and other postoperative/anesthesia complications. Written informed consent was obtained.    Procedure Details:   D&C/Polypectomy The patient was taken to the operating room where anesthesia was administered and was found to be adequate. After a formal and adequate timeout was performed, she was placed in the dorsal lithotomy position and examined with the above findings. She was then prepped and draped in the sterile manner using Hibiclens. Her bladder was already catheterized with clear, yellow urine.   Using a bivalve speculum, a Deaver, pelvic sidewalls, and excellent assistance, the cervix was visualized.  The anterior and posterior lips of the cervix were grasped with 2 single-tooth tenaculums, to allow for visualization and safe dilation.  Her cervix was serially dilated to 15 [place] using Hanks  dilators.  Her uterus was sounded to 9 cm. The hysteroscope was introduced to reveal the above findings.   A sharp curettage was then performed until there was a gritty texture in all four quadrants.   The NovaSure device was then  introduced into the endometrial cavity.  It was deployed, but the width of the cavity did not reach 2.5 cm.  The device was adjusted and replaced without success.  This was attempted several times, and then this portion of the procedure was abandoned.  At this point we decided to proceed with placement of a progesterone IUD, in the hopes that future hyperplasia will be discouraged and possible future bleeding episodes may be minimal.  The strings were cut to almost the length of the vagina for future ease of finding.  This procedure was discussed with her husband, who agreed.  A Pap smear was collected from the external cervical os.  The tenaculum was removed from the anterior lip of the cervix and the vaginal speculum was removed and good hemostasis assured with pressure.   The patient tolerated the procedure well, she was able to be extubated prior to leaving the operating room, and returned to the ICU.

## 2020-01-05 NOTE — Progress Notes (Signed)
PROGRESS NOTE    Jasmine Osborn  TIR:[id number] DOB: November 30, 1950 DOA: 01/03/2020 PCP: Kirk Ruths, MD    Brief Narrative:  Jasmine Osborn is a 69 y.o. female with medical history significant of chronic atrial fibrillation on anticoagulation, chronic respiratory failure due to COPD, diabetes, morbid obesity, depression, hypertension, peripheral vascular disease, chronic stasis edema who came to the ER secondary to excessive vaginal bleed.      Consultants:   GYN  Procedures: transfusion  Antimicrobials:   cefazolin   Subjective: Saw pt this am. Denies any complaints. States feeling a little better. Early this am with nsvt.cxr was obtained and revealed CHF.  Lasix 80 mg IV x1 was given last night  Objective: Vitals:   01/05/20 0400 01/05/20 0500 01/05/20 0600 01/05/20 1000  BP: (!) 161/54 (!) 172/45 (!) 137/44   Pulse: 100 (!) 101 95   Resp: (!) 23 (!) 24 (!) 21 (!) 22  Temp:      TempSrc:      SpO2: 95% 97% 98%   Weight:  115.4 kg    Height:        Intake/Output Summary (Last 24 hours) at 01/05/2020 1516 Last data filed at 01/05/2020 1418 Gross per 24 hour  Intake 658.18 ml  Output 4300 ml  Net -3641.82 ml   Filed Weights   01/03/20 1432 01/03/20 2255 01/05/20 0500  Weight: 113.4 kg 120.1 kg 115.4 kg    Examination:  General exam: Appears calm and comfortable  Respiratory system: Clear to auscultation. Respiratory effort normal. Cardiovascular system: S1 & S2 heard, RRR. No murmurs, rubs, gallops or clicks.  Gastrointestinal system: Abdomen is nondistended, soft and nontender.. Normal bowel sounds heard. Central nervous system: Awakens. Grossly intact  Extremities: mild b/l edema. +b/l erythema.  Skin: No rashes, lesions or ulcers Psychiatry: Mood & affect appropriate in current setting.     Data Reviewed: I have personally reviewed following labs and imaging studies  CBC: Recent Labs  Lab 01/03/20 1445 01/04/20 0049 01/04/20 0716  01/04/20 2337 01/05/20 0620  WBC 5.0  --  6.0 6.9 6.3  HGB 4.2* 7.0* 8.0* 8.3* 8.2*  HCT 17.3* 25.2* 27.6* 29.4* 28.5*  MCV 116.9*  --  97.9 99.7 99.0  PLT 173  --  165 173 676   Basic Metabolic Panel: Recent Labs  Lab 01/03/20 1445 01/04/20 0716 01/05/20 0620  NA 140 142 145  K 5.1 4.8 4.6  CL 109 110 110  CO2 _0 GLUCOSE 131* 97 101*  BUN 30* 27* 19  CREATININE 2.09* 1.82* 1.58*  CALCIUM 8.7* 8.6* 8.5*  MG  --   --  2.0   GFR: Estimated Creatinine Clearance: 41.9 mL/min (A) (by C-G formula based on SCr of 1.58 mg/dL (H)). Liver Function Tests: Recent Labs  Lab 01/04/20 0716  AST 13*  ALT 12  ALKPHOS 35*  BILITOT 0.9  PROT 5.8*  ALBUMIN 2.9*   No results for input(s): LIPASE, AMYLASE in the last 168 hours. No results for input(s): AMMONIA in the last 168 hours. Coagulation Profile: Recent Labs  Lab 01/03/20 1448  INR 1.8*   Cardiac Enzymes: No results for input(s): CKTOTAL, CKMB, CKMBINDEX, TROPONINI in the last 168 hours. BNP (last 3 results) No results for input(s): PROBNP in the last 8760 hours. HbA1C: No results for input(s): HGBA1C in the last 72 hours. CBG: Recent Labs  Lab 01/03/20 2245  GLUCAP 140*   Lipid Profile: No results for input(s): CHOL, HDL, LDLCALC, TRIG,  CHOLHDL, LDLDIRECT in the last 72 hours. Thyroid Function Tests: No results for input(s): TSH, T4TOTAL, FREET4, T3FREE, THYROIDAB in the last 72 hours. Anemia Panel: Recent Labs    01/04/20 0716  VITAMINB12 189  FERRITIN 31  TIBC 423  IRON 92   Sepsis Labs: No results for input(s): PROCALCITON, LATICACIDVEN in the last 168 hours.  Recent Results (from the past 240 hour(s))  Respiratory Panel by RT PCR (Flu A&B, Covid) - Nasopharyngeal Swab     Status: None   Collection Time: 01/03/20  5:26 PM   Specimen: Nasopharyngeal Swab  Result Value Ref Range Status   SARS Coronavirus 2 by RT PCR NEGATIVE NEGATIVE Final    Comment: (NOTE) SARS-CoV-2 target nucleic acids  are NOT DETECTED. The SARS-CoV-2 RNA is generally detectable in upper respiratoy specimens during the acute phase of infection. The lowest concentration of SARS-CoV-2 viral copies this assay can detect is 131 copies/mL. A negative result does not preclude SARS-Cov-2 infection and should not be used as the sole basis for treatment or other patient management decisions. A negative result may occur with  improper specimen collection/handling, submission of specimen other than nasopharyngeal swab, presence of viral mutation(s) within the areas targeted by this assay, and inadequate number of viral copies (<131 copies/mL). A negative result must be combined with clinical observations, patient history, and epidemiological information. The expected result is Negative. Fact Sheet for Patients:  PinkCheek.be Fact Sheet for Healthcare Providers:  GravelBags.it This test is not yet ap proved or cleared by the [place] FDA and  has been authorized for detection and/or diagnosis of SARS-CoV-2 by FDA under an Emergency Use Authorization (EUA). This EUA will remain  in effect (meaning this test can be used) for the duration of the COVID-19 declaration under Section 564(b)(1) of the Act, 21 U.S.C. section 360bbb-3(b)(1), unless the authorization is terminated or revoked sooner.    Influenza A by PCR NEGATIVE NEGATIVE Final   Influenza B by PCR NEGATIVE NEGATIVE Final    Comment: (NOTE) The Xpert Xpress SARS-CoV-2/FLU/RSV assay is intended as an aid in  the diagnosis of influenza from Nasopharyngeal swab specimens and  should not be used as a sole basis for treatment. Nasal washings and  aspirates are unacceptable for Xpert Xpress SARS-CoV-2/FLU/RSV  testing. Fact Sheet for Patients: PinkCheek.be Fact Sheet for Healthcare Providers: GravelBags.it This test is not yet approved or  cleared by the [place] FDA and  has been authorized for detection and/or diagnosis of SARS-CoV-2 by  FDA under an Emergency Use Authorization (EUA). This EUA will remain  in effect (meaning this test can be used) for the duration of the  Covid-19 declaration under Section 564(b)(1) of the Act, 21  U.S.C. section 360bbb-3(b)(1), unless the authorization is  terminated or revoked. Performed at [facility], [address] [place], [place] [address]   MRSA PCR Screening     Status: None   Collection Time: 01/03/20 10:54 PM   Specimen: Nasopharyngeal  Result Value Ref Range Status   MRSA by PCR NEGATIVE NEGATIVE Final    Comment:        The GeneXpert MRSA Assay (FDA approved for NASAL specimens only), is one component of a comprehensive MRSA colonization surveillance program. It is not intended to diagnose MRSA infection nor to guide or monitor treatment for MRSA infections. Performed at [facility], [address] [place], [place] [address]          Radiology Studies: US PELVIS ([place])  Result Date: 01/04/2020 CLINICAL DATA:  Menorrhagia.  Severe anemia. EXAM: TRANSABDOMINAL ULTRASOUND OF PELVIS TECHNIQUE: Transabdominal ultrasound examination of the pelvis was performed including evaluation of the uterus, ovaries, adnexal regions, and pelvic cul-de-sac. COMPARISON:  Pelvic MRI 05/08/2017. CT abdomen and pelvis 07/14/2017. Pelvic ultrasound 07/27/2010. FINDINGS: Examination is limited by patient body habitus. A transvaginal examination could not be performed. Uterus Measurements: 14.9 x 8.3 x 9.1 cm = volume: 589 mL. Chronically enlarged, heterogeneous uterus with clustered nodular echogenic foci near the uterine fundus. Endometrium Thickness: 9 mm.  No focal abnormality visualized. Right ovary Not visualized. Left ovary Not visualized. Other findings:  No abnormal free fluid. IMPRESSION: 1. Limited transabdominal pelvic ultrasound. 2.  Chronically enlarged and heterogeneous uterus with diffuse adenomyosis diagnosed on prior MRI. 3. 9 mm endometrial thickness, less than on the 2018 MRI but still abnormal for age. In the setting of post-menopausal bleeding, endometrial sampling is indicated to exclude carcinoma. If results are benign, sonohysterogram should be considered for focal lesion work-up. (Ref: Radiological Reasoning: Algorithmic Workup of Abnormal Vaginal Bleeding with Endovaginal Sonography and Sonohysterography. AJR 2008; 469:G29-52) 4. Nonvisualization of the ovaries. Electronically Signed   By: Logan Bores M.D.   On: 01/04/2020 18:31   DG CHEST PORT 1 VIEW  Result Date: 01/04/2020 CLINICAL DATA:  Tachypnea EXAM: PORTABLE CHEST 1 VIEW COMPARISON:  11/04/2017 FINDINGS: Cardiac shadow is enlarged. Increased vascular congestion is noted with interstitial edema consistent with congestive failure. No focal infiltrate is seen. No acute bony abnormality is noted. Degenerative changes of the thoracic spine are seen. IMPRESSION: Changes consistent with CHF. Electronically Signed   By: Inez Catalina M.D.   On: 01/04/2020 22:22   DG Foot Complete Left  Result Date: 01/04/2020 CLINICAL DATA:  Ulcer along the left heel and at the toes. EXAM: LEFT FOOT - COMPLETE 3+ VIEW COMPARISON:  Left foot radiographs 01/03/2012. FINDINGS: Postoperative changes of the distal tibia are again noted. Fractured screw is stable. Prominent plantar calcaneal spur is noted. Soft tissue ulceration is evident without penetration of the bone. No acute or focal osseous abnormalities are associated. Extensive soft tissue swelling is present over the foot. Ulcerations involving the digits are not well appreciated due to overlap. No focal osseous erosion is evident. IMPRESSION: 1. Plantar ulceration at the calcaneus without acute osseous abnormality. 2. Prominent plantar spur at the calcaneus. 3. Extensive soft tissue swelling over the foot without other acute or focal  osseous abnormality. Electronically Signed   By: San Morelle M.D.   On: 01/04/2020 09:22        Scheduled Meds: . sodium chloride   Intravenous Once  . chlorhexidine  15 mL Mouth Rinse BID  . Chlorhexidine Gluconate Cloth  6 each Topical Q0600  . collagenase   Topical Daily  . cyanocobalamin  1,000 mcg Intramuscular Daily  . fenofibrate  160 mg Oral Daily  . latanoprost  1 drop Both Eyes QHS  . mouth rinse  15 mL Mouth Rinse q12n4p  . metoprolol succinate  50 mg Oral Daily  . mirtazapine  30 mg Oral QHS  . multivitamin-lutein  1 capsule Oral Daily  . potassium chloride SA  20 mEq Oral Daily  . risperiDONE  1 mg Oral QHS  . torsemide  20 mg Oral Daily  . tranexamic acid  1,300 mg Oral TID   Continuous Infusions: . sodium chloride 5 mL/hr at 01/05/20 0600  .  ceFAZolin (ANCEF) IV 2 g (01/05/20 8413)  .  ceFAZolin (ANCEF) IV  Assessment & Plan:   Principal Problem:   Symptomatic anemia Active Problems:   Persistent atrial fibrillation (HCC)   Morbid obesity (HCC)   Vaginal bleeding   ARF (acute renal failure) (HCC)   Cellulitis of left lower extremity   Skin ulcer of toe of left foot, limited to breakdown of skin (HCC)   AF (paroxysmal atrial fibrillation) ([place])   1. Symptomatic anemia with a hemoglobin of 4.2. Patient given transfusions (3 units of packed red blood cells) and hemoglobin up to 8 today. Vaginal bleeding. Plan for D/C today. 2. Iron deficiency anemia -given  IV iron  3. Vitamin B12 deficiency give B12 injections for 3 days 4. Acute kidney injury continue to monitor. Hold losartan for now.  5. NSVT- likely 2/2 anemia, chf. Given lasix. On beta blk. Will order echo and cardiology consult 6. Essential hypertension. On metoprolol 7. Left lower extremity ulcer with surrounding cellulitis start Ancef. X-ray negative for osteomyelitis. Check an ESR. Wound care consult. 8. COPD with chronic respiratory failure on 3 L of oxygen 9. Paroxysmal atrial  fibrillation hold Eliquis at this time. On metoprolol   DVT prophylaxis: scd, Eliquis on hold right now Code Status: Full Family Communication: None at bedside Disposition Plan: Plan for [facility] today.  Discharge depending on medical stability, to be arranged.  Patient also had nonsustained VT work-up pending.       LOS: 2 days   Time spent: 45 minutes with more than 50% on [place], MD [facility] Pager [phone]  If 7PM-7AM, please contact night-coverage [email or web address] Password [facility] 01/05/2020, 3:16 PM

## 2020-01-05 NOTE — Progress Notes (Signed)
Patietn develop several episodes non sustained v tach , asymptomatic per nurse report.  Review of vital signs shown tachycardia with stable pressures throughout day, electrolytes optimized.  Oxygen weaned down early yesterday am and her oixygen saturatons remained stable.  Bedside exam she di demonstrated tachypenia with rate around 30 and appeared short of breath however she denied. BBS diminished - difficult to appreciate secondary to environmental noise and body habitus. + fluid balance of almost 2 liters documented as of 1900 3/30./2021 Weight up approx 7 kg   -H & H stable -Chest x-ray with CHF -fibrin split derivatives ( ddimer) - no significant elevation  -Additional  lasix dose given- -Foley - strict I & O and endometrial ablation procedure scheduled for am -Incentive spirometer -keep npo -optimize electrolytes with am labs

## 2020-01-05 NOTE — TOC Progression Note (Addendum)
Transition of Care [facility]) - Progression Note    Patient Details  Name: Jasmine Osborn MRN: [id number] Osborn of Birth: 08/03/1951  [facility] CM/SW Contact  Liliana Cline, LCSW Phone Number: 01/05/2020, 8:25 AM  Clinical Narrative:   CSW called and spoke with Inetta Fermo at [facility] She reported she will review patient's referral this morning and let CSW know if she can accommodate patient. Informed Inetta Fermo we are not sure of discharge Osborn yet. She verbalized understanding. CSW will continue to follow.   8:50- Follow-up from [facility] with [facility] who reported they can accept patient at discharge and will do insurance authorization. Negative COVID 19 test results will need to be within 4 days. Updated MD.  12:00- Additional information uploaded to [place] [facility] for PASRR Screening.  Expected Discharge Plan: Skilled Nursing Facility Barriers to Discharge: Continued Medical Work up  Expected Discharge Plan and Services Expected Discharge Plan: Skilled Nursing Facility     Post Acute Care Choice: Skilled Nursing Facility                                         Social Determinants of Health (SDOH) Interventions    Readmission Risk Interventions No flowsheet data found.

## 2020-01-05 NOTE — Interval H&P Note (Signed)
History and Physical Interval Note:  01/05/2020 12:42 PM  Jasmine Osborn  has presented today for surgery, with the diagnosis of Bleeding.  The various methods of treatment have been discussed with the patient and family. After consideration of risks, benefits and other options for treatment, the patient has consented to  Procedure(s): DILATATION & CURETTAGE/HYSTEROSCOPY WITH NOVASURE ABLATION (N/A) as a surgical intervention.  The patient's history has been reviewed, patient examined, no change in status, stable for surgery.  I have reviewed the patient's chart and labs.  Questions were answered to the patient's satisfaction.    Pt with hx of serious comorbidities, managed on the hospitalist service in the ICU, admitted with hbg of 4.2. Hx of PMB with simple hyperplasia and continued bleeding on anticoagulation for hx of afib, stopped on admission. S/p 3u pRBCs with appropriate bump in H/H. Plan for uterine sampling and endometrial ablation to slow PMB.   Christeen Douglas

## 2020-01-05 NOTE — Anesthesia Preprocedure Evaluation (Addendum)
Anesthesia Evaluation  Patient identified by MRN, date of birth, ID band Patient awake    Reviewed: Allergy & Precautions, H&P , NPO status , Patient's Chart, lab work & pertinent test results, reviewed documented beta blocker date and time   History of Anesthesia Complications Negative for: history of anesthetic complications  Airway Mallampati: II  TM Distance: >3 FB Neck ROM: full    Dental  (+) Poor Dentition   Pulmonary sleep apnea (BiPAP) , former smoker,    Pulmonary exam normal        Cardiovascular Exercise Tolerance: Poor hypertension, On Medications + Peripheral Vascular Disease  (-) CAD, (-) Past MI, (-) Cardiac Stents and (-) CABG Normal cardiovascular exam+ dysrhythmias Atrial Fibrillation  Rhythm:Irregular Rate:Normal     Neuro/Psych PSYCHIATRIC DISORDERS Depression Schizophrenia Difficult to arouse and documented per  primary icu team documentation negative neurological ROS  negative psych ROS   GI/Hepatic negative GI ROS, Neg liver ROS,   Endo/Other  negative endocrine ROSdiabetes  Renal/GU Renal diseasenegative Renal ROS  negative genitourinary   Musculoskeletal   Abdominal (+) + obese,   Peds  Hematology negative hematology ROS (+) Blood dyscrasia, anemia ,   Anesthesia Other Findings Past Medical History: No date: Atrial fibrillation (HCC) No date: Chronic respiratory failure with hypoxia and hypercapnia  (HCC) No date: Depression No date: Diabetes mellitus without complication (HCC) No date: Elevated lipids No date: Hernia of abdominal wall No date: Hypertension 06/04/2017: Increased endometrial stripe thickness 06/04/2017: Increased endometrial stripe thickness No date: Obesity hypoventilation syndrome (HCC) No date: Psychosis (HCC) No date: PVD (peripheral vascular disease) (HCC)     Comment:  heel ulcer Lt  Past Surgical History: No date: COLON SURGERY No date: DILATION AND  CURETTAGE OF UTERUS No date: HERNIA REPAIR No date: TIBIA FRACTURE SURGERY; Left BMI    Body Mass Index: 43.67 kg/m     Reproductive/Obstetrics negative OB ROS                           Anesthesia Physical Anesthesia Plan  ASA: IV and emergent  Anesthesia Plan: General LMA   Post-op Pain Management:    Induction: Intravenous  PONV Risk Score and Plan: 2 and Ondansetron and Treatment may vary due to age or medical condition  Airway Management Planned: Oral ETT  Additional Equipment:   Intra-op Plan:   Post-operative Plan: Extubation in OR and Possible Post-op intubation/ventilation  Informed Consent: I have reviewed the patients History and Physical, chart, labs and discussed the procedure including the risks, benefits and alternatives for the proposed anesthesia with the patient or authorized representative who has indicated his/her understanding and acceptance.     Dental Advisory Given  Plan Discussed with: CRNA  Anesthesia Plan Comments:        Anesthesia Quick Evaluation

## 2020-01-05 NOTE — Consult Note (Signed)
Name: Jasmine Osborn MRN: [id number] DOB: Dec 21, 1950    ADMISSION DATE:  01/03/2020 CONSULTATION DATE:  01/05/2020  REFERRING MD :  Dr. Marylu Lund  CHIEF COMPLAINT:  Shortness of Breath  BRIEF PATIENT DESCRIPTION:  69 year old female admitted 3/29 with symptomatic anemia secondary to vaginal bleeding requiring Kcentra and blood transfusions.  She underwent hysteroscopy with D&C on 3/31.  Post procedure she developed acute respiratory distress requiring BiPAP.  SIGNIFICANT EVENTS  3/29: Admission to stepdown; OB/GYN consulted 3/31: Nonsustained V. Tach, CHF exacerbation 3/31: Underwent hysteroscopy with D&C 3/31: Post procedure developed respiratory distress requiring BiPAP, PCCM consult  STUDIES:  3/30: DG Left Foot>>1. Plantar ulceration at the calcaneus without acute osseous abnormality. 2. Prominent plantar spur at the calcaneus. 3. Extensive soft tissue swelling over the foot without other acute or focal osseous abnormality. 3/30: Ultrasound pelvis>>1. Limited transabdominal pelvic ultrasound. 2. Chronically enlarged and heterogeneous uterus with diffuse adenomyosis diagnosed on prior MRI. 3. 9 mm endometrial thickness, less than on the 2018 MRI but still abnormal for age. In the setting of post-menopausal bleeding, endometrial sampling is indicated to exclude carcinoma. If results are benign, sonohysterogram should be considered for focal lesion work-up. (Ref: Radiological Reasoning: Algorithmic Workup of Abnormal Vaginal Bleeding with Endovaginal Sonography and Sonohysterography. AJR 2008; 409:W11-91) 4. Nonvisualization of the ovaries. 3/30: Chest x-ray>>Cardiac shadow is enlarged. Increased vascular congestion is noted with interstitial edema consistent with congestive failure. No focal infiltrate is seen. No acute bony abnormality is noted. Degenerative changes of the thoracic spine are seen.  CULTURES: SARS-CoV-2 PCR 3/29>> negative Influenza PCR 3/29>>  negative MRSA PCR 3/29>> negative  ANTIBIOTICS: Cefazolin 3/30>>  HISTORY OF PRESENT ILLNESS:   Jasmine Osborn is a 69 y.o. Female with a past medical history significant for chronic atrial fibrillation on Eliquis, chronic respiratory failure secondary to COPD chronically on 3L O2, diabetes mellitus, morbid obesity, depression, hypertension, peripheral vascular disease, chronic stasis edema who presented to [facility] ED on 01/03/2020 due to excessive vaginal bleeding, both right bright red blood and clots.  She reported a 3-week history of vaginal bleeding with associated weakness and shortness of breath.  She was found to have significant anemia with hemoglobin of 4.2. She was given Kcentra to reverse her Eliquis and required blood transfusions (3 units of pRBC's).  She was admitted to the stepdown unit by the hospitalist for further work-up and treatment of symptomatic anemia secondary to active vaginal bleeding and acute renal failure.  OB/GYN was consulted.  On 3/31 she developed several episodes of nonsustained ventricular tachycardia, of which she was asymptomatic.  She was noted to be tachypneic with respiratory rate around 30.  Chest x-ray was obtained consistent with CHF, D-dimer with no significant elevation.  She was given additional Lasix.  Later in the day on 3/31 she underwent hysteroscopy with D&C, along with placement of Liletta IUD.  After transfer back to stepdown unit she developed acute respiratory distress requiring BiPAP.  PCCM was consulted for further management. Suspect respiratory distress was in the setting of Anesthesia vs. Questionable COPD Exacerbation.  PAST MEDICAL HISTORY :   has a past medical history of Atrial fibrillation (HCC), Chronic respiratory failure with hypoxia and hypercapnia (HCC), Depression, Diabetes mellitus without complication (HCC), Elevated lipids, Hernia of abdominal wall, Hypertension, Increased endometrial stripe thickness (06/04/2017), Increased  endometrial stripe thickness (06/04/2017), Obesity hypoventilation syndrome (HCC), Psychosis (HCC), and PVD (peripheral vascular disease) (HCC).  has a past surgical history that includes Tibia fracture surgery (Left); Hernia repair; Colon surgery; and  Dilation and curettage of uterus. Prior to Admission medications   Medication Sig Start Date End Date Taking? Authorizing Provider  apixaban (ELIQUIS) 5 MG TABS tablet Take 5 mg by mouth 2 (two) times daily.   Yes [provider]  fenofibrate 160 MG tablet Take 160 mg by mouth daily.   Yes [provider]  KLOR-CON 10 10 MEQ tablet Take 20 mEq by mouth daily.  10/22/16  Yes [provider]  latanoprost (XALATAN) 0.005 % ophthalmic solution USE 1 DROP IN [facility] EYE AT BEDTIME 05/18/15  Yes [provider]  losartan (COZAAR) 25 MG tablet Take 2 tablets (50 mg total) by mouth daily. 11/08/17  Yes Wieting, Richard, MD  megestrol (MEGACE) 40 MG tablet Take 80 mg by mouth 2 (two) times daily. 12/08/17  Yes [provider]  metFORMIN (GLUCOPHAGE) 500 MG tablet Take 500 mg by mouth 2 (two) times daily with a meal.   Yes [provider]  metoprolol succinate (TOPROL-XL) 50 MG 24 hr tablet Take 50 mg by mouth daily. 09/04/17  Yes [provider]  mirtazapine (REMERON) 30 MG tablet Take 30 mg by mouth at bedtime.   Yes [provider]  multivitamin-lutein (OCUVITE-LUTEIN) CAPS capsule Take 1 capsule by mouth daily. 11/09/17  Yes Wieting, Richard, MD  risperiDONE (RISPERDAL) 1 MG tablet Take 1 tablet (1 mg total) by mouth at bedtime. 01/03/17  Yes Gladstone Lighter, MD  torsemide (DEMADEX) 20 MG tablet Take 20 mg by mouth daily.   Yes [provider]   No Known Allergies  FAMILY HISTORY:  family history includes Dementia in her mother; Valvular heart disease in her father. SOCIAL HISTORY:  reports that she has quit smoking. She has never used smokeless tobacco. She reports that she does  not drink alcohol or use drugs.   COVID-19 DISASTER DECLARATION:  FULL CONTACT PHYSICAL EXAMINATION WAS NOT POSSIBLE DUE TO TREATMENT OF COVID-19 AND  CONSERVATION OF PERSONAL PROTECTIVE EQUIPMENT, LIMITED EXAM FINDINGS INCLUDE-  Patient assessed or the symptoms described in the history of present illness.  In the context of the Global COVID-19 pandemic, which necessitated consideration that the patient might be at risk for infection with the SARS-CoV-2 virus that causes COVID-19, Institutional protocols and algorithms that pertain to the evaluation of patients at risk for COVID-19 are in a state of rapid change based on information released by regulatory bodies including the CDC and federal and state organizations. These policies and algorithms were followed during the patient's care while in hospital.  REVIEW OF SYSTEMS:  Positives in BOLD Constitutional: Negative for fever, chills, weight loss, malaise/fatigue and diaphoresis.  HENT: Negative for hearing loss, ear pain, nosebleeds, congestion, sore throat, neck pain, tinnitus and ear discharge.   Eyes: Negative for blurred vision, double vision, photophobia, pain, discharge and redness.  Respiratory: Negative for +cough, hemoptysis, sputum production, shortness of breath, wheezing and stridor.   Cardiovascular: Negative for chest pain, palpitations, orthopnea, claudication, leg swelling and PND.  Gastrointestinal: Negative for heartburn, nausea, vomiting, abdominal pain, diarrhea, constipation, blood in stool and melena.  Genitourinary: Negative for dysuria, urgency, frequency, hematuria and flank pain.  Musculoskeletal: Negative for myalgias, back pain, joint pain and falls.  Skin: Negative for itching and rash.  Neurological: Negative for dizziness, tingling, tremors, sensory change, speech change, focal weakness, seizures, loss of consciousness, weakness and headaches.  Endo/Heme/Allergies: Negative for environmental allergies and  polydipsia. Does not bruise/bleed easily.  SUBJECTIVE:  Pt reports intermittent cough productive of small amount of clear  sputum Denies shortness of breath, wheezing chest pain, fever/chills, N/V, Dizziness, palpitations Weaned off of BiPAP to 5L [place] (chronically on 3L)  VITAL SIGNS: Temp:  [98.2 F (36.8 C)-98.5 F (36.9 C)] 98.5 F (36.9 C) (03/31 1700) Pulse Rate:  [71-108] 96 (03/31 1700) Resp:  [21-35] 22 (03/31 1000) BP: (102-172)/(41-72) 122/62 (03/31 1700) SpO2:  [95 %-100 %] 95 % (03/31 1700) Weight:  [115.4 kg] 115.4 kg (03/31 0500)  PHYSICAL EXAMINATION: General:  Acute on chronically ill appearing female, sitting in bed, on 5L [place], in NAD Neuro:  Awake, A&O x4, follows commands, no focal deficits, speech clear HEENT:  Atraumatic, normocephalic, neck supple, no JVD Cardiovascular:  Regular rate & rhythm, s1s2, no M/R/G Lungs:  Clear diminished to auscultation bilaterally, even, nonlabored, normal effort Abdomen:  Obese, soft, nontender, nondistended, no guarding or rebound tenderness, BS+ x4 Musculoskeletal:  Normal bulk and tone, no deformities. Bilateral LE edema & erythema Skin:  Warm and dry.  Cellulitis to LLE.   Recent Labs  Lab 01/03/20 1445 01/04/20 0716 01/05/20 0620  NA 140 142 145  K 5.1 4.8 4.6  CL 109 110 110  CO2 23 25 29   BUN 30* 27* 19  CREATININE 2.09* 1.82* 1.58*  GLUCOSE 131* 97 101*   Recent Labs  Lab 01/04/20 0716 01/04/20 2337 01/05/20 0620  HGB 8.0* 8.3* 8.2*  HCT 27.6* 29.4* 28.5*  WBC 6.0 6.9 6.3  PLT 165 173 161   01/07/20 PELVIS (TRANSABDOMINAL ONLY)  Result Date: 01/04/2020 CLINICAL DATA:  Menorrhagia.  Severe anemia. EXAM: TRANSABDOMINAL ULTRASOUND OF PELVIS TECHNIQUE: Transabdominal ultrasound examination of the pelvis was performed including evaluation of the uterus, ovaries, adnexal regions, and pelvic cul-de-sac. COMPARISON:  Pelvic MRI 05/08/2017. CT abdomen and pelvis 07/14/2017. Pelvic ultrasound 07/27/2010. FINDINGS:  Examination is limited by patient body habitus. A transvaginal examination could not be performed. Uterus Measurements: 14.9 x 8.3 x 9.1 cm = volume: 589 mL. Chronically enlarged, heterogeneous uterus with clustered nodular echogenic foci near the uterine fundus. Endometrium Thickness: 9 mm.  No focal abnormality visualized. Right ovary Not visualized. Left ovary Not visualized. Other findings:  No abnormal free fluid. IMPRESSION: 1. Limited transabdominal pelvic ultrasound. 2. Chronically enlarged and heterogeneous uterus with diffuse adenomyosis diagnosed on prior MRI. 3. 9 mm endometrial thickness, less than on the 2018 MRI but still abnormal for age. In the setting of post-menopausal bleeding, endometrial sampling is indicated to exclude carcinoma. If results are benign, sonohysterogram should be considered for focal lesion work-up. (Ref: Radiological Reasoning: Algorithmic Workup of Abnormal Vaginal Bleeding with Endovaginal Sonography and Sonohysterography. AJR 2008; 2009) 4. Nonvisualization of the ovaries. Electronically Signed   By: [id number] M.D.   On: 01/04/2020 18:31   DG CHEST PORT 1 VIEW  Result Date: 01/04/2020 CLINICAL DATA:  Tachypnea EXAM: PORTABLE CHEST 1 VIEW COMPARISON:  11/04/2017 FINDINGS: Cardiac shadow is enlarged. Increased vascular congestion is noted with interstitial edema consistent with congestive failure. No focal infiltrate is seen. No acute bony abnormality is noted. Degenerative changes of the thoracic spine are seen. IMPRESSION: Changes consistent with CHF. Electronically Signed   By: 11/06/2017 M.D.   On: 01/04/2020 22:22   DG Foot Complete Left  Result Date: 01/04/2020 CLINICAL DATA:  Ulcer along the left heel and at the toes. EXAM: LEFT FOOT - COMPLETE 3+ VIEW COMPARISON:  Left foot radiographs 01/03/2012. FINDINGS: Postoperative changes of the distal tibia are again noted. Fractured screw is stable. Prominent plantar calcaneal spur is noted. Soft tissue  ulceration is evident without penetration of the bone. No acute or focal osseous abnormalities are associated. Extensive soft tissue swelling is present over the foot. Ulcerations involving the digits are not well appreciated due to overlap. No focal osseous erosion is evident. IMPRESSION: 1. Plantar ulceration at the calcaneus without acute osseous abnormality. 2. Prominent plantar spur at the calcaneus. 3. Extensive soft tissue swelling over the foot without other acute or focal osseous abnormality. Electronically Signed   By: Marin Roberts M.D.   On: 01/04/2020 09:22    ASSESSMENT / PLAN:  Acute on chronic hypoxic hypercapnic respiratory failure secondary to ? COPD exacerbation vs. Anesthesia -Supplemental O2 as needed to maintain O2 sats 88 to 94% -BiPAP, wean as tolerated ~ currently weaned to 5L [place] -Follow intermittent chest x-ray and ABG as needed -PRN Bronchodilators  Symptomatic anemia secondary to vaginal bleeding -Monitor for S/Sx of bleeding -Trend CBC -SCDs for VTE Prophylaxis (avoid chemical prophylaxis given bleeding) -Transfuse for Hgb <8 -OB/GYN following, appreciate input -S/p hysteroscopy with D&C, placement of Liletta IUD on 01/05/2020  Acute decompensated HFpEF Paroxysmal atrial fibrillation, rate currently controlled Nonsustained V. tach secondary to anemia and CHF Hypertension -Continuous cardiac monitoring -Maintain MAP greater than 65 -Continue metoprolol -Received 80 mg IV Lasix x1 dose 3/31 -Torsemide as BP and renal function permits -Cardiology consulted, appreciate input -Eliquis on hold due to bleeding -Echocardiogram pending  AKI -Monitor I&O's / urinary output -Follow BMP -Ensure adequate renal perfusion -Avoid nephrotoxic agents as able -Replace electrolytes as indicated  Left lower extremity ulcer with surrounding cellulitis -Monitor fever curve -Trend WBCs -Follow cultures as above -Continue cefazolin -X-ray negative for  osteomyelitis -Wound care following, appreciate input      Best practices: Disposition: Stepdown Goals of care: Full code VTE prophylaxis: SCDs Updates: Updated patient at bedside 01/05/2020  Harlon Ditty, [facility] [address] [facility] Pager: [phone]  01/05/2020, 8:08 PM.

## 2020-01-05 NOTE — Progress Notes (Signed)
This RN alerted to a few episodes of short-run Vtach.  Patient is asymptomatic otherwise, MD notified, patient NSR on monitor, does not appear to be in distress.  This RN administered Metoprolol as ordered, no further episodes noted.  This RN also noted that patient tends to "sleep hard" and may take a few minutes to wake up, vital signs WNL.  This RN woke up patient by boosting her in bed, as she wasn't responding to voice or touch.  Pre-op team aware. Will continue to monitor.

## 2020-01-05 NOTE — Anesthesia Procedure Notes (Signed)
Procedure Name: Intubation Performed by: Fletcher-Harrison, Taison Celani, CRNA Pre-anesthesia Checklist: Patient identified, Emergency Drugs available, Suction available and Patient being monitored Patient Re-evaluated:Patient Re-evaluated prior to induction Oxygen Delivery Method: Circle system utilized Preoxygenation: Pre-oxygenation with 100% oxygen Induction Type: IV induction Ventilation: Mask ventilation with difficulty Laryngoscope Size: McGraph and 3 Grade View: Grade I Tube type: Oral Tube size: 7.0 mm Number of attempts: 1 Airway Equipment and Method: Stylet and Video-laryngoscopy Placement Confirmation: ETT inserted through vocal cords under direct vision,  positive ETCO2,  CO2 detector and breath sounds checked- equal and bilateral Secured at: 21 cm Tube secured with: Tape Dental Injury: Teeth and Oropharynx as per pre-operative assessment        

## 2020-01-05 NOTE — Transfer of Care (Signed)
Immediate Anesthesia Transfer of Care Note  Patient: Jasmine Osborn  Procedure(s) Performed: DILATATION & CURETTAGE/HYSTEROSCOPY WITH NOVASURE ABLATION (N/A Uterus)  Patient Location: ICU  Anesthesia Type:General  Level of Consciousness: drowsy and responds to stimulation  Airway & Oxygen Therapy: Patient Spontanous Breathing and Patient connected to face mask oxygen  Post-op Assessment: Report given to RN and Post -op Vital signs reviewed and stable  Post vital signs: Reviewed and stable  Last Vitals:  Vitals Value Taken Time  BP    Temp    Pulse    Resp    SpO2      Last Pain:  Vitals:   01/05/20 0900  TempSrc:   PainSc: 0-No pain      Patients Stated Pain Goal: 0 (01/05/20 0900)  Complications: No apparent anesthesia complications. Report tp RT and ICU care RN in regards to pt status, pt placed in bipap as per pt and pt husband pt uses bipap for sleep, care transferred to ICU w/ sats >98% and pt hemodynamically stable. TFH

## 2020-01-06 ENCOUNTER — Inpatient Hospital Stay: Payer: Medicare HMO

## 2020-01-06 LAB — CBC
HCT: 28.2 % — ABNORMAL LOW (ref 36.0–46.0)
Hemoglobin: 7.8 g/dL — ABNORMAL LOW (ref 12.0–15.0)
MCH: 27.9 pg (ref 26.0–34.0)
MCHC: 27.7 g/dL — ABNORMAL LOW (ref 30.0–36.0)
MCV: 100.7 fL — ABNORMAL HIGH (ref 80.0–100.0)
Platelets: 170 10*3/uL (ref 150–400)
RBC: 2.8 MIL/uL — ABNORMAL LOW (ref 3.87–5.11)
RDW: 21.7 % — ABNORMAL HIGH (ref 11.5–15.5)
WBC: 8 10*3/uL (ref 4.0–10.5)
nRBC: 0 % (ref 0.0–0.2)

## 2020-01-06 LAB — MAGNESIUM: Magnesium: 2.1 mg/dL (ref 1.7–2.4)

## 2020-01-06 LAB — BASIC METABOLIC PANEL
Anion gap: 7 (ref 5–15)
BUN: 21 mg/dL (ref 8–23)
CO2: 29 mmol/L (ref 22–32)
Calcium: 8.6 mg/dL — ABNORMAL LOW (ref 8.9–10.3)
Chloride: 107 mmol/L (ref 98–111)
Creatinine, Ser: 1.52 mg/dL — ABNORMAL HIGH (ref 0.44–1.00)
GFR calc Af Amer: 40 mL/min — ABNORMAL LOW (ref >60)
GFR calc non Af Amer: 35 mL/min — ABNORMAL LOW (ref >60)
Glucose, Bld: 180 mg/dL — ABNORMAL HIGH (ref 70–99)
Potassium: 4.7 mmol/L (ref 3.5–5.1)
Sodium: 143 mmol/L (ref 135–145)

## 2020-01-06 LAB — BRAIN NATRIURETIC PEPTIDE: B Natriuretic Peptide: 481 pg/mL — ABNORMAL HIGH (ref 0.0–100.0)

## 2020-01-06 MED ORDER — DILTIAZEM HCL 30 MG PO TABS: 30.0000 mg | ORAL_TABLET | Freq: Four times a day (QID) | ORAL | Status: DC

## 2020-01-06 MED ORDER — DILTIAZEM HCL 25 MG/5ML IV SOLN
10.0000 mg | Freq: Once | INTRAVENOUS | Status: AC
  Administered 2020-01-06: 10 mg via INTRAVENOUS
  Filled 2020-01-06 (×2): qty 5

## 2020-01-06 NOTE — Progress Notes (Signed)
Husband updated via telephone

## 2020-01-06 NOTE — Progress Notes (Addendum)
Physical Therapy Treatment and Re-Evaluation Patient Details Name: Jasmine Osborn MRN: [id number] DOB: 03/08/1951 Today's Date: 01/06/2020    History of Present Illness Pt is 69 yo female that presented to ED for vaginal bleeding, SOB, and generalized weakness. Work up showed hemoglobin of 4, has had multiple transfusions, currently hemoglobin 7.8. Underwent D&C with heterscopy and IUD placement 3/31. PMH of afib, COPD, DM, morbid obesity, depression, HTN, PVD, chronic stasis edema, previous smoker.    PT Comments    Pt seen as a re-evaluation this session due to surgery yesterday. Pt oriented to self, place, situation, time, but presented with more grogginess/delayed responses than previous session, RN aware. The patient performed supine to sit with minA and was able to sit with fair balance for several minutes. Did complain of lightheadedness, HR in 110s and spO2 in 90s with 3L via [place]. Sit <> stand with CGA and RW twice, cued for hand placement to increase safety. Once in sitting pt able to perform a few seated exercises while waiting for second person assist for chair follow. The patient was able to ambulated ~27ft with RW and CGA, exhibited decreased gait velocity, step length/height, and fatigue with SOB. Poor spO2 reading in standing (pt stood for~15min), upon sitting 93%. Pt with OT at end of session. Pt did demonstrated progression towards goals this session, but due to current level of assistance needed and deficits compared to PLOF, SNF recommendation remains appropriate.     Follow Up Recommendations  SNF     Equipment Recommendations  Other (comment)    Recommendations for Other Services       Precautions / Restrictions Precautions Precautions: Fall Restrictions Weight Bearing Restrictions: No    Mobility  Bed Mobility Overal bed mobility: Needs Assistance Bed Mobility: Supine to Sit     Supine to sit: Min assist;HOB elevated        Transfers Overall transfer  level: Needs assistance Equipment used: Rolling walker (2 wheeled) Transfers: Sit to/from Stand Sit to Stand: Min guard         General transfer comment: Cued for hand placement with poor carryover  Ambulation/Gait Ambulation/Gait assistance: Min guard Gait Distance (Feet): 8 Feet Assistive device: Rolling walker (2 wheeled)       General Gait Details: Short, shuffled steps. Decreased step length/step height bilaterally, pt fatigued quickly. Did exhibit SOB, unclear spO2 reading   Stairs             Wheelchair Mobility    Modified Rankin (Stroke Patients Only)       Balance Overall balance assessment: Needs assistance Sitting-balance support: Feet supported Sitting balance-Leahy Scale: Poor Sitting balance - Comments: reliant on UE to maintain balance     Standing balance-Leahy Scale: Poor                              Cognition Arousal/Alertness: Awake/alert Behavior During Therapy: WFL for tasks assessed/performed                                   General Comments: oriented to self, place, disoriented to time (cued for year)      Exercises General Exercises - Lower Extremity Long Arc Quad: AROM;Strengthening;Both;10 reps Toe Raises: AROM;Both;15 reps Heel Raises: AROM;Both;15 reps    General Comments        Pertinent Vitals/Pain Pain Assessment: No/denies pain    Home  Living                      Prior Function            PT Goals (current goals can now be found in the care plan section) Progress towards PT goals: Progressing toward goals    Frequency    Min 2X/week      PT Plan Current plan remains appropriate    Co-evaluation              AM-PAC PT "6 Clicks" Mobility   Outcome Measure  Help needed turning from your back to your side while in a flat bed without using bedrails?: A Little Help needed moving from lying on your back to sitting on the side of a flat bed without using  bedrails?: A Little Help needed moving to and from a bed to a chair (including a wheelchair)?: A Little Help needed standing up from a chair using your arms (e.g., wheelchair or bedside chair)?: A Little Help needed to walk in hospital room?: A Little Help needed climbing 3-5 steps with a railing? : Total 6 Click Score: 16    End of Session Equipment Utilized During Treatment: Gait belt Activity Tolerance: Patient limited by fatigue;Treatment limited secondary to medical complications (Comment) Patient left: in chair;with call bell/phone within reach Nurse Communication: Mobility status PT Visit Diagnosis: Other abnormalities of gait and mobility (R26.89);Muscle weakness (generalized) (M62.81)     Time: 9924-2683 PT Time Calculation (min) (ACUTE ONLY): 20 min  Charges:  $Therapeutic Exercise: 8-22 mins                     Lieutenant Diego PT, DPT 11:22 AM,01/06/20

## 2020-01-06 NOTE — NC FL2 (Signed)
[place] MEDICAID FL2 LEVEL OF CARE SCREENING TOOL     IDENTIFICATION  Patient Name: Jasmine Osborn Birthdate: [birth year] Sex: female Admission Date (Current Location): 01/03/2020  [place] and [place] Number:  Chiropodist and Address:  [facility], [address], [place], [place] [address]      Provider Number: [id number]  Attending Physician Name and Address:  Lynn Ito, MD  Relative Name and Phone Number:  Lundynn Cohoon - [phone]    Current Level of Care: Hospital Recommended Level of Care: Skilled Nursing Facility Prior Approval Number:    Date Approved/Denied:   PASRR Number:    Discharge Plan: SNF    Current Diagnoses: Patient Active Problem List   Diagnosis Date Noted  . Cellulitis of left lower extremity   . Skin ulcer of toe of left foot, limited to breakdown of skin (HCC)   . AF (paroxysmal atrial fibrillation) (HCC)   . Symptomatic anemia 01/03/2020  . Vaginal bleeding 01/03/2020  . ARF (acute renal failure) (HCC) 01/03/2020  . Anemia 12/16/2017  . Persistent atrial fibrillation (HCC) 11/05/2017  . Morbid obesity (HCC) 11/05/2017  . Acute on chronic respiratory failure with hypoxia and hypercapnia (HCC) 11/02/2017  . Increased endometrial stripe thickness 06/04/2017  . Pressure injury of skin 12/23/2016  . Respiratory failure with hypercapnia (HCC) 12/22/2016    Orientation RESPIRATION BLADDER Height & Weight     Self, Time, Situation, Place  O2 Incontinent, External catheter Weight: 254 lb 6.6 oz (115.4 kg) Height:  5\' 4"  (162.6 cm)  BEHAVIORAL SYMPTOMS/MOOD NEUROLOGICAL BOWEL NUTRITION STATUS      Continent Diet  AMBULATORY STATUS COMMUNICATION OF NEEDS Skin   Limited Assist Verbally Other (Comment)(stretch marks)                       Personal Care Assistance Level of Assistance  Bathing, Feeding, Dressing Bathing Assistance: Maximum assistance Feeding assistance: Maximum  assistance Dressing Assistance: Maximum assistance     Functional Limitations Info             SPECIAL CARE FACTORS FREQUENCY  PT (By licensed PT), OT (By licensed OT)     PT Frequency: 5x/week OT Frequency: 5x/week            Contractures Contractures Info: Not present    Additional Factors Info  Code Status, Allergies Code Status Info: Full Code Allergies Info: NKA           Current Medications (01/06/2020):  This is the current hospital active medication list Current Facility-Administered Medications  Medication Dose Route Frequency Provider Last Rate Last Admin  . 0.9 %  sodium chloride infusion (Manually program via Guardrails IV Fluids)   Intravenous Once 03/07/2020, NP      . 0.9 %  sodium chloride infusion   Intravenous PRN Manuela Schwartz, MD 10 mL/hr at 01/06/20 0300 Rate Verify at 01/06/20 0300  . ceFAZolin (ANCEF) IVPB 2g/100 mL premix  2 g Intravenous Q8H 03/07/20, RPH 200 mL/hr at 01/06/20 0720 2 g at 01/06/20 0720  . ceFAZolin (ANCEF) IVPB 2g/100 mL premix  2 g Intravenous Once Schermerhorn, 03/07/20, MD      . chlorhexidine (PERIDEX) 0.12 % solution 15 mL  15 mL Mouth Rinse BID Ihor Austin, MD   15 mL at 01/05/20 2133  . Chlorhexidine Gluconate Cloth 2 % PADS 6 each  6 each Topical Q0600 2134, MD   6 each at 01/06/20 0537  .  collagenase (SANTYL) ointment   Topical Daily Loletha Grayer, MD   Given at 01/04/20 1604  . cyanocobalamin ((VITAMIN B-12)) injection 1,000 mcg  1,000 mcg Intramuscular Daily Loletha Grayer, MD   1,000 mcg at 01/04/20 1434  . fenofibrate tablet 160 mg  160 mg Oral Daily Loletha Grayer, MD   160 mg at 01/04/20 1043  . latanoprost (XALATAN) 0.005 % ophthalmic solution 1 drop  1 drop Both Eyes QHS Loletha Grayer, MD   1 drop at 01/05/20 2132  . MEDLINE mouth rinse  15 mL Mouth Rinse q12n4p Wieting, Richard, MD      . metoprolol succinate (TOPROL-XL) 24 hr tablet 50 mg  50 mg Oral Daily Loletha Grayer, MD   50 mg at 01/05/20 1914  . mirtazapine (REMERON) tablet 30 mg  30 mg Oral QHS Loletha Grayer, MD   30 mg at 01/05/20 2132  . multivitamin-lutein (OCUVITE-LUTEIN) capsule 1 capsule  1 capsule Oral Daily Loletha Grayer, MD   1 capsule at 01/04/20 1043  . ondansetron (ZOFRAN) tablet 4 mg  4 mg Oral Q6H PRN Elwyn Reach, MD       Or  . ondansetron (ZOFRAN) injection 4 mg  4 mg Intravenous Q6H PRN Gala Romney L, MD      . potassium chloride SA (KLOR-CON) CR tablet 20 mEq  20 mEq Oral Daily Loletha Grayer, MD   20 mEq at 01/04/20 1043  . risperiDONE (RISPERDAL) tablet 1 mg  1 mg Oral QHS Loletha Grayer, MD   1 mg at 01/05/20 2133  . torsemide (DEMADEX) tablet 20 mg  20 mg Oral Daily Wieting, Richard, MD      . tranexamic acid (LYSTEDA) tablet 1,300 mg  1,300 mg Oral TID Schermerhorn, Gwen Her, MD   1,300 mg at 01/05/20 2133     Discharge Medications: Please see discharge summary for a list of discharge medications.  Relevant Imaging Results:  Relevant Lab Results:   Additional Information SSN: [id number]  Lewis, LCSW

## 2020-01-06 NOTE — Progress Notes (Signed)
IV cardizem given. HR ranging in 120's mostly at rest, remains in Afib, BP 94/52 Cardio notified. Orders to continue to monitor for now.

## 2020-01-06 NOTE — Consult Note (Addendum)
WOC Nurse Consult Note: Reason for Consult: Consult requested for assessment of skin folds to abd and above vagina. Pt has red macerated skin to pannus areas and yellow moist partial thickness skin loss, mod amt yellow drainage, some odor. Appearance is consistent with intertrigo and moisture associated skin damage.  Affected area is approx 6X6cm and 3X3X.1cm. WOC requested use of Interdry to promote healing.   Dressing procedure/placement/frequency: Interdry silver-impregnated fabric ordered for use by bedside nurses and instructions provided.  This product should remain in place for 5 days for optimal plan of care to provide antimicrobial benefits and wick moisture away from skin.  Please re-consult if further assistance is needed.  Thank-you,  Cammie Mcgee MSN, RN, CWOCN, [place], CNS [phone]

## 2020-01-06 NOTE — Progress Notes (Signed)
PROGRESS NOTE    Elly Haffey Hiller  IEP:[id number] DOB: 05-Apr-1951 DOA: 01/03/2020 PCP: Kirk Ruths, MD    Brief Narrative:  CAITLAN CHAUCA is a 69 y.o. female with medical history significant of chronic atrial fibrillation on anticoagulation, chronic respiratory failure due to COPD, diabetes, morbid obesity, depression, hypertension, peripheral vascular disease, chronic stasis edema who came to the ER secondary to excessive vaginal bleed.      Consultants:   GYN  Procedures: transfusion  Antimicrobials:   cefazolin   Subjective: Saw pt this am. Denies any complaints. States feeling a little better. Early this am with nsvt.cxr was obtained and revealed CHF.  Lasix 80 mg IV x1 was given last night  Objective: Vitals:   01/06/20 0100 01/06/20 0346 01/06/20 0724 01/06/20 1127  BP: (!) 107/51 (!) 132/56 (!) 138/55 (!) 102/28  Pulse: 95 (!) 103 (!) 104 83  Resp: (!) '25 20 16 14  ' Temp:  98 F (36.7 C) 98.2 F (36.8 C) 98.4 F (36.9 C)  TempSrc:  Oral    SpO2: 99% 96% 100% 100%  Weight:  115.4 kg    Height:        Intake/Output Summary (Last 24 hours) at 01/06/2020 1431 Last data filed at 01/06/2020 1016 Gross per 24 hour  Intake 221.66 ml  Output 650 ml  Net -428.34 ml   Filed Weights   01/03/20 2255 01/05/20 0500 01/06/20 0346  Weight: 120.1 kg 115.4 kg 115.4 kg    Examination:  General exam: Appears calm and comfortable , sitting in chair, nad Respiratory system: Clear to auscultation. Respiratory effort normal.  No wheezing Cardiovascular system: S1 & S2 heard, RRR. No murmurs, rubs, gallops or clicks.  Gastrointestinal system: Abdomen is ndistended, soft and nontender.. Normal bowel sounds heard. Foley in place Central nervous system: Awakens. Grossly intact  Extremities: mild b/l edema. +b/l erythema.,  Improved a little Skin: Warm dry Psychiatry: Mood & affect appropriate in current setting.     Data Reviewed: I have personally reviewed  following labs and imaging studies  CBC: Recent Labs  Lab 01/03/20 1445 01/03/20 1445 01/04/20 0049 01/04/20 0716 01/04/20 2337 01/05/20 0620 01/06/20 0458  WBC 5.0  --   --  6.0 6.9 6.3 8.0  HGB 4.2*   < > 7.0* 8.0* 8.3* 8.2* 7.8*  HCT 17.3*   < > 25.2* 27.6* 29.4* 28.5* 28.2*  MCV 116.9*  --   --  97.9 99.7 99.0 100.7*  PLT 173  --   --  165 173 161 170   < > = values in this interval not displayed.   Basic Metabolic Panel: Recent Labs  Lab 01/03/20 1445 01/04/20 0716 01/05/20 0620 01/06/20 0458  NA 140 142 145 143  K 5.1 4.8 4.6 4.7  CL 109 110 110 107  CO2 '23 25 29 29  ' GLUCOSE 131* 97 101* 180*  BUN 30* 27* 19 21  CREATININE 2.09* 1.82* 1.58* 1.52*  CALCIUM 8.7* 8.6* 8.5* 8.6*  MG  --   --  2.0 2.1   GFR: Estimated Creatinine Clearance: 43.6 mL/min (A) (by C-G formula based on SCr of 1.52 mg/dL (H)). Liver Function Tests: Recent Labs  Lab 01/04/20 0716  AST 13*  ALT 12  ALKPHOS 35*  BILITOT 0.9  PROT 5.8*  ALBUMIN 2.9*   No results for input(s): LIPASE, AMYLASE in the last 168 hours. No results for input(s): AMMONIA in the last 168 hours. Coagulation Profile: Recent Labs  Lab 01/03/20 1448  INR 1.8*   Cardiac Enzymes: No results for input(s): CKTOTAL, CKMB, CKMBINDEX, TROPONINI in the last 168 hours. BNP (last 3 results) No results for input(s): PROBNP in the last 8760 hours. HbA1C: No results for input(s): HGBA1C in the last 72 hours. CBG: Recent Labs  Lab 01/03/20 2245  GLUCAP 140*   Lipid Profile: No results for input(s): CHOL, HDL, LDLCALC, TRIG, CHOLHDL, LDLDIRECT in the last 72 hours. Thyroid Function Tests: No results for input(s): TSH, T4TOTAL, FREET4, T3FREE, THYROIDAB in the last 72 hours. Anemia Panel: Recent Labs    01/04/20 0716  VITAMINB12 189  FERRITIN 31  TIBC 423  IRON 92   Sepsis Labs: No results for input(s): PROCALCITON, LATICACIDVEN in the last 168 hours.  Recent Results (from the past 240 hour(s))    Respiratory Panel by RT PCR (Flu A&B, Covid) - Nasopharyngeal Swab     Status: None   Collection Time: 01/03/20  5:26 PM   Specimen: Nasopharyngeal Swab  Result Value Ref Range Status   SARS Coronavirus 2 by RT PCR NEGATIVE NEGATIVE Final    Comment: (NOTE) SARS-CoV-2 target nucleic acids are NOT DETECTED. The SARS-CoV-2 RNA is generally detectable in upper respiratoy specimens during the acute phase of infection. The lowest concentration of SARS-CoV-2 viral copies this assay can detect is 131 copies/mL. A negative result does not preclude SARS-Cov-2 infection and should not be used as the sole basis for treatment or other patient management decisions. A negative result may occur with  improper specimen collection/handling, submission of specimen other than nasopharyngeal swab, presence of viral mutation(s) within the areas targeted by this assay, and inadequate number of viral copies (<131 copies/mL). A negative result must be combined with clinical observations, patient history, and epidemiological information. The expected result is Negative. Fact Sheet for Patients:  PinkCheek.be Fact Sheet for Healthcare Providers:  GravelBags.it This test is not yet ap proved or cleared by the [place] FDA and  has been authorized for detection and/or diagnosis of SARS-CoV-2 by FDA under an Emergency Use Authorization (EUA). This EUA will remain  in effect (meaning this test can be used) for the duration of the COVID-19 declaration under Section 564(b)(1) of the Act, 21 U.S.C. section 360bbb-3(b)(1), unless the authorization is terminated or revoked sooner.    Influenza A by PCR NEGATIVE NEGATIVE Final   Influenza B by PCR NEGATIVE NEGATIVE Final    Comment: (NOTE) The Xpert Xpress SARS-CoV-2/FLU/RSV assay is intended as an aid in  the diagnosis of influenza from Nasopharyngeal swab specimens and  should not be used as a sole  basis for treatment. Nasal washings and  aspirates are unacceptable for Xpert Xpress SARS-CoV-2/FLU/RSV  testing. Fact Sheet for Patients: PinkCheek.be Fact Sheet for Healthcare Providers: GravelBags.it This test is not yet approved or cleared by the [place] FDA and  has been authorized for detection and/or diagnosis of SARS-CoV-2 by  FDA under an Emergency Use Authorization (EUA). This EUA will remain  in effect (meaning this test can be used) for the duration of the  Covid-19 declaration under Section 564(b)(1) of the Act, 21  U.S.C. section 360bbb-3(b)(1), unless the authorization is  terminated or revoked. Performed at [facility], [address] [place], [place] [address]   MRSA PCR Screening     Status: None   Collection Time: 01/03/20 10:54 PM   Specimen: Nasopharyngeal  Result Value Ref Range Status   MRSA by PCR NEGATIVE NEGATIVE Final    Comment:  The GeneXpert MRSA Assay (FDA approved for NASAL specimens only), is one component of a comprehensive MRSA colonization surveillance program. It is not intended to diagnose MRSA infection nor to guide or monitor treatment for MRSA infections. Performed at [facility], [address], [place], [place] [address]          Radiology Studies: US PELVIS (TRANSABDOMINAL ONLY)  Result Date: 01/04/2020 CLINICAL DATA:  Menorrhagia.  Severe anemia. EXAM: TRANSABDOMINAL ULTRASOUND OF PELVIS TECHNIQUE: Transabdominal ultrasound examination of the pelvis was performed including evaluation of the uterus, ovaries, adnexal regions, and pelvic cul-de-sac. COMPARISON:  Pelvic MRI 05/08/2017. CT abdomen and pelvis 07/14/2017. Pelvic ultrasound 07/27/2010. FINDINGS: Examination is limited by patient body habitus. A transvaginal examination could not be performed. Uterus Measurements: 14.9 x 8.3 x 9.1 cm = volume: 589 mL. Chronically enlarged,  heterogeneous uterus with clustered nodular echogenic foci near the uterine fundus. Endometrium Thickness: 9 mm.  No focal abnormality visualized. Right ovary Not visualized. Left ovary Not visualized. Other findings:  No abnormal free fluid. IMPRESSION: 1. Limited transabdominal pelvic ultrasound. 2. Chronically enlarged and heterogeneous uterus with diffuse adenomyosis diagnosed on prior MRI. 3. 9 mm endometrial thickness, less than on the 2018 MRI but still abnormal for age. In the setting of post-menopausal bleeding, endometrial sampling is indicated to exclude carcinoma. If results are benign, sonohysterogram should be considered for focal lesion work-up. (Ref: Radiological Reasoning: Algorithmic Workup of Abnormal Vaginal Bleeding with Endovaginal Sonography and Sonohysterography. AJR 2008; 373:S28-76) 4. Nonvisualization of the ovaries. Electronically Signed   By: Logan Bores M.D.   On: 01/04/2020 18:31   DG Chest Port 1 View  Result Date: 01/06/2020 CLINICAL DATA:  Acute respiratory failure with hypoxia EXAM: PORTABLE CHEST 1 VIEW COMPARISON:  Two days ago FINDINGS: Cardiomegaly with vascular pedicle widening primarily from mediastinal fat. There is vascular congestion and hazy opacity at the bases. No visible pneumothorax IMPRESSION: Cardiomegaly and vascular congestion. Atelectasis, pleural fluid, or infection could be present at the bases. Electronically Signed   By: Monte Fantasia M.D.   On: 01/06/2020 05:17   DG CHEST PORT 1 VIEW  Result Date: 01/04/2020 CLINICAL DATA:  Tachypnea EXAM: PORTABLE CHEST 1 VIEW COMPARISON:  11/04/2017 FINDINGS: Cardiac shadow is enlarged. Increased vascular congestion is noted with interstitial edema consistent with congestive failure. No focal infiltrate is seen. No acute bony abnormality is noted. Degenerative changes of the thoracic spine are seen. IMPRESSION: Changes consistent with CHF. Electronically Signed   By: Inez Catalina M.D.   On: 01/04/2020 22:22    ECHOCARDIOGRAM COMPLETE  Result Date: 01/05/2020    ECHOCARDIOGRAM REPORT   Patient Name:   ODALYS WIN Date of Exam: 01/05/2020 Medical Rec #:  [id number]           Height:       64.0 in Accession #:    [id number]          Weight:       254.4 lb Date of Birth:  1951-05-23            BSA:          2.167 m Patient Age:    32 years            BP:           136/40 mmHg Patient Gender: F                   HR:  92 bpm. Exam Location:  [facility] Procedure: 2D Echo, Cardiac Doppler, Color Doppler and Intracardiac            Opacification Agent Indications:     R94.31 Abnormla EKG  History:         Patient has prior history of Echocardiogram examinations, most                  recent 11/04/2017. Risk Factors:Hypertension, Dyslipidemia,                  Diabetes and Obesity. Obesity hypoventilation syndrome. Atrial                  Fibrillation.  Sonographer:     Wilford Sports Rodgers-Jones Referring Phys:  [id number] Nolberto Hanlon Diagnosing Phys: Yolonda Kida MD IMPRESSIONS  1. Left ventricular ejection fraction, by estimation, is 65 to 70%. The left ventricle has normal function. The left ventricle has no regional wall motion abnormalities. Left ventricular diastolic parameters are consistent with Grade I diastolic dysfunction (impaired relaxation).  2. Right ventricular systolic function is normal. The right ventricular size is normal. There is moderately elevated pulmonary artery systolic pressure.  3. The mitral valve is normal in structure. Trivial mitral valve regurgitation.  4. The aortic valve is normal in structure. Aortic valve regurgitation is not visualized. FINDINGS  Left Ventricle: Left ventricular ejection fraction, by estimation, is 65 to 70%. The left ventricle has normal function. The left ventricle has no regional wall motion abnormalities. Definity contrast agent was given IV to delineate the left ventricular  endocardial borders. The left ventricular internal cavity size was normal in size.  There is no left ventricular hypertrophy. Left ventricular diastolic parameters are consistent with Grade I diastolic dysfunction (impaired relaxation). Right Ventricle: The right ventricular size is normal. No increase in right ventricular wall thickness. Right ventricular systolic function is normal. There is moderately elevated pulmonary artery systolic pressure. The tricuspid regurgitant velocity is 2.96 m/s, and with an assumed right atrial pressure of 10 mmHg, the estimated right ventricular systolic pressure is 06.3 mmHg. Left Atrium: Left atrial size was normal in size. Right Atrium: Right atrial size was normal in size. Pericardium: There is no evidence of pericardial effusion. Mitral Valve: The mitral valve is normal in structure. Trivial mitral valve regurgitation. Tricuspid Valve: The tricuspid valve is normal in structure. Tricuspid valve regurgitation is mild. Aortic Valve: The aortic valve is normal in structure. Aortic valve regurgitation is not visualized. Aortic valve mean gradient measures 10.2 mmHg. Aortic valve peak gradient measures 16.0 mmHg. Aortic valve area, by VTI measures 1.86 cm. Pulmonic Valve: The pulmonic valve was normal in structure. Pulmonic valve regurgitation is not visualized. Aorta: The aortic root is normal in size and structure. IAS/Shunts: The atrial septum is grossly normal.  LEFT VENTRICLE PLAX 2D LVIDd:         4.56 cm  Diastology LVIDs:         2.79 cm  LV e' lateral:   7.40 cm/s LV PW:         1.03 cm  LV E/e' lateral: 19.1 LV IVS:        0.98 cm  LV e' medial:    5.55 cm/s LVOT diam:     1.80 cm  LV E/e' medial:  25.4 LV SV:         69 LV SV Index:   32 LVOT Area:     2.54 cm  RIGHT VENTRICLE RV Basal diam:  3.43  cm RV S prime:     14.10 cm/s TAPSE (M-mode): 2.3 cm LEFT ATRIUM             Index       RIGHT ATRIUM           Index LA diam:        4.10 cm 1.89 cm/m  RA Area:     15.80 cm LA Vol (A2C):   61.3 ml 28.29 ml/m RA Volume:   42.20 ml  19.47 ml/m LA Vol  (A4C):   49.2 ml 22.70 ml/m LA Biplane Vol: 56.6 ml 26.12 ml/m  AORTIC VALVE AV Area (Vmax):    1.77 cm AV Area (Vmean):   1.72 cm AV Area (VTI):     1.86 cm AV Vmax:           199.75 cm/s AV Vmean:          153.500 cm/s AV VTI:            0.371 m AV Peak Grad:      16.0 mmHg AV Mean Grad:      10.2 mmHg LVOT Vmax:         139.00 cm/s LVOT Vmean:        104.000 cm/s LVOT VTI:          0.271 m LVOT/AV VTI ratio: 0.73  AORTA Ao Root diam: 2.90 cm MITRAL VALVE                TRICUSPID VALVE MV Area (PHT): 3.65 cm     TR Peak grad:   35.0 mmHg MV Decel Time: 208 msec     TR Vmax:        296.00 cm/s MV E velocity: 141.00 cm/s MV A velocity: 122.00 cm/s  SHUNTS MV E/A ratio:  1.16         Systemic VTI:  0.27 m                             Systemic Diam: 1.80 cm Dwayne Prince Rome MD Electronically signed by Yolonda Kida MD Signature Date/Time: 01/05/2020/10:58:40 PM    Final         Scheduled Meds: . sodium chloride   Intravenous Once  . chlorhexidine  15 mL Mouth Rinse BID  . Chlorhexidine Gluconate Cloth  6 each Topical Q0600  . collagenase   Topical Daily  . cyanocobalamin  1,000 mcg Intramuscular Daily  . fenofibrate  160 mg Oral Daily  . latanoprost  1 drop Both Eyes QHS  . mouth rinse  15 mL Mouth Rinse q12n4p  . metoprolol succinate  50 mg Oral Daily  . mirtazapine  30 mg Oral QHS  . multivitamin-lutein  1 capsule Oral Daily  . potassium chloride SA  20 mEq Oral Daily  . risperiDONE  1 mg Oral QHS  . torsemide  20 mg Oral Daily  . tranexamic acid  1,300 mg Oral TID   Continuous Infusions: . sodium chloride 10 mL/hr at 01/06/20 0300  .  ceFAZolin (ANCEF) IV 2 g (01/06/20 1431)  .  ceFAZolin (ANCEF) IV      Assessment & Plan:   Principal Problem:   Symptomatic anemia Active Problems:   Persistent atrial fibrillation (HCC)   Morbid obesity (HCC)   Vaginal bleeding   ARF (acute renal failure) (HCC)   Cellulitis of left lower extremity   Skin ulcer of toe of left foot,  limited to breakdown of skin (HCC)   AF (paroxysmal atrial fibrillation) ([place])   1. Symptomatic anemia with a hemoglobin of 4.2. Patient given transfusions (3 units of packed red blood cells) and hemoglobin up to 8 today.  Secondary to vaginal bleeding.  Status dilatation and curettage/hysteroscopy , vaginal bleed resolved. FYN following. D/c foley.progesterone IUD is in place . Per Gyn, stop Megace on discharge. 2. Iron deficiency anemia -given  IV iron  3. Vitamin B12 deficiency give B12 injections for 3 days 4. Acute on chronic kidney injury continue to monitor. close to baselineHold losartan for now.  5. 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 Echo with nml EF.cards following. Continue Torsemide. May need iv lasix prn. bnp elevated, but now improving. 6. NSVT- likely 2/2 anemia, chf. Given lasix. On beta blk. Echo nml ef. Cards following. Continue beta blk. 7. Essential hypertension. On metoprolol 8. Left lower extremity ulcer with surrounding cellulitis started Ancef. X-ray negative for osteomyelitis. Check an ESR. Wound care consult. 9. COPD with chronic respiratory failure on 3 L of oxygen 10. Paroxysmal atrial fibrillation hold Eliquis at this time. On metoprolol. Will ask gyn when to restart Elqiuis. 10.PT/OT: snf   DVT prophylaxis: scd, Eliquis on hold right now Code Status: Full Family Communication: None at bedside Disposition Plan: SNF when medically stable. Barrier: Need to find SNF. May need iv lasix prn.       LOS: 3 days   Time spent: 45 minutes with more than 50% on [place], MD [facility] Pager [phone]  If 7PM-7AM, please contact night-coverage [email or web address] Password [facility] 01/06/2020, 2:31 PM Patient ID: Jolee Ewing, female   DOB: Jul 10, 1951, 69 y.o.   MRN: [id number]

## 2020-01-06 NOTE — Progress Notes (Signed)
Foley catheter removed from patient. Tolerated well.

## 2020-01-06 NOTE — Care Management Important Message (Signed)
Important Message  Patient Details  Name: Jasmine Osborn MRN: [id number] Date of Birth: Jun 06, 1951   Medicare Important Message Given:  Yes     Bernadette Hoit 01/06/2020, 12:05 PM

## 2020-01-06 NOTE — Progress Notes (Signed)
[facility] Cardiology  Patient Description: Jasmine Osborn is a 69 year old, caucasian, female with PMH significant for chronic atrial fibrillation (on eliquis), COPD with chronic respiratory failure, DM Type II, HTN, PVD with chronic stasis edema, morbid obesity, and depression who was admitted to ICU for symptomatic anemia secondary to excessive vaginal bleeding that was further complicated by chronic anticoagulant use. While in Step-down (on 01/05/20) the patient experienced a 7-beat-run of Ventricular tachycardia in which she remained asymptomatic and she was also found to be experiencing CHF exacerbation. The patient was treated with IV lasix and  cardiology was consulted.    SUBJECTIVE: The patient reports to be doing well and denies any significant dyspnea at this time. The patient continues to deny any chest pain, palpitations, dizziness or syncope. The patient states that she did sleep fairly well on last night, but she reports having some mild fatigue that has been present since after her surgery.  OBJECTIVE: The patient appears fairly well and as if her skin color is returning back to normal. The patient does not have any s/s of active bleeding. She appears to have unlabored breathing and her O2 saturations are 99%. She has been transferred to the telemetry unit and is now on 3L of supplemental O2 via [place] which is her baseline at home. The telemetry monitoring continues to reveal Sinus arrhthymia with a HR of 96 bpm.    Vitals:   01/06/20 0100 01/06/20 0346 01/06/20 0724 01/06/20 1127  BP: (!) 107/51 (!) 132/56 (!) 138/55 (!) 102/28  Pulse: 95 (!) 103 (!) 104 83  Resp: (!) 25 20 16 14   Temp:  98 F (36.7 C) 98.2 F (36.8 C) 98.4 F (36.9 C)  TempSrc:  Oral    SpO2: 99% 96% 100% 100%  Weight:  115.4 kg    Height:         Intake/Output Summary (Last 24 hours) at 01/06/2020 1319 Last data filed at 01/06/2020 1016 Gross per 24 hour  Intake 721.66 ml  Output 650 ml  Net 71.66 ml    PHYSICAL  EXAM  General: Well developed, well nourished, in no acute distress HEENT:  Normocephalic and atramatic Neck:  No JVD.  Lungs: expiratory wheezees bilaterally to auscultation Heart: HRRR . Normal S1 and S2 without gallops or murmurs.  Abdomen: Bowel sounds are positive, abdomen soft, obese and non-tender  Msk:  Normal strength and tone for age. Extremities: +2BLE edema, BLE erythema, multiple ulcerations noted on the left foot. No clubbing or cyanosis   Neuro: Alert and oriented X 3. Psych:  Good affect, responds appropriately   LABS: Basic Metabolic Panel: Recent Labs    01/05/20 0620 01/06/20 0458  NA 145 143  K 4.6 4.7  CL 110 107  CO2 29 29  GLUCOSE 101* 180*  BUN 19 21  CREATININE 1.58* 1.52*  CALCIUM 8.5* 8.6*  MG 2.0 2.1   Liver Function Tests: Recent Labs    01/04/20 0716  AST 13*  ALT 12  ALKPHOS 35*  BILITOT 0.9  PROT 5.8*  ALBUMIN 2.9*   No results for input(s): LIPASE, AMYLASE in the last 72 hours. CBC: Recent Labs    01/05/20 0620 01/06/20 0458  WBC 6.3 8.0  HGB 8.2* 7.8*  HCT 28.5* 28.2*  MCV 99.0 100.7*  PLT 161 170   Cardiac Enzymes: No results for input(s): CKTOTAL, CKMB, CKMBINDEX, TROPONINI in the last 72 hours. BNP: Invalid input(s): POCBNP D-Dimer: No results for input(s): DDIMER in the last 72 hours. Hemoglobin A1C:  No results for input(s): HGBA1C in the last 72 hours. Fasting Lipid Panel: No results for input(s): CHOL, HDL, LDLCALC, TRIG, CHOLHDL, LDLDIRECT in the last 72 hours. Thyroid Function Tests: No results for input(s): TSH, T4TOTAL, T3FREE, THYROIDAB in the last 72 hours.  Invalid input(s): FREET3 Anemia Panel: Recent Labs    01/04/20 0716  VITAMINB12 189  FERRITIN 31  TIBC 423  IRON 92    US PELVIS (TRANSABDOMINAL ONLY)  Result Date: 01/04/2020 CLINICAL DATA:  Menorrhagia.  Severe anemia. EXAM: TRANSABDOMINAL ULTRASOUND OF PELVIS TECHNIQUE: Transabdominal ultrasound examination of the pelvis was performed  including evaluation of the uterus, ovaries, adnexal regions, and pelvic cul-de-sac. COMPARISON:  Pelvic MRI 05/08/2017. CT abdomen and pelvis 07/14/2017. Pelvic ultrasound 07/27/2010. FINDINGS: Examination is limited by patient body habitus. A transvaginal examination could not be performed. Uterus Measurements: 14.9 x 8.3 x 9.1 cm = volume: 589 mL. Chronically enlarged, heterogeneous uterus with clustered nodular echogenic foci near the uterine fundus. Endometrium Thickness: 9 mm.  No focal abnormality visualized. Right ovary Not visualized. Left ovary Not visualized. Other findings:  No abnormal free fluid. IMPRESSION: 1. Limited transabdominal pelvic ultrasound. 2. Chronically enlarged and heterogeneous uterus with diffuse adenomyosis diagnosed on prior MRI. 3. 9 mm endometrial thickness, less than on the 2018 MRI but still abnormal for age. In the setting of post-menopausal bleeding, endometrial sampling is indicated to exclude carcinoma. If results are benign, sonohysterogram should be considered for focal lesion work-up. (Ref: Radiological Reasoning: Algorithmic Workup of Abnormal Vaginal Bleeding with Endovaginal Sonography and Sonohysterography. AJR 2008; 453:M46-80) 4. Nonvisualization of the ovaries. Electronically Signed   By: Sebastian Ache M.D.   On: 01/04/2020 18:31   DG Chest Port 1 View  Result Date: 01/06/2020 CLINICAL DATA:  Acute respiratory failure with hypoxia EXAM: PORTABLE CHEST 1 VIEW COMPARISON:  Two days ago FINDINGS: Cardiomegaly with vascular pedicle widening primarily from mediastinal fat. There is vascular congestion and hazy opacity at the bases. No visible pneumothorax IMPRESSION: Cardiomegaly and vascular congestion. Atelectasis, pleural fluid, or infection could be present at the bases. Electronically Signed   By: Marnee Spring M.D.   On: 01/06/2020 05:17   DG CHEST PORT 1 VIEW  Result Date: 01/04/2020 CLINICAL DATA:  Tachypnea EXAM: PORTABLE CHEST 1 VIEW COMPARISON:   11/04/2017 FINDINGS: Cardiac shadow is enlarged. Increased vascular congestion is noted with interstitial edema consistent with congestive failure. No focal infiltrate is seen. No acute bony abnormality is noted. Degenerative changes of the thoracic spine are seen. IMPRESSION: Changes consistent with CHF. Electronically Signed   By: Alcide Clever M.D.   On: 01/04/2020 22:22   ECHOCARDIOGRAM COMPLETE  Result Date: 01/05/2020    ECHOCARDIOGRAM REPORT   Patient Name:   Jasmine Osborn Date of Exam: 01/05/2020 Medical Rec #:  [id number]           Height:       64.0 in Accession #:    [id number]          Weight:       254.4 lb Date of Birth:  Nov 29, 1950            BSA:          2.167 m Patient Age:    69 years            BP:           136/40 mmHg Patient Gender: F  HR:           92 bpm. Exam Location:  [facility] Procedure: 2D Echo, Cardiac Doppler, Color Doppler and Intracardiac            Opacification Agent Indications:     R94.31 Abnormla EKG  History:         Patient has prior history of Echocardiogram examinations, most                  recent 11/04/2017. Risk Factors:Hypertension, Dyslipidemia,                  Diabetes and Obesity. Obesity hypoventilation syndrome. Atrial                  Fibrillation.  Sonographer:     Sedonia Small Rodgers-Jones Referring Phys:  [id number] Lynn Ito Diagnosing Phys: Alwyn Pea MD IMPRESSIONS  1. Left ventricular ejection fraction, by estimation, is 65 to 70%. The left ventricle has normal function. The left ventricle has no regional wall motion abnormalities. Left ventricular diastolic parameters are consistent with Grade I diastolic dysfunction (impaired relaxation).  2. Right ventricular systolic function is normal. The right ventricular size is normal. There is moderately elevated pulmonary artery systolic pressure.  3. The mitral valve is normal in structure. Trivial mitral valve regurgitation.  4. The aortic valve is normal in structure. Aortic valve  regurgitation is not visualized. FINDINGS  Left Ventricle: Left ventricular ejection fraction, by estimation, is 65 to 70%. The left ventricle has normal function. The left ventricle has no regional wall motion abnormalities. Definity contrast agent was given IV to delineate the left ventricular  endocardial borders. The left ventricular internal cavity size was normal in size. There is no left ventricular hypertrophy. Left ventricular diastolic parameters are consistent with Grade I diastolic dysfunction (impaired relaxation). Right Ventricle: The right ventricular size is normal. No increase in right ventricular wall thickness. Right ventricular systolic function is normal. There is moderately elevated pulmonary artery systolic pressure. The tricuspid regurgitant velocity is 2.96 m/s, and with an assumed right atrial pressure of 10 mmHg, the estimated right ventricular systolic pressure is 45.0 mmHg. Left Atrium: Left atrial size was normal in size. Right Atrium: Right atrial size was normal in size. Pericardium: There is no evidence of pericardial effusion. Mitral Valve: The mitral valve is normal in structure. Trivial mitral valve regurgitation. Tricuspid Valve: The tricuspid valve is normal in structure. Tricuspid valve regurgitation is mild. Aortic Valve: The aortic valve is normal in structure. Aortic valve regurgitation is not visualized. Aortic valve mean gradient measures 10.2 mmHg. Aortic valve peak gradient measures 16.0 mmHg. Aortic valve area, by VTI measures 1.86 cm. Pulmonic Valve: The pulmonic valve was normal in structure. Pulmonic valve regurgitation is not visualized. Aorta: The aortic root is normal in size and structure. IAS/Shunts: The atrial septum is grossly normal.  LEFT VENTRICLE PLAX 2D LVIDd:         4.56 cm  Diastology LVIDs:         2.79 cm  LV e' lateral:   7.40 cm/s LV PW:         1.03 cm  LV E/e' lateral: 19.1 LV IVS:        0.98 cm  LV e' medial:    5.55 cm/s LVOT diam:     1.80  cm  LV E/e' medial:  25.4 LV SV:         69 LV SV Index:   32 LVOT Area:  2.54 cm  RIGHT VENTRICLE RV Basal diam:  3.43 cm RV S prime:     14.10 cm/s TAPSE (M-mode): 2.3 cm LEFT ATRIUM             Index       RIGHT ATRIUM           Index LA diam:        4.10 cm 1.89 cm/m  RA Area:     15.80 cm LA Vol (A2C):   61.3 ml 28.29 ml/m RA Volume:   42.20 ml  19.47 ml/m LA Vol (A4C):   49.2 ml 22.70 ml/m LA Biplane Vol: 56.6 ml 26.12 ml/m  AORTIC VALVE AV Area (Vmax):    1.77 cm AV Area (Vmean):   1.72 cm AV Area (VTI):     1.86 cm AV Vmax:           199.75 cm/s AV Vmean:          153.500 cm/s AV VTI:            0.371 m AV Peak Grad:      16.0 mmHg AV Mean Grad:      10.2 mmHg LVOT Vmax:         139.00 cm/s LVOT Vmean:        104.000 cm/s LVOT VTI:          0.271 m LVOT/AV VTI ratio: 0.73  AORTA Ao Root diam: 2.90 cm MITRAL VALVE                TRICUSPID VALVE MV Area (PHT): 3.65 cm     TR Peak grad:   35.0 mmHg MV Decel Time: 208 msec     TR Vmax:        296.00 cm/s MV E velocity: 141.00 cm/s MV A velocity: 122.00 cm/s  SHUNTS MV E/A ratio:  1.16         Systemic VTI:  0.27 m                             Systemic Diam: 1.80 cm Dwayne D Callwood MD Electronically signed by Alwyn Peawayne D Callwood MD Signature Date/Time: 01/05/2020/10:58:40 PM    Final       TELEMETRY: SA with HR= 96 bpm  Principal Problem:   Symptomatic anemia Active Problems:   Persistent atrial fibrillation (HCC)   Morbid obesity (HCC)   Vaginal bleeding   ARF (acute renal failure) (HCC)   Cellulitis of left lower extremity   Skin ulcer of toe of left foot, limited to breakdown of skin (HCC)   AF (paroxysmal atrial fibrillation) (HCC)   ASSESSMENT AND PLAN:  1. Acute on chronic HFpEF, likely due to rapid PRBC's administration, reasonably stable, ECHO revealed normal LV systolic function with and EF of 65-70%             - Continue home dosages of Torsemide and metoprolol             - Strict I & O's and daily weight  - heart  healthy diet               2. Non-sustained Ventricular Tachycardia likely due to hemodynamic instability (at that time) in the presence of severe anemia and acute on chronic CHF exacerbation, now resolved             -We will continue rate control with metoprolol  3. Paroxysmal atrial fibrillation, chronic, stable,  rate controlled  -Please restart anticoagulation with Eliquis as soon as the patient is medically stable and cleared by GYN              -Continue rate control with at home dosages of metoprolol   4. Acute on chronic hypoxic hypercapnic respiratory failure             -Agree with Bipap management              -Pulmonary consulted  5. Symptomatic anemia secondary to vaginal bleeding, s/p hysteroscopy with D&C and placement of IUD              - GYN management appreciated             -Recommend daily monitoring of H/H and for S/S of bleeding  -Please restart anticoagulation with Eliquis as soon as the patient is medically stable and cleared by GYN   6. HTN, reasonably controlled             -Continue metoprolol therapy  7. AKI, reasonably controlled             -Strict I &O's, trend BMP and replaced electrolytes as needed, avoid nephrotoxic agents if possible  8. LLE ulcerations further complicated by cellulitis             -WOC input appreciated               9. Hypertriglyceridemia, reasonably stable              - Continue fenofibrate    The patient's history, physical exam findings and plan of care were all discussed with Dr. Karma Greaser D. Callwood, whom also evaluated the patient, and all decision making was made in collaboration with him.   Catcher Dehoyos, ACNPC-AG  01/06/2020 1:19 PM

## 2020-01-06 NOTE — Progress Notes (Signed)
HR sustaining in 130's, patient resting in recliner.  EKG performed, AFib RVR, Attending MD and cardio notified, see new orders.

## 2020-01-06 NOTE — Consult Note (Signed)
CARDIOLOGY CONSULT NOTE               Patient ID: Jasmine DustmanDarlene O Osborn MRN: [id number] DOB/AGE: 69/01/1951 69 y.o.  Admit date: 01/03/2020 Referring Physician: Lynn ItoAmery, Sahar, MD   Primary Physician: Einar CrowAnderson, Marshall, MD  Primary Cardiologist: N/A Reason for Consultation: Non-sustained Ventricular Tachycardia  HPI: Jasmine Osborn is a 69 year old, caucasian, female with PMH significant for chronic atrial fibrillation (on eliquis), COPD with chronic respiratory failure, DM Type II, HTN, PVD with chronic stasis edema, morbid obesity, and depression who was admitted to ICU for symptomatic anemia secondary to excessive vaginal bleeding that was further complicated by chronic anticoagulant use.   ED Course: The patient presented on 01/03/20 with symptomatic anemia in which her Hemoglobin= 4.2, her electrolytes were out of balance and her vital signs were unstable including a bp=79/57, HR=105, respiratory rate=28 and she was slightly febrile at 99.4. The patient was treated with 2 units of PRBC's [facility].   While in Step-down (on 01/05/20) the patient experienced a 7-beat-run of Ventricular tachycardia in which she remained asymptomatic and she was also found to be experiencing CHF exacerbation. The patient was treated with IV lasix and  cardiology was consulted.   Upon examination the patient is only about 1 hour-post op from hysteroscopy with D&C and she remains on bipap that was placed after surgery due to the development of respiratory distress. The patient appears pale, is AOx3, very drowsy and resting comfortably in bed. The telemetry monitor reveals sinus arrhthymias with a rate of  65pm. The patient reports worsening dyspnea. She denies any chest pain, dizziness, palpitations or syncope.   Review of systems complete and found to be negative unless listed above    Past Medical History:  Diagnosis Date  . Atrial fibrillation (HCC)   . Chronic respiratory failure  with hypoxia and hypercapnia (HCC)   . Depression   . Diabetes mellitus without complication (HCC)   . Elevated lipids   . Hernia of abdominal wall   . Hypertension   . Increased endometrial stripe thickness 06/04/2017  . Increased endometrial stripe thickness 06/04/2017  . Obesity hypoventilation syndrome (HCC)   . Psychosis (HCC)   . PVD (peripheral vascular disease) (HCC)    heel ulcer Lt     Past Surgical History:  Procedure Laterality Date  . COLON SURGERY    . DILATION AND CURETTAGE OF UTERUS    . DILITATION & CURRETTAGE/HYSTROSCOPY WITH NOVASURE ABLATION N/A 01/05/2020   Procedure: DILATATION & CURETTAGE/HYSTEROSCOPY WITH NOVASURE ABLATION;  Surgeon: Christeen DouglasBeasley, Bethany, MD;  Location: [facility] ORS;  Service: Gynecology;  Laterality: N/A;  . HERNIA REPAIR    . TIBIA FRACTURE SURGERY Left     Medications Prior to Admission  Medication Sig Dispense Refill Last Dose  . apixaban (ELIQUIS) 5 MG TABS tablet Take 5 mg by mouth 2 (two) times daily.     . fenofibrate 160 MG tablet Take 160 mg by mouth daily.   01/03/2020 at 0800  . KLOR-CON 10 10 MEQ tablet Take 20 mEq by mouth daily.   0 01/03/2020 at 0800  . latanoprost (XALATAN) 0.005 % ophthalmic solution USE [facility]   01/02/2020 at 2200  . losartan (COZAAR) 25 MG tablet Take 2 tablets (50 mg total) by mouth daily. 30 tablet 0 01/03/2020 at 0800  . megestrol (MEGACE) 40 MG tablet Take 80 mg by mouth 2 (two) times daily.  11 01/03/2020 at 0800  . metFORMIN (  GLUCOPHAGE) 500 MG tablet Take 500 mg by mouth 2 (two) times daily with a meal.   01/03/2020 at 0800  . metoprolol succinate (TOPROL-XL) 50 MG 24 hr tablet Take 50 mg by mouth daily.  4 01/03/2020 at 0800  . mirtazapine (REMERON) 30 MG tablet Take 30 mg by mouth at bedtime.   01/02/2020 at 2200  . multivitamin-lutein (OCUVITE-LUTEIN) CAPS capsule Take 1 capsule by mouth daily. 30 capsule 0 01/03/2020 at 0800  . risperiDONE (RISPERDAL) 1 MG tablet Take 1 tablet (1 mg total) by  mouth at bedtime. 30 tablet 0 01/02/2020 at 2200  . torsemide (DEMADEX) 20 MG tablet Take 20 mg by mouth daily.   01/03/2020 at 0800   Social History   Socioeconomic History  . Marital status: Married    Spouse name: Not on file  . Number of children: Not on file  . Years of education: Not on file  . Highest education level: Not on file  Occupational History  . Not on file  Tobacco Use  . Smoking status: Former Games developer  . Smokeless tobacco: Never Used  Substance and Sexual Activity  . Alcohol use: No  . Drug use: No  . Sexual activity: Not on file  Other Topics Concern  . Not on file  Social History Narrative  . Not on file   Social Determinants of Health   Financial Resource Strain:   . Difficulty of Paying Living Expenses:   Food Insecurity:   . Worried About Programme researcher, broadcasting/film/video in the Last Year:   . Barista in the Last Year:   Transportation Needs:   . Freight forwarder (Medical):   Marland Kitchen Lack of Transportation (Non-Medical):   Physical Activity:   . Days of Exercise per Week:   . Minutes of Exercise per Session:   Stress:   . Feeling of Stress :   Social Connections:   . Frequency of Communication with Friends and Family:   . Frequency of Social Gatherings with Friends and Family:   . Attends Religious Services:   . Active Member of Clubs or Organizations:   . Attends Banker Meetings:   Marland Kitchen Marital Status:   Intimate Partner Violence:   . Fear of Current or Ex-Partner:   . Emotionally Abused:   Marland Kitchen Physically Abused:   . Sexually Abused:     Family History  Problem Relation Age of Onset  . Dementia Mother   . Valvular heart disease Father     Review of systems complete and found to be negative unless listed above   PHYSICAL EXAM  General: Well developed, well nourished, in no acute distress HEENT:  Normocephalic and atramatic Neck:  No JVD.  Lungs: Coarse crackles bilaterally to auscultation  Heart: HRRR . Normal S1 and S2 without  gallops or murmurs.  Abdomen: Bowel sounds are positive, abdomen soft, obese and non-tender  Msk:  Diminished strength for age. Extremities: +2BLE edema, BLE erythema, multiple ulcerations noted on the left foot. No clubbing or cyanosis Neuro: Alert and oriented X 3. Psych:  Flat affect, responds appropriately  Labs:   Lab Results  Component Value Date   WBC 8.0 01/06/2020   HGB 7.8 (L) 01/06/2020   HCT 28.2 (L) 01/06/2020   MCV 100.7 (H) 01/06/2020   PLT 170 01/06/2020    Recent Labs  Lab 01/04/20 0716 01/05/20 0620 01/06/20 0458  NA 142   < > 143  K 4.8   < > 4.7  CL 110   < > 107  CO2 25   < > 29  BUN 27*   < > 21  CREATININE 1.82*   < > 1.52*  CALCIUM 8.6*   < > 8.6*  PROT 5.8*  --   --   BILITOT 0.9  --   --   ALKPHOS 35*  --   --   ALT 12  --   --   AST 13*  --   --   GLUCOSE 97   < > 180*   < > = values in this interval not displayed.   Lab Results  Component Value Date   TROPONINI <0.03 11/02/2017   No results found for: CHOL No results found for: HDL No results found for: [facility] Lab Results  Component Value Date   TRIG 300 (H) 12/29/2016   TRIG 440 (H) 12/28/2016   TRIG 371 (H) 12/25/2016   No results found for: CHOLHDL No results found for: LDLDIRECT    Radiology: US PELVIS (TRANSABDOMINAL ONLY)  Result Date: 01/04/2020 CLINICAL DATA:  Menorrhagia.  Severe anemia. EXAM: TRANSABDOMINAL ULTRASOUND OF PELVIS TECHNIQUE: Transabdominal ultrasound examination of the pelvis was performed including evaluation of the uterus, ovaries, adnexal regions, and pelvic cul-de-sac. COMPARISON:  Pelvic MRI 05/08/2017. CT abdomen and pelvis 07/14/2017. Pelvic ultrasound 07/27/2010. FINDINGS: Examination is limited by patient body habitus. A transvaginal examination could not be performed. Uterus Measurements: 14.9 x 8.3 x 9.1 cm = volume: 589 mL. Chronically enlarged, heterogeneous uterus with clustered nodular echogenic foci near the uterine fundus. Endometrium  Thickness: 9 mm.  No focal abnormality visualized. Right ovary Not visualized. Left ovary Not visualized. Other findings:  No abnormal free fluid. IMPRESSION: 1. Limited transabdominal pelvic ultrasound. 2. Chronically enlarged and heterogeneous uterus with diffuse adenomyosis diagnosed on prior MRI. 3. 9 mm endometrial thickness, less than on the 2018 MRI but still abnormal for age. In the setting of post-menopausal bleeding, endometrial sampling is indicated to exclude carcinoma. If results are benign, sonohysterogram should be considered for focal lesion work-up. (Ref: Radiological Reasoning: Algorithmic Workup of Abnormal Vaginal Bleeding with Endovaginal Sonography and Sonohysterography. AJR 2008; 409:W11-91) 4. Nonvisualization of the ovaries. Electronically Signed   By: Sebastian Ache M.D.   On: 01/04/2020 18:31   DG Chest Port 1 View  Result Date: 01/06/2020 CLINICAL DATA:  Acute respiratory failure with hypoxia EXAM: PORTABLE CHEST 1 VIEW COMPARISON:  Two days ago FINDINGS: Cardiomegaly with vascular pedicle widening primarily from mediastinal fat. There is vascular congestion and hazy opacity at the bases. No visible pneumothorax IMPRESSION: Cardiomegaly and vascular congestion. Atelectasis, pleural fluid, or infection could be present at the bases. Electronically Signed   By: Marnee Spring M.D.   On: 01/06/2020 05:17   DG CHEST PORT 1 VIEW  Result Date: 01/04/2020 CLINICAL DATA:  Tachypnea EXAM: PORTABLE CHEST 1 VIEW COMPARISON:  11/04/2017 FINDINGS: Cardiac shadow is enlarged. Increased vascular congestion is noted with interstitial edema consistent with congestive failure. No focal infiltrate is seen. No acute bony abnormality is noted. Degenerative changes of the thoracic spine are seen. IMPRESSION: Changes consistent with CHF. Electronically Signed   By: Alcide Clever M.D.   On: 01/04/2020 22:22   DG Foot Complete Left  Result Date: 01/04/2020 CLINICAL DATA:  Ulcer along the left heel and at  the toes. EXAM: LEFT FOOT - COMPLETE 3+ VIEW COMPARISON:  Left foot radiographs 01/03/2012. FINDINGS: Postoperative changes of the distal tibia are again noted. Fractured screw is stable. Prominent plantar calcaneal  spur is noted. Soft tissue ulceration is evident without penetration of the bone. No acute or focal osseous abnormalities are associated. Extensive soft tissue swelling is present over the foot. Ulcerations involving the digits are not well appreciated due to overlap. No focal osseous erosion is evident. IMPRESSION: 1. Plantar ulceration at the calcaneus without acute osseous abnormality. 2. Prominent plantar spur at the calcaneus. 3. Extensive soft tissue swelling over the foot without other acute or focal osseous abnormality. Electronically Signed   By: San Morelle M.D.   On: 01/04/2020 09:22   ECHOCARDIOGRAM COMPLETE  Result Date: 01/05/2020    ECHOCARDIOGRAM REPORT   Patient Name:   CONTRINA ORONA Date of Exam: 01/05/2020 Medical Rec #:  [id number]           Height:       64.0 in Accession #:    [id number]          Weight:       254.4 lb Date of Birth:  [birth year]            BSA:          2.167 m Patient Age:    47 years            BP:           136/40 mmHg Patient Gender: F                   HR:           92 bpm. Exam Location:  [facility] Procedure: 2D Echo, Cardiac Doppler, Color Doppler and Intracardiac            Opacification Agent Indications:     R94.31 Abnormla EKG  History:         Patient has prior history of Echocardiogram examinations, most                  recent 11/04/2017. Risk Factors:Hypertension, Dyslipidemia,                  Diabetes and Obesity. Obesity hypoventilation syndrome. Atrial                  Fibrillation.  Sonographer:     Wilford Sports Rodgers-Jones Referring Phys:  [id number] Nolberto Hanlon Diagnosing Phys: Yolonda Kida MD IMPRESSIONS  1. Left ventricular ejection fraction, by estimation, is 65 to 70%. The left ventricle has normal function. The left ventricle has  no regional wall motion abnormalities. Left ventricular diastolic parameters are consistent with Grade I diastolic dysfunction (impaired relaxation).  2. Right ventricular systolic function is normal. The right ventricular size is normal. There is moderately elevated pulmonary artery systolic pressure.  3. The mitral valve is normal in structure. Trivial mitral valve regurgitation.  4. The aortic valve is normal in structure. Aortic valve regurgitation is not visualized. FINDINGS  Left Ventricle: Left ventricular ejection fraction, by estimation, is 65 to 70%. The left ventricle has normal function. The left ventricle has no regional wall motion abnormalities. Definity contrast agent was given IV to delineate the left ventricular  endocardial borders. The left ventricular internal cavity size was normal in size. There is no left ventricular hypertrophy. Left ventricular diastolic parameters are consistent with Grade I diastolic dysfunction (impaired relaxation). Right Ventricle: The right ventricular size is normal. No increase in right ventricular wall thickness. Right ventricular systolic function is normal. There is moderately elevated pulmonary artery systolic pressure. The tricuspid regurgitant velocity is 2.96 m/s, and with an  assumed right atrial pressure of 10 mmHg, the estimated right ventricular systolic pressure is 45.0 mmHg. Left Atrium: Left atrial size was normal in size. Right Atrium: Right atrial size was normal in size. Pericardium: There is no evidence of pericardial effusion. Mitral Valve: The mitral valve is normal in structure. Trivial mitral valve regurgitation. Tricuspid Valve: The tricuspid valve is normal in structure. Tricuspid valve regurgitation is mild. Aortic Valve: The aortic valve is normal in structure. Aortic valve regurgitation is not visualized. Aortic valve mean gradient measures 10.2 mmHg. Aortic valve peak gradient measures 16.0 mmHg. Aortic valve area, by VTI measures 1.86  cm. Pulmonic Valve: The pulmonic valve was normal in structure. Pulmonic valve regurgitation is not visualized. Aorta: The aortic root is normal in size and structure. IAS/Shunts: The atrial septum is grossly normal.  LEFT VENTRICLE PLAX 2D LVIDd:         4.56 cm  Diastology LVIDs:         2.79 cm  LV e' lateral:   7.40 cm/s LV PW:         1.03 cm  LV E/e' lateral: 19.1 LV IVS:        0.98 cm  LV e' medial:    5.55 cm/s LVOT diam:     1.80 cm  LV E/e' medial:  25.4 LV SV:         69 LV SV Index:   32 LVOT Area:     2.54 cm  RIGHT VENTRICLE RV Basal diam:  3.43 cm RV S prime:     14.10 cm/s TAPSE (M-mode): 2.3 cm LEFT ATRIUM             Index       RIGHT ATRIUM           Index LA diam:        4.10 cm 1.89 cm/m  RA Area:     15.80 cm LA Vol (A2C):   61.3 ml 28.29 ml/m RA Volume:   42.20 ml  19.47 ml/m LA Vol (A4C):   49.2 ml 22.70 ml/m LA Biplane Vol: 56.6 ml 26.12 ml/m  AORTIC VALVE AV Area (Vmax):    1.77 cm AV Area (Vmean):   1.72 cm AV Area (VTI):     1.86 cm AV Vmax:           199.75 cm/s AV Vmean:          153.500 cm/s AV VTI:            0.371 m AV Peak Grad:      16.0 mmHg AV Mean Grad:      10.2 mmHg LVOT Vmax:         139.00 cm/s LVOT Vmean:        104.000 cm/s LVOT VTI:          0.271 m LVOT/AV VTI ratio: 0.73  AORTA Ao Root diam: 2.90 cm MITRAL VALVE                TRICUSPID VALVE MV Area (PHT): 3.65 cm     TR Peak grad:   35.0 mmHg MV Decel Time: 208 msec     TR Vmax:        296.00 cm/s MV E velocity: 141.00 cm/s MV A velocity: 122.00 cm/s  SHUNTS MV E/A ratio:  1.16         Systemic VTI:  0.27 m  Systemic Diam: 1.80 cm Jasmine Pea MD Electronically signed by Jasmine Pea MD Signature Date/Time: 01/05/2020/10:58:40 PM    Final     EKG: (from 01/04/20) Sinus Arrhthymia with HR= 65 bpm  Telemetry monitor: Sinus Arrhthymia with HR= 98 bpm    ASSESSMENT AND PLAN:   1. Acute on chronic HFpEF, likely due to rapid PRBC's administration, fairly  stable  -Agree with IV diuresis with lasix  - Echocardiogram pending  - Continue home dosages of Torsemide and metoprolol  - Strict I & O's and daily weight   2. Non-sustained Ventricular Tachycardia likely due to previous hemodynamic instability in the presence of severe anemia and acute on chronic CHF exacerbation, now resolved  -We will continue rate control with metoprolol  3. Paroxysmal atrial fibrillation, chronic, stable, rate controlled  -Continue to hold eliquis in the presence of severe bleeding  -Continue rate control with at home dosages of metoprolol   4. Acute on chronic hypoxic hypercapnic respiratory failure  -Agree with Bipap management   -Pulmonary consulted  5. Symptomatic anemia secondary to vaginal bleeding, s/p hysteroscopy with D&C and placement of IUD   - GYN management appreciated  -Recommend daily monitoring of H/H and for S/S of bleeding  6. HTN, reasonably controlled  -Continue metoprolol therapy  7. AKI, reasonably controlled  -Strict I &O's, trend BMP and replaced electrolytes as needed, avoid nephrotoxic agents if possible  8. LLE ulcerations further complicated by cellulitis  -WOC input appreciated    9. Hypertriglyceridemia, reasonably stable   - Continue fenofibrate     The patient's history, physical exam findings and plan of care were all discussed with Dr. Gerda Diss D. Callwood, whom also evaluated the patient, and all decision making was made in collaboration with him.    Signed: Keandria Berrocal ACNPC-AG  01/06/2020, 12:19 PM

## 2020-01-06 NOTE — Evaluation (Signed)
Occupational Therapy Evaluation Patient Details Name: Jasmine Osborn MRN: [id number] DOB: January 31, 1951 Today's Date: 01/06/2020    History of Present Illness Pt is 69 yo female that presented to ED for vaginal bleeding, SOB, and generalized weakness. Work up showed hemoglobin of 4, has had multiple transfusions, currently hemoglobin 7.8. Underwent D&C with heterscopy and IUD placement 3/31. PMH of afib, COPD, DM, morbid obesity, depression, HTN, PVD, chronic stasis edema, previous smoker.   Clinical Impression   Pt was seen for OT evaluation this date. Prior to hospital admission, pt was independent. Pt lives with her spouse who is retired and able to assist as needed. Currently pt demonstrates impairments as described below (See OT problem list) which functionally limit her ability to perform ADL/self-care tasks and ADL mobility. Pt currently requires CGA for ADL transfers and a chair follow for any ADL mobility. Min A for LB ADL tasks. Pt would benefit from skilled OT to address noted impairments and functional limitations (see below for any additional details) in order to maximize safety and independence while minimizing falls risk and caregiver burden. Upon hospital discharge, recommend STR to maximize pt safety and return to PLOF.     Follow Up Recommendations  SNF    Equipment Recommendations  3 in 1 bedside commode(bariatric)    Recommendations for Other Services       Precautions / Restrictions Precautions Precautions: Fall Restrictions Weight Bearing Restrictions: No      Mobility Bed Mobility Overal bed mobility: Needs Assistance Bed Mobility: Supine to Sit     Supine to sit: Min assist;HOB elevated     General bed mobility comments: Pt up in chair at start of OT session  Transfers Overall transfer level: Needs assistance Equipment used: Rolling walker (2 wheeled) Transfers: Sit to/from Stand Sit to Stand: Min guard         General transfer comment: Cued for  hand placement with poor carryover; +2 chair follow    Balance Overall balance assessment: Needs assistance Sitting-balance support: Feet supported Sitting balance-Leahy Scale: Poor Sitting balance - Comments: reliant on UE to maintain balance   Standing balance support: Bilateral upper extremity supported Standing balance-Leahy Scale: Poor                             ADL either performed or assessed with clinical judgement   ADL Overall ADL's : Needs assistance/impaired                                       General ADL Comments: CGA for ADL transfers, Min A for LB ADL tasks     Vision Baseline Vision/History: Wears glasses Wears Glasses: At all times Patient Visual Report: No change from baseline       Perception     Praxis      Pertinent Vitals/Pain Pain Assessment: No/denies pain     Hand Dominance Right   Extremity/Trunk Assessment Upper Extremity Assessment Upper Extremity Assessment: Generalized weakness   Lower Extremity Assessment Lower Extremity Assessment: Generalized weakness   Cervical / Trunk Assessment Cervical / Trunk Assessment: Kyphotic;Other exceptions Cervical / Trunk Exceptions: abdominal hernia   Communication Communication Communication: No difficulties   Cognition Arousal/Alertness: Awake/alert Behavior During Therapy: WFL for tasks assessed/performed  General Comments: oriented to self, place, disoriented to time (cued for year); appears to have slightly delayed processing   General Comments       Exercises General Exercises - Lower Extremity Long Arc Quad: AROM;Strengthening;Both;10 reps Toe Raises: AROM;Both;15 reps Heel Raises: AROM;Both;15 reps Other Exercises Other Exercises: Pt instructed in pursed lip breathing and activity pacing to support safety and participation in ADL and IADL   Shoulder Instructions      Home Living Family/patient  expects to be discharged to:: Private residence Living Arrangements: Spouse/significant other Available Help at Discharge: Family;Available 24 hours/day Type of Home: House Home Access: Stairs to enter;Ramped entrance Entrance Stairs-Number of Steps: 5   Home Layout: One level     Bathroom Shower/Tub: Walk-in shower;Tub/shower unit   Bathroom Toilet: Standard Bathroom Accessibility: Yes   Home Equipment: Walker - 2 wheels;Bedside commode;Wheelchair - manual;Cane - single point;Grab bars - toilet   Additional Comments: Pt stated no falls in the last 6 months. wears O2 all day, has for about 6 months.      Prior Functioning/Environment Level of Independence: Needs assistance    ADL's / Homemaking Assistance Needed: normally doesnt need assistance for bathing/dressing but in the last few weeks has needed assistance from husband            OT Problem List: Decreased strength;Decreased cognition;Decreased safety awareness;Decreased activity tolerance;Impaired balance (sitting and/or standing);Decreased knowledge of use of DME or AE;Obesity      OT Treatment/Interventions: Self-care/ADL training;Therapeutic exercise;Therapeutic activities;Energy conservation;Cognitive remediation/compensation;DME and/or AE instruction;Patient/family education;Balance training    OT Goals(Current goals can be found in the care plan section) Acute Rehab OT Goals Patient Stated Goal: to get her strength back OT Goal Formulation: With patient Time For Goal Achievement: 01/20/20 Potential to Achieve Goals: Good ADL Goals Pt Will Perform Lower Body Dressing: with min guard assist;sit to/from stand;with adaptive equipment Pt Will Transfer to Toilet: with supervision;ambulating;bedside commode  OT Frequency: Min 2X/week   Barriers to D/C:            Co-evaluation              AM-PAC OT "6 Clicks" Daily Activity     Outcome Measure Help from another person eating meals?: None Help from  another person taking care of personal grooming?: None Help from another person toileting, which includes using toliet, bedpan, or urinal?: A Little Help from another person bathing (including washing, rinsing, drying)?: A Little Help from another person to put on and taking off regular upper body clothing?: A Little Help from another person to put on and taking off regular lower body clothing?: A Little 6 Click Score: 20   End of Session Equipment Utilized During Treatment: Gait belt;Rolling walker;Oxygen Nurse Communication: Other (comment)(pt needs chair alarm box)  Activity Tolerance: Patient tolerated treatment well Patient left: in chair;with call bell/phone within reach  OT Visit Diagnosis: Other abnormalities of gait and mobility (R26.89);Muscle weakness (generalized) (M62.81)                Time: 7673-4193 OT Time Calculation (min): 12 min Charges:  OT General Charges $OT Visit: 1 Visit OT Evaluation $OT Eval Moderate Complexity: 1 Mod  Jeni Salles, MPH, MS, OTR/L ascom [phone] 01/06/20, 1:30 PM

## 2020-01-06 NOTE — Progress Notes (Signed)
1 Day Post-Op       Procedure(s): DILATATION & CURETTAGE/HYSTEROSCOPY WITH NOVASURE ABLATION (N/A) Subjective: The patient is doing well.  No nausea or vomiting. Pain is adequately controlled. Foley in place, plan for removal tomorrow. She is out of bed to chair, has not yet had lunch but has ordered it.  Objective: Vital signs in last 24 hours: Temp:  [98 F (36.7 C)-98.6 F (37 C)] 98.4 F (36.9 C) (04/01 1127) Pulse Rate:  [81-104] 83 (04/01 1127) Resp:  [0-28] 14 (04/01 1127) BP: (97-138)/(28-76) 102/28 (04/01 1127) SpO2:  [95 %-100 %] 100 % (04/01 1127) Weight:  [115.4 kg] 115.4 kg (04/01 0346)  Intake/Output  Intake/Output Summary (Last 24 hours) at 01/06/2020 1233 Last data filed at 01/06/2020 1016 Gross per 24 hour  Intake 721.66 ml  Output 650 ml  Net 71.66 ml    Physical Exam:  General: Alert and oriented. GI: Soft, obese. Nondistended. Hernia remarkably changed from baseline. Urine: Clear, Foley in place   Lab Results: Recent Labs    01/04/20 2337 01/05/20 0620 01/06/20 0458  HGB 8.3* 8.2* 7.8*  HCT 29.4* 28.5* 28.2*  WBC 6.9 6.3 8.0  PLT 173 161 170                 Results for orders placed or performed during the hospital encounter of 01/03/20 (from the past 24 hour(s))  Blood gas, arterial     Status: Abnormal   Collection Time: 01/05/20  3:00 PM  Result Value Ref Range   FIO2 0.40    Delivery systems BILEVEL POSITIVE AIRWAY PRESSURE    Inspiratory PAP 20    Expiratory PAP 5    pH, Arterial 7.28 (L) 7.350 - 7.450   pCO2 arterial 57 (H) 32.0 - 48.0 mmHg   pO2, Arterial 68 (L) 83.0 - 108.0 mmHg   Bicarbonate 26.8 20.0 - 28.0 mmol/L   Acid-base deficit 1.0 0.0 - 2.0 mmol/L   O2 Saturation 90.7 %   Patient temperature 37.0    Collection site RIGHT RADIAL    Sample type ARTERIAL DRAW    Allens test (pass/fail) PASS PASS  Blood gas, venous     Status: Abnormal (Preliminary result)   Collection Time: 01/05/20  6:19 PM  Result Value Ref Range   FIO2 0.30    Delivery systems BILEVEL POSITIVE AIRWAY PRESSURE    pH, Ven 7.29 7.250 - 7.430   pCO2, Ven 62 (H) 44.0 - 60.0 mmHg   pO2, Ven PENDING 32.0 - 45.0 mmHg   Bicarbonate 29.8 (H) 20.0 - 28.0 mmol/L   Acid-Base Excess 1.6 0.0 - 2.0 mmol/L   O2 Saturation 41.8 %   Patient temperature 37.0    Collection site VEIN    Sample type VENOUS   Magnesium     Status: None   Collection Time: 01/06/20  4:58 AM  Result Value Ref Range   Magnesium 2.1 1.7 - 2.4 mg/dL  CBC     Status: Abnormal   Collection Time: 01/06/20  4:58 AM  Result Value Ref Range   WBC 8.0 4.0 - 10.5 K/uL   RBC 2.80 (L) 3.87 - 5.11 MIL/uL   Hemoglobin 7.8 (L) 12.0 - 15.0 g/dL   HCT 45.6 (L) 25.6 - 38.9 %   MCV 100.7 (H) 80.0 - 100.0 fL   MCH 27.9 26.0 - 34.0 pg   MCHC 27.7 (L) 30.0 - 36.0 g/dL   RDW 37.3 (H) 42.8 - 76.8 %   Platelets 170 150 -  400 K/uL   nRBC 0.0 0.0 - 0.2 %  Basic metabolic panel     Status: Abnormal   Collection Time: 01/06/20  4:58 AM  Result Value Ref Range   Sodium 143 135 - 145 mmol/L   Potassium 4.7 3.5 - 5.1 mmol/L   Chloride 107 98 - 111 mmol/L   CO2 29 22 - 32 mmol/L   Glucose, Bld 180 (H) 70 - 99 mg/dL   BUN 21 8 - 23 mg/dL   Creatinine, Ser 1.52 (H) 0.44 - 1.00 mg/dL   Calcium 8.6 (L) 8.9 - 10.3 mg/dL   GFR calc non Af Amer 35 (L) >60 mL/min   GFR calc Af Amer 40 (L) >60 mL/min   Anion gap 7 5 - 15  Brain natriuretic peptide     Status: Abnormal   Collection Time: 01/06/20  4:58 AM  Result Value Ref Range   B Natriuretic Peptide 481.0 (H) 0.0 - 100.0 pg/mL    Assessment/Plan: 1 Day Post-Op       Procedure(s): DILATATION & CURETTAGE/HYSTEROSCOPY WITH NOVASURE ABLATION (N/A) From a postoperative standpoint, patient is doing well. She is still anemic, but vaginal bleeding is almost completely resolved today. The progesterone IUD is in place and we hope it will continue to minimize proliferative endometrial growth and allow her to stop bleeding altogether. I recommend she  not continue her Megace when she leaves the hospital.  1) Ambulate, Incentive spirometry 2) Advance diet as tolerated 3) D/C Foley today 4) Anemia: Treatment per primary team 5) Discharge planning: Per primary team  We will signoff for now. Please call with questions.  Benjaman Kindler, MD   LOS: 3 days   Benjaman Kindler 01/06/2020, 12:33 PM

## 2020-01-06 NOTE — Progress Notes (Signed)
Upon initial assessment patient on BiPAP and weaned to 5L [place]. Patient remains A&OX4, denies pain, and O2 weaned back to baseline of 3L [place]. Foley remains intact with adequate urine output.   Patient assisted with bath and multiple skin ulcerations/MASD noted. Wound consult placed via 2A nurse for further evaluation. Pressure injury prevention education reviewed with patient and accepting.

## 2020-01-07 LAB — CBC
HCT: 27.4 % — ABNORMAL LOW (ref 36.0–46.0)
Hemoglobin: 7.6 g/dL — ABNORMAL LOW (ref 12.0–15.0)
MCH: 28.4 pg (ref 26.0–34.0)
MCHC: 27.7 g/dL — ABNORMAL LOW (ref 30.0–36.0)
MCV: 102.2 fL — ABNORMAL HIGH (ref 80.0–100.0)
Platelets: 164 10*3/uL (ref 150–400)
RBC: 2.68 MIL/uL — ABNORMAL LOW (ref 3.87–5.11)
RDW: 22 % — ABNORMAL HIGH (ref 11.5–15.5)
WBC: 6.7 10*3/uL (ref 4.0–10.5)
nRBC: 0 % (ref 0.0–0.2)

## 2020-01-07 LAB — SURGICAL PATHOLOGY

## 2020-01-07 MED ORDER — DILTIAZEM HCL 30 MG PO TABS
30.0000 mg | ORAL_TABLET | Freq: Four times a day (QID) | ORAL | Status: DC
  Administered 2020-01-07 – 2020-01-08 (×4): 30 mg via ORAL
  Filled 2020-01-07 (×4): qty 1

## 2020-01-07 MED ORDER — FUROSEMIDE 10 MG/ML IJ SOLN
20.0000 mg | Freq: Once | INTRAMUSCULAR | Status: AC
  Administered 2020-01-07: 20 mg via INTRAVENOUS
  Filled 2020-01-07: qty 2

## 2020-01-07 MED ORDER — APIXABAN 5 MG PO TABS
5.0000 mg | ORAL_TABLET | Freq: Two times a day (BID) | ORAL | Status: DC
  Administered 2020-01-07 – 2020-01-10 (×7): 5 mg via ORAL
  Filled 2020-01-07 (×7): qty 1

## 2020-01-07 NOTE — Progress Notes (Addendum)
CCMD called and reported that pt just have 16 beats of non sustain V-tach. On assessment pt was not on any distress and no complaints of pain Notify prime. Will continue to monitor.  Update 0028: Talked to [facility] NP but no new order place. Will continue to monitor.  Update 0200: CCMD called and reported that pt just have an episode of 20 beats bob sustain vtach. On assessment pt was asleep. VSS except oxygen at 89% on 3 liters. Pt was bump up to 3.5 liters and now sating at 93%.   Update 0301: Talked to NP ouma but states to just monitor pt at this time. No new order was place. Will continue to monitor.  Update [phone]: CCMD called and reported 6 brat of non sustain vtach. Pt was asymtomatic. Notify prime. Will continue to monitor.

## 2020-01-07 NOTE — Progress Notes (Signed)
Patient Name: Jasmine Osborn Date of Encounter: 01/07/2020  Hospital Problem List     Principal Problem:   Symptomatic anemia Active Problems:   Persistent atrial fibrillation (HCC)   Morbid obesity (HCC)   Vaginal bleeding   ARF (acute renal failure) (HCC)   Cellulitis of left lower extremity   Skin ulcer of toe of left foot, limited to breakdown of skin (HCC)   AF (paroxysmal atrial fibrillation) [facility])    Patient Profile      69 year old, caucasian, female with PMH significant for chronic atrial fibrillation (on eliquis), COPD with chronic respiratory failure, DM Type II, HTN, PVD with chronic stasis edema, morbid obesity, and depression who was admitted to ICU for symptomatic anemia secondary to excessive vaginal bleeding that was further complicated by chronic anticoagulant use.  Subjective   Asymptomatic. Afib rate improved and she appears to be in nsr at present.   Inpatient Medications    . sodium chloride   Intravenous Once  . chlorhexidine  15 mL Mouth Rinse BID  . Chlorhexidine Gluconate Cloth  6 each Topical Q0600  . collagenase   Topical Daily  . cyanocobalamin  1,000 mcg Intramuscular Daily  . diltiazem  30 mg Oral Q6H  . fenofibrate  160 mg Oral Daily  . latanoprost  1 drop Both Eyes QHS  . mouth rinse  15 mL Mouth Rinse q12n4p  . metoprolol succinate  50 mg Oral Daily  . mirtazapine  30 mg Oral QHS  . multivitamin-lutein  1 capsule Oral Daily  . potassium chloride SA  20 mEq Oral Daily  . risperiDONE  1 mg Oral QHS  . torsemide  20 mg Oral Daily  . tranexamic acid  1,300 mg Oral TID    Vital Signs    Vitals:   01/07/20 0128 01/07/20 0333 01/07/20 0400 01/07/20 0535  BP: (!) 112/55 119/60    Pulse: (!) 113 (!) 138    Resp: (!) 22 20  20   Temp:  98.1 F (36.7 C)    TempSrc:  Oral    SpO2: 100% 97%    Weight:   116 kg   Height:        Intake/Output Summary (Last 24 hours) at 01/07/2020 0835 Last data filed at 01/07/2020 0500 Gross per 24  hour  Intake 460 ml  Output 650 ml  Net -190 ml   Filed Weights   01/05/20 0500 01/06/20 0346 01/07/20 0400  Weight: 115.4 kg 115.4 kg 116 kg    Physical Exam    GEN: Well nourished, well developed, in no acute distress.  HEENT: normal.  Neck: Supple, no JVD, carotid bruits, or masses. Cardiac: RRR, no murmurs, rubs, or gallops. No clubbing, cyanosis, edema.  Radials/DP/PT 2+ and equal bilaterally.  Respiratory:  Respirations regular and unlabored, clear to auscultation bilaterally. GI: Soft, nontender, nondistended, BS + x 4. MS: no deformity or atrophy. Skin: warm and dry, no rash. Neuro:  Strength and sensation are intact. Psych: Normal affect.  Labs    CBC Recent Labs    01/06/20 0458 01/07/20 0448  WBC 8.0 6.7  HGB 7.8* 7.6*  HCT 28.2* 27.4*  MCV 100.7* 102.2*  PLT 170 638   Basic Metabolic Panel Recent Labs    01/05/20 0620 01/06/20 0458  NA 145 143  K 4.6 4.7  CL 110 107  CO2 29 29  GLUCOSE 101* 180*  BUN 19 21  CREATININE 1.58* 1.52*  CALCIUM 8.5* 8.6*  MG 2.0 2.1  Liver Function Tests No results for input(s): AST, ALT, ALKPHOS, BILITOT, PROT, ALBUMIN in the last 72 hours. No results for input(s): LIPASE, AMYLASE in the last 72 hours. Cardiac Enzymes No results for input(s): CKTOTAL, CKMB, CKMBINDEX, TROPONINI in the last 72 hours. BNP Recent Labs    01/05/20 0620 01/06/20 0458  BNP 559.0* 481.0*   D-Dimer No results for input(s): DDIMER in the last 72 hours. Hemoglobin A1C No results for input(s): HGBA1C in the last 72 hours. Fasting Lipid Panel No results for input(s): CHOL, HDL, LDLCALC, TRIG, CHOLHDL, LDLDIRECT in the last 72 hours. Thyroid Function Tests No results for input(s): TSH, T4TOTAL, T3FREE, THYROIDAB in the last 72 hours.  Invalid input(s): FREET3  Telemetry    afib with rvr converted to nsr   ECG       Radiology    US PELVIS (TRANSABDOMINAL ONLY)  Result Date: 01/04/2020 CLINICAL DATA:  Menorrhagia.  Severe  anemia. EXAM: TRANSABDOMINAL ULTRASOUND OF PELVIS TECHNIQUE: Transabdominal ultrasound examination of the pelvis was performed including evaluation of the uterus, ovaries, adnexal regions, and pelvic cul-de-sac. COMPARISON:  Pelvic MRI 05/08/2017. CT abdomen and pelvis 07/14/2017. Pelvic ultrasound 07/27/2010. FINDINGS: Examination is limited by patient body habitus. A transvaginal examination could not be performed. Uterus Measurements: 14.9 x 8.3 x 9.1 cm = volume: 589 mL. Chronically enlarged, heterogeneous uterus with clustered nodular echogenic foci near the uterine fundus. Endometrium Thickness: 9 mm.  No focal abnormality visualized. Right ovary Not visualized. Left ovary Not visualized. Other findings:  No abnormal free fluid. IMPRESSION: 1. Limited transabdominal pelvic ultrasound. 2. Chronically enlarged and heterogeneous uterus with diffuse adenomyosis diagnosed on prior MRI. 3. 9 mm endometrial thickness, less than on the 2018 MRI but still abnormal for age. In the setting of post-menopausal bleeding, endometrial sampling is indicated to exclude carcinoma. If results are benign, sonohysterogram should be considered for focal lesion work-up. (Ref: Radiological Reasoning: Algorithmic Workup of Abnormal Vaginal Bleeding with Endovaginal Sonography and Sonohysterography. AJR 2008; 631:S97-02) 4. Nonvisualization of the ovaries. Electronically Signed   By: Sebastian Ache M.D.   On: 01/04/2020 18:31   DG Chest Port 1 View  Result Date: 01/06/2020 CLINICAL DATA:  Acute respiratory failure with hypoxia EXAM: PORTABLE CHEST 1 VIEW COMPARISON:  Two days ago FINDINGS: Cardiomegaly with vascular pedicle widening primarily from mediastinal fat. There is vascular congestion and hazy opacity at the bases. No visible pneumothorax IMPRESSION: Cardiomegaly and vascular congestion. Atelectasis, pleural fluid, or infection could be present at the bases. Electronically Signed   By: Marnee Spring M.D.   On: 01/06/2020  05:17   DG CHEST PORT 1 VIEW  Result Date: 01/04/2020 CLINICAL DATA:  Tachypnea EXAM: PORTABLE CHEST 1 VIEW COMPARISON:  11/04/2017 FINDINGS: Cardiac shadow is enlarged. Increased vascular congestion is noted with interstitial edema consistent with congestive failure. No focal infiltrate is seen. No acute bony abnormality is noted. Degenerative changes of the thoracic spine are seen. IMPRESSION: Changes consistent with CHF. Electronically Signed   By: Alcide Clever M.D.   On: 01/04/2020 22:22   DG Foot Complete Left  Result Date: 01/04/2020 CLINICAL DATA:  Ulcer along the left heel and at the toes. EXAM: LEFT FOOT - COMPLETE 3+ VIEW COMPARISON:  Left foot radiographs 01/03/2012. FINDINGS: Postoperative changes of the distal tibia are again noted. Fractured screw is stable. Prominent plantar calcaneal spur is noted. Soft tissue ulceration is evident without penetration of the bone. No acute or focal osseous abnormalities are associated. Extensive soft tissue swelling is present over  the foot. Ulcerations involving the digits are not well appreciated due to overlap. No focal osseous erosion is evident. IMPRESSION: 1. Plantar ulceration at the calcaneus without acute osseous abnormality. 2. Prominent plantar spur at the calcaneus. 3. Extensive soft tissue swelling over the foot without other acute or focal osseous abnormality. Electronically Signed   By: Marin Robertshristopher  Mattern M.D.   On: 01/04/2020 09:22   ECHOCARDIOGRAM COMPLETE  Result Date: 01/05/2020    ECHOCARDIOGRAM REPORT   Patient Name:   Jasmine Osborn Date of Exam: 01/05/2020 Medical Rec #:  [id number]           Height:       64.0 in Accession #:    [id number]          Weight:       254.4 lb Date of Birth:  02/09/1951            BSA:          2.167 m Patient Age:    69 years            BP:           136/40 mmHg Patient Gender: F                   HR:           92 bpm. Exam Location:  [facility] Procedure: 2D Echo, Cardiac Doppler, Color Doppler and  Intracardiac            Opacification Agent Indications:     R94.31 Abnormla EKG  History:         Patient has prior history of Echocardiogram examinations, most                  recent 11/04/2017. Risk Factors:Hypertension, Dyslipidemia,                  Diabetes and Obesity. Obesity hypoventilation syndrome. Atrial                  Fibrillation.  Sonographer:     Sedonia SmallNaTashia Rodgers-Jones Referring Phys:  [id number] Lynn ItoSAHAR AMERY Diagnosing Phys: Alwyn Peawayne D Callwood MD IMPRESSIONS  1. Left ventricular ejection fraction, by estimation, is 65 to 70%. The left ventricle has normal function. The left ventricle has no regional wall motion abnormalities. Left ventricular diastolic parameters are consistent with Grade I diastolic dysfunction (impaired relaxation).  2. Right ventricular systolic function is normal. The right ventricular size is normal. There is moderately elevated pulmonary artery systolic pressure.  3. The mitral valve is normal in structure. Trivial mitral valve regurgitation.  4. The aortic valve is normal in structure. Aortic valve regurgitation is not visualized. FINDINGS  Left Ventricle: Left ventricular ejection fraction, by estimation, is 65 to 70%. The left ventricle has normal function. The left ventricle has no regional wall motion abnormalities. Definity contrast agent was given IV to delineate the left ventricular  endocardial borders. The left ventricular internal cavity size was normal in size. There is no left ventricular hypertrophy. Left ventricular diastolic parameters are consistent with Grade I diastolic dysfunction (impaired relaxation). Right Ventricle: The right ventricular size is normal. No increase in right ventricular wall thickness. Right ventricular systolic function is normal. There is moderately elevated pulmonary artery systolic pressure. The tricuspid regurgitant velocity is 2.96 m/s, and with an assumed right atrial pressure of 10 mmHg, the estimated right ventricular systolic  pressure is 45.0 mmHg. Left Atrium: Left atrial size was normal in size. Right Atrium:  Right atrial size was normal in size. Pericardium: There is no evidence of pericardial effusion. Mitral Valve: The mitral valve is normal in structure. Trivial mitral valve regurgitation. Tricuspid Valve: The tricuspid valve is normal in structure. Tricuspid valve regurgitation is mild. Aortic Valve: The aortic valve is normal in structure. Aortic valve regurgitation is not visualized. Aortic valve mean gradient measures 10.2 mmHg. Aortic valve peak gradient measures 16.0 mmHg. Aortic valve area, by VTI measures 1.86 cm. Pulmonic Valve: The pulmonic valve was normal in structure. Pulmonic valve regurgitation is not visualized. Aorta: The aortic root is normal in size and structure. IAS/Shunts: The atrial septum is grossly normal.  LEFT VENTRICLE PLAX 2D LVIDd:         4.56 cm  Diastology LVIDs:         2.79 cm  LV e' lateral:   7.40 cm/s LV PW:         1.03 cm  LV E/e' lateral: 19.1 LV IVS:        0.98 cm  LV e' medial:    5.55 cm/s LVOT diam:     1.80 cm  LV E/e' medial:  25.4 LV SV:         69 LV SV Index:   32 LVOT Area:     2.54 cm  RIGHT VENTRICLE RV Basal diam:  3.43 cm RV S prime:     14.10 cm/s TAPSE (M-mode): 2.3 cm LEFT ATRIUM             Index       RIGHT ATRIUM           Index LA diam:        4.10 cm 1.89 cm/m  RA Area:     15.80 cm LA Vol (A2C):   61.3 ml 28.29 ml/m RA Volume:   42.20 ml  19.47 ml/m LA Vol (A4C):   49.2 ml 22.70 ml/m LA Biplane Vol: 56.6 ml 26.12 ml/m  AORTIC VALVE AV Area (Vmax):    1.77 cm AV Area (Vmean):   1.72 cm AV Area (VTI):     1.86 cm AV Vmax:           199.75 cm/s AV Vmean:          153.500 cm/s AV VTI:            0.371 m AV Peak Grad:      16.0 mmHg AV Mean Grad:      10.2 mmHg LVOT Vmax:         139.00 cm/s LVOT Vmean:        104.000 cm/s LVOT VTI:          0.271 m LVOT/AV VTI ratio: 0.73  AORTA Ao Root diam: 2.90 cm MITRAL VALVE                TRICUSPID VALVE MV Area (PHT):  3.65 cm     TR Peak grad:   35.0 mmHg MV Decel Time: 208 msec     TR Vmax:        296.00 cm/s MV E velocity: 141.00 cm/s MV A velocity: 122.00 cm/s  SHUNTS MV E/A ratio:  1.16         Systemic VTI:  0.27 m                             Systemic Diam: 1.80 cm Alwyn Pea MD Electronically signed by Alwyn Pea  MD Signature Date/Time: 01/05/2020/10:58:40 PM    Final     Assessment & Plan     69 year old, caucasian, female with PMH significant for chronic atrial fibrillation (on eliquis), COPD with chronic respiratory failure, DM Type II, HTN, PVD with chronic stasis edema, morbid obesity, and depression who was admitted to ICU for symptomatic anemia secondary to excessive vaginal bleeding that was further complicated by chronic anticoagulant use.  1.  Atrial fibrillation-had been on Eliquis however due to bleeding, will need to defer this for now.  Had converted to A. fib with RVR.  This a.m. appears to be in sinus rhythm.  We will continue with metoprolol succinate and 50 mg daily and will start on low-dose p.o. Cardizem to attempt to maintain sinus rhythm.  We will start at 30 mg every 6.  2.  Anemia-hemoglobin 7.6 this a.m. down from 7.8.  As per above, will continue to defer chronic anticoagulation due to anemia and bleeding.  Will make further decision regarding long-term anticoagulation based on clinical course in the future.  3.  CHF-BNP decreased to 481  down from 559 on admission..  No clinical evidence of heart failure at present.  Has diuresed 1.378 L since admission.  Continue with torsemide following renal function, electrolytes and hemodynamics.  INR was 1.52 yesterday improved from 2.09 on admission.  Potassium is in normal range.  4.  Hypotension-we will follow for hemodynamic instability after adding Cardizem.   Signed, Darlin Priestly Shakiyah Cirilo MD 01/07/2020, 8:35 AM  Pager: [phone]

## 2020-01-07 NOTE — Progress Notes (Signed)
Physical Therapy Treatment Patient Details Name: Jasmine Osborn MRN: [id number] DOB: 11/21/1950 Today's Date: 01/07/2020    History of Present Illness Jasmine Osborn is 69 yo female that presented to ED for vaginal bleeding, SOB, and generalized weakness. Work up showed hemoglobin of 4, has had multiple transfusions, currently hemoglobin 7.8. Underwent D&C with heterscopy and IUD placement 3/31. PMH of afib, COPD, DM, morbid obesity, depression, HTN, PVD, chronic stasis edema, previous smoker.    PT Comments    Pt in recliner upon entry, agreeable to participate. Pt noted to have slight but insignificant drop in Hb overnight, still >7.0. Orthos established at beginning of session are negative, good response of pressures to positional changes, but HR response seems a bit exaggerated. Pt able to progress from 29ft (yesterday) AMB to 69ft this date, but has a sharp increase in HR (128) and RR (44). Vital recover well within 2 minutes sitting. Pt showing better balance in general. Pt assisted safely to [facility] after AMB for voiding. Pt able to manage pericare independently. Multiple transfers performed in session without device, but hand help support is offered during retroAMB due to balance challenge.   Follow Up Recommendations  SNF     Equipment Recommendations  None recommended by PT    Recommendations for Other Services       Precautions / Restrictions Precautions Precautions: Fall Restrictions Weight Bearing Restrictions: No    Mobility  Bed Mobility               General bed mobility comments: Up to chair at entry  Transfers Overall transfer level: Needs assistance Equipment used: None Transfers: Sit to/from Stand;Stand Pivot Transfers Sit to Stand: Supervision Stand pivot transfers: Supervision          Ambulation/Gait Ambulation/Gait assistance: Min guard Gait Distance (Feet): 24 Feet Assistive device: 1 person hand held assist;None       General Gait  Details: AMB forward is safe and steady without support, but cued for alternate retroAMB in room wherein balance deficits and pt confidence both take 'center stage' or 'steal the show'(after 27ft, pt has sharp incease in HR (128) and RR (44))   Stairs             Wheelchair Mobility    Modified Rankin (Stroke Patients Only)       Balance Overall balance assessment: Needs assistance Sitting-balance support: Feet supported;Feet unsupported;No upper extremity supported Sitting balance-Leahy Scale: Good     Standing balance support: No upper extremity supported;During functional activity Standing balance-Leahy Scale: Good Standing balance comment: unsteadiness of gait is realized with retroAMB             High level balance activites: Backward walking              Cognition Arousal/Alertness: Awake/alert Behavior During Therapy: WFL for tasks assessed/performed;Flat affect Overall Cognitive Status: Within Functional Limits for tasks assessed                                 General Comments: appears to have slightly delayed processing      Exercises Other Exercises Other Exercises: Pt educated re: log roll for abdominal comfort, importance of participation in therapy for improved functional strengthening and safe d/c, discharge recommendations, and energy conservation strategies Other Exercises: BUE THEREX seated in chair: (1 set x5 each side) overhead press, AAROM shoulder flexion, tricep extension utilizing recliner arm rests.     General  Comments        Pertinent Vitals/Pain Pain Assessment: No/denies pain    Home Living                      Prior Function            PT Goals (current goals can now be found in the care plan section) Acute Rehab PT Goals Patient Stated Goal: to get her strength back PT Goal Formulation: With patient Time For Goal Achievement: 01/18/20 Potential to Achieve Goals: Fair Progress towards PT  goals: Progressing toward goals    Frequency    Min 2X/week      PT Plan Current plan remains appropriate    Co-evaluation              AM-PAC PT "6 Clicks" Mobility   Outcome Measure  Help needed turning from your back to your side while in a flat bed without using bedrails?: A Little Help needed moving from lying on your back to sitting on the side of a flat bed without using bedrails?: A Little Help needed moving to and from a bed to a chair (including a wheelchair)?: A Little Help needed standing up from a chair using your arms (e.g., wheelchair or bedside chair)?: A Little Help needed to walk in hospital room?: A Little Help needed climbing 3-5 steps with a railing? : A Lot 6 Click Score: 17    End of Session Equipment Utilized During Treatment: Oxygen Activity Tolerance: Treatment limited secondary to medical complications (Comment);Patient tolerated treatment well(HR/RR high; orthos negative.) Patient left: in chair;with call bell/phone within reach;with chair alarm set   PT Visit Diagnosis: Other abnormalities of gait and mobility (R26.89);Muscle weakness (generalized) (M62.81)     Time: 2876-8115 PT Time Calculation (min) (ACUTE ONLY): 26 min  Charges:  $Therapeutic Exercise: 23-37 mins                     4:32 PM, 01/07/20 Rosamaria Lints, PT, DPT Physical Therapist - [facility]  [phone] (ASCOM)    Gwendolin Briel C 01/07/2020, 4:28 PM

## 2020-01-07 NOTE — Plan of Care (Signed)
  Problem: Education: Goal: Knowledge of General Education information will improve Description: Including pain rating scale, medication(s)/side effects and non-pharmacologic comfort measures Outcome: Progressing   Problem: Clinical Measurements: Goal: Diagnostic test results will improve Outcome: Progressing   

## 2020-01-07 NOTE — Progress Notes (Signed)
Discussed patients heart rate and blood pressure with E.Ouma. No acute changes noted. Will continue to monitor.

## 2020-01-07 NOTE — Plan of Care (Signed)

## 2020-01-07 NOTE — Progress Notes (Signed)
PROGRESS NOTE    Jasmine Osborn  XOV:[id number] DOB: [birth year] DOA: 01/03/2020 PCP: Kirk Ruths, Osborn    Brief Narrative:  Jasmine Osborn is a 69 y.o. female with medical history significant of chronic atrial fibrillation on anticoagulation, chronic respiratory failure due to COPD, diabetes, morbid obesity, depression, hypertension, peripheral vascular disease, chronic stasis edema who came to the ER secondary to excessive vaginal bleed.      Consultants:   GYN  Procedures: transfusion  Antimicrobials:   cefazolin   Subjective: Is better.  Her A. fib converted to normal sinus rhythm.  Husband at bedside.  Objective: Vitals:   01/07/20 0400 01/07/20 0535 01/07/20 0901 01/07/20 1152  BP:   (!) 146/50 (!) 114/51  Pulse:   68 93  Resp:  20 (!) 22   Temp:   98.8 F (37.1 C)   TempSrc:      SpO2:   97% 98%  Weight: 116 kg     Height:        Intake/Output Summary (Last 24 hours) at 01/07/2020 1518 Last data filed at 01/07/2020 1516 Gross per 24 hour  Intake 1240 ml  Output 250 ml  Net 990 ml   Filed Weights   01/05/20 0500 01/06/20 0346 01/07/20 0400  Weight: 115.4 kg 115.4 kg 116 kg    Examination:  General exam: Appears calm and comfortable , sitting in chair, nad, husband at bedside Respiratory system: minimal crackles Respiratory effort normal.  No wheezing Cardiovascular system: S1 & S2 heard, RRR. No murmurs, rubs, gallops or clicks.  Gastrointestinal system: Abdomen is ndistended, soft and nontender.. Normal bowel sounds heard. Foley in place Central nervous system: Awakens. Grossly intact  Extremities: decreased edema.  Skin: Warm dry Psychiatry: Mood & affect appropriate in current setting.     Data Reviewed: I have personally reviewed following labs and imaging studies  CBC: Recent Labs  Lab 01/04/20 0716 01/04/20 2337 01/05/20 0620 01/06/20 0458 01/07/20 0448  WBC 6.0 6.9 6.3 8.0 6.7  HGB 8.0* 8.3* 8.2* 7.8* 7.6*  HCT  27.6* 29.4* 28.5* 28.2* 27.4*  MCV 97.9 99.7 99.0 100.7* 102.2*  PLT 165 173 161 170 004   Basic Metabolic Panel: Recent Labs  Lab 01/03/20 1445 01/04/20 0716 01/05/20 0620 01/06/20 0458  NA 140 142 145 143  K 5.1 4.8 4.6 4.7  CL 109 110 110 107  CO2 '23 25 29 29  ' GLUCOSE 131* 97 101* 180*  BUN 30* 27* 19 21  CREATININE 2.09* 1.82* 1.58* 1.52*  CALCIUM 8.7* 8.6* 8.5* 8.6*  MG  --   --  2.0 2.1   GFR: Estimated Creatinine Clearance: 43.7 mL/min (A) (by C-G formula based on SCr of 1.52 mg/dL (H)). Liver Function Tests: Recent Labs  Lab 01/04/20 0716  AST 13*  ALT 12  ALKPHOS 35*  BILITOT 0.9  PROT 5.8*  ALBUMIN 2.9*   No results for input(s): LIPASE, AMYLASE in the last 168 hours. No results for input(s): AMMONIA in the last 168 hours. Coagulation Profile: Recent Labs  Lab 01/03/20 1448  INR 1.8*   Cardiac Enzymes: No results for input(s): CKTOTAL, CKMB, CKMBINDEX, TROPONINI in the last 168 hours. BNP (last 3 results) No results for input(s): PROBNP in the last 8760 hours. HbA1C: No results for input(s): HGBA1C in the last 72 hours. CBG: Recent Labs  Lab 01/03/20 2245  GLUCAP 140*   Lipid Profile: No results for input(s): CHOL, HDL, LDLCALC, TRIG, CHOLHDL, LDLDIRECT in the last 72 hours. Thyroid  Function Tests: No results for input(s): TSH, T4TOTAL, FREET4, T3FREE, THYROIDAB in the last 72 hours. Anemia Panel: No results for input(s): VITAMINB12, FOLATE, FERRITIN, TIBC, IRON, RETICCTPCT in the last 72 hours. Sepsis Labs: No results for input(s): PROCALCITON, LATICACIDVEN in the last 168 hours.  Recent Results (from the past 240 hour(s))  Respiratory Panel by RT PCR (Flu A&B, Covid) - Nasopharyngeal Swab     Status: None   Collection Time: 01/03/20  5:26 PM   Specimen: Nasopharyngeal Swab  Result Value Ref Range Status   SARS Coronavirus 2 by RT PCR NEGATIVE NEGATIVE Final    Comment: (NOTE) SARS-CoV-2 target nucleic acids are NOT DETECTED. The  SARS-CoV-2 RNA is generally detectable in upper respiratoy specimens during the acute phase of infection. The lowest concentration of SARS-CoV-2 viral copies this assay can detect is 131 copies/mL. A negative result does not preclude SARS-Cov-2 infection and should not be used as the sole basis for treatment or other patient management decisions. A negative result may occur with  improper specimen collection/handling, submission of specimen other than nasopharyngeal swab, presence of viral mutation(s) within the areas targeted by this assay, and inadequate number of viral copies (<131 copies/mL). A negative result must be combined with clinical observations, patient history, and epidemiological information. The expected result is Negative. Fact Sheet for Patients:  PinkCheek.be Fact Sheet for Healthcare Providers:  GravelBags.it This test is not yet ap proved or cleared by the [place] FDA and  has been authorized for detection and/or diagnosis of SARS-CoV-2 by FDA under an Emergency Use Authorization (EUA). This EUA will remain  in effect (meaning this test can be used) for the duration of the COVID-19 declaration under Section 564(b)(1) of the Act, 21 U.S.C. section 360bbb-3(b)(1), unless the authorization is terminated or revoked sooner.    Influenza A by PCR NEGATIVE NEGATIVE Final   Influenza B by PCR NEGATIVE NEGATIVE Final    Comment: (NOTE) The Xpert Xpress SARS-CoV-2/FLU/RSV assay is intended as an aid in  the diagnosis of influenza from Nasopharyngeal swab specimens and  should not be used as a sole basis for treatment. Nasal washings and  aspirates are unacceptable for Xpert Xpress SARS-CoV-2/FLU/RSV  testing. Fact Sheet for Patients: PinkCheek.be Fact Sheet for Healthcare Providers: GravelBags.it This test is not yet approved or cleared by the [place] FDA and  has been authorized for detection and/or diagnosis of SARS-CoV-2 by  FDA under an Emergency Use Authorization (EUA). This EUA will remain  in effect (meaning this test can be used) for the duration of the  Covid-19 declaration under Section 564(b)(1) of the Act, 21  U.S.C. section 360bbb-3(b)(1), unless the authorization is  terminated or revoked. Performed at [facility], [address] [place], [place] [address]   MRSA PCR Screening     Status: None   Collection Time: 01/03/20 10:54 PM   Specimen: Nasopharyngeal  Result Value Ref Range Status   MRSA by PCR NEGATIVE NEGATIVE Final    Comment:        The GeneXpert MRSA Assay (FDA approved for NASAL specimens only), is one component of a comprehensive MRSA colonization surveillance program. It is not intended to diagnose MRSA infection nor to guide or monitor treatment for MRSA infections. Performed at [facility], [address] [place], [place] [address]          Radiology Studies: [facility] Chest [place] 1 View  Result Date: 01/06/2020 CLINICAL DATA:  Acute respiratory failure with hypoxia EXAM: PORTABLE  CHEST 1 VIEW COMPARISON:  Two days ago FINDINGS: Cardiomegaly with vascular pedicle widening primarily from mediastinal fat. There is vascular congestion and hazy opacity at the bases. No visible pneumothorax IMPRESSION: Cardiomegaly and vascular congestion. Atelectasis, pleural fluid, or infection could be present at the bases. Electronically Signed   By: Monte Fantasia M.D.   On: 01/06/2020 05:17   ECHOCARDIOGRAM COMPLETE  Result Date: 01/05/2020    ECHOCARDIOGRAM REPORT   Patient Name:   Jasmine Osborn Date of Exam: 01/05/2020 Medical Rec #:  [id number]           Height:       64.0 in Accession #:    [id number]          Weight:       254.4 lb Date of Birth:  01/27/1951            BSA:          2.167 m Patient Age:    44 years            BP:           136/40 mmHg Patient Gender: F                    HR:           92 bpm. Exam Location:  [facility] Procedure: 2D Echo, Cardiac Doppler, Color Doppler and Intracardiac            Opacification Agent Indications:     R94.31 Abnormla EKG  History:         Patient has prior history of Echocardiogram examinations, most                  recent 11/04/2017. Risk Factors:Hypertension, Dyslipidemia,                  Diabetes and Obesity. Obesity hypoventilation syndrome. Atrial                  Fibrillation.  Sonographer:     Wilford Sports Rodgers-Jones Referring Phys:  [id number] Nolberto Hanlon Diagnosing Phys: Jasmine Osborn IMPRESSIONS  1. Left ventricular ejection fraction, by estimation, is 65 to 70%. The left ventricle has normal function. The left ventricle has no regional wall motion abnormalities. Left ventricular diastolic parameters are consistent with Grade I diastolic dysfunction (impaired relaxation).  2. Right ventricular systolic function is normal. The right ventricular size is normal. There is moderately elevated pulmonary artery systolic pressure.  3. The mitral valve is normal in structure. Trivial mitral valve regurgitation.  4. The aortic valve is normal in structure. Aortic valve regurgitation is not visualized. FINDINGS  Left Ventricle: Left ventricular ejection fraction, by estimation, is 65 to 70%. The left ventricle has normal function. The left ventricle has no regional wall motion abnormalities. Definity contrast agent was given IV to delineate the left ventricular  endocardial borders. The left ventricular internal cavity size was normal in size. There is no left ventricular hypertrophy. Left ventricular diastolic parameters are consistent with Grade I diastolic dysfunction (impaired relaxation). Right Ventricle: The right ventricular size is normal. No increase in right ventricular wall thickness. Right ventricular systolic function is normal. There is moderately elevated pulmonary artery systolic pressure. The tricuspid regurgitant velocity is 2.96  m/s, and with an assumed right atrial pressure of 10 mmHg, the estimated right ventricular systolic pressure is 60.6 mmHg. Left Atrium: Left atrial size was normal in size. Right Atrium: Right atrial size was normal  in size. Pericardium: There is no evidence of pericardial effusion. Mitral Valve: The mitral valve is normal in structure. Trivial mitral valve regurgitation. Tricuspid Valve: The tricuspid valve is normal in structure. Tricuspid valve regurgitation is mild. Aortic Valve: The aortic valve is normal in structure. Aortic valve regurgitation is not visualized. Aortic valve mean gradient measures 10.2 mmHg. Aortic valve peak gradient measures 16.0 mmHg. Aortic valve area, by VTI measures 1.86 cm. Pulmonic Valve: The pulmonic valve was normal in structure. Pulmonic valve regurgitation is not visualized. Aorta: The aortic root is normal in size and structure. IAS/Shunts: The atrial septum is grossly normal.  LEFT VENTRICLE PLAX 2D LVIDd:         4.56 cm  Diastology LVIDs:         2.79 cm  LV e' lateral:   7.40 cm/s LV PW:         1.03 cm  LV E/e' lateral: 19.1 LV IVS:        0.98 cm  LV e' medial:    5.55 cm/s LVOT diam:     1.80 cm  LV E/e' medial:  25.4 LV SV:         69 LV SV Index:   32 LVOT Area:     2.54 cm  RIGHT VENTRICLE RV Basal diam:  3.43 cm RV S prime:     14.10 cm/s TAPSE (M-mode): 2.3 cm LEFT ATRIUM             Index       RIGHT ATRIUM           Index LA diam:        4.10 cm 1.89 cm/m  RA Area:     15.80 cm LA Vol (A2C):   61.3 ml 28.29 ml/m RA Volume:   42.20 ml  19.47 ml/m LA Vol (A4C):   49.2 ml 22.70 ml/m LA Biplane Vol: 56.6 ml 26.12 ml/m  AORTIC VALVE AV Area (Vmax):    1.77 cm AV Area (Vmean):   1.72 cm AV Area (VTI):     1.86 cm AV Vmax:           199.75 cm/s AV Vmean:          153.500 cm/s AV VTI:            0.371 m AV Peak Grad:      16.0 mmHg AV Mean Grad:      10.2 mmHg LVOT Vmax:         139.00 cm/s LVOT Vmean:        104.000 cm/s LVOT VTI:          0.271 m LVOT/AV VTI  ratio: 0.73  AORTA Ao Root diam: 2.90 cm MITRAL VALVE                TRICUSPID VALVE MV Area (PHT): 3.65 cm     TR Peak grad:   35.0 mmHg MV Decel Time: 208 msec     TR Vmax:        296.00 cm/s MV E velocity: 141.00 cm/s MV A velocity: 122.00 cm/s  SHUNTS MV E/A ratio:  1.16         Systemic VTI:  0.27 m                             Systemic Diam: 1.80 cm Jasmine Osborn Electronically signed by Jasmine Osborn Signature Date/Time: 01/05/2020/10:58:40 PM  Final         Scheduled Meds: . sodium chloride   Intravenous Once  . apixaban  5 mg Oral BID  . chlorhexidine  15 mL Mouth Rinse BID  . Chlorhexidine Gluconate Cloth  6 each Topical Q0600  . collagenase   Topical Daily  . cyanocobalamin  1,000 mcg Intramuscular Daily  . diltiazem  30 mg Oral Q6H  . fenofibrate  160 mg Oral Daily  . latanoprost  1 drop Both Eyes QHS  . mouth rinse  15 mL Mouth Rinse q12n4p  . metoprolol succinate  50 mg Oral Daily  . mirtazapine  30 mg Oral QHS  . multivitamin-lutein  1 capsule Oral Daily  . potassium chloride SA  20 mEq Oral Daily  . risperiDONE  1 mg Oral QHS  . torsemide  20 mg Oral Daily  . tranexamic acid  1,300 mg Oral TID   Continuous Infusions: . sodium chloride 10 mL/hr at 01/06/20 0300  .  ceFAZolin (ANCEF) IV Stopped (01/07/20 1516)    Assessment & Plan:   Principal Problem:   Symptomatic anemia Active Problems:   Persistent atrial fibrillation (HCC)   Morbid obesity (HCC)   Vaginal bleeding   ARF (acute renal failure) (HCC)   Cellulitis of left lower extremity   Skin ulcer of toe of left foot, limited to breakdown of skin (HCC)   AF (paroxysmal atrial fibrillation) ([place])   1. Symptomatic anemia with a hemoglobin of 4.2. Patient given transfusions (3 units of packed red blood cells) and hemoglobin up to 8 today.  Secondary to vaginal bleeding.  Status dilatation and curettage/hysteroscopy , vaginal bleed resolved. FYN following. D/c foley.progesterone IUD is in  place . Per Gyn, stop Megace on discharge. 2. Iron deficiency anemia -given  IV iron  3. Vitamin B12 deficiency give B12 injections for 3 days 4. Acute on chronic kidney injury continue to monitor. close to baselineHold losartan for now.  5. 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 Echo with nml EF.cards. mildly vol. Overloaded, will give one time dose iv lasix 36m . Continue Torsemide there after. 6. NSVT- likely 2/2 anemia, chf.  On beta blk. Echo nml ef. Cards following. Continue beta blk. 7. Essential hypertension. On metoprolol 8. Left lower extremity ulcer with surrounding cellulitis started Ancef. X-ray negative for osteomyelitis. Check an ESR. Wound care consult. 9. COPD with chronic respiratory failure on 3 L of oxygen  10.paroxysmal atrial fibrillation -Per GYN Dr. BLeafy Rovia secure chat, can restart  Eliquis 24hrs post surgery. Will start today , monitor for bleed. Rate better, now in nsr. Cards started po cardizem [id number] 11.PT/OT: snf and continue beta blk.    DVT prophylaxis: eliquis started Code Status: Full Family Communication: husband updated at bedside Disposition Plan: SNF when medically stable. Barrier: Need to find SNF.had afib rvr, trying to manage HR.      LOS: 4 days   Time spent: 45 minutes with more than 50% on COHatleyMD [facility] Pager [phone]  If 7PM-7AM, please contact night-coverage [email or web address] Password [email or web address]/11/2019, 3:18 PM Patient ID: DaJolee Ewingfemale   DOB: 11/1950-06-0669[birth year].o.   MRN: [id number]

## 2020-01-07 NOTE — Anesthesia Postprocedure Evaluation (Signed)
Anesthesia Post Note  Patient: Jasmine Osborn  Procedure(s) Performed: DILATATION & CURETTAGE/HYSTEROSCOPY WITH NOVASURE ABLATION (N/A Uterus)  Patient location during evaluation: ICU Anesthesia Type: General Level of consciousness: awake and alert and oriented Pain management: pain level controlled Vital Signs Assessment: post-procedure vital signs reviewed and stable Respiratory status: spontaneous breathing, nonlabored ventilation and respiratory function stable Cardiovascular status: blood pressure returned to baseline and stable Postop Assessment: no signs of nausea or vomiting Anesthetic complications: no     Last Vitals:  Vitals:   01/07/20 0333 01/07/20 0535  BP: 119/60   Pulse: (!) 138   Resp: 20 20  Temp: 36.7 C   SpO2: 97%     Last Pain:  Vitals:   01/07/20 0333  TempSrc: Oral  PainSc:                  Rikki Smestad

## 2020-01-07 NOTE — Progress Notes (Signed)
Occupational Therapy Treatment Patient Details Name: Jasmine Osborn MRN: [id number] DOB: Oct 04, 1951 Today's Date: 01/07/2020    History of present illness Pt is 69 yo female that presented to ED for vaginal bleeding, SOB, and generalized weakness. Work up showed hemoglobin of 4, has had multiple transfusions, currently hemoglobin 7.8. Underwent D&C with heterscopy and IUD placement 3/31. PMH of afib, COPD, DM, morbid obesity, depression, HTN, PVD, chronic stasis edema, previous smoker.   OT comments  Ms Waskey was seen for OT treatment on this date. Upon arrival to room pt was awake and reporting no pain. Of note, upon arrival pt was found seated upright in chair c no chair alarm box in room (per [facility] chair alarm box was missing on 01/06/20 at time of OT evaluation). OT obtained chair alarm and activated at end of session. Pt reports being unsure about discharge recommendations and required encouragement to participate in tx. Pt instructed in log roll for abdominal comfort, importance of participation in therapy for improved functional strengthening, discharge recommendations, and energy conservation strategies. Pt engaged in BUE THEREX seated in chair (1 set x 5 reps each): overhead press, active assisted shoulder flexion, and tricep extension utilizing recliner arm rests. Pt verbalized understanding of instruction provided. Pt making progress toward goals. Pt continues to benefit from skilled OT services to maximize return to PLOF and minimize risk of future falls, injury, caregiver burden, and readmission. Will continue to follow POC. Discharge recommendation remains appropriate.    Follow Up Recommendations  SNF    Equipment Recommendations  3 in 1 bedside commode(bariatric)    Recommendations for Other Services      Precautions / Restrictions Precautions Precautions: Fall       Mobility Bed Mobility                  Transfers                      Balance                                           ADL either performed or assessed with clinical judgement   ADL Overall ADL's : Needs assistance/impaired                                             Vision       Perception     Praxis      Cognition Arousal/Alertness: Awake/alert Behavior During Therapy: WFL for tasks assessed/performed Overall Cognitive Status: Within Functional Limits for tasks assessed                                 General Comments: appears to have slightly delayed processing        Exercises Other Exercises Other Exercises: Pt educated re: log roll for abdominal comfort, importance of participation in therapy for improved functional strengthening and safe d/c, discharge recommendations, and energy conservation strategies Other Exercises: BUE THEREX seated in chair: (1 set x5 each side) overhead press, AAROM shoulder flexion, tricep extension utilizing recliner arm rests.    Shoulder Instructions       General Comments      Pertinent Vitals/ Pain  Pain Assessment: No/denies pain  Home Living                                          Prior Functioning/Environment              Frequency  Min 2X/week        Progress Toward Goals  OT Goals(current goals can now be found in the care plan section)  Progress towards OT goals: Progressing toward goals  Acute Rehab OT Goals Patient Stated Goal: to get her strength back OT Goal Formulation: With patient Time For Goal Achievement: 01/20/20 Potential to Achieve Goals: Good ADL Goals Pt Will Perform Lower Body Dressing: with min guard assist;sit to/from stand;with adaptive equipment Pt Will Transfer to Toilet: with supervision;ambulating;bedside commode  Plan Discharge plan remains appropriate;Frequency remains appropriate    Co-evaluation                 AM-PAC OT "6 Clicks" Daily Activity     Outcome Measure   Help  from another person eating meals?: None Help from another person taking care of personal grooming?: None Help from another person toileting, which includes using toliet, bedpan, or urinal?: A Lot Help from another person bathing (including washing, rinsing, drying)?: A Little Help from another person to put on and taking off regular upper body clothing?: A Little Help from another person to put on and taking off regular lower body clothing?: A Lot 6 Click Score: 18    End of Session    OT Visit Diagnosis: Other abnormalities of gait and mobility (R26.89);Muscle weakness (generalized) (M62.81)   Activity Tolerance Patient limited by fatigue   Patient Left in chair;with call bell/phone within reach;with chair alarm set   Nurse Communication Other (comment)(OT provided chair alarm box)        Time: 1155-1210 OT Time Calculation (min): 15 min  Charges: OT General Charges $OT Visit: 1 Visit OT Treatments $Self Care/Home Management : 8-22 mins  Dessie Coma, M.S. OTR/L  01/07/20, 1:12 PM

## 2020-01-07 NOTE — TOC Progression Note (Signed)
Transition of Care [facility]) - Progression Note    Patient Details  Name: Jasmine Osborn MRN: [id number] Date of Birth: 1951-06-08  Transition of Care [facility]) CM/SW Contact  Shawn Route, RN Phone Number: 01/07/2020, 11:31 AM  Clinical Narrative:     Spoke with Husband he would have liked to take patient home instead of short term rehab.  After speaking with PT, he understands importance of therapy.  Per MD, patient should be ready for discharge Saturday.  Talked with Inetta Fermo at [facility] and she is verifying to see if authorization has been completed by Monia Pouch and will return my call.   Expected Discharge Plan: Skilled Nursing Facility Barriers to Discharge: Continued Medical Work up  Expected Discharge Plan and Services Expected Discharge Plan: Skilled Nursing Facility     Post Acute Care Choice: Skilled Nursing Facility                                         Social Determinants of Health (SDOH) Interventions    Readmission Risk Interventions No flowsheet data found.

## 2020-01-08 LAB — BLOOD GAS, VENOUS
Acid-Base Excess: 1.6 mmol/L (ref 0.0–2.0)
Bicarbonate: 29.8 mmol/L — ABNORMAL HIGH (ref 20.0–28.0)
Delivery systems: POSITIVE
FIO2: 0.3
O2 Saturation: 41.8 %
Patient temperature: 37
pCO2, Ven: 62 mmHg — ABNORMAL HIGH (ref 44.0–60.0)
pH, Ven: 7.29 (ref 7.250–7.430)

## 2020-01-08 LAB — BASIC METABOLIC PANEL
Anion gap: 6 (ref 5–15)
BUN: 26 mg/dL — ABNORMAL HIGH (ref 8–23)
CO2: 30 mmol/L (ref 22–32)
Calcium: 8.5 mg/dL — ABNORMAL LOW (ref 8.9–10.3)
Chloride: 107 mmol/L (ref 98–111)
Creatinine, Ser: 1.38 mg/dL — ABNORMAL HIGH (ref 0.44–1.00)
GFR calc Af Amer: 45 mL/min — ABNORMAL LOW (ref >60)
GFR calc non Af Amer: 39 mL/min — ABNORMAL LOW (ref >60)
Glucose, Bld: 100 mg/dL — ABNORMAL HIGH (ref 70–99)
Potassium: 4.2 mmol/L (ref 3.5–5.1)
Sodium: 143 mmol/L (ref 135–145)

## 2020-01-08 LAB — RESPIRATORY PANEL BY RT PCR (FLU A&B, COVID)
Influenza A by PCR: NEGATIVE
Influenza B by PCR: NEGATIVE
SARS Coronavirus 2 by RT PCR: NEGATIVE

## 2020-01-08 MED ORDER — TRANEXAMIC ACID 650 MG PO TABS: 1300.0000 mg | ORAL_TABLET | Freq: Three times a day (TID) | ORAL | Status: AC

## 2020-01-08 MED ORDER — VITAMIN B-12 1000 MCG PO TABS: 1000.0000 ug | ORAL_TABLET | Freq: Every day | ORAL | 0 refills | Status: AC

## 2020-01-08 MED ORDER — COLLAGENASE 250 UNIT/GM EX OINT: TOPICAL_OINTMENT | Freq: Every day | CUTANEOUS | 0 refills | Status: AC

## 2020-01-08 MED ORDER — CEPHALEXIN 500 MG PO CAPS
500.0000 mg | ORAL_CAPSULE | Freq: Three times a day (TID) | ORAL | Status: DC
  Administered 2020-01-08 – 2020-01-10 (×6): 500 mg via ORAL
  Filled 2020-01-08 (×6): qty 1

## 2020-01-08 MED ORDER — CEPHALEXIN 500 MG PO CAPS: 500.0000 mg | ORAL_CAPSULE | Freq: Three times a day (TID) | ORAL | 0 refills | Status: AC

## 2020-01-08 MED ORDER — DILTIAZEM HCL ER COATED BEADS 120 MG PO CP24: 120.0000 mg | ORAL_CAPSULE | Freq: Every day | ORAL | 0 refills | Status: AC

## 2020-01-08 MED ORDER — DILTIAZEM HCL ER COATED BEADS 120 MG PO CP24
120.0000 mg | ORAL_CAPSULE | Freq: Every day | ORAL | Status: DC
  Administered 2020-01-08 – 2020-01-10 (×3): 120 mg via ORAL
  Filled 2020-01-08 (×3): qty 1

## 2020-01-08 NOTE — TOC Progression Note (Cosign Needed)
Transition of Care [facility]) - Progression Note    Patient Details  Name: Jasmine Osborn MRN: [id number] Date of Birth: January 02, 1951  Transition of Care [facility]) CM/SW Contact  [address], [place], [place] Phone Number: 01/08/2020, 2:42 PM  Clinical Narrative:    Patient's discharged cancelled. [facility] informed. Transport cancelled.   Lenn Volker, LCSW Clinical Social Work [phone]    Expected Discharge Plan: Skilled Nursing Facility Barriers to Discharge: Continued Medical Work up  Expected Discharge Plan and Services Expected Discharge Plan: Skilled Nursing Facility     Post Acute Care Choice: Skilled Nursing Facility                                         Social Determinants of Health (SDOH) Interventions    Readmission Risk Interventions No flowsheet data found.

## 2020-01-08 NOTE — Progress Notes (Signed)
1420--received phone call from CCMD that pt's HR was in the 180's.  Assessed pt; denied SOB, CP, NV.  HR was SVT 180's.  Had pt perform valsalva maneuver; HR down to 140's. Dr. Denny Peon paged. 1424--spoke with MD regarding situation.  VS completed. 12-lead EKG performed.  Cardizem CD PO given. 1445--HR 95. SR with PACs.  MD updated.  CM contacted to cancel d/c.

## 2020-01-08 NOTE — Discharge Instructions (Signed)
Anemia  Anemia is a condition in which you do not have enough red blood cells or hemoglobin. Hemoglobin is a substance in red blood cells that carries oxygen. When you do not have enough red blood cells or hemoglobin (are anemic), your body cannot get enough oxygen and your organs may not work properly. As a result, you may feel very tired or have other problems. What are the causes? Common causes of anemia include:  Excessive bleeding. Anemia can be caused by excessive bleeding inside or outside the body, including bleeding from the intestine or from periods in women.  Poor nutrition.  Long-lasting (chronic) kidney, thyroid, and liver disease.  Bone marrow disorders.  Cancer and treatments for cancer.  HIV (human immunodeficiency virus) and AIDS (acquired immunodeficiency syndrome).  Treatments for HIV and AIDS.  Spleen problems.  Blood disorders.  Infections, medicines, and autoimmune disorders that destroy red blood cells. What are the signs or symptoms? Symptoms of this condition include:  Minor weakness.  Dizziness.  Headache.  Feeling heartbeats that are irregular or faster than normal (palpitations).  Shortness of breath, especially with exercise.  Paleness.  Cold sensitivity.  Indigestion.  Nausea.  Difficulty sleeping.  Difficulty concentrating. Symptoms may occur suddenly or develop slowly. If your anemia is mild, you may not have symptoms. How is this diagnosed? This condition is diagnosed based on:  Blood tests.  Your medical history.  A physical exam.  Bone marrow biopsy. Your health care provider may also check your stool (feces) for blood and may do additional testing to look for the cause of your bleeding. You may also have other tests, including:  Imaging tests, such as a CT scan or MRI.  Endoscopy.  Colonoscopy. How is this treated? Treatment for this condition depends on the cause. If you continue to lose a lot of blood, you may  need to be treated at a hospital. Treatment may include:  Taking supplements of iron, vitamin S31, or folic acid.  Taking a hormone medicine (erythropoietin) that can help to stimulate red blood cell growth.  Having a blood transfusion. This may be needed if you lose a lot of blood.  Making changes to your diet.  Having surgery to remove your spleen. Follow these instructions at home:  Take over-the-counter and prescription medicines only as told by your health care provider.  Take supplements only as told by your health care provider.  Follow any diet instructions that you were given.  Keep all follow-up visits as told by your health care provider. This is important. Contact a health care provider if:  You develop new bleeding anywhere in the body. Get help right away if:  You are very weak.  You are short of breath.  You have pain in your abdomen or chest.  You are dizzy or feel faint.  You have trouble concentrating.  You have bloody or black, tarry stools.  You vomit repeatedly or you vomit up blood. Summary  Anemia is a condition in which you do not have enough red blood cells or enough of a substance in your red blood cells that carries oxygen (hemoglobin).  Symptoms may occur suddenly or develop slowly.  If your anemia is mild, you may not have symptoms.  This condition is diagnosed with blood tests as well as a medical history and physical exam. Other tests may be needed.  Treatment for this condition depends on the cause of the anemia. This information is not intended to replace advice given to you by  your health care provider. Make sure you discuss any questions you have with your health care provider. Document Revised: 09/05/2017 Document Reviewed: 10/25/2016 Elsevier Patient Education  [employer].

## 2020-01-08 NOTE — TOC Progression Note (Signed)
Transition of Care [facility]) - Discharge Note     Patient Details  Name: Jasmine Osborn MRN: [id number] Date of Birth: [birth year]  Transition of [facility]) CM/SW Contact  Shamina Etheridge, [place], [place] Phone Number: 01/08/2020, 11:54 AM  Clinical Narrative:    Patient ot discharge to [facility] today by EMS. Patient will be going to room 606. Report to be called in to the [address] Nurse [phone]. Discharge summary to be faxed to [phone].  Laurice Kimmons, LCSW Clinical Social Work [phone]   Expected Discharge Plan: Skilled Nursing Facility Barriers to Discharge: Continued Medical Work up  Expected Discharge Plan and Services Expected Discharge Plan: Skilled Nursing Facility     Post Acute Care Choice: Skilled Nursing Facility                                         Social Determinants of Health (SDOH) Interventions    Readmission Risk Interventions No flowsheet data found.

## 2020-01-08 NOTE — Plan of Care (Signed)

## 2020-01-08 NOTE — Progress Notes (Addendum)
Pt was more sleepier today compare yesterday. Pt will answer question when asked and will take medicine, but after taking medicines pt will close eyes and go back to sleep. Notify prime. Will continue to monitor.  Update 2330: Talked to [facility] NP and states will assessed pt. No ordered to just monitor pt at this time. Will continue to monitor.  Update 0439: CCMD called an reported that pt just have a non sustain SVT 15 beats.pt was asleep at this time

## 2020-01-08 NOTE — Progress Notes (Signed)
Patient ID: Jasmine Osborn, female   DOB: January 13, 1951, 69 y.o.   MRN: [id number] Discharge cancelled b/c pt went to afib [facility].

## 2020-01-08 NOTE — Progress Notes (Signed)
Patient Name: Jasmine Osborn Date of Encounter: 01/08/2020  Hospital Problem List     Principal Problem:   Symptomatic anemia Active Problems:   Persistent atrial fibrillation (HCC)   Morbid obesity (HCC)   Vaginal bleeding   ARF (acute renal failure) (HCC)   Cellulitis of left lower extremity   Skin ulcer of toe of left foot, limited to breakdown of skin (HCC)   AF (paroxysmal atrial fibrillation) [facility])    Patient Profile     69 year old, caucasian, female with PMH significant for chronic atrial fibrillation (on eliquis), COPD with chronic respiratory failure, DM Type II, HTN, PVD with chronic stasis edema, morbid obesity, and depression who was admitted to [facility] for symptomatic anemia secondary to excessive vaginal bleeding that was further complicated by chronic anticoagulant use.  Subjective   Currently in sinus rhythm.  Difficult historian.  Runs of wide-complex tachycardia over the past 24 hours noted.  Given the lack of axis change, likely an aberrant atrial fibrillation however ventricular arrhythmia cannot be completely ruled out given clinical history, the former is likely the case.  Nonsustained.  Currently converted to sinus rhythm by telemetry  Inpatient Medications    . sodium chloride   Intravenous Once  . apixaban  5 mg Oral BID  . chlorhexidine  15 mL Mouth Rinse BID  . Chlorhexidine Gluconate Cloth  6 each Topical Q0600  . collagenase   Topical Daily  . cyanocobalamin  1,000 mcg Intramuscular Daily  . diltiazem  30 mg Oral Q6H  . fenofibrate  160 mg Oral Daily  . latanoprost  1 drop Both Eyes QHS  . mouth rinse  15 mL Mouth Rinse q12n4p  . metoprolol succinate  50 mg Oral Daily  . mirtazapine  30 mg Oral QHS  . multivitamin-lutein  1 capsule Oral Daily  . potassium chloride SA  20 mEq Oral Daily  . risperiDONE  1 mg Oral QHS  . torsemide  20 mg Oral Daily  . tranexamic acid  1,300 mg Oral TID    Vital Signs    Vitals:   01/08/20 0146 01/08/20  0533 01/08/20 0632 01/08/20 0806  BP: (!) 138/59 (!) 139/58 138/65 (!) 146/52  Pulse: 91 97  (!) 102  Resp:    18  Temp: 98.1 F (36.7 C) 98.5 F (36.9 C) 98.3 F (36.8 C) 98.7 F (37.1 C)  TempSrc: Oral Oral Oral Oral  SpO2: 93% 96% 95% 93%  Weight:  118.1 kg    Height:        Intake/Output Summary (Last 24 hours) at 01/08/2020 1010 Last data filed at 01/08/2020 0549 Gross per 24 hour  Intake 1488.02 ml  Output 750 ml  Net 738.02 ml   Filed Weights   01/06/20 0346 01/07/20 0400 01/08/20 0533  Weight: 115.4 kg 116 kg 118.1 kg    Physical Exam    GEN: Well nourished, well developed, in no acute distress.  HEENT: normal.  Neck: Supple, no JVD, carotid bruits, or masses. Cardiac: Irregular regular rhythm. Respiratory:  Respirations regular and unlabored, clear to auscultation bilaterally. GI: Soft, nontender, nondistended, BS + x 4. MS: no deformity or atrophy. Skin: warm and dry, lower extremity edema and ulcerations.  Cellulitis present. .  Labs    CBC Recent Labs    01/06/20 0458 01/07/20 0448  WBC 8.0 6.7  HGB 7.8* 7.6*  HCT 28.2* 27.4*  MCV 100.7* 102.2*  PLT 170 164   Basic Metabolic Panel Recent Labs  01/06/20 0458 01/08/20 0437  NA 143 143  K 4.7 4.2  CL 107 107  CO2 29 30  GLUCOSE 180* 100*  BUN 21 26*  CREATININE 1.52* 1.38*  CALCIUM 8.6* 8.5*  MG 2.1  --    Liver Function Tests No results for input(s): AST, ALT, ALKPHOS, BILITOT, PROT, ALBUMIN in the last 72 hours. No results for input(s): LIPASE, AMYLASE in the last 72 hours. Cardiac Enzymes No results for input(s): CKTOTAL, CKMB, CKMBINDEX, TROPONINI in the last 72 hours. BNP Recent Labs    01/06/20 0458  BNP 481.0*   D-Dimer No results for input(s): DDIMER in the last 72 hours. Hemoglobin A1C No results for input(s): HGBA1C in the last 72 hours. Fasting Lipid Panel No results for input(s): CHOL, HDL, LDLCALC, TRIG, CHOLHDL, LDLDIRECT in the last 72 hours. Thyroid Function  Tests No results for input(s): TSH, T4TOTAL, T3FREE, THYROIDAB in the last 72 hours.  Invalid input(s): FREET3  Telemetry    Currently sinus rhythm.  Last 24 hours atrial fibrillation with variable but occasionally rapid ventricular response with probable aberrant nonsustained A. fib versus ventricular rhythm.  ECG    A. fib with RVR  Radiology    US PELVIS (TRANSABDOMINAL ONLY)  Result Date: 01/04/2020 CLINICAL DATA:  Menorrhagia.  Severe anemia. EXAM: TRANSABDOMINAL ULTRASOUND OF PELVIS TECHNIQUE: Transabdominal ultrasound examination of the pelvis was performed including evaluation of the uterus, ovaries, adnexal regions, and pelvic cul-de-sac. COMPARISON:  Pelvic MRI 05/08/2017. CT abdomen and pelvis 07/14/2017. Pelvic ultrasound 07/27/2010. FINDINGS: Examination is limited by patient body habitus. A transvaginal examination could not be performed. Uterus Measurements: 14.9 x 8.3 x 9.1 cm = volume: 589 mL. Chronically enlarged, heterogeneous uterus with clustered nodular echogenic foci near the uterine fundus. Endometrium Thickness: 9 mm.  No focal abnormality visualized. Right ovary Not visualized. Left ovary Not visualized. Other findings:  No abnormal free fluid. IMPRESSION: 1. Limited transabdominal pelvic ultrasound. 2. Chronically enlarged and heterogeneous uterus with diffuse adenomyosis diagnosed on prior MRI. 3. 9 mm endometrial thickness, less than on the 2018 MRI but still abnormal for age. In the setting of post-menopausal bleeding, endometrial sampling is indicated to exclude carcinoma. If results are benign, sonohysterogram should be considered for focal lesion work-up. (Ref: Radiological Reasoning: Algorithmic Workup of Abnormal Vaginal Bleeding with Endovaginal Sonography and Sonohysterography. AJR 2008; 188:C16-60) 4. Nonvisualization of the ovaries. Electronically Signed   By: Sebastian Ache M.D.   On: 01/04/2020 18:31   DG Chest Port 1 View  Result Date: 01/06/2020 CLINICAL  DATA:  Acute respiratory failure with hypoxia EXAM: PORTABLE CHEST 1 VIEW COMPARISON:  Two days ago FINDINGS: Cardiomegaly with vascular pedicle widening primarily from mediastinal fat. There is vascular congestion and hazy opacity at the bases. No visible pneumothorax IMPRESSION: Cardiomegaly and vascular congestion. Atelectasis, pleural fluid, or infection could be present at the bases. Electronically Signed   By: Marnee Spring M.D.   On: 01/06/2020 05:17   DG CHEST PORT 1 VIEW  Result Date: 01/04/2020 CLINICAL DATA:  Tachypnea EXAM: PORTABLE CHEST 1 VIEW COMPARISON:  11/04/2017 FINDINGS: Cardiac shadow is enlarged. Increased vascular congestion is noted with interstitial edema consistent with congestive failure. No focal infiltrate is seen. No acute bony abnormality is noted. Degenerative changes of the thoracic spine are seen. IMPRESSION: Changes consistent with CHF. Electronically Signed   By: Alcide Clever M.D.   On: 01/04/2020 22:22   DG Foot Complete Left  Result Date: 01/04/2020 CLINICAL DATA:  Ulcer along the left heel and  at the toes. EXAM: LEFT FOOT - COMPLETE 3+ VIEW COMPARISON:  Left foot radiographs 01/03/2012. FINDINGS: Postoperative changes of the distal tibia are again noted. Fractured screw is stable. Prominent plantar calcaneal spur is noted. Soft tissue ulceration is evident without penetration of the bone. No acute or focal osseous abnormalities are associated. Extensive soft tissue swelling is present over the foot. Ulcerations involving the digits are not well appreciated due to overlap. No focal osseous erosion is evident. IMPRESSION: 1. Plantar ulceration at the calcaneus without acute osseous abnormality. 2. Prominent plantar spur at the calcaneus. 3. Extensive soft tissue swelling over the foot without other acute or focal osseous abnormality. Electronically Signed   By: Marin Roberts M.D.   On: 01/04/2020 09:22   ECHOCARDIOGRAM COMPLETE  Result Date: 01/05/2020     ECHOCARDIOGRAM REPORT   Patient Name:   KENOSHA DOSTER Date of Exam: 01/05/2020 Medical Rec #:  [id number]           Height:       64.0 in Accession #:    [id number]          Weight:       254.4 lb Date of Birth:  October 01, 1951            BSA:          2.167 m Patient Age:    69 years            BP:           136/40 mmHg Patient Gender: F                   HR:           92 bpm. Exam Location:  [facility] Procedure: 2D Echo, Cardiac Doppler, Color Doppler and Intracardiac            Opacification Agent Indications:     R94.31 Abnormla EKG  History:         Patient has prior history of Echocardiogram examinations, most                  recent 11/04/2017. Risk Factors:Hypertension, Dyslipidemia,                  Diabetes and Obesity. Obesity hypoventilation syndrome. Atrial                  Fibrillation.  Sonographer:     Sedonia Small Rodgers-Jones Referring Phys:  [id number] Lynn Ito Diagnosing Phys: Alwyn Pea MD IMPRESSIONS  1. Left ventricular ejection fraction, by estimation, is 65 to 70%. The left ventricle has normal function. The left ventricle has no regional wall motion abnormalities. Left ventricular diastolic parameters are consistent with Grade I diastolic dysfunction (impaired relaxation).  2. Right ventricular systolic function is normal. The right ventricular size is normal. There is moderately elevated pulmonary artery systolic pressure.  3. The mitral valve is normal in structure. Trivial mitral valve regurgitation.  4. The aortic valve is normal in structure. Aortic valve regurgitation is not visualized. FINDINGS  Left Ventricle: Left ventricular ejection fraction, by estimation, is 65 to 70%. The left ventricle has normal function. The left ventricle has no regional wall motion abnormalities. Definity contrast agent was given IV to delineate the left ventricular  endocardial borders. The left ventricular internal cavity size was normal in size. There is no left ventricular hypertrophy. Left ventricular  diastolic parameters are consistent with Grade I diastolic dysfunction (impaired relaxation). Right Ventricle: The right ventricular  size is normal. No increase in right ventricular wall thickness. Right ventricular systolic function is normal. There is moderately elevated pulmonary artery systolic pressure. The tricuspid regurgitant velocity is 2.96 m/s, and with an assumed right atrial pressure of 10 mmHg, the estimated right ventricular systolic pressure is 16.1 mmHg. Left Atrium: Left atrial size was normal in size. Right Atrium: Right atrial size was normal in size. Pericardium: There is no evidence of pericardial effusion. Mitral Valve: The mitral valve is normal in structure. Trivial mitral valve regurgitation. Tricuspid Valve: The tricuspid valve is normal in structure. Tricuspid valve regurgitation is mild. Aortic Valve: The aortic valve is normal in structure. Aortic valve regurgitation is not visualized. Aortic valve mean gradient measures 10.2 mmHg. Aortic valve peak gradient measures 16.0 mmHg. Aortic valve area, by VTI measures 1.86 cm. Pulmonic Valve: The pulmonic valve was normal in structure. Pulmonic valve regurgitation is not visualized. Aorta: The aortic root is normal in size and structure. IAS/Shunts: The atrial septum is grossly normal.  LEFT VENTRICLE PLAX 2D LVIDd:         4.56 cm  Diastology LVIDs:         2.79 cm  LV e' lateral:   7.40 cm/s LV PW:         1.03 cm  LV E/e' lateral: 19.1 LV IVS:        0.98 cm  LV e' medial:    5.55 cm/s LVOT diam:     1.80 cm  LV E/e' medial:  25.4 LV SV:         69 LV SV Index:   32 LVOT Area:     2.54 cm  RIGHT VENTRICLE RV Basal diam:  3.43 cm RV S prime:     14.10 cm/s TAPSE (M-mode): 2.3 cm LEFT ATRIUM             Index       RIGHT ATRIUM           Index LA diam:        4.10 cm 1.89 cm/m  RA Area:     15.80 cm LA Vol (A2C):   61.3 ml 28.29 ml/m RA Volume:   42.20 ml  19.47 ml/m LA Vol (A4C):   49.2 ml 22.70 ml/m LA Biplane Vol: 56.6 ml 26.12  ml/m  AORTIC VALVE AV Area (Vmax):    1.77 cm AV Area (Vmean):   1.72 cm AV Area (VTI):     1.86 cm AV Vmax:           199.75 cm/s AV Vmean:          153.500 cm/s AV VTI:            0.371 m AV Peak Grad:      16.0 mmHg AV Mean Grad:      10.2 mmHg LVOT Vmax:         139.00 cm/s LVOT Vmean:        104.000 cm/s LVOT VTI:          0.271 m LVOT/AV VTI ratio: 0.73  AORTA Ao Root diam: 2.90 cm MITRAL VALVE                TRICUSPID VALVE MV Area (PHT): 3.65 cm     TR Peak grad:   35.0 mmHg MV Decel Time: 208 msec     TR Vmax:        296.00 cm/s MV E velocity: 141.00 cm/s MV A velocity: 122.00 cm/s  SHUNTS MV E/A ratio:  1.16         Systemic VTI:  0.27 m                             Systemic Diam: 1.80 cm Dwayne D Callwood MD Electronically signed by Alwyn Peawayne D Callwood MD Signature Date/Time: 01/05/2020/10:58:40 PM    Final     Assessment & Plan    69 year old, caucasian, female with PMH significant for chronic atrial fibrillation (on eliquis), COPD with chronic respiratory failure, DM Type II, HTN, PVD with chronic stasis edema, morbid obesity, and depression who was [facility] for symptomatic anemia secondary to excessive vaginal bleeding that was further complicated by chronic anticoagulant use.  1.  Atrial fibrillation-had been on Eliquis however due to bleeding, will need to defer this for now.  Had converted to A. fib with RVR.  This a.m. appears to be in sinus rhythm.  We will continue with metoprolol succinate  50 mg daily and will  Cardizem  at 30 mg every 6.  2.  Anemia-hemoglobin 7.6 this a.m. down from 7.8.  As per above, will continue to defer chronic anticoagulation due to anemia and bleeding.    3.  CHF-BNP decreased to 481  down from 559 on admission..  No clinical evidence of heart failure at present.  Has diuresed 1.378 L since admission.  Continue with torsemide following renal function, electrolytes and hemodynamics.  Creatinine currently 1.38 improved from 1.82 several days ago.   GFR is 39. Potassium is in normal range.  4.  Hypotension-follow hemodynamics on Cardizem.  Currently stable.  We will continue with 36 for now and convert to 120 daily.  Signed, Darlin PriestlyKenneth A. Olanda Downie MD 01/08/2020, 10:10 AM  Pager: [phone]

## 2020-01-08 NOTE — Discharge Summary (Addendum)
Jasmine Osborn WHQ:[id number] DOB: [birth year] DOA: 01/03/2020  PCP: Kirk Ruths, MD  Admit date: 01/03/2020 Discharge date: 01/10/20  Admitted From: home Disposition:  [facility]  Recommendations for Outpatient Follow-up:  1. Follow up with PCP in 1 week 2. Please obtain BMP/CBC in one week 3. Follow up with Dr. Ubaldo Glassing , cardiology this week 4. please give Metoprolol succinate 100m po BID (NOT QD)     Discharge Condition:Stable CODE STATUS:full Diet recommendation: Heart Healthy , <2gm sodium   Brief/Interim Summary: Jasmine MOURERis a 69y.o. female with medical history significant of chronic atrial fibrillation on anticoagulation, chronic respiratory failure due to COPD, diabetes, morbid obesity, depression, hypertension, peripheral vascular disease, chronic stasis edema who came to the ER secondary to excessive vaginal bleed.Patient was seen in the ER found to have hemoglobin around 4 g.  Continued vaginal bleed also noted.  OB/GYN consulted. She received couple of Units of PRBC. COVID-19 was negative.  She has history of paroxysmal A. fib and was on Eliquis.  Her Eliquis was held.  She is status post dilatation and curettage/hysteroscopy on 01/05/2020.  Vaginal bleeding resolved.  She was started on progesterone IUD.  GYN recommended her Megace be discontinued on discharge.  Was also found with vitamin B12 deficiency and was given B12 injections during her admission.  She was also found on acute on chronic kidney injury likely due to prerenal azotemia/dehydration from vaginal bleeding which is now at baseline.  Her losartan was discontinued.  Due to transfusion and receiving IV fluids she did have volume overload was given Lasix IV as needed.  She can continue on her torsemide.  During her hospitalization she also had few beats of nonsustained VT and also went into A. fib RVR at times.  Cardiology was consulted.  She was started on Cardizem.  She will need an ischemic work-up as  outpatient with Dr. FUbaldo Glassing she will need to follow-up with cardiology next week for that since she has had few beats of nonsustained VT.  She is on chronic oxygen 3 L due to her chronic respiratory failure with COPD.  On admission she was also found with left lower extremity ulcer with surrounding cellulitis and was started on IV antibiotics.  X-ray was negative for osteomyelitis.  Wound care was consulted.  Wound care thought the appearance is consistent with intertrigo and moisture associated skin damage.  Instructions were given to the nursing staff. Since previous discharge planning patient went into A. fib RVR, also had couple of episodes of paroxysmal SVT.  Cardiology was following.  Her beta-blockers were increased from daily to twice daily.  She was instructed to use her BiPAP from home during sleep at night.  This did help resolve her dysrhythmia.  She had no overnight PSVT.  She is actually more awake and less lethargic using her BiPAP. Wound care nurse was consulted again today.recommended applying Eucerin cream to left leg Q day after bathing to assist with promoting healing to dry skin location. She is stable to be discharged to rehab. She will need to wear her home bipap from home for sleep apnea.   Discharge Diagnoses:  Principal Problem:   Symptomatic anemia Active Problems:   Persistent atrial fibrillation (HCC)   Morbid obesity (HCC)   Vaginal bleeding   ARF (acute renal failure) (HCC)   Cellulitis of left lower extremity   Skin ulcer of toe of left foot, limited to breakdown of skin (HCC)   AF (paroxysmal atrial fibrillation) ([place]  Discharge Instructions  Discharge Instructions    Call MD for:  temperature >100.4   Complete by: As directed    Diet - low sodium heart healthy   Complete by: As directed    Increase activity slowly   Complete by: As directed      Allergies as of 01/10/2020   No Known Allergies     Medication List    STOP taking these medications    losartan 25 MG tablet Commonly known as: COZAAR   megestrol 40 MG tablet Commonly known as: MEGACE     TAKE these medications   apixaban 5 MG Tabs tablet Commonly known as: ELIQUIS Take 5 mg by mouth 2 (two) times daily.   cephALEXin 500 MG capsule Commonly known as: KEFLEX Take 1 capsule (500 mg total) by mouth every 8 (eight) hours.   collagenase ointment Commonly known as: SANTYL Apply topically daily.   diltiazem 120 MG 24 hr capsule Commonly known as: CARDIZEM CD Take 1 capsule (120 mg total) by mouth daily.   fenofibrate 160 MG tablet Take 160 mg by mouth daily.   hydrocerin Crea Apply 1 application topically daily. Start taking on: January 11, 2020   Klor-Con 10 10 MEQ tablet Generic drug: potassium chloride Take 20 mEq by mouth daily.   latanoprost 0.005 % ophthalmic solution Commonly known as: XALATAN USE 1 DROP IN EACH EYE AT BEDTIME   magnesium oxide 400 (241.3 Mg) MG tablet Commonly known as: MAG-OX Take 1 tablet (400 mg total) by mouth 2 (two) times daily.   metFORMIN 500 MG tablet Commonly known as: GLUCOPHAGE Take 500 mg by mouth 2 (two) times daily with a meal.   metoprolol succinate 50 MG 24 hr tablet Commonly known as: TOPROL-XL Take 50 mg by mouth daily. What changed: Another medication with the same name was added. Make sure you understand how and when to take each.   metoprolol succinate 50 MG 24 hr tablet Commonly known as: TOPROL-XL Take 1 tablet (50 mg total) by mouth 2 (two) times daily. What changed: You were already taking a medication with the same name, and this prescription was added. Make sure you understand how and when to take each.   mirtazapine 30 MG tablet Commonly known as: REMERON Take 30 mg by mouth at bedtime.   multivitamin-lutein Caps capsule Take 1 capsule by mouth daily.   risperiDONE 1 MG tablet Commonly known as: RISPERDAL Take 1 tablet (1 mg total) by mouth at bedtime.   torsemide 20 MG tablet Commonly  known as: DEMADEX Take 20 mg by mouth daily.   tranexamic acid 650 MG Tabs tablet Commonly known as: LYSTEDA Take 2 tablets (1,300 mg total) by mouth 3 (three) times daily.   vitamin B-12 1000 MCG tablet Commonly known as: CYANOCOBALAMIN Take 1 tablet (1,000 mcg total) by mouth daily.       Contact information for follow-up providers    Schedule an appointment as soon as possible for a visit with Beasley, Bethany, MD.   Specialty: Obstetrics and Gynecology Why: For postop check after discharge and when able Contact information: [address] [place] [address] [phone]        Fath, Kenneth A, MD Follow up in 1 week(s).   Specialty: Cardiology Contact information: [address] [place]  [address] [phone]        Anderson, Marshall W, MD Follow up in 1 week(s).   Specialty: Internal Medicine Why: needs blood work Contact information: [address] [facility] [place] [address] [phone]            Contact information for after-discharge care    Destination    [facility] Preferred [facility] .   Service: Skilled Nursing Contact information: [address] [place] [address] [phone]                 No Known Allergies  Consultations: Pulmonary, cardiology, GYN mobile nursing  Procedures/Studies: US PELVIS (TRANSABDOMINAL ONLY)  Result Date: 01/04/2020 CLINICAL DATA:  Menorrhagia.  Severe anemia. EXAM: TRANSABDOMINAL ULTRASOUND OF PELVIS TECHNIQUE: Transabdominal ultrasound examination of the pelvis was performed including evaluation of the uterus, ovaries, adnexal regions, and pelvic cul-de-sac. COMPARISON:  Pelvic MRI 05/08/2017. CT abdomen and pelvis 07/14/2017. Pelvic ultrasound 07/27/2010. FINDINGS: Examination is limited by patient body habitus. A transvaginal examination could not be performed. Uterus Measurements: 14.9 x 8.3 x 9.1 cm = volume: 589 mL. Chronically  enlarged, heterogeneous uterus with clustered nodular echogenic foci near the uterine fundus. Endometrium Thickness: 9 mm.  No focal abnormality visualized. Right ovary Not visualized. Left ovary Not visualized. Other findings:  No abnormal free fluid. IMPRESSION: 1. Limited transabdominal pelvic ultrasound. 2. Chronically enlarged and heterogeneous uterus with diffuse adenomyosis diagnosed on prior MRI. 3. 9 mm endometrial thickness, less than on the 2018 MRI but still abnormal for age. In the setting of post-menopausal bleeding, endometrial sampling is indicated to exclude carcinoma. If results are benign, sonohysterogram should be considered for focal lesion work-up. (Ref: Radiological Reasoning: Algorithmic Workup of Abnormal Vaginal Bleeding with Endovaginal Sonography and Sonohysterography. AJR 2008; 191:S68-73) 4. Nonvisualization of the ovaries. Electronically Signed   By: Allen  Grady M.D.   On: 01/04/2020 18:31   DG Chest Port 1 View  Result Date: 01/06/2020 CLINICAL DATA:  Acute respiratory failure with hypoxia EXAM: PORTABLE CHEST 1 VIEW COMPARISON:  Two days ago FINDINGS: Cardiomegaly with vascular pedicle widening primarily from mediastinal fat. There is vascular congestion and hazy opacity at the bases. No visible pneumothorax IMPRESSION: Cardiomegaly and vascular congestion. Atelectasis, pleural fluid, or infection could be present at the bases. Electronically Signed   By: Jonathon  Watts M.D.   On: 01/06/2020 05:17   DG CHEST PORT 1 VIEW  Result Date: 01/04/2020 CLINICAL DATA:  Tachypnea EXAM: PORTABLE CHEST 1 VIEW COMPARISON:  11/04/2017 FINDINGS: Cardiac shadow is enlarged. Increased vascular congestion is noted with interstitial edema consistent with congestive failure. No focal infiltrate is seen. No acute bony abnormality is noted. Degenerative changes of the thoracic spine are seen. IMPRESSION: Changes consistent with CHF. Electronically Signed   By: Mark  Lukens M.D.   On: 01/04/2020  22:22   DG Foot Complete Left  Result Date: 01/04/2020 CLINICAL DATA:  Ulcer along the left heel and at the toes. EXAM: LEFT FOOT - COMPLETE 3+ VIEW COMPARISON:  Left foot radiographs 01/03/2012. FINDINGS: Postoperative changes of the distal tibia are again noted. Fractured screw is stable. Prominent plantar calcaneal spur is noted. Soft tissue ulceration is evident without penetration of the bone. No acute or focal osseous abnormalities are associated. Extensive soft tissue swelling is present over the foot. Ulcerations involving the digits are not well appreciated due to overlap. No focal osseous erosion is evident. IMPRESSION: 1. Plantar ulceration at the calcaneus without acute osseous abnormality. 2. Prominent plantar spur at the calcaneus. 3. Extensive soft tissue swelling over the foot without other acute or focal osseous abnormality. Electronically Signed   By: Christopher  Mattern M.D.     On: 01/04/2020 09:22   ECHOCARDIOGRAM COMPLETE  Result Date: 01/05/2020    ECHOCARDIOGRAM REPORT   Patient Name:   Jasmine Osborn Date of Exam: 01/05/2020 Medical Rec #:  [id number]           Height:       64.0 in Accession #:    [id number]          Weight:       254.4 lb Date of Birth:  [birth year]            BSA:          2.167 m Patient Age:    87 years            BP:           136/40 mmHg Patient Gender: F                   HR:           92 bpm. Exam Location:  [facility] Procedure: 2D Echo, Cardiac Doppler, Color Doppler and Intracardiac            Opacification Agent Indications:     R94.31 Abnormla EKG  History:         Patient has prior history of Echocardiogram examinations, most                  recent 11/04/2017. Risk Factors:Hypertension, Dyslipidemia,                  Diabetes and Obesity. Obesity hypoventilation syndrome. Atrial                  Fibrillation.  Sonographer:     Wilford Sports Rodgers-Jones Referring Phys:  [id number] Nolberto Hanlon Diagnosing Phys: Yolonda Kida MD IMPRESSIONS  1. Left ventricular  ejection fraction, by estimation, is 65 to 70%. The left ventricle has normal function. The left ventricle has no regional wall motion abnormalities. Left ventricular diastolic parameters are consistent with Grade I diastolic dysfunction (impaired relaxation).  2. Right ventricular systolic function is normal. The right ventricular size is normal. There is moderately elevated pulmonary artery systolic pressure.  3. The mitral valve is normal in structure. Trivial mitral valve regurgitation.  4. The aortic valve is normal in structure. Aortic valve regurgitation is not visualized. FINDINGS  Left Ventricle: Left ventricular ejection fraction, by estimation, is 65 to 70%. The left ventricle has normal function. The left ventricle has no regional wall motion abnormalities. Definity contrast agent was given IV to delineate the left ventricular  endocardial borders. The left ventricular internal cavity size was normal in size. There is no left ventricular hypertrophy. Left ventricular diastolic parameters are consistent with Grade I diastolic dysfunction (impaired relaxation). Right Ventricle: The right ventricular size is normal. No increase in right ventricular wall thickness. Right ventricular systolic function is normal. There is moderately elevated pulmonary artery systolic pressure. The tricuspid regurgitant velocity is 2.96 m/s, and with an assumed right atrial pressure of 10 mmHg, the estimated right ventricular systolic pressure is 38.7 mmHg. Left Atrium: Left atrial size was normal in size. Right Atrium: Right atrial size was normal in size. Pericardium: There is no evidence of pericardial effusion. Mitral Valve: The mitral valve is normal in structure. Trivial mitral valve regurgitation. Tricuspid Valve: The tricuspid valve is normal in structure. Tricuspid valve regurgitation is mild. Aortic Valve: The aortic valve is normal in structure. Aortic valve regurgitation is not visualized. Aortic valve mean gradient  measures 10.2  mmHg. Aortic valve peak gradient measures 16.0 mmHg. Aortic valve area, by VTI measures 1.86 cm. Pulmonic Valve: The pulmonic valve was normal in structure. Pulmonic valve regurgitation is not visualized. Aorta: The aortic root is normal in size and structure. IAS/Shunts: The atrial septum is grossly normal.  LEFT VENTRICLE PLAX 2D LVIDd:         4.56 cm  Diastology LVIDs:         2.79 cm  LV e' lateral:   7.40 cm/s LV PW:         1.03 cm  LV E/e' lateral: 19.1 LV IVS:        0.98 cm  LV e' medial:    5.55 cm/s LVOT diam:     1.80 cm  LV E/e' medial:  25.4 LV SV:         69 LV SV Index:   32 LVOT Area:     2.54 cm  RIGHT VENTRICLE RV Basal diam:  3.43 cm RV S prime:     14.10 cm/s TAPSE (M-mode): 2.3 cm LEFT ATRIUM             Index       RIGHT ATRIUM           Index LA diam:        4.10 cm 1.89 cm/m  RA Area:     15.80 cm LA Vol (A2C):   61.3 ml 28.29 ml/m RA Volume:   42.20 ml  19.47 ml/m LA Vol (A4C):   49.2 ml 22.70 ml/m LA Biplane Vol: 56.6 ml 26.12 ml/m  AORTIC VALVE AV Area (Vmax):    1.77 cm AV Area (Vmean):   1.72 cm AV Area (VTI):     1.86 cm AV Vmax:           199.75 cm/s AV Vmean:          153.500 cm/s AV VTI:            0.371 m AV Peak Grad:      16.0 mmHg AV Mean Grad:      10.2 mmHg LVOT Vmax:         139.00 cm/s LVOT Vmean:        104.000 cm/s LVOT VTI:          0.271 m LVOT/AV VTI ratio: 0.73  AORTA Ao Root diam: 2.90 cm MITRAL VALVE                TRICUSPID VALVE MV Area (PHT): 3.65 cm     TR Peak grad:   35.0 mmHg MV Decel Time: 208 msec     TR Vmax:        296.00 cm/s MV E velocity: 141.00 cm/s MV A velocity: 122.00 cm/s  SHUNTS MV E/A ratio:  1.16         Systemic VTI:  0.27 m                             Systemic Diam: 1.80 cm Dwayne D Callwood MD Electronically signed by Dwayne D Callwood MD Signature Date/Time: 01/05/2020/10:58:40 PM    Final       Subjective: Denies any shortness of breath or any complaints  Discharge Exam: Vitals:   01/10/20 0745 01/10/20  1131  BP: (!) 179/65 (!) 149/65  Pulse: 83 97  Resp: 17 18  Temp: 98.7 F (37.1 C) 97.9 F (36.6 C)  SpO2: 91% 92%     Vitals:   01/09/20 2020 01/10/20 0545 01/10/20 0745 01/10/20 1131  BP: (!) 128/56 (!) 131/57 (!) 179/65 (!) 149/65  Pulse: 82 60 83 97  Resp: 20 19 17 18  Temp: 98.8 F (37.1 C) 98.9 F (37.2 C) 98.7 F (37.1 C) 97.9 F (36.6 C)  TempSrc: Oral Oral Oral   SpO2: 92% 91% 91% 92%  Weight:  112.9 kg    Height:        General: Pt is alert, awake, not in acute distress Cardiovascular: RRR, S1/S2 +, no rubs, no gallops Respiratory: CTA bilaterally, no wheezing, no rhonchi Abdominal: Soft, NT, ND, bowel sounds + Extremities: Decreased lower extremity edema.  Decreased erythema of left lower extremity.  Dressing in place    The results of significant diagnostics from this hospitalization (including imaging, microbiology, ancillary and laboratory) are listed below for reference.     Microbiology: Recent Results (from the past 240 hour(s))  Respiratory Panel by RT PCR (Flu A&B, Covid) - Nasopharyngeal Swab     Status: None   Collection Time: 01/03/20  5:26 PM   Specimen: Nasopharyngeal Swab  Result Value Ref Range Status   SARS Coronavirus 2 by RT PCR NEGATIVE NEGATIVE Final    Comment: (NOTE) SARS-CoV-2 target nucleic acids are NOT DETECTED. The SARS-CoV-2 RNA is generally detectable in upper respiratoy specimens during the acute phase of infection. The lowest concentration of SARS-CoV-2 viral copies this assay can detect is 131 copies/mL. A negative result does not preclude SARS-Cov-2 infection and should not be used as the sole basis for treatment or other patient management decisions. A negative result may occur with  improper specimen collection/handling, submission of specimen other than nasopharyngeal swab, presence of viral mutation(s) within the areas targeted by this assay, and inadequate number of viral copies (<131 copies/mL). A negative result  must be combined with clinical observations, patient history, and epidemiological information. The expected result is Negative. Fact Sheet for Patients:  https://www.fda.gov/media/142436/download Fact Sheet for Healthcare Providers:  https://www.fda.gov/media/142435/download This test is not yet ap proved or cleared by the [place] FDA and  has been authorized for detection and/or diagnosis of SARS-CoV-2 by FDA under an Emergency Use Authorization (EUA). This EUA will remain  in effect (meaning this test can be used) for the duration of the COVID-19 declaration under Section 564(b)(1) of the Act, 21 U.S.C. section 360bbb-3(b)(1), unless the authorization is terminated or revoked sooner.    Influenza A by PCR NEGATIVE NEGATIVE Final   Influenza B by PCR NEGATIVE NEGATIVE Final    Comment: (NOTE) The Xpert Xpress SARS-CoV-2/FLU/RSV assay is intended as an aid in  the diagnosis of influenza from Nasopharyngeal swab specimens and  should not be used as a sole basis for treatment. Nasal washings and  aspirates are unacceptable for Xpert Xpress SARS-CoV-2/FLU/RSV  testing. Fact Sheet for Patients: https://www.fda.gov/media/142436/download Fact Sheet for Healthcare Providers: https://www.fda.gov/media/142435/download This test is not yet approved or cleared by the [place] FDA and  has been authorized for detection and/or diagnosis of SARS-CoV-2 by  FDA under an Emergency Use Authorization (EUA). This EUA will remain  in effect (meaning this test can be used) for the duration of the  Covid-19 declaration under Section 564(b)(1) of the Act, 21  U.S.C. section 360bbb-3(b)(1), unless the authorization is  terminated or revoked. Performed at [facility], [address] [place], [place] [address]   MRSA PCR Screening     Status: None   Collection Time: 01/03/20 10:54 PM   Specimen:   Nasopharyngeal  Result Value Ref Range Status   MRSA by PCR NEGATIVE NEGATIVE Final     Comment:        The GeneXpert MRSA Assay (FDA approved for NASAL specimens only), is one component of a comprehensive MRSA colonization surveillance program. It is not intended to diagnose MRSA infection nor to guide or monitor treatment for MRSA infections. Performed at [facility], [address] [place], [place] [address]   Respiratory Panel by RT PCR (Flu A&B, Covid) - Nasopharyngeal Swab     Status: None   Collection Time: 01/08/20 12:05 PM   Specimen: Nasopharyngeal Swab  Result Value Ref Range Status   SARS Coronavirus 2 by RT PCR NEGATIVE NEGATIVE Final    Comment: (NOTE) SARS-CoV-2 target nucleic acids are NOT DETECTED. The SARS-CoV-2 RNA is generally detectable in upper respiratoy specimens during the acute phase of infection. The lowest concentration of SARS-CoV-2 viral copies this assay can detect is 131 copies/mL. A negative result does not preclude SARS-Cov-2 infection and should not be used as the sole basis for treatment or other patient management decisions. A negative result may occur with  improper specimen collection/handling, submission of specimen other than nasopharyngeal swab, presence of viral mutation(s) within the areas targeted by this assay, and inadequate number of viral copies (<131 copies/mL). A negative result must be combined with clinical observations, patient history, and epidemiological information. The expected result is Negative. Fact Sheet for Patients:  https://www.fda.gov/media/142436/download Fact Sheet for Healthcare Providers:  https://www.fda.gov/media/142435/download This test is not yet ap proved or cleared by the [place] FDA and  has been authorized for detection and/or diagnosis of SARS-CoV-2 by FDA under an Emergency Use Authorization (EUA). This EUA will remain  in effect (meaning this test can be used) for the duration of the COVID-19 declaration under Section 564(b)(1) of the Act, 21 U.S.C. section  360bbb-3(b)(1), unless the authorization is terminated or revoked sooner.    Influenza A by PCR NEGATIVE NEGATIVE Final   Influenza B by PCR NEGATIVE NEGATIVE Final    Comment: (NOTE) The Xpert Xpress SARS-CoV-2/FLU/RSV assay is intended as an aid in  the diagnosis of influenza from Nasopharyngeal swab specimens and  should not be used as a sole basis for treatment. Nasal washings and  aspirates are unacceptable for Xpert Xpress SARS-CoV-2/FLU/RSV  testing. Fact Sheet for Patients: https://www.fda.gov/media/142436/download Fact Sheet for Healthcare Providers: https://www.fda.gov/media/142435/download This test is not yet approved or cleared by the [place] FDA and  has been authorized for detection and/or diagnosis of SARS-CoV-2 by  FDA under an Emergency Use Authorization (EUA). This EUA will remain  in effect (meaning this test can be used) for the duration of the  Covid-19 declaration under Section 564(b)(1) of the Act, 21  U.S.C. section 360bbb-3(b)(1), unless the authorization is  terminated or revoked. Performed at [facility], [address] [place], [place] [address]      Labs: BNP (last 3 results) Recent Labs    01/06/20 0458 01/09/20 0533 01/10/20 0533  BNP 481.0* 824.0* 413.0*   Basic Metabolic Panel: Recent Labs  Lab 01/05/20 0620 01/06/20 0458 01/08/20 0437 01/09/20 0533 01/10/20 0533  NA 145 143 143 145 143  K 4.6 4.7 4.2 4.1 3.6  CL 110 107 107 105 101  CO2 29 29 30 31 31  GLUCOSE 101* 180* 100* 122* 118*  BUN 19 21 26* 16 15  CREATININE 1.58* 1.52* 1.38* 1.07* 1.18*  CALCIUM 8.5* 8.6* 8.5* 9.2 9.3  MG 2.0 2.1  --    1.9  --    Liver Function Tests: Recent Labs  Lab 01/04/20 0716  AST 13*  ALT 12  ALKPHOS 35*  BILITOT 0.9  PROT 5.8*  ALBUMIN 2.9*   No results for input(s): LIPASE, AMYLASE in the last 168 hours. No results for input(s): AMMONIA in the last 168 hours. CBC: Recent Labs  Lab 01/04/20 2337 01/05/20 0620  01/06/20 0458 01/07/20 0448 01/09/20 0533  WBC 6.9 6.3 8.0 6.7 5.2  HGB 8.3* 8.2* 7.8* 7.6* 8.6*  HCT 29.4* 28.5* 28.2* 27.4* 30.2*  MCV 99.7 99.0 100.7* 102.2* 99.0  PLT 173 161 170 164 155   Cardiac Enzymes: No results for input(s): CKTOTAL, CKMB, CKMBINDEX, TROPONINI in the last 168 hours. BNP: Invalid input(s): POCBNP CBG: Recent Labs  Lab 01/03/20 2245  GLUCAP 140*   D-Dimer No results for input(s): DDIMER in the last 72 hours. Hgb A1c No results for input(s): HGBA1C in the last 72 hours. Lipid Profile No results for input(s): CHOL, HDL, LDLCALC, TRIG, CHOLHDL, LDLDIRECT in the last 72 hours. Thyroid function studies No results for input(s): TSH, T4TOTAL, T3FREE, THYROIDAB in the last 72 hours.  Invalid input(s): FREET3 Anemia work up No results for input(s): VITAMINB12, FOLATE, FERRITIN, TIBC, IRON, RETICCTPCT in the last 72 hours. Urinalysis    Component Value Date/Time   COLORURINE YELLOW (A) 11/03/2017 0023   APPEARANCEUR CLEAR (A) 11/03/2017 0023   LABSPEC 1.018 11/03/2017 0023   PHURINE 5.0 11/03/2017 0023   GLUCOSEU NEGATIVE 11/03/2017 0023   HGBUR SMALL (A) 11/03/2017 0023   BILIRUBINUR NEGATIVE 11/03/2017 0023   KETONESUR NEGATIVE 11/03/2017 0023   PROTEINUR 30 (A) 11/03/2017 0023   NITRITE NEGATIVE 11/03/2017 0023   LEUKOCYTESUR NEGATIVE 11/03/2017 0023   Sepsis Labs Invalid input(s): PROCALCITONIN,  WBC,  LACTICIDVEN Microbiology Recent Results (from the past 240 hour(s))  Respiratory Panel by RT PCR (Flu A&B, Covid) - Nasopharyngeal Swab     Status: None   Collection Time: 01/03/20  5:26 PM   Specimen: Nasopharyngeal Swab  Result Value Ref Range Status   SARS Coronavirus 2 by RT PCR NEGATIVE NEGATIVE Final    Comment: (NOTE) SARS-CoV-2 target nucleic acids are NOT DETECTED. The SARS-CoV-2 RNA is generally detectable in upper respiratoy specimens during the acute phase of infection. The lowest concentration of SARS-CoV-2 viral copies this  assay can detect is 131 copies/mL. A negative result does not preclude SARS-Cov-2 infection and should not be used as the sole basis for treatment or other patient management decisions. A negative result may occur with  improper specimen collection/handling, submission of specimen other than nasopharyngeal swab, presence of viral mutation(s) within the areas targeted by this assay, and inadequate number of viral copies (<131 copies/mL). A negative result must be combined with clinical observations, patient history, and epidemiological information. The expected result is Negative. Fact Sheet for Patients:  https://www.fda.gov/media/142436/download Fact Sheet for Healthcare Providers:  https://www.fda.gov/media/142435/download This test is not yet ap proved or cleared by the [place] FDA and  has been authorized for detection and/or diagnosis of SARS-CoV-2 by FDA under an Emergency Use Authorization (EUA). This EUA will remain  in effect (meaning this test can be used) for the duration of the COVID-19 declaration under Section 564(b)(1) of the Act, 21 U.S.C. section 360bbb-3(b)(1), unless the authorization is terminated or revoked sooner.    Influenza A by PCR NEGATIVE NEGATIVE Final   Influenza B by PCR NEGATIVE NEGATIVE Final    Comment: (NOTE) The Xpert Xpress SARS-CoV-2/FLU/RSV assay is intended as   an aid in  the diagnosis of influenza from Nasopharyngeal swab specimens and  should not be used as a sole basis for treatment. Nasal washings and  aspirates are unacceptable for Xpert Xpress SARS-CoV-2/FLU/RSV  testing. Fact Sheet for Patients: PinkCheek.be Fact Sheet for Healthcare Providers: GravelBags.it This test is not yet approved or cleared by the [place] FDA and  has been authorized for detection and/or diagnosis of SARS-CoV-2 by  FDA under an Emergency Use Authorization (EUA). This EUA will remain  in  effect (meaning this test can be used) for the duration of the  Covid-19 declaration under Section 564(b)(1) of the Act, 21  U.S.C. section 360bbb-3(b)(1), unless the authorization is  terminated or revoked. Performed at [facility], [address] [place], [place] [address]   MRSA PCR Screening     Status: None   Collection Time: 01/03/20 10:54 PM   Specimen: Nasopharyngeal  Result Value Ref Range Status   MRSA by PCR NEGATIVE NEGATIVE Final    Comment:        The GeneXpert MRSA Assay (FDA approved for NASAL specimens only), is one component of a comprehensive MRSA colonization surveillance program. It is not intended to diagnose MRSA infection nor to guide or monitor treatment for MRSA infections. Performed at [facility], [address] [place], [place] [address]   Respiratory Panel by RT PCR (Flu A&B, Covid) - Nasopharyngeal Swab     Status: None   Collection Time: 01/08/20 12:05 PM   Specimen: Nasopharyngeal Swab  Result Value Ref Range Status   SARS Coronavirus 2 by RT PCR NEGATIVE NEGATIVE Final    Comment: (NOTE) SARS-CoV-2 target nucleic acids are NOT DETECTED. The SARS-CoV-2 RNA is generally detectable in upper respiratoy specimens during the acute phase of infection. The lowest concentration of SARS-CoV-2 viral copies this assay can detect is 131 copies/mL. A negative result does not preclude SARS-Cov-2 infection and should not be used as the sole basis for treatment or other patient management decisions. A negative result may occur with  improper specimen collection/handling, submission of specimen other than nasopharyngeal swab, presence of viral mutation(s) within the areas targeted by this assay, and inadequate number of viral copies (<131 copies/mL). A negative result must be combined with clinical observations, patient history, and epidemiological information. The expected result is Negative. Fact Sheet for Patients:   PinkCheek.be Fact Sheet for Healthcare Providers:  GravelBags.it This test is not yet ap proved or cleared by the [place] FDA and  has been authorized for detection and/or diagnosis of SARS-CoV-2 by FDA under an Emergency Use Authorization (EUA). This EUA will remain  in effect (meaning this test can be used) for the duration of the COVID-19 declaration under Section 564(b)(1) of the Act, 21 U.S.C. section 360bbb-3(b)(1), unless the authorization is terminated or revoked sooner.    Influenza A by PCR NEGATIVE NEGATIVE Final   Influenza B by PCR NEGATIVE NEGATIVE Final    Comment: (NOTE) The Xpert Xpress SARS-CoV-2/FLU/RSV assay is intended as an aid in  the diagnosis of influenza from Nasopharyngeal swab specimens and  should not be used as a sole basis for treatment. Nasal washings and  aspirates are unacceptable for Xpert Xpress SARS-CoV-2/FLU/RSV  testing. Fact Sheet for Patients: PinkCheek.be Fact Sheet for Healthcare Providers: GravelBags.it This test is not yet approved or cleared by the [place] FDA and  has been authorized for detection and/or diagnosis of SARS-CoV-2 by  FDA under an Emergency Use Authorization (EUA). This EUA will  remain  in effect (meaning this test can be used) for the duration of the  Covid-19 declaration under Section 564(b)(1) of the Act, 21  U.S.C. section 360bbb-3(b)(1), unless the authorization is  terminated or revoked. Performed at [facility], [address] [place], [place] [address]    1. Symptomatic anemia with a hemoglobin of 4.2. Patient given transfusions(3 units of packed red blood cells)and hemoglobin up to 8 today.  Secondary to vaginal bleeding.  Status dilatation and curettage/hysteroscopy , vaginal bleed resolved. FYN following. D/c foley.progesterone IUD is in place . Per Gyn, stop Megace on  discharge. 2. Iron deficiency anemia -given  IV iron .monitor cbc in 2 days 3. Vitamin B12 deficiency give B12 injections for 3 days 4. Acute on chronic kidney injury continue to monitor.close to baselineHold losartan   5. 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 Echo with nml EF.cards. Good output. Had a dose of lasix yesterday, euvolemic now. Will continue beta blk and torsemide. 6. NSVT- likely 2/2 anemia, chf.  On beta blk. Echo nml ef. Cards following. Continue beta blk. Will need to f/u cards Dr. Fath this week for ischemic w/u as outpt. 7. Essential hypertension.On metoprolol and cardizem 8. Left lower extremity ulcer with surrounding cellulitis started Ancef. X-ray negative for osteomyelitis. Check an ESR. Wound care consult. 9. COPD with chronic respiratory failure on 3 L of oxygen  10.paroxysmal atrial fibrillation -Per GYN Dr. Beasley via secure chat, can restart  Eliquis 24hrs post surgery. In sr now. Will switch to cardizem 120mg qd.  11.PT/OT: snf .   Time coordinating discharge: Over 30 minutes  SIGNED:    , MD  [facility] 01/10/2020, 1:01 PM Pager   If 7PM-7AM, please contact night-coverage [email or web address] Password [email or web address] 

## 2020-01-09 LAB — CBC
HCT: 30.2 % — ABNORMAL LOW (ref 36.0–46.0)
Hemoglobin: 8.6 g/dL — ABNORMAL LOW (ref 12.0–15.0)
MCH: 28.2 pg (ref 26.0–34.0)
MCHC: 28.5 g/dL — ABNORMAL LOW (ref 30.0–36.0)
MCV: 99 fL (ref 80.0–100.0)
Platelets: 155 10*3/uL (ref 150–400)
RBC: 3.05 MIL/uL — ABNORMAL LOW (ref 3.87–5.11)
RDW: 20.4 % — ABNORMAL HIGH (ref 11.5–15.5)
WBC: 5.2 10*3/uL (ref 4.0–10.5)
nRBC: 0 % (ref 0.0–0.2)

## 2020-01-09 LAB — BASIC METABOLIC PANEL
Anion gap: 9 (ref 5–15)
BUN: 16 mg/dL (ref 8–23)
CO2: 31 mmol/L (ref 22–32)
Calcium: 9.2 mg/dL (ref 8.9–10.3)
Chloride: 105 mmol/L (ref 98–111)
Creatinine, Ser: 1.07 mg/dL — ABNORMAL HIGH (ref 0.44–1.00)
GFR calc Af Amer: >60 mL/min (ref >60)
GFR calc non Af Amer: 53 mL/min — ABNORMAL LOW (ref >60)
Glucose, Bld: 122 mg/dL — ABNORMAL HIGH (ref 70–99)
Potassium: 4.1 mmol/L (ref 3.5–5.1)
Sodium: 145 mmol/L (ref 135–145)

## 2020-01-09 LAB — BRAIN NATRIURETIC PEPTIDE: B Natriuretic Peptide: 824 pg/mL — ABNORMAL HIGH (ref 0.0–100.0)

## 2020-01-09 LAB — MAGNESIUM: Magnesium: 1.9 mg/dL (ref 1.7–2.4)

## 2020-01-09 MED ORDER — FUROSEMIDE 10 MG/ML IJ SOLN
40.0000 mg | Freq: Once | INTRAMUSCULAR | Status: AC
  Administered 2020-01-09: 40 mg via INTRAVENOUS
  Filled 2020-01-09: qty 4

## 2020-01-09 MED ORDER — METOPROLOL SUCCINATE ER 50 MG PO TB24
50.0000 mg | ORAL_TABLET | Freq: Two times a day (BID) | ORAL | Status: DC
  Administered 2020-01-09 – 2020-01-10 (×3): 50 mg via ORAL
  Filled 2020-01-09 (×3): qty 1

## 2020-01-09 MED ORDER — MAGNESIUM OXIDE 400 (241.3 MG) MG PO TABS
400.0000 mg | ORAL_TABLET | Freq: Two times a day (BID) | ORAL | Status: DC
  Administered 2020-01-09 – 2020-01-10 (×3): 400 mg via ORAL
  Filled 2020-01-09 (×3): qty 1

## 2020-01-09 NOTE — Progress Notes (Signed)
Patient placed on her home trilogy unit per Md request. Tolerating well at this time, sats are 92% on 4L bleed in. Trilogy has no cracked case and electrical cord has no frays or cuts.

## 2020-01-09 NOTE — Progress Notes (Signed)
Patient Name: Jasmine Osborn Date of Encounter: 01/09/2020  Hospital Problem List     Principal Problem:   Symptomatic anemia Active Problems:   Persistent atrial fibrillation (HCC)   Morbid obesity (HCC)   Vaginal bleeding   ARF (acute renal failure) (HCC)   Cellulitis of left lower extremity   Skin ulcer of toe of left foot, limited to breakdown of skin (HCC)   AF (paroxysmal atrial fibrillation) [facility])    Patient Profile        69 year old, caucasian, female with PMH significant for chronic atrial fibrillation (on eliquis), COPD with chronic respiratory failure, DM Type II, HTN, PVD with chronic stasis edema, morbid obesity, and depression who was admitted to [facility] for symptomatic anemia secondary to excessive vaginal bleeding that was further complicated by chronic anticoagulant use.  Subjective   Somnolent with runs of tachycardia with narrow complex of wide-complex while asleep.  Likely secondary to sleep apnea.  Discussed with Dr. Kurtis Bushman  Inpatient Medications    . sodium chloride   Intravenous Once  . apixaban  5 mg Oral BID  . cephALEXin  500 mg Oral Q8H  . chlorhexidine  15 mL Mouth Rinse BID  . Chlorhexidine Gluconate Cloth  6 each Topical Q0600  . collagenase   Topical Daily  . cyanocobalamin  1,000 mcg Intramuscular Daily  . diltiazem  120 mg Oral Daily  . fenofibrate  160 mg Oral Daily  . latanoprost  1 drop Both Eyes QHS  . magnesium oxide  400 mg Oral BID  . mouth rinse  15 mL Mouth Rinse q12n4p  . metoprolol succinate  50 mg Oral BID  . mirtazapine  30 mg Oral QHS  . multivitamin-lutein  1 capsule Oral Daily  . potassium chloride SA  20 mEq Oral Daily  . risperiDONE  1 mg Oral QHS  . torsemide  20 mg Oral Daily  . tranexamic acid  1,300 mg Oral TID    Vital Signs    Vitals:   01/09/20 0450 01/09/20 0454 01/09/20 0745 01/09/20 1136  BP:  (!) 162/65 (!) 151/40 (!) 161/43  Pulse:   95 90  Resp:   19 18  Temp:  98.5 F (36.9 C) (!) 97.5 F  (36.4 C) 97.6 F (36.4 C)  TempSrc:  Oral Oral   SpO2: (!) 88% 92% 93% 93%  Weight:  113 kg    Height:        Intake/Output Summary (Last 24 hours) at 01/09/2020 1154 Last data filed at 01/09/2020 1136 Gross per 24 hour  Intake 69.27 ml  Output 200 ml  Net -130.73 ml   Filed Weights   01/07/20 0400 01/08/20 0533 01/09/20 0454  Weight: 116 kg 118.1 kg 113 kg    Physical Exam    GEN: Well nourished, well developed, in no acute distress.  HEENT: normal.  Neck: Supple, no JVD, carotid bruits, or masses. Cardiac: RRR, no murmurs, rubs, or gallops. No clubbing, cyanosis, edema.  Radials/DP/PT 2+ and equal bilaterally.  Respiratory:  Respirations regular and unlabored, clear to auscultation bilaterally. GI: Soft, nontender, nondistended, BS + x 4. MS: no deformity or atrophy. Skin: warm and dry, no rash. Neuro:  Strength and sensation are intact. Psych: Normal affect.  Labs    CBC Recent Labs    01/07/20 0448 01/09/20 0533  WBC 6.7 5.2  HGB 7.6* 8.6*  HCT 27.4* 30.2*  MCV 102.2* 99.0  PLT 164 580   Basic Metabolic Panel Recent Labs  01/08/20 0437 01/09/20 0533  NA 143 145  K 4.2 4.1  CL 107 105  CO2 30 31  GLUCOSE 100* 122*  BUN 26* 16  CREATININE 1.38* 1.07*  CALCIUM 8.5* 9.2  MG  --  1.9   Liver Function Tests No results for input(s): AST, ALT, ALKPHOS, BILITOT, PROT, ALBUMIN in the last 72 hours. No results for input(s): LIPASE, AMYLASE in the last 72 hours. Cardiac Enzymes No results for input(s): CKTOTAL, CKMB, CKMBINDEX, TROPONINI in the last 72 hours. BNP No results for input(s): BNP in the last 72 hours. D-Dimer No results for input(s): DDIMER in the last 72 hours. Hemoglobin A1C No results for input(s): HGBA1C in the last 72 hours. Fasting Lipid Panel No results for input(s): CHOL, HDL, LDLCALC, TRIG, CHOLHDL, LDLDIRECT in the last 72 hours. Thyroid Function Tests No results for input(s): TSH, T4TOTAL, T3FREE, THYROIDAB in the last 72  hours.  Invalid input(s): FREET3  Telemetry    Sinus rhythm with intermittent runs of probable SVT.  ECG      Radiology    US PELVIS (TRANSABDOMINAL ONLY)  Result Date: 01/04/2020 CLINICAL DATA:  Menorrhagia.  Severe anemia. EXAM: TRANSABDOMINAL ULTRASOUND OF PELVIS TECHNIQUE: Transabdominal ultrasound examination of the pelvis was performed including evaluation of the uterus, ovaries, adnexal regions, and pelvic cul-de-sac. COMPARISON:  Pelvic MRI 05/08/2017. CT abdomen and pelvis 07/14/2017. Pelvic ultrasound 07/27/2010. FINDINGS: Examination is limited by patient body habitus. A transvaginal examination could not be performed. Uterus Measurements: 14.9 x 8.3 x 9.1 cm = volume: 589 mL. Chronically enlarged, heterogeneous uterus with clustered nodular echogenic foci near the uterine fundus. Endometrium Thickness: 9 mm.  No focal abnormality visualized. Right ovary Not visualized. Left ovary Not visualized. Other findings:  No abnormal free fluid. IMPRESSION: 1. Limited transabdominal pelvic ultrasound. 2. Chronically enlarged and heterogeneous uterus with diffuse adenomyosis diagnosed on prior MRI. 3. 9 mm endometrial thickness, less than on the 2018 MRI but still abnormal for age. In the setting of post-menopausal bleeding, endometrial sampling is indicated to exclude carcinoma. If results are benign, sonohysterogram should be considered for focal lesion work-up. (Ref: Radiological Reasoning: Algorithmic Workup of Abnormal Vaginal Bleeding with Endovaginal Sonography and Sonohysterography. AJR 2008; 678:L38-10) 4. Nonvisualization of the ovaries. Electronically Signed   By: Sebastian Ache M.D.   On: 01/04/2020 18:31   DG Chest Port 1 View  Result Date: 01/06/2020 CLINICAL DATA:  Acute respiratory failure with hypoxia EXAM: PORTABLE CHEST 1 VIEW COMPARISON:  Two days ago FINDINGS: Cardiomegaly with vascular pedicle widening primarily from mediastinal fat. There is vascular congestion and hazy  opacity at the bases. No visible pneumothorax IMPRESSION: Cardiomegaly and vascular congestion. Atelectasis, pleural fluid, or infection could be present at the bases. Electronically Signed   By: Marnee Spring M.D.   On: 01/06/2020 05:17   DG CHEST PORT 1 VIEW  Result Date: 01/04/2020 CLINICAL DATA:  Tachypnea EXAM: PORTABLE CHEST 1 VIEW COMPARISON:  11/04/2017 FINDINGS: Cardiac shadow is enlarged. Increased vascular congestion is noted with interstitial edema consistent with congestive failure. No focal infiltrate is seen. No acute bony abnormality is noted. Degenerative changes of the thoracic spine are seen. IMPRESSION: Changes consistent with CHF. Electronically Signed   By: Alcide Clever M.D.   On: 01/04/2020 22:22   DG Foot Complete Left  Result Date: 01/04/2020 CLINICAL DATA:  Ulcer along the left heel and at the toes. EXAM: LEFT FOOT - COMPLETE 3+ VIEW COMPARISON:  Left foot radiographs 01/03/2012. FINDINGS: Postoperative changes of the distal  tibia are again noted. Fractured screw is stable. Prominent plantar calcaneal spur is noted. Soft tissue ulceration is evident without penetration of the bone. No acute or focal osseous abnormalities are associated. Extensive soft tissue swelling is present over the foot. Ulcerations involving the digits are not well appreciated due to overlap. No focal osseous erosion is evident. IMPRESSION: 1. Plantar ulceration at the calcaneus without acute osseous abnormality. 2. Prominent plantar spur at the calcaneus. 3. Extensive soft tissue swelling over the foot without other acute or focal osseous abnormality. Electronically Signed   By: Marin Robertshristopher  Mattern M.D.   On: 01/04/2020 09:22   ECHOCARDIOGRAM COMPLETE  Result Date: 01/05/2020    ECHOCARDIOGRAM REPORT   Patient Name:   [name] Date of Exam: 01/05/2020 Medical Rec #:  [id number]           Height:       64.0 in Accession #:    [id number]          Weight:       254.4 lb Date of Birth:  02/21/1951             BSA:          2.167 m Patient Age:    69 years            BP:           136/40 mmHg Patient Gender: F                   HR:           92 bpm. Exam Location:  [facility] Procedure: 2D Echo, Cardiac Doppler, Color Doppler and Intracardiac            Opacification Agent Indications:     R94.31 Abnormla EKG  History:         Patient has prior history of Echocardiogram examinations, most                  recent 11/04/2017. Risk Factors:Hypertension, Dyslipidemia,                  Diabetes and Obesity. Obesity hypoventilation syndrome. Atrial                  Fibrillation.  Sonographer:     Sedonia SmallNaTashia Rodgers-Jones Referring Phys:  [id number] Lynn ItoSAHAR AMERY Diagnosing Phys: Alwyn Peawayne D Callwood MD IMPRESSIONS  1. Left ventricular ejection fraction, by estimation, is 65 to 70%. The left ventricle has normal function. The left ventricle has no regional wall motion abnormalities. Left ventricular diastolic parameters are consistent with Grade I diastolic dysfunction (impaired relaxation).  2. Right ventricular systolic function is normal. The right ventricular size is normal. There is moderately elevated pulmonary artery systolic pressure.  3. The mitral valve is normal in structure. Trivial mitral valve regurgitation.  4. The aortic valve is normal in structure. Aortic valve regurgitation is not visualized. FINDINGS  Left Ventricle: Left ventricular ejection fraction, by estimation, is 65 to 70%. The left ventricle has normal function. The left ventricle has no regional wall motion abnormalities. Definity contrast agent was given IV to delineate the left ventricular  endocardial borders. The left ventricular internal cavity size was normal in size. There is no left ventricular hypertrophy. Left ventricular diastolic parameters are consistent with Grade I diastolic dysfunction (impaired relaxation). Right Ventricle: The right ventricular size is normal. No increase in right ventricular wall thickness. Right ventricular systolic  function is normal. There is moderately elevated pulmonary artery  systolic pressure. The tricuspid regurgitant velocity is 2.96 m/s, and with an assumed right atrial pressure of 10 mmHg, the estimated right ventricular systolic pressure is 45.0 mmHg. Left Atrium: Left atrial size was normal in size. Right Atrium: Right atrial size was normal in size. Pericardium: There is no evidence of pericardial effusion. Mitral Valve: The mitral valve is normal in structure. Trivial mitral valve regurgitation. Tricuspid Valve: The tricuspid valve is normal in structure. Tricuspid valve regurgitation is mild. Aortic Valve: The aortic valve is normal in structure. Aortic valve regurgitation is not visualized. Aortic valve mean gradient measures 10.2 mmHg. Aortic valve peak gradient measures 16.0 mmHg. Aortic valve area, by VTI measures 1.86 cm. Pulmonic Valve: The pulmonic valve was normal in structure. Pulmonic valve regurgitation is not visualized. Aorta: The aortic root is normal in size and structure. IAS/Shunts: The atrial septum is grossly normal.  LEFT VENTRICLE PLAX 2D LVIDd:         4.56 cm  Diastology LVIDs:         2.79 cm  LV e' lateral:   7.40 cm/s LV PW:         1.03 cm  LV E/e' lateral: 19.1 LV IVS:        0.98 cm  LV e' medial:    5.55 cm/s LVOT diam:     1.80 cm  LV E/e' medial:  25.4 LV SV:         69 LV SV Index:   32 LVOT Area:     2.54 cm  RIGHT VENTRICLE RV Basal diam:  3.43 cm RV S prime:     14.10 cm/s TAPSE (M-mode): 2.3 cm LEFT ATRIUM             Index       RIGHT ATRIUM           Index LA diam:        4.10 cm 1.89 cm/m  RA Area:     15.80 cm LA Vol (A2C):   61.3 ml 28.29 ml/m RA Volume:   42.20 ml  19.47 ml/m LA Vol (A4C):   49.2 ml 22.70 ml/m LA Biplane Vol: 56.6 ml 26.12 ml/m  AORTIC VALVE AV Area (Vmax):    1.77 cm AV Area (Vmean):   1.72 cm AV Area (VTI):     1.86 cm AV Vmax:           199.75 cm/s AV Vmean:          153.500 cm/s AV VTI:            0.371 m AV Peak Grad:      16.0 mmHg AV  Mean Grad:      10.2 mmHg LVOT Vmax:         139.00 cm/s LVOT Vmean:        104.000 cm/s LVOT VTI:          0.271 m LVOT/AV VTI ratio: 0.73  AORTA Ao Root diam: 2.90 cm MITRAL VALVE                TRICUSPID VALVE MV Area (PHT): 3.65 cm     TR Peak grad:   35.0 mmHg MV Decel Time: 208 msec     TR Vmax:        296.00 cm/s MV E velocity: 141.00 cm/s MV A velocity: 122.00 cm/s  SHUNTS MV E/A ratio:  1.16         Systemic VTI:  0.27 m  Systemic Diam: 1.80 cm Alwyn Pea MD Electronically signed by Alwyn Pea MD Signature Date/Time: 01/05/2020/10:58:40 PM    Final     Assessment & Plan      69 year old, caucasian, female with PMH significant for chronic atrial fibrillation (on eliquis), COPD with chronic respiratory failure, DM Type II, HTN, PVD with chronic stasis edema, morbid obesity, and depression who was admitted to [facility] for symptomatic anemia secondary to excessive vaginal bleeding that was further complicated by chronic anticoagulant use.  1. Atrial fibrillation-currently back on Eliquis and tolerating well. Had converted to A. fib with RVR. This a.m. appears to be in sinus rhythm.  Has runs of narrow complex tachycardia nonsustained.  Does not appear to be ventricular tachycardia. We will continue with metoprolol succinate  50 mg twice daily and will  Cardizem   120 daily.  Would agree with place of CPAP to see if this will help with her nocturnal arrhythmia.  2. Anemia-hemoglobin  8.6 today. As per above, will continue to follow on Eliquis.  3. CHF-BNP decreased to 481 down from 559 on admission.. No clinical evidence of heart failure at present. Has diuresed 1.378 L since admission. Continue with torsemide following renal function, electrolytes and hemodynamics.  Creatinine currently improved to 1.07.  GFR greater than 60.  Potassium is in normal range.  Will follow.  Will check BNP in the morning.  4. Hypotension-follow hemodynamics on  Cardizem.    Will follow.  Signed, Darlin Priestly Maryam Feely MD 01/09/2020, 11:54 AM  Pager: [phone]

## 2020-01-09 NOTE — Plan of Care (Signed)
Pt has been wearing home BiPAP throughout the afternoon, and sitting up in recliner.  Pt is alert and responsive compared to her previous drowsiness.  Appetite is improved. Husband at bedside.  Problem: Education: Goal: Knowledge of General Education information will improve Description: Including pain rating scale, medication(s)/side effects and non-pharmacologic comfort measures Outcome: Progressing   Problem: Health Behavior/Discharge Planning: Goal: Ability to manage health-related needs will improve Outcome: Progressing   Problem: Clinical Measurements: Goal: Ability to maintain clinical measurements within normal limits will improve Outcome: Progressing Goal: Will remain free from infection Outcome: Progressing Goal: Diagnostic test results will improve Outcome: Progressing Goal: Respiratory complications will improve Outcome: Progressing Goal: Cardiovascular complication will be avoided Outcome: Progressing   Problem: Activity: Goal: Risk for activity intolerance will decrease Outcome: Progressing   Problem: Nutrition: Goal: Adequate nutrition will be maintained Outcome: Progressing   Problem: Coping: Goal: Level of anxiety will decrease Outcome: Progressing   Problem: Coping: Goal: Level of anxiety will decrease Outcome: Progressing   Problem: Elimination: Goal: Will not experience complications related to bowel motility Outcome: Progressing Goal: Will not experience complications related to urinary retention Outcome: Progressing   Problem: Pain Managment: Goal: General experience of comfort will improve Outcome: Progressing   Problem: Safety: Goal: Ability to remain free from injury will improve Outcome: Progressing   Problem: Skin Integrity: Goal: Risk for impaired skin integrity will decrease Outcome: Progressing

## 2020-01-09 NOTE — Progress Notes (Signed)
PROGRESS NOTE    Jasmine Osborn  IWP:[id number] DOB: 10/29/1950 DOA: 01/03/2020 PCP: Kirk Ruths, MD    Brief Narrative:  Jasmine Osborn is a 69 y.o. female with medical history significant of chronic atrial fibrillation on anticoagulation, chronic respiratory failure due to COPD, diabetes, morbid obesity, depression, hypertension, peripheral vascular disease, chronic stasis edema who came to the ER secondary to excessive vaginal bleed.      Consultants:   GYN  Procedures: transfusion  Antimicrobials:   cefazolin   Subjective: Tele strip reviwed, had PSVT. Pt sitting in bed.  Husband about bringing BiPAP from home as her arrhythmias are mostly because during her sleep hours and she has  sleep apnea.  No chest pain, or shortness of breath per patient.  Objective: Vitals:   01/09/20 0454 01/09/20 0745 01/09/20 1130 01/09/20 1136  BP: (!) 162/65 (!) 151/40  (!) 161/43  Pulse:  95  90  Resp:  19  18  Temp: 98.5 F (36.9 C) (!) 97.5 F (36.4 C)  97.6 F (36.4 C)  TempSrc: Oral Oral    SpO2: 92% 93% 92% 93%  Weight: 113 kg     Height:        Intake/Output Summary (Last 24 hours) at 01/09/2020 1332 Last data filed at 01/09/2020 1136 Gross per 24 hour  Intake 69.27 ml  Output 200 ml  Net -130.73 ml   Filed Weights   01/07/20 0400 01/08/20 0533 01/09/20 0454  Weight: 116 kg 118.1 kg 113 kg    Examination:  General exam: Appears calm and comfortable , sitting in chair, nad, husband at bedside Respiratory system: minimal crackles  Scattered. No wheezing Cardiovascular system: S1 & S2 heard, RRR. No murmurs, rubs, gallops or clicks.  Gastrointestinal system: Abdomen is ndistended, soft and nontender.. Normal bowel sounds heard. Central nervous system: Awakens. Grossly intact  Extremities: +b/l edema.  Skin: Warm dry Psychiatry: Mood & affect appropriate in current setting.     Data Reviewed: I have personally reviewed following labs and imaging  studies  CBC: Recent Labs  Lab 01/04/20 2337 01/05/20 0620 01/06/20 0458 01/07/20 0448 01/09/20 0533  WBC 6.9 6.3 8.0 6.7 5.2  HGB 8.3* 8.2* 7.8* 7.6* 8.6*  HCT 29.4* 28.5* 28.2* 27.4* 30.2*  MCV 99.7 99.0 100.7* 102.2* 99.0  PLT 173 161 170 164 505   Basic Metabolic Panel: Recent Labs  Lab 01/04/20 0716 01/05/20 0620 01/06/20 0458 01/08/20 0437 01/09/20 0533  NA 142 145 143 143 145  K 4.8 4.6 4.7 4.2 4.1  CL 110 110 107 107 105  CO2 '25 29 29 30 31  ' GLUCOSE 97 101* 180* 100* 122*  BUN 27* 19 21 26* 16  CREATININE 1.82* 1.58* 1.52* 1.38* 1.07*  CALCIUM 8.6* 8.5* 8.6* 8.5* 9.2  MG  --  2.0 2.1  --  1.9   GFR: Estimated Creatinine Clearance: 61.1 mL/min (A) (by C-G formula based on SCr of 1.07 mg/dL (H)). Liver Function Tests: Recent Labs  Lab 01/04/20 0716  AST 13*  ALT 12  ALKPHOS 35*  BILITOT 0.9  PROT 5.8*  ALBUMIN 2.9*   No results for input(s): LIPASE, AMYLASE in the last 168 hours. No results for input(s): AMMONIA in the last 168 hours. Coagulation Profile: Recent Labs  Lab 01/03/20 1448  INR 1.8*   Cardiac Enzymes: No results for input(s): CKTOTAL, CKMB, CKMBINDEX, TROPONINI in the last 168 hours. BNP (last 3 results) No results for input(s): PROBNP in the last 8760 hours. HbA1C:  No results for input(s): HGBA1C in the last 72 hours. CBG: Recent Labs  Lab 01/03/20 2245  GLUCAP 140*   Lipid Profile: No results for input(s): CHOL, HDL, LDLCALC, TRIG, CHOLHDL, LDLDIRECT in the last 72 hours. Thyroid Function Tests: No results for input(s): TSH, T4TOTAL, FREET4, T3FREE, THYROIDAB in the last 72 hours. Anemia Panel: No results for input(s): VITAMINB12, FOLATE, FERRITIN, TIBC, IRON, RETICCTPCT in the last 72 hours. Sepsis Labs: No results for input(s): PROCALCITON, LATICACIDVEN in the last 168 hours.  Recent Results (from the past 240 hour(s))  Respiratory Panel by RT PCR (Flu A&B, Covid) - Nasopharyngeal Swab     Status: None   Collection  Time: 01/03/20  5:26 PM   Specimen: Nasopharyngeal Swab  Result Value Ref Range Status   SARS Coronavirus 2 by RT PCR NEGATIVE NEGATIVE Final    Comment: (NOTE) SARS-CoV-2 target nucleic acids are NOT DETECTED. The SARS-CoV-2 RNA is generally detectable in upper respiratoy specimens during the acute phase of infection. The lowest concentration of SARS-CoV-2 viral copies this assay can detect is 131 copies/mL. A negative result does not preclude SARS-Cov-2 infection and should not be used as the sole basis for treatment or other patient management decisions. A negative result may occur with  improper specimen collection/handling, submission of specimen other than nasopharyngeal swab, presence of viral mutation(s) within the areas targeted by this assay, and inadequate number of viral copies (<131 copies/mL). A negative result must be combined with clinical observations, patient history, and epidemiological information. The expected result is Negative. Fact Sheet for Patients:  PinkCheek.be Fact Sheet for Healthcare Providers:  GravelBags.it This test is not yet ap proved or cleared by the [place] FDA and  has been authorized for detection and/or diagnosis of SARS-CoV-2 by FDA under an Emergency Use Authorization (EUA). This EUA will remain  in effect (meaning this test can be used) for the duration of the COVID-19 declaration under Section 564(b)(1) of the Act, 21 U.S.C. section 360bbb-3(b)(1), unless the authorization is terminated or revoked sooner.    Influenza A by PCR NEGATIVE NEGATIVE Final   Influenza B by PCR NEGATIVE NEGATIVE Final    Comment: (NOTE) The Xpert Xpress SARS-CoV-2/FLU/RSV assay is intended as an aid in  the diagnosis of influenza from Nasopharyngeal swab specimens and  should not be used as a sole basis for treatment. Nasal washings and  aspirates are unacceptable for Xpert Xpress  SARS-CoV-2/FLU/RSV  testing. Fact Sheet for Patients: PinkCheek.be Fact Sheet for Healthcare Providers: GravelBags.it This test is not yet approved or cleared by the [place] FDA and  has been authorized for detection and/or diagnosis of SARS-CoV-2 by  FDA under an Emergency Use Authorization (EUA). This EUA will remain  in effect (meaning this test can be used) for the duration of the  Covid-19 declaration under Section 564(b)(1) of the Act, 21  U.S.C. section 360bbb-3(b)(1), unless the authorization is  terminated or revoked. Performed at [facility], [address] [place], [place] [address]   MRSA PCR Screening     Status: None   Collection Time: 01/03/20 10:54 PM   Specimen: Nasopharyngeal  Result Value Ref Range Status   MRSA by PCR NEGATIVE NEGATIVE Final    Comment:        The GeneXpert MRSA Assay (FDA approved for NASAL specimens only), is one component of a comprehensive MRSA colonization surveillance program. It is not intended to diagnose MRSA infection nor to guide or monitor treatment for MRSA infections. Performed at  [facility], [address], [place], [place] [address]   Respiratory Panel by RT PCR (Flu A&B, Covid) - Nasopharyngeal Swab     Status: None   Collection Time: 01/08/20 12:05 PM   Specimen: Nasopharyngeal Swab  Result Value Ref Range Status   SARS Coronavirus 2 by RT PCR NEGATIVE NEGATIVE Final    Comment: (NOTE) SARS-CoV-2 target nucleic acids are NOT DETECTED. The SARS-CoV-2 RNA is generally detectable in upper respiratoy specimens during the acute phase of infection. The lowest concentration of SARS-CoV-2 viral copies this assay can detect is 131 copies/mL. A negative result does not preclude SARS-Cov-2 infection and should not be used as the sole basis for treatment or other patient management decisions. A negative result may occur with  improper specimen  collection/handling, submission of specimen other than nasopharyngeal swab, presence of viral mutation(s) within the areas targeted by this assay, and inadequate number of viral copies (<131 copies/mL). A negative result must be combined with clinical observations, patient history, and epidemiological information. The expected result is Negative. Fact Sheet for Patients:  PinkCheek.be Fact Sheet for Healthcare Providers:  GravelBags.it This test is not yet ap proved or cleared by the [place] FDA and  has been authorized for detection and/or diagnosis of SARS-CoV-2 by FDA under an Emergency Use Authorization (EUA). This EUA will remain  in effect (meaning this test can be used) for the duration of the COVID-19 declaration under Section 564(b)(1) of the Act, 21 U.S.C. section 360bbb-3(b)(1), unless the authorization is terminated or revoked sooner.    Influenza A by PCR NEGATIVE NEGATIVE Final   Influenza B by PCR NEGATIVE NEGATIVE Final    Comment: (NOTE) The Xpert Xpress SARS-CoV-2/FLU/RSV assay is intended as an aid in  the diagnosis of influenza from Nasopharyngeal swab specimens and  should not be used as a sole basis for treatment. Nasal washings and  aspirates are unacceptable for Xpert Xpress SARS-CoV-2/FLU/RSV  testing. Fact Sheet for Patients: PinkCheek.be Fact Sheet for Healthcare Providers: GravelBags.it This test is not yet approved or cleared by the [place] FDA and  has been authorized for detection and/or diagnosis of SARS-CoV-2 by  FDA under an Emergency Use Authorization (EUA). This EUA will remain  in effect (meaning this test can be used) for the duration of the  Covid-19 declaration under Section 564(b)(1) of the Act, 21  U.S.C. section 360bbb-3(b)(1), unless the authorization is  terminated or revoked. Performed at [facility],  [address], [place], [place] [address]          Radiology Studies: No results found.      Scheduled Meds: . sodium chloride   Intravenous Once  . apixaban  5 mg Oral BID  . cephALEXin  500 mg Oral Q8H  . chlorhexidine  15 mL Mouth Rinse BID  . Chlorhexidine Gluconate Cloth  6 each Topical Q0600  . collagenase   Topical Daily  . cyanocobalamin  1,000 mcg Intramuscular Daily  . diltiazem  120 mg Oral Daily  . fenofibrate  160 mg Oral Daily  . latanoprost  1 drop Both Eyes QHS  . magnesium oxide  400 mg Oral BID  . mouth rinse  15 mL Mouth Rinse q12n4p  . metoprolol succinate  50 mg Oral BID  . mirtazapine  30 mg Oral QHS  . multivitamin-lutein  1 capsule Oral Daily  . potassium chloride SA  20 mEq Oral Daily  . risperiDONE  1 mg Oral QHS  . torsemide  20  mg Oral Daily  . tranexamic acid  1,300 mg Oral TID   Continuous Infusions: . sodium chloride Stopped (01/08/20 1355)    Assessment & Plan:   Principal Problem:   Symptomatic anemia Active Problems:   Persistent atrial fibrillation (HCC)   Morbid obesity (HCC)   Vaginal bleeding   ARF (acute renal failure) (HCC)   Cellulitis of left lower extremity   Skin ulcer of toe of left foot, limited to breakdown of skin (HCC)   AF (paroxysmal atrial fibrillation) ([place])   1. Symptomatic anemia with a hemoglobin of 4.2. Patient given transfusions (3 units of packed red blood cells) and hemoglobin up to 8 today.  Secondary to vaginal bleeding.  Status dilatation and curettage/hysteroscopy , vaginal bleed resolved. FYN following. D/c foley.progesterone IUD is in place . Per Gyn, stop Megace on discharge. 2. Iron deficiency anemia -given  IV iron  3. Vitamin B12 deficiency give B12 injections,need to be on po on discharge 4. Acute on chronic kidney injury continue to monitor. close to baselineHold losartan for now.  5. 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 Echo with nml EF.cards. mildly vol. Overloaded today.  Likely from PSVT . Will give lasix 66m iv x1 and reasses. 6. PSVT/PNSVT-will increase beta blk to bid. Needs to wear bipap for her sleep apnea as these occur during her sleep. 7. - likely 2/2 anemia, chf.  On beta blk. Echo nml ef. Cards following. Continue beta blk. 8. Essential hypertension. On metoprolol 9. Left lower extremity ulcer with surrounding cellulitis started Ancef. X-ray negative for osteomyelitis. Check an ESR. Wound care consult. 10.COPD with chronic respiratory failure on 3 L of oxygen 11.Sleep apnea-husband to bring BiPAP from home for patient to use  12.paroxysmal atrial fibrillation -Per GYN Dr. BLeafy Rovia secure chat, can restart  Eliquis 24hrs post surgery.  Rate better controlled Continue Cardizem 120 mg daily 13.PT/OT: snf and continue beta blk.    DVT prophylaxis: eliquis started Code Status: Full Family Communication: husband updated at bedside Disposition Plan: SNF when medically stable. Barrier: was suppose to be discharged yesterday, but had afib rvr, now psvt. Also now volume overloaded.  Need to use lasix iv, increase meds. If stable will d/c in am      LOS: 6 days   Time spent: 45 minutes with more than 50% on CVineyard [place] MD [facility] Pager [phone]  If 7PM-7AM, please contact night-coverage [email or web address] Password [email or web address] 01/09/2020, 1:32 PM Patient ID: Jasmine Osborn female   DOB: [birth year] 69y.o.   MRN: [id number]

## 2020-01-09 NOTE — Plan of Care (Signed)
  Problem: Education: Goal: Knowledge of General Education information will improve Description: Including pain rating scale, medication(s)/side effects and non-pharmacologic comfort measures Outcome: Progressing   Problem: Clinical Measurements: Goal: Respiratory complications will improve Outcome: Progressing Goal: Cardiovascular complication will be avoided Outcome: Progressing   Problem: Safety: Goal: Ability to remain free from injury will improve Outcome: Progressing   

## 2020-01-10 LAB — BASIC METABOLIC PANEL
Anion gap: 11 (ref 5–15)
BUN: 15 mg/dL (ref 8–23)
CO2: 31 mmol/L (ref 22–32)
Calcium: 9.3 mg/dL (ref 8.9–10.3)
Chloride: 101 mmol/L (ref 98–111)
Creatinine, Ser: 1.18 mg/dL — ABNORMAL HIGH (ref 0.44–1.00)
GFR calc Af Amer: 54 mL/min — ABNORMAL LOW (ref >60)
GFR calc non Af Amer: 47 mL/min — ABNORMAL LOW (ref >60)
Glucose, Bld: 118 mg/dL — ABNORMAL HIGH (ref 70–99)
Potassium: 3.6 mmol/L (ref 3.5–5.1)
Sodium: 143 mmol/L (ref 135–145)

## 2020-01-10 LAB — BRAIN NATRIURETIC PEPTIDE: B Natriuretic Peptide: 413 pg/mL — ABNORMAL HIGH (ref 0.0–100.0)

## 2020-01-10 MED ORDER — HYDROCERIN EX CREA
TOPICAL_CREAM | Freq: Every day | CUTANEOUS | Status: DC
  Filled 2020-01-10: qty 113

## 2020-01-10 MED ORDER — HYDROCERIN EX CREA: 1.0000 "application " | TOPICAL_CREAM | Freq: Every day | CUTANEOUS | 0 refills | Status: AC

## 2020-01-10 MED ORDER — MAGNESIUM OXIDE 400 (241.3 MG) MG PO TABS: 400.0000 mg | ORAL_TABLET | Freq: Two times a day (BID) | ORAL | Status: AC

## 2020-01-10 MED ORDER — METOPROLOL SUCCINATE ER 50 MG PO TB24: 50.0000 mg | ORAL_TABLET | Freq: Two times a day (BID) | ORAL | 0 refills | Status: DC

## 2020-01-10 NOTE — Care Management Important Message (Signed)
Important Message  Patient Details  Name: Jasmine Osborn MRN: [id number] Date of Birth: [birth year]   Medicare Important Message Given:  Yes     Johnell Comings 01/10/2020, 12:11 PM

## 2020-01-10 NOTE — Plan of Care (Signed)
  Problem: Education: Goal: Knowledge of General Education information will improve Description Including pain rating scale, medication(s)/side effects and non-pharmacologic comfort measures Outcome: Progressing   Problem: Clinical Measurements: Goal: Diagnostic test results will improve Outcome: Progressing Goal: Respiratory complications will improve Outcome: Progressing   

## 2020-01-10 NOTE — Consult Note (Addendum)
WOC Nurse Consult Note: Reason for Consult: WOC consults have previously been performed on 3/30 for left foot wounds and 4/1 for pannus fold maceration.  Requested to assess left leg ulcer today.  Wound type: Left anterior calf without open wound or drainage when assessed. Some edema, generalized dry flaking skin with darker color; appearance consistent with venous stasis changes.  Dressing procedure/placement/frequency: Topical treatment orders provided for bedside nurses to perform as follows: Apply Eucerin cream to left leg Q day after bathing to assist with promoting healing to dry skin location.  Please re-consult if further assistance is needed.  Thank-you,  Cammie Mcgee MSN, RN, CWOCN, [place], CNS [phone]

## 2020-01-17 ENCOUNTER — Other Ambulatory Visit: Payer: Self-pay

## 2020-01-17 ENCOUNTER — Emergency Department: Payer: Medicare HMO

## 2020-01-17 ENCOUNTER — Inpatient Hospital Stay
Admission: EM | Admit: 2020-01-17 | Discharge: 2020-02-05 | DRG: 871 | Disposition: E | Payer: Medicare HMO | Source: Skilled Nursing Facility | Attending: Internal Medicine | Admitting: Internal Medicine

## 2020-01-17 DIAGNOSIS — R579 Shock, unspecified: Principal | ICD-10-CM

## 2020-01-17 DIAGNOSIS — E1122 Type 2 diabetes mellitus with diabetic chronic kidney disease: Secondary | ICD-10-CM | POA: Diagnosis present

## 2020-01-17 DIAGNOSIS — E662 Morbid (severe) obesity with alveolar hypoventilation: Secondary | ICD-10-CM | POA: Diagnosis present

## 2020-01-17 DIAGNOSIS — J811 Chronic pulmonary edema: Secondary | ICD-10-CM | POA: Diagnosis present

## 2020-01-17 DIAGNOSIS — E538 Deficiency of other specified B group vitamins: Secondary | ICD-10-CM | POA: Diagnosis present

## 2020-01-17 DIAGNOSIS — I130 Hypertensive heart and chronic kidney disease with heart failure and stage 1 through stage 4 chronic kidney disease, or unspecified chronic kidney disease: Secondary | ICD-10-CM | POA: Diagnosis present

## 2020-01-17 DIAGNOSIS — Z7189 Other specified counseling: Secondary | ICD-10-CM | POA: Diagnosis not present

## 2020-01-17 DIAGNOSIS — J189 Pneumonia, unspecified organism: Secondary | ICD-10-CM | POA: Diagnosis present

## 2020-01-17 DIAGNOSIS — Z79899 Other long term (current) drug therapy: Secondary | ICD-10-CM

## 2020-01-17 DIAGNOSIS — Z818 Family history of other mental and behavioral disorders: Secondary | ICD-10-CM

## 2020-01-17 DIAGNOSIS — D631 Anemia in chronic kidney disease: Secondary | ICD-10-CM | POA: Diagnosis present

## 2020-01-17 DIAGNOSIS — N179 Acute kidney failure, unspecified: Secondary | ICD-10-CM | POA: Diagnosis present

## 2020-01-17 DIAGNOSIS — Z7901 Long term (current) use of anticoagulants: Secondary | ICD-10-CM

## 2020-01-17 DIAGNOSIS — F29 Unspecified psychosis not due to a substance or known physiological condition: Secondary | ICD-10-CM | POA: Diagnosis present

## 2020-01-17 DIAGNOSIS — G9341 Metabolic encephalopathy: Secondary | ICD-10-CM | POA: Diagnosis present

## 2020-01-17 DIAGNOSIS — A419 Sepsis, unspecified organism: Secondary | ICD-10-CM | POA: Diagnosis present

## 2020-01-17 DIAGNOSIS — Z9981 Dependence on supplemental oxygen: Secondary | ICD-10-CM

## 2020-01-17 DIAGNOSIS — D696 Thrombocytopenia, unspecified: Secondary | ICD-10-CM | POA: Diagnosis present

## 2020-01-17 DIAGNOSIS — I4819 Other persistent atrial fibrillation: Secondary | ICD-10-CM | POA: Diagnosis present

## 2020-01-17 DIAGNOSIS — J9621 Acute and chronic respiratory failure with hypoxia: Secondary | ICD-10-CM | POA: Diagnosis present

## 2020-01-17 DIAGNOSIS — E785 Hyperlipidemia, unspecified: Secondary | ICD-10-CM | POA: Diagnosis present

## 2020-01-17 DIAGNOSIS — E1169 Type 2 diabetes mellitus with other specified complication: Secondary | ICD-10-CM | POA: Diagnosis not present

## 2020-01-17 DIAGNOSIS — J9622 Acute and chronic respiratory failure with hypercapnia: Secondary | ICD-10-CM | POA: Diagnosis present

## 2020-01-17 DIAGNOSIS — N950 Postmenopausal bleeding: Secondary | ICD-10-CM | POA: Diagnosis present

## 2020-01-17 DIAGNOSIS — N939 Abnormal uterine and vaginal bleeding, unspecified: Secondary | ICD-10-CM | POA: Diagnosis not present

## 2020-01-17 DIAGNOSIS — N1832 Chronic kidney disease, stage 3b: Secondary | ICD-10-CM | POA: Diagnosis present

## 2020-01-17 DIAGNOSIS — J440 Chronic obstructive pulmonary disease with acute lower respiratory infection: Secondary | ICD-10-CM | POA: Diagnosis present

## 2020-01-17 DIAGNOSIS — R6521 Severe sepsis with septic shock: Secondary | ICD-10-CM | POA: Diagnosis present

## 2020-01-17 DIAGNOSIS — J9600 Acute respiratory failure, unspecified whether with hypoxia or hypercapnia: Secondary | ICD-10-CM

## 2020-01-17 DIAGNOSIS — Z20822 Contact with and (suspected) exposure to covid-19: Secondary | ICD-10-CM | POA: Diagnosis present

## 2020-01-17 DIAGNOSIS — J449 Chronic obstructive pulmonary disease, unspecified: Secondary | ICD-10-CM | POA: Diagnosis not present

## 2020-01-17 DIAGNOSIS — Z66 Do not resuscitate: Secondary | ICD-10-CM | POA: Diagnosis present

## 2020-01-17 DIAGNOSIS — F329 Major depressive disorder, single episode, unspecified: Secondary | ICD-10-CM | POA: Diagnosis present

## 2020-01-17 DIAGNOSIS — L97429 Non-pressure chronic ulcer of left heel and midfoot with unspecified severity: Secondary | ICD-10-CM | POA: Diagnosis present

## 2020-01-17 DIAGNOSIS — Z515 Encounter for palliative care: Secondary | ICD-10-CM | POA: Diagnosis not present

## 2020-01-17 DIAGNOSIS — I129 Hypertensive chronic kidney disease with stage 1 through stage 4 chronic kidney disease, or unspecified chronic kidney disease: Secondary | ICD-10-CM | POA: Diagnosis present

## 2020-01-17 DIAGNOSIS — I509 Heart failure, unspecified: Secondary | ICD-10-CM | POA: Diagnosis not present

## 2020-01-17 DIAGNOSIS — R319 Hematuria, unspecified: Secondary | ICD-10-CM | POA: Diagnosis present

## 2020-01-17 DIAGNOSIS — J9811 Atelectasis: Secondary | ICD-10-CM | POA: Diagnosis present

## 2020-01-17 DIAGNOSIS — E1151 Type 2 diabetes mellitus with diabetic peripheral angiopathy without gangrene: Secondary | ICD-10-CM | POA: Diagnosis present

## 2020-01-17 DIAGNOSIS — Z87891 Personal history of nicotine dependence: Secondary | ICD-10-CM

## 2020-01-17 DIAGNOSIS — A4151 Sepsis due to Escherichia coli [E. coli]: Principal | ICD-10-CM | POA: Diagnosis present

## 2020-01-17 DIAGNOSIS — S0033XA Contusion of nose, initial encounter: Secondary | ICD-10-CM | POA: Diagnosis not present

## 2020-01-17 DIAGNOSIS — Z6841 Body Mass Index (BMI) 40.0 and over, adult: Secondary | ICD-10-CM | POA: Diagnosis not present

## 2020-01-17 DIAGNOSIS — E872 Acidosis: Secondary | ICD-10-CM | POA: Diagnosis present

## 2020-01-17 DIAGNOSIS — N800 Endometriosis of uterus: Secondary | ICD-10-CM | POA: Diagnosis present

## 2020-01-17 DIAGNOSIS — E11621 Type 2 diabetes mellitus with foot ulcer: Secondary | ICD-10-CM | POA: Diagnosis present

## 2020-01-17 DIAGNOSIS — Z8249 Family history of ischemic heart disease and other diseases of the circulatory system: Secondary | ICD-10-CM

## 2020-01-17 DIAGNOSIS — D649 Anemia, unspecified: Secondary | ICD-10-CM | POA: Diagnosis present

## 2020-01-17 DIAGNOSIS — N390 Urinary tract infection, site not specified: Secondary | ICD-10-CM

## 2020-01-17 DIAGNOSIS — E119 Type 2 diabetes mellitus without complications: Secondary | ICD-10-CM

## 2020-01-17 DIAGNOSIS — Z7984 Long term (current) use of oral hypoglycemic drugs: Secondary | ICD-10-CM

## 2020-01-17 LAB — COMPREHENSIVE METABOLIC PANEL
ALT: 14 U/L (ref 0–44)
AST: 39 U/L (ref 15–41)
Albumin: 3 g/dL — ABNORMAL LOW (ref 3.5–5.0)
Alkaline Phosphatase: 60 U/L (ref 38–126)
Anion gap: 13 (ref 5–15)
BUN: 32 mg/dL — ABNORMAL HIGH (ref 8–23)
CO2: 23 mmol/L (ref 22–32)
Calcium: 9.1 mg/dL (ref 8.9–10.3)
Chloride: 105 mmol/L (ref 98–111)
Creatinine, Ser: 2.47 mg/dL — ABNORMAL HIGH (ref 0.44–1.00)
GFR calc Af Amer: 22 mL/min — ABNORMAL LOW (ref >60)
GFR calc non Af Amer: 19 mL/min — ABNORMAL LOW (ref >60)
Glucose, Bld: 152 mg/dL — ABNORMAL HIGH (ref 70–99)
Potassium: 3.7 mmol/L (ref 3.5–5.1)
Sodium: 141 mmol/L (ref 135–145)
Total Bilirubin: 1.4 mg/dL — ABNORMAL HIGH (ref 0.3–1.2)
Total Protein: 6 g/dL — ABNORMAL LOW (ref 6.5–8.1)

## 2020-01-17 LAB — GLUCOSE, CAPILLARY: Glucose-Capillary: 129 mg/dL — ABNORMAL HIGH (ref 70–99)

## 2020-01-17 LAB — PROCALCITONIN: Procalcitonin: 105.95 ng/mL

## 2020-01-17 LAB — BRAIN NATRIURETIC PEPTIDE: B Natriuretic Peptide: 170 pg/mL — ABNORMAL HIGH (ref 0.0–100.0)

## 2020-01-17 LAB — CBC
HCT: 34.7 % — ABNORMAL LOW (ref 36.0–46.0)
Hemoglobin: 9.9 g/dL — ABNORMAL LOW (ref 12.0–15.0)
MCH: 28.3 pg (ref 26.0–34.0)
MCHC: 28.5 g/dL — ABNORMAL LOW (ref 30.0–36.0)
MCV: 99.1 fL (ref 80.0–100.0)
Platelets: 172 10*3/uL (ref 150–400)
RBC: 3.5 MIL/uL — ABNORMAL LOW (ref 3.87–5.11)
RDW: 19.8 % — ABNORMAL HIGH (ref 11.5–15.5)
WBC: 10.6 10*3/uL — ABNORMAL HIGH (ref 4.0–10.5)
nRBC: 0.5 % — ABNORMAL HIGH (ref 0.0–0.2)

## 2020-01-17 LAB — URINALYSIS, ROUTINE W REFLEX MICROSCOPIC
Bilirubin Urine: NEGATIVE
Glucose, UA: NEGATIVE mg/dL
Ketones, ur: NEGATIVE mg/dL
Nitrite: NEGATIVE
Protein, ur: 100 mg/dL — AB
RBC / HPF: >50 RBC/hpf — ABNORMAL HIGH (ref 0–5)
Specific Gravity, Urine: 1.008 (ref 1.005–1.030)
WBC, UA: >50 WBC/hpf — ABNORMAL HIGH (ref 0–5)
pH: 6 (ref 5.0–8.0)

## 2020-01-17 LAB — LACTIC ACID, PLASMA
Lactic Acid, Venous: 5.6 mmol/L (ref 0.5–1.9)
Lactic Acid, Venous: 5 mmol/L (ref 0.5–1.9)

## 2020-01-17 LAB — LIPASE, BLOOD: Lipase: 30 U/L (ref 11–51)

## 2020-01-17 MED ORDER — SODIUM CHLORIDE 0.9 % IV SOLN
250.0000 mL | INTRAVENOUS | Status: DC
  Administered 2020-01-18: 250 mL via INTRAVENOUS

## 2020-01-17 MED ORDER — VITAMIN B-12 1000 MCG PO TABS
1000.0000 ug | ORAL_TABLET | Freq: Every day | ORAL | Status: DC
  Administered 2020-01-18 – 2020-01-20 (×3): 1000 ug via ORAL
  Filled 2020-01-17 (×4): qty 1

## 2020-01-17 MED ORDER — IPRATROPIUM-ALBUTEROL 0.5-2.5 (3) MG/3ML IN SOLN
3.0000 mL | Freq: Four times a day (QID) | RESPIRATORY_TRACT | Status: DC | PRN
  Administered 2020-01-21: 19:00:00 3 mL via RESPIRATORY_TRACT
  Filled 2020-01-17: qty 3

## 2020-01-17 MED ORDER — POLYETHYLENE GLYCOL 3350 17 G PO PACK: 17.0000 g | PACK | Freq: Every day | ORAL | Status: DC | PRN

## 2020-01-17 MED ORDER — VANCOMYCIN VARIABLE DOSE PER UNSTABLE RENAL FUNCTION (PHARMACIST DOSING): Status: DC

## 2020-01-17 MED ORDER — LACTATED RINGERS IV BOLUS (SEPSIS)
1000.0000 mL | Freq: Once | INTRAVENOUS | Status: AC
  Administered 2020-01-17: 1000 mL via INTRAVENOUS

## 2020-01-17 MED ORDER — SODIUM CHLORIDE 0.9 % IV SOLN
2.0000 g | Freq: Once | INTRAVENOUS | Status: AC
  Administered 2020-01-17: 2 g via INTRAVENOUS
  Filled 2020-01-17: qty 2

## 2020-01-17 MED ORDER — VANCOMYCIN HCL IN DEXTROSE 1-5 GM/200ML-% IV SOLN
1000.0000 mg | Freq: Once | INTRAVENOUS | Status: AC
  Administered 2020-01-17: 1000 mg via INTRAVENOUS
  Filled 2020-01-17: qty 200

## 2020-01-17 MED ORDER — RISPERIDONE 1 MG PO TABS
1.0000 mg | ORAL_TABLET | Freq: Every day | ORAL | Status: DC
  Administered 2020-01-19 – 2020-01-20 (×2): 1 mg via ORAL
  Filled 2020-01-17 (×6): qty 1

## 2020-01-17 MED ORDER — HYDROCERIN EX CREA
1.0000 "application " | TOPICAL_CREAM | Freq: Every day | CUTANEOUS | Status: DC
  Administered 2020-01-20 – 2020-01-22 (×2): 1 via TOPICAL
  Filled 2020-01-17 (×2): qty 113

## 2020-01-17 MED ORDER — INSULIN ASPART 100 UNIT/ML ~~LOC~~ SOLN
0.0000 [IU] | SUBCUTANEOUS | Status: DC
  Administered 2020-01-17: 1 [IU] via SUBCUTANEOUS
  Administered 2020-01-18: 2 [IU] via SUBCUTANEOUS
  Administered 2020-01-18: 3 [IU] via SUBCUTANEOUS
  Administered 2020-01-18: 2 [IU] via SUBCUTANEOUS
  Administered 2020-01-19: 3 [IU] via SUBCUTANEOUS
  Administered 2020-01-19: 21:00:00 1 [IU] via SUBCUTANEOUS
  Administered 2020-01-19: 2 [IU] via SUBCUTANEOUS
  Administered 2020-01-19 (×3): 3 [IU] via SUBCUTANEOUS
  Administered 2020-01-20: 1 [IU] via SUBCUTANEOUS
  Administered 2020-01-20: 04:00:00 2 [IU] via SUBCUTANEOUS
  Filled 2020-01-17 (×12): qty 1

## 2020-01-17 MED ORDER — SODIUM CHLORIDE 0.9 % IV SOLN
25.0000 ug/min | INTRAVENOUS | Status: DC
  Administered 2020-01-18: 25 ug/min via INTRAVENOUS
  Filled 2020-01-17 (×2): qty 1

## 2020-01-17 MED ORDER — SODIUM CHLORIDE 0.9 % IV BOLUS
500.0000 mL | Freq: Once | INTRAVENOUS | Status: AC
  Administered 2020-01-17: 500 mL via INTRAVENOUS

## 2020-01-17 MED ORDER — SODIUM CHLORIDE 0.9 % IV BOLUS: 250.0000 mL | Freq: Once | INTRAVENOUS | Status: DC

## 2020-01-17 MED ORDER — METOPROLOL TARTRATE 5 MG/5ML IV SOLN: 2.5000 mg | Freq: Four times a day (QID) | INTRAVENOUS | Status: DC | PRN

## 2020-01-17 MED ORDER — APIXABAN 5 MG PO TABS
5.0000 mg | ORAL_TABLET | Freq: Two times a day (BID) | ORAL | Status: DC
  Administered 2020-01-18: 5 mg via ORAL
  Filled 2020-01-17: qty 1

## 2020-01-17 MED ORDER — SODIUM CHLORIDE 0.9 % IV SOLN
2.0000 g | Freq: Two times a day (BID) | INTRAVENOUS | Status: DC
  Administered 2020-01-18: 2 g via INTRAVENOUS
  Filled 2020-01-17 (×3): qty 2

## 2020-01-17 MED ORDER — ONDANSETRON HCL 4 MG/2ML IJ SOLN: 4.0000 mg | Freq: Four times a day (QID) | INTRAMUSCULAR | Status: DC | PRN

## 2020-01-17 MED ORDER — LACTATED RINGERS IV BOLUS (SEPSIS)
800.0000 mL | Freq: Once | INTRAVENOUS | Status: AC
  Administered 2020-01-17: 800 mL via INTRAVENOUS

## 2020-01-17 MED ORDER — DOCUSATE SODIUM 100 MG PO CAPS: 100.0000 mg | ORAL_CAPSULE | Freq: Two times a day (BID) | ORAL | Status: DC | PRN

## 2020-01-17 MED ORDER — LATANOPROST 0.005 % OP SOLN
1.0000 [drp] | Freq: Every day | OPHTHALMIC | Status: DC
  Administered 2020-01-18 – 2020-01-19 (×3): 1 [drp] via OPHTHALMIC
  Filled 2020-01-17 (×2): qty 2.5

## 2020-01-17 NOTE — ED Notes (Signed)
Husband at bedside.  

## 2020-01-17 NOTE — ED Provider Notes (Signed)
[facility] Emergency Department Provider Note   ____________________________________________   First MD Initiated Contact with Patient Jan 27, 2020 1902     (approximate)  I have reviewed the triage vital signs and the nursing notes.   HISTORY  Chief Complaint Hypotension  EM caveat patient very fatigued, somewhat poor historian  HPI Jasmine Osborn is a 69 y.o. female extensive recent medical history, A. fib, uterine bleeding, obesity hypoventilation syndrome acute renal failure  Patient presents today for concerns of hypotension.  Patient was evidently very sweaty pale and weak at [facility].  This prompted EMS call where the patient was found to be severely hypotensive but fluid responsive.  Patient herself reports no pain or discomfort.  She feels tired and fatigued.  Denies any other symptoms  No report or noted bleeding.   Spoke with patient's husband, he reports that he was told by the patient that she was not feeling too well that she was having symptoms of fatigue achiness throughout the day today.  Past Medical History:  Diagnosis Date  . Atrial fibrillation (HCC)   . Chronic respiratory failure with hypoxia and hypercapnia (HCC)   . Depression   . Diabetes mellitus without complication (HCC)   . Elevated lipids   . Hernia of abdominal wall   . Hypertension   . Increased endometrial stripe thickness 06/04/2017  . Increased endometrial stripe thickness 06/04/2017  . Obesity hypoventilation syndrome (HCC)   . Psychosis (HCC)   . PVD (peripheral vascular disease) (HCC)    heel ulcer Lt     Patient Active Problem List   Diagnosis Date Noted  . Septic shock (HCC) January 27, 2020  . Cellulitis of left lower extremity   . Skin ulcer of toe of left foot, limited to breakdown of skin (HCC)   . AF (paroxysmal atrial fibrillation) (HCC)   . Symptomatic anemia 01/03/2020  . Vaginal bleeding 01/03/2020  . ARF (acute renal failure) (HCC)  01/03/2020  . Anemia 12/16/2017  . Persistent atrial fibrillation (HCC) 11/05/2017  . Morbid obesity (HCC) 11/05/2017  . Acute on chronic respiratory failure with hypoxia and hypercapnia (HCC) 11/02/2017  . Increased endometrial stripe thickness 06/04/2017  . Pressure injury of skin 12/23/2016  . Respiratory failure with hypercapnia (HCC) 12/22/2016    Past Surgical History:  Procedure Laterality Date  . COLON SURGERY    . DILATION AND CURETTAGE OF UTERUS    . DILITATION & CURRETTAGE/HYSTROSCOPY WITH NOVASURE ABLATION N/A 01/05/2020   Procedure: DILATATION & CURETTAGE/HYSTEROSCOPY WITH NOVASURE ABLATION;  Surgeon: Christeen Douglas, MD;  Location: [facility] ORS;  Service: Gynecology;  Laterality: N/A;  . HERNIA REPAIR    . TIBIA FRACTURE SURGERY Left     Prior to Admission medications   Medication Sig Start Date End Date Taking? Authorizing Provider  apixaban (ELIQUIS) 5 MG TABS tablet Take 5 mg by mouth 2 (two) times daily.   Yes [provider]  cephALEXin (KEFLEX) 500 MG capsule Take 1 capsule (500 mg total) by mouth every 8 (eight) hours. 01/08/20  Yes Lynn Ito, MD  collagenase (SANTYL) ointment Apply topically daily. 01/09/20  Yes Lynn Ito, MD  diltiazem (CARDIZEM CD) 120 MG 24 hr capsule Take 1 capsule (120 mg total) by mouth daily. 01/08/20  Yes Lynn Ito, MD  fenofibrate 160 MG tablet Take 160 mg by mouth daily.   Yes [provider]  hydrocerin (EUCERIN) CREA Apply 1 application topically daily. 01/11/20  Yes Lynn Ito, MD  KLOR-CON 10 10 MEQ tablet Take  20 mEq by mouth daily.  10/22/16  Yes [provider]  latanoprost (XALATAN) 0.005 % ophthalmic solution USE 1 DROP IN [facility] EYE AT BEDTIME 05/18/15  Yes [provider]  magnesium oxide (MAG-OX) 400 (241.3 Mg) MG tablet Take 1 tablet (400 mg total) by mouth 2 (two) times daily. 01/10/20  Yes Lynn Ito, MD  metFORMIN (GLUCOPHAGE) 500 MG tablet Take 500 mg by mouth 2 (two) times daily with a  meal.   Yes [provider]  metoprolol succinate (TOPROL-XL) 50 MG 24 hr tablet Take 1 tablet (50 mg total) by mouth 2 (two) times daily. 01/10/20  Yes Lynn Ito, MD  mirtazapine (REMERON) 30 MG tablet Take 30 mg by mouth at bedtime.   Yes [provider]  multivitamin-lutein (OCUVITE-LUTEIN) CAPS capsule Take 1 capsule by mouth daily. 11/09/17  Yes Wieting, Richard, MD  nystatin (MYCOSTATIN/NYSTOP) powder Apply 1 application topically 3 (three) times daily.   Yes [provider]  risperiDONE (RISPERDAL) 1 MG tablet Take 1 tablet (1 mg total) by mouth at bedtime. 01/03/17  Yes Enid Baas, MD  torsemide (DEMADEX) 20 MG tablet Take 20 mg by mouth daily.   Yes [provider]  tranexamic acid (LYSTEDA) 650 MG TABS tablet Take 2 tablets (1,300 mg total) by mouth 3 (three) times daily. 01/08/20  Yes Lynn Ito, MD  vitamin B-12 (CYANOCOBALAMIN) 1000 MCG tablet Take 1 tablet (1,000 mcg total) by mouth daily. 01/08/20  Yes Lynn Ito, MD  vitamin C (ASCORBIC ACID) 250 MG tablet Take 500 mg by mouth 2 (two) times daily.   Yes [provider]  zinc sulfate 220 (50 Zn) MG capsule Take 220 mg by mouth daily.   Yes [provider]  metoprolol succinate (TOPROL-XL) 50 MG 24 hr tablet Take 50 mg by mouth daily. 09/04/17   [provider]    Allergies Patient has no known allergies.  Family History  Problem Relation Age of Onset  . Dementia Mother   . Valvular heart disease Father     Social History Social History   Tobacco Use  . Smoking status: Former Games developer  . Smokeless tobacco: Never Used  Substance Use Topics  . Alcohol use: No  . Drug use: No    Review of Systems  Patient denies shortness of breath.  Denies chest pain.  She thinks she might of had a fever today  No abdominal pain.  Reports some tenderness over the ulcer over her left foot heel   ____________________________________________   PHYSICAL  EXAM:  VITAL SIGNS: ED Triage Vitals  Enc Vitals Group     BP 02-13-20 1912 120/67     Pulse Rate 13-Feb-2020 1912 88     Resp Feb 13, 2020 1912 (!) 37     Temp 02/13/20 1912 99.4 F (37.4 C)     Temp Source 2020-02-13 1912 Oral     SpO2 02/13/20 1912 91 %     Weight 02/13/20 1913 248 lb 14.4 oz (112.9 kg)     Height Feb 13, 2020 1913 5\' 4"  (1.626 m)     Head Circumference --      [facility] --      Pain Score 02/13/20 1913 0     Pain Loc --      Pain Edu? --      Excl. in GC? --     Constitutional: Alert and oriented.  She appears chronically ill, also somewhat acutely ill pale and fatigued.  She is fully oriented though Eyes:  Conjunctivae are normal. Head: Atraumatic. Nose: No congestion/rhinnorhea. Mouth/Throat: Mucous membranes are moist. Neck: No stridor.  Rather kyphotic in appearance. Cardiovascular: Normal rate, regular rhythm. Grossly normal heart sounds.  Good peripheral circulation. Respiratory: Normal respiratory effort.  No retractions. Lungs CTAB.  She is very diminished lung sounds bilateral. Gastrointestinal: Soft and nontender. No distention. Musculoskeletal: No lower extremity tenderness but severe bilateral lower extremity edema.  Additionally she does have what appears to be slowly healing dry ulcer at the base of the left heel without surrounding erythema or purulent drainage.  Capillary refill in the digits of the toes all about 1 second. Neurologic:  Normal speech and language.  Very soft-spoken no gross focal neurologic deficits are appreciated. Diffusely weak in all extremities. Skin:  Skin is warm, dry and intact. No rash noted. Psychiatric: Mood and affect are very calm  ____________________________________________   LABS (all labs ordered are listed, but only abnormal results are displayed)  Labs Reviewed  CBC - Abnormal; Notable for the following components:      Result Value   WBC 10.6 (*)    RBC 3.50 (*)    Hemoglobin 9.9 (*)    HCT 34.7 (*)    MCHC  28.5 (*)    RDW 19.8 (*)    nRBC 0.5 (*)    All other components within normal limits  COMPREHENSIVE METABOLIC PANEL - Abnormal; Notable for the following components:   Glucose, Bld 152 (*)    BUN 32 (*)    Creatinine, Ser 2.47 (*)    Total Protein 6.0 (*)    Albumin 3.0 (*)    Total Bilirubin 1.4 (*)    GFR calc non Af Amer 19 (*)    GFR calc Af Amer 22 (*)    All other components within normal limits  LACTIC ACID, PLASMA - Abnormal; Notable for the following components:   Lactic Acid, Venous 5.6 (*)    All other components within normal limits  BRAIN NATRIURETIC PEPTIDE - Abnormal; Notable for the following components:   B Natriuretic Peptide 170.0 (*)    All other components within normal limits  CULTURE, BLOOD (ROUTINE X 2)  CULTURE, BLOOD (ROUTINE X 2)  SARS CORONAVIRUS 2 (TAT 6-24 HRS)  URINE CULTURE  LIPASE, BLOOD  PROCALCITONIN  LACTIC ACID, PLASMA  LACTIC ACID, PLASMA  LACTIC ACID, PLASMA  CBC WITH DIFFERENTIAL/PLATELET  PROTIME-INR  URINALYSIS, ROUTINE W REFLEX MICROSCOPIC  HEMOGLOBIN A1C  CBG MONITORING, ED   ____________________________________________  EKG  Reviewed inter by [id number] Heart rate 100 QRS 70 QTc 420 Some baseline wander, normal sinus, no evidence of acute ischemia denoted ____________________________________________  RADIOLOGY  DG Chest Portable 1 View  Result Date: 01/13/2020 CLINICAL DATA:  Tachypnea EXAM: PORTABLE CHEST 1 VIEW COMPARISON:  01/06/2020, CT chest 11/02/2017 FINDINGS: Cardiomegaly with vascular congestion and diffuse interstitial and hazy opacities suspicious for edema. More confluent airspace disease at the left base. No pneumothorax. IMPRESSION: 1. Cardiomegaly with vascular congestion and probable pulmonary edema. 2. Possible small pleural effusions. Hazy atelectasis or pneumonia at the bases. Overall the findings do not appear greatly changed from 01/06/2020. Electronically Signed   By: Jasmine Pang M.D.   On: 01/25/2020  19:37    X-ray reviewed, cardiomegaly vascular congestion and probable pulmonary edema.  Also he is atelectasis or pneumonia in the bases. ____________________________________________   PROCEDURES  Procedure(s) performed: 3 lead  .1-3 Lead EKG Interpretation Performed by: Sharyn Creamer, MD Authorized by: Sharyn Creamer, MD     Interpretation:  normal     ECG rate:  90   ECG rate assessment: normal     Rhythm: sinus rhythm     Ectopy: none     Conduction: normal      Critical Care performed: Yes, see critical care note(s)  CRITICAL CARE Performed by: Delman Kitten   Total critical care time: 30 minutes  Critical care time was exclusive of separately billable procedures and treating other patients.  Critical care was necessary to treat or prevent imminent or life-threatening deterioration.  Critical care was time spent personally by me on the following activities: development of treatment plan with patient and/or surrogate as well as nursing, discussions with consultants, evaluation of patient's response to treatment, examination of patient, obtaining history from patient or surrogate, ordering and performing treatments and interventions, ordering and review of laboratory studies, ordering and review of radiographic studies, pulse oximetry and re-evaluation of patient's condition.  ____________________________________________   INITIAL IMPRESSION / ASSESSMENT AND PLAN / ED COURSE  Pertinent labs & imaging results that were available during my care of the patient were reviewed by me and considered in my medical decision making (see chart for details).   Patient presents for concerns of hypotension, also patient reports possibly had a fever today.  Significant limitation, patient somewhat poor historian, but what is apparent is that she was not feeling well today for one reason or another, and EMS reports they noted patient significantly hypotensive after they were called out because  of her having an episode of diaphoresis.  Clinical Course as of Jan 17 2136  29-Jan-2020  1930 Has MOST form. DNR confirmed with patient. Discussed with husband Senaida Ores, affirms it would be ok to evaluate and treat with non-heroic measures.   [MQ]  2054 Discussed with Dr. Madalyn Rob (ICU), he will have CCM perform consult for treatment and level of care (ICU vs. [facility] admitting) recommendations   [MQ]  2105 Hinton Dyer (ICU NP) discussed case with me and plans to have ICU team admit patient.    [MQ]    Clinical Course User Index [MQ] Delman Kitten, MD   ----------------------------------------- 7:40 PM on 2020-01-29 -----------------------------------------  Patient once again noted to be hypotensive.  Have ordered fluid boluses.  Await laboratory testing imaging studies and EKG.  ----------------------------------------- 9:35 PM on 2020/01/29 -----------------------------------------  Vitals:   Jan 29, 2020 2045 29-Jan-2020 2100  BP:  (!) 117/53  Pulse: 92 91  Resp: (!) 39 (!) 37  Temp:    SpO2: 95% 95%    Patient demonstrating fluid responsiveness.  This vital signs improving.  On BiPAP, patient does use BiPAP in the evenings as well.  Concerned that she will develop volume overload, but also significant concern for sepsis and septic shock, empiric antibiotics given as we work to further identify a source.  I discussed the case and care with our hospitalist service, who deferred to ICU, and spoke with Dr. Madalyn Rob.  Hinton Dyer, nurse practitioner, from the ICU will be admitting.  Ongoing care including ongoing ED care signed Dr. Cherylann Banas  ____________________________________________   FINAL CLINICAL IMPRESSION(S) / ED DIAGNOSES  Final diagnoses:  Shock [facility])        Note:  This document was prepared using Dragon voice recognition software and may include unintentional dictation errors       Delman Kitten, MD 01-29-20 2137

## 2020-01-17 NOTE — ED Triage Notes (Signed)
Pt here via EMS from [facility], Ems were called due to pt profusely sweating. Pt was found to be hypotensive with a pressure as low as 46/28. After 500cc of NS pressure with EMS was 87/53, HR 95, RR 40, end tidal CO2 40. Pt alert to verbal stimulus.   Pt arrives with pink MOST form.   Pt appears weak and pale with increased work of breathing. Pt is very lethargic and sweaty.

## 2020-01-17 NOTE — H&P (Signed)
Name: Jasmine Osborn MRN: [id number] DOB: 1950-10-09    ADMISSION DATE:  01-19-2020 CONSULTATION DATE: January 19, 2020  REFERRING MD : Dr. Jacqualine Code   CHIEF COMPLAINT: Hypotension   BRIEF PATIENT DESCRIPTION: 69 yo female admitted with septic shock of   SIGNIFICANT EVENTS/STUDIES:  04/12: Pt admitted to ICU with septic shock secondary to UTI   HISTORY OF PRESENT ILLNESS:   This is a 69 yo female with a PMH of PVD, Psychosis, OHS, HTN, Abdominal Hernia, Hyperlipidemia, Chronic Atrial Fibrillation,Type II Diabetes Mellitus, Depression, and Morbid Obesity.  She was recently discharged from [facility] on 01/08/2020 to [facility] following treatment of symptomatic anemia secondary to acute vaginal bleeding complicated by outpatient eliquis requiring dilatation and curettage/hysteroscopy on 01/05/2020 with resolution of bleeding.  She presented back to [facility] ER on 04/12 from [facility] via EMS with diaphoresis and found to be severely hypotensive with sbp in the 40's.  EMS administered a 500 ml NS bolus with sbp increasing to the 80's. In the ER EKG showed normal sinus rhythm.  Lab results revealed glucose 152, BUN 32, creatinine 2.47, albumin 3.0, BNP 170, lactic acid 5.6, pct 105.95, wbc 10.6, and hgb 9.9.  CXR concerning for pulmonary edema and atelectasis vs. pneumonia. COVID-19 results pending. Pt also noted to have low-grade temperature along with intermittent hypotension sbp 80-120's.  Sepsis protocol initiated: pt received 1L NS bolus, 1.8L LR bolus, vancomycin, and cefepime.  Hypotension improved following treatment.  PCCM team contacted by ER physician for ICU admission.    PAST MEDICAL HISTORY :   has a past medical history of Atrial fibrillation ([place]), Chronic respiratory failure with hypoxia and hypercapnia ([place]), Depression, Diabetes mellitus without complication ([place]), Elevated lipids, Hernia of abdominal wall, Hypertension, Increased endometrial stripe thickness (06/04/2017), Increased  endometrial stripe thickness (06/04/2017), Obesity hypoventilation syndrome ([place]), Psychosis ([place]), and PVD (peripheral vascular disease) ([place]).  has a past surgical history that includes Tibia fracture surgery (Left); Hernia repair; Colon surgery; Dilation and curettage of uterus; and Dilatation & currettage/hysteroscopy with novasure ablation (N/A, 01/05/2020). Prior to Admission medications   Medication Sig Start Date End Date Taking? Authorizing Provider  apixaban (ELIQUIS) 5 MG TABS tablet Take 5 mg by mouth 2 (two) times daily.   Yes [provider]  cephALEXin (KEFLEX) 500 MG capsule Take 1 capsule (500 mg total) by mouth every 8 (eight) hours. 01/08/20  Yes Nolberto Hanlon, MD  collagenase (SANTYL) ointment Apply topically daily. 01/09/20  Yes Nolberto Hanlon, MD  diltiazem (CARDIZEM CD) 120 MG 24 hr capsule Take 1 capsule (120 mg total) by mouth daily. 01/08/20  Yes Nolberto Hanlon, MD  fenofibrate 160 MG tablet Take 160 mg by mouth daily.   Yes [provider]  hydrocerin (EUCERIN) CREA Apply 1 application topically daily. 01/11/20  Yes Amery, Gwynneth Albright, MD  KLOR-CON 10 10 MEQ tablet Take 20 mEq by mouth daily.  10/22/16  Yes [provider]  latanoprost (XALATAN) 0.005 % ophthalmic solution USE 1 DROP IN [facility] EYE AT BEDTIME 05/18/15  Yes [provider]  magnesium oxide (MAG-OX) 400 (241.3 Mg) MG tablet Take 1 tablet (400 mg total) by mouth 2 (two) times daily. 01/10/20  Yes Nolberto Hanlon, MD  metFORMIN (GLUCOPHAGE) 500 MG tablet Take 500 mg by mouth 2 (two) times daily with a meal.   Yes [provider]  metoprolol succinate (TOPROL-XL) 50 MG 24 hr tablet Take 1 tablet (50 mg total) by mouth 2 (two) times daily. 01/10/20  Yes Nolberto Hanlon, MD  mirtazapine (REMERON) 30 MG tablet Take 30 mg by mouth at bedtime.   Yes [provider]  multivitamin-lutein (OCUVITE-LUTEIN) CAPS capsule Take 1 capsule by mouth daily. 11/09/17  Yes Wieting, Richard, MD  nystatin  (MYCOSTATIN/NYSTOP) powder Apply 1 application topically 3 (three) times daily.   Yes [provider]  risperiDONE (RISPERDAL) 1 MG tablet Take 1 tablet (1 mg total) by mouth at bedtime. 01/03/17  Yes Enid Baas, MD  torsemide (DEMADEX) 20 MG tablet Take 20 mg by mouth daily.   Yes [provider]  tranexamic acid (LYSTEDA) 650 MG TABS tablet Take 2 tablets (1,300 mg total) by mouth 3 (three) times daily. 01/08/20  Yes Lynn Ito, MD  vitamin B-12 (CYANOCOBALAMIN) 1000 MCG tablet Take 1 tablet (1,000 mcg total) by mouth daily. 01/08/20  Yes Lynn Ito, MD  vitamin C (ASCORBIC ACID) 250 MG tablet Take 500 mg by mouth 2 (two) times daily.   Yes [provider]  zinc sulfate 220 (50 Zn) MG capsule Take 220 mg by mouth daily.   Yes [provider]  metoprolol succinate (TOPROL-XL) 50 MG 24 hr tablet Take 50 mg by mouth daily. 09/04/17   [provider]   No Known Allergies  FAMILY HISTORY:  family history includes Dementia in her mother; Valvular heart disease in her father. SOCIAL HISTORY:  reports that she has quit smoking. She has never used smokeless tobacco. She reports that she does not drink alcohol or use drugs.  REVIEW OF SYSTEMS: Positives in BOLD  Constitutional: fever, chills, weight loss, malaise/fatigue and diaphoresis.  HENT: Negative for hearing loss, ear pain, nosebleeds, congestion, sore throat, neck pain, tinnitus and ear discharge.   Eyes: Negative for blurred vision, double vision, photophobia, pain, discharge and redness.  Respiratory: Negative for cough, hemoptysis, sputum production, shortness of breath, wheezing and stridor.   Cardiovascular: Negative for chest pain, palpitations, orthopnea, claudication, leg swelling and PND.  Gastrointestinal: Negative for heartburn, nausea, vomiting, abdominal pain, diarrhea, constipation, blood in stool and melena.  Genitourinary: Negative for dysuria, urgency, frequency, hematuria  and flank pain.  Musculoskeletal: Negative for myalgias, back pain, joint pain and falls.  Skin: Negative for itching and rash.  Neurological: Negative for dizziness, tingling, tremors, sensory change, speech change, focal weakness, seizures, loss of consciousness, weakness and headaches.  Endo/Heme/Allergies: Negative for environmental allergies and polydipsia. Does not bruise/bleed easily.  SUBJECTIVE:  Currently on Bipap no complaints at this time   VITAL SIGNS: Temp:  [99.4 F (37.4 C)] 99.4 F (37.4 C) (04/12 1912) Pulse Rate:  [86-97] 97 (04/12 2130) Resp:  [32-39] 39 (04/12 2130) BP: (88-120)/(38-67) 104/59 (04/12 2130) SpO2:  [89 %-96 %] 96 % (04/12 2130) Weight:  [112.9 kg] 112.9 kg (04/12 1913)  PHYSICAL EXAMINATION: General: chronically ill appearing obese female, NAD on Bipap  Neuro: lethargic, follows commands, PERRL HEENT: unable to assess JVD pt with large neck  Cardiovascular: nsr, rrr, no R/G Lungs: diminished throughout, even, non labored  Abdomen: +BS x4, obese, soft, non tender, non distended  Musculoskeletal: 2+ bilateral lower extremity edema  Skin: intact no rashes or lesions present   Recent Labs  Lab 01/24/2020 1916  NA 141  K 3.7  CL 105  CO2 23  BUN 32*  CREATININE 2.47*  GLUCOSE 152*   Recent Labs  Lab 01/16/2020 1916  HGB 9.9*  HCT 34.7*  WBC 10.6*  PLT 172   DG Chest Portable 1 View  Result Date: 01/16/2020 CLINICAL DATA:  Tachypnea EXAM: PORTABLE  CHEST 1 VIEW COMPARISON:  01/06/2020, CT chest 11/02/2017 FINDINGS: Cardiomegaly with vascular congestion and diffuse interstitial and hazy opacities suspicious for edema. More confluent airspace disease at the left base. No pneumothorax. IMPRESSION: 1. Cardiomegaly with vascular congestion and probable pulmonary edema. 2. Possible small pleural effusions. Hazy atelectasis or pneumonia at the bases. Overall the findings do not appear greatly changed from 01/06/2020. Electronically Signed   By: Jasmine Pang M.D.   On: Jan 27, 2020 19:37    ASSESSMENT / PLAN:  Acute on chronic respiratory failure secondary to pulmonary edema and pickwickian syndrome   Hx: Morbid Obesity Supplemental O2 for dyspnea and/or hypoxia  CPAP or Bipap qhs Prn bronchodilator therapy  Maintain airborne and contact precautions for now   Hypotension secondary to urosepsis  Hx: PVD, HTN, Hyperlipidemia, and Chronic Atrial Fibrillation Continuous telemetry monitoring  Hold outpatient antihypertensives and cardizem for now  Prn metoprolol for sustained hr >120 bpm Continue outpatient eliquis  Gentle fluid rehydration and prn peripheral neo-synephrine to maintain map >65  Acute on chronic renal failure  Lactic acidosis  Trend BMP and lactic acid  Replace electrolytes as indicated Monitor UOP Avoid nephrotoxic medications   Leukocytosis secondary to UTI  Trend WBC and monitor fever curve Trend PCT  Follow cultures  Continue vancomycin and cefepime for now; if MRSA PCR negative will discontinue vancomycin   Anemia without obvious acute blood loss Trend CBC Monitor for s/sx of bleeding  Transfuse for hgb <7 and/or signs of active bleeding   Type II Diabetes Mellitus  CBG q4hrs  SSI   Depression  Psychosis  Continue outpatient risperdal   Best Practice: VTE px: outpatient eliquis  SUP px: not indicated  Diet: heart healthy once off of Bipap   Sonda Rumble, AGNP  Pulmonary/Critical Care Pager [phone] (please enter 7 digits) PCCM Consult Pager [phone] (please enter 7 digits)

## 2020-01-17 NOTE — Progress Notes (Signed)
PHARMACY -  BRIEF ANTIBIOTIC NOTE   Pharmacy has received consult(s) for Vancomycin and Cefepime from an ED provider.  The patient's profile has been reviewed for ht/wt/allergies/indication/available labs.    One time order(s) placed for Vancomycin 2g IV(to be given as two separate 1g doses) and Cefepime 2g IV x 1.  Further antibiotics/pharmacy consults should be ordered by admitting physician if indicated.                       Thank you, Bettey Costa 01/26/2020  8:32 PM

## 2020-01-17 NOTE — Progress Notes (Signed)
Pharmacy Antibiotic Note  Jasmine Osborn is a 69 y.o. female admitted on 02/01/2020 with sepsis.  Pharmacy has been consulted for vancomycin and cefepime dosing.  Plan: Cefepime 2gm IV q12hrs (renally adjusted)  Vancomycin variable dosing based on level and renal fxn  Height: 5\' 4"  (162.6 cm) Weight: 112.9 kg (248 lb 14.4 oz) IBW/kg (Calculated) : 54.7  Temp (24hrs), Avg:99.4 F (37.4 C), Min:99.4 F (37.4 C), Max:99.4 F (37.4 C)  Recent Labs  Lab 01/23/2020 1916  WBC 10.6*  CREATININE 2.47*  LATICACIDVEN 5.6*    Estimated Creatinine Clearance: 26.5 mL/min (A) (by C-G formula based on SCr of 2.47 mg/dL (H)).    No Known Allergies  Antimicrobials this admission:   >>    >>   Dose adjustments this admission:   Microbiology results:  BCx:   UCx:    Sputum:    MRSA PCR:   Thank you for allowing pharmacy to be a part of this patient's care.  03/18/20 A 01/21/2020 10:30 PM

## 2020-01-17 NOTE — Progress Notes (Signed)
CODE SEPSIS - PHARMACY COMMUNICATION  **Broad Spectrum Antibiotics should be administered within 1 hour of Sepsis diagnosis**  Time Code Sepsis Called/Page Received: 4/12 @ 2032  Antibiotics Ordered: cefepime, vancomycin  Time of 1st antibiotic administration: 4/12 @ 2042  Additional action taken by pharmacy: None at this time  If necessary, Name of Provider/Nurse Contacted: N/A    Cherly Hensen ,PharmD 02/03/2020  8:48 PM

## 2020-01-17 NOTE — ED Notes (Addendum)
Date and time results received: Feb 02, 2020 1959  Test: Lactic acid  Critical Value: 5.6  Name of Provider Notified: Dr Fanny Bien

## 2020-01-18 ENCOUNTER — Other Ambulatory Visit: Payer: Self-pay

## 2020-01-18 ENCOUNTER — Inpatient Hospital Stay: Payer: Medicare HMO

## 2020-01-18 DIAGNOSIS — A419 Sepsis, unspecified organism: Secondary | ICD-10-CM

## 2020-01-18 DIAGNOSIS — R6521 Severe sepsis with septic shock: Secondary | ICD-10-CM

## 2020-01-18 LAB — CBC WITH DIFFERENTIAL/PLATELET
Abs Immature Granulocytes: 1.6 10*3/uL — ABNORMAL HIGH (ref 0.00–0.07)
Basophils Absolute: 0.1 10*3/uL (ref 0.0–0.1)
Basophils Relative: 1 %
Eosinophils Absolute: 0 10*3/uL (ref 0.0–0.5)
Eosinophils Relative: 0 %
HCT: 31.4 % — ABNORMAL LOW (ref 36.0–46.0)
Hemoglobin: 8.9 g/dL — ABNORMAL LOW (ref 12.0–15.0)
Immature Granulocytes: 7 %
Lymphocytes Relative: 3 %
Lymphs Abs: 0.7 10*3/uL (ref 0.7–4.0)
MCH: 28.7 pg (ref 26.0–34.0)
MCHC: 28.3 g/dL — ABNORMAL LOW (ref 30.0–36.0)
MCV: 101.3 fL — ABNORMAL HIGH (ref 80.0–100.0)
Monocytes Absolute: 0.9 10*3/uL (ref 0.1–1.0)
Monocytes Relative: 4 %
Neutro Abs: 21.4 10*3/uL — ABNORMAL HIGH (ref 1.7–7.7)
Neutrophils Relative %: 85 %
Platelets: 184 10*3/uL (ref 150–400)
RBC: 3.1 MIL/uL — ABNORMAL LOW (ref 3.87–5.11)
RDW: 19.6 % — ABNORMAL HIGH (ref 11.5–15.5)
WBC: 24.8 10*3/uL — ABNORMAL HIGH (ref 4.0–10.5)
nRBC: 0 % (ref 0.0–0.2)

## 2020-01-18 LAB — COMPREHENSIVE METABOLIC PANEL
ALT: 14 U/L (ref 0–44)
AST: 29 U/L (ref 15–41)
Albumin: 2.7 g/dL — ABNORMAL LOW (ref 3.5–5.0)
Alkaline Phosphatase: 49 U/L (ref 38–126)
Anion gap: 11 (ref 5–15)
BUN: 35 mg/dL — ABNORMAL HIGH (ref 8–23)
CO2: 26 mmol/L (ref 22–32)
Calcium: 8.5 mg/dL — ABNORMAL LOW (ref 8.9–10.3)
Chloride: 106 mmol/L (ref 98–111)
Creatinine, Ser: 2.84 mg/dL — ABNORMAL HIGH (ref 0.44–1.00)
GFR calc Af Amer: 19 mL/min — ABNORMAL LOW (ref >60)
GFR calc non Af Amer: 16 mL/min — ABNORMAL LOW (ref >60)
Glucose, Bld: 120 mg/dL — ABNORMAL HIGH (ref 70–99)
Potassium: 4.4 mmol/L (ref 3.5–5.1)
Sodium: 143 mmol/L (ref 135–145)
Total Bilirubin: 1.2 mg/dL (ref 0.3–1.2)
Total Protein: 5.7 g/dL — ABNORMAL LOW (ref 6.5–8.1)

## 2020-01-18 LAB — EXPECTORATED SPUTUM ASSESSMENT W GRAM STAIN, RFLX TO RESP C

## 2020-01-18 LAB — GLUCOSE, CAPILLARY
Glucose-Capillary: 111 mg/dL — ABNORMAL HIGH (ref 70–99)
Glucose-Capillary: 118 mg/dL — ABNORMAL HIGH (ref 70–99)
Glucose-Capillary: 160 mg/dL — ABNORMAL HIGH (ref 70–99)
Glucose-Capillary: 200 mg/dL — ABNORMAL HIGH (ref 70–99)
Glucose-Capillary: 215 mg/dL — ABNORMAL HIGH (ref 70–99)
Glucose-Capillary: 225 mg/dL — ABNORMAL HIGH (ref 70–99)

## 2020-01-18 LAB — LACTIC ACID, PLASMA: Lactic Acid, Venous: 3.8 mmol/L (ref 0.5–1.9)

## 2020-01-18 LAB — RESPIRATORY PANEL BY RT PCR (FLU A&B, COVID)
Influenza A by PCR: NEGATIVE
Influenza B by PCR: NEGATIVE
SARS Coronavirus 2 by RT PCR: NEGATIVE

## 2020-01-18 LAB — MAGNESIUM: Magnesium: 1.3 mg/dL — ABNORMAL LOW (ref 1.7–2.4)

## 2020-01-18 LAB — PROTIME-INR
INR: 1.7 — ABNORMAL HIGH (ref 0.8–1.2)
Prothrombin Time: 19.6 seconds — ABNORMAL HIGH (ref 11.4–15.2)

## 2020-01-18 LAB — PHOSPHORUS: Phosphorus: 5 mg/dL — ABNORMAL HIGH (ref 2.5–4.6)

## 2020-01-18 LAB — SARS CORONAVIRUS 2 (TAT 6-24 HRS): SARS Coronavirus 2: NEGATIVE

## 2020-01-18 LAB — MRSA PCR SCREENING: MRSA by PCR: NEGATIVE

## 2020-01-18 LAB — BLOOD GAS, VENOUS
Acid-base deficit: 13.4 mmol/L — ABNORMAL HIGH (ref 0.0–2.0)
Bicarbonate: 13.1 mmol/L — ABNORMAL LOW (ref 20.0–28.0)
O2 Saturation: 55.3 %
Patient temperature: 37
pCO2, Ven: 32 mmHg — ABNORMAL LOW (ref 44.0–60.0)
pH, Ven: 7.22 — ABNORMAL LOW (ref 7.250–7.430)
pO2, Ven: 36 mmHg (ref 32.0–45.0)

## 2020-01-18 MED ORDER — HYDROCORTISONE NA SUCCINATE PF 100 MG IJ SOLR
50.0000 mg | Freq: Four times a day (QID) | INTRAMUSCULAR | Status: DC
  Administered 2020-01-18 – 2020-01-20 (×8): 50 mg via INTRAVENOUS
  Filled 2020-01-18 (×2): qty 2
  Filled 2020-01-18 (×2): qty 1
  Filled 2020-01-18: qty 2
  Filled 2020-01-18: qty 1
  Filled 2020-01-18 (×3): qty 2
  Filled 2020-01-18: qty 1

## 2020-01-18 MED ORDER — SODIUM CHLORIDE 0.9 % IV SOLN: 4.0000 g | Freq: Once | INTRAVENOUS | Status: DC

## 2020-01-18 MED ORDER — CHLORHEXIDINE GLUCONATE 0.12 % MT SOLN: 15.0000 mL | Freq: Once | OROMUCOSAL | Status: AC

## 2020-01-18 MED ORDER — PHENYLEPHRINE CONCENTRATED 100MG/250ML (0.4 MG/ML) INFUSION SIMPLE
0.0000 ug/min | INTRAVENOUS | Status: DC
  Filled 2020-01-18: qty 250

## 2020-01-18 MED ORDER — CHLORHEXIDINE GLUCONATE CLOTH 2 % EX PADS
6.0000 | MEDICATED_PAD | Freq: Every day | CUTANEOUS | Status: DC
  Administered 2020-01-18 – 2020-01-20 (×3): 6 via TOPICAL

## 2020-01-18 MED ORDER — SODIUM BICARBONATE 8.4 % IV SOLN
INTRAVENOUS | Status: AC
  Filled 2020-01-18: qty 50

## 2020-01-18 MED ORDER — CHLORHEXIDINE GLUCONATE 0.12 % MT SOLN
OROMUCOSAL | Status: AC
  Administered 2020-01-18: 15 mL via OROMUCOSAL
  Filled 2020-01-18: qty 15

## 2020-01-18 MED ORDER — HYDROCOD POLST-CPM POLST ER 10-8 MG/5ML PO SUER
5.0000 mL | Freq: Two times a day (BID) | ORAL | Status: DC | PRN
  Administered 2020-01-18 – 2020-01-21 (×2): 5 mL via ORAL
  Filled 2020-01-18 (×2): qty 5

## 2020-01-18 MED ORDER — IPRATROPIUM-ALBUTEROL 0.5-2.5 (3) MG/3ML IN SOLN
3.0000 mL | Freq: Four times a day (QID) | RESPIRATORY_TRACT | Status: DC
  Administered 2020-01-18 – 2020-01-22 (×14): 3 mL via RESPIRATORY_TRACT
  Filled 2020-01-18 (×15): qty 3

## 2020-01-18 MED ORDER — ENSURE MAX PROTEIN PO LIQD
11.0000 [oz_av] | Freq: Two times a day (BID) | ORAL | Status: DC
  Administered 2020-01-19 – 2020-01-20 (×3): 11 [oz_av] via ORAL
  Administered 2020-01-21: 12:00:00 247 mL via ORAL
  Filled 2020-01-18: qty 330

## 2020-01-18 MED ORDER — SODIUM CHLORIDE 0.9 % IV SOLN: INTRAVENOUS | Status: DC

## 2020-01-18 MED ORDER — SODIUM CHLORIDE 0.9 % IV SOLN: 250.0000 mL | INTRAVENOUS | Status: DC

## 2020-01-18 MED ORDER — SODIUM BICARBONATE 8.4 % IV SOLN
100.0000 meq | Freq: Once | INTRAVENOUS | Status: AC
  Administered 2020-01-18: 100 meq via INTRAVENOUS
  Filled 2020-01-18: qty 50

## 2020-01-18 MED ORDER — VASOPRESSIN 20 UNIT/ML IV SOLN
0.0300 [IU]/min | INTRAVENOUS | Status: DC
  Administered 2020-01-18 (×2): 0.03 [IU]/min via INTRAVENOUS
  Filled 2020-01-18: qty 2

## 2020-01-18 MED ORDER — NOREPINEPHRINE 16 MG/250ML-% IV SOLN
0.0000 ug/min | INTRAVENOUS | Status: DC
  Administered 2020-01-18: 35 ug/min via INTRAVENOUS
  Administered 2020-01-18: 25 ug/min via INTRAVENOUS
  Administered 2020-01-18 – 2020-01-19 (×2): 10 ug/min via INTRAVENOUS
  Filled 2020-01-18 (×3): qty 250

## 2020-01-18 MED ORDER — ADULT MULTIVITAMIN W/MINERALS CH
1.0000 | ORAL_TABLET | Freq: Every day | ORAL | Status: DC
  Administered 2020-01-19 – 2020-01-20 (×2): 1 via ORAL
  Filled 2020-01-18 (×3): qty 1

## 2020-01-18 MED ORDER — NOREPINEPHRINE 4 MG/250ML-% IV SOLN
2.0000 ug/min | INTRAVENOUS | Status: DC
  Administered 2020-01-18: 2 ug/min via INTRAVENOUS
  Filled 2020-01-18: qty 250

## 2020-01-18 MED ORDER — SODIUM CHLORIDE 0.9 % IV SOLN
2.0000 g | INTRAVENOUS | Status: DC
  Administered 2020-01-19: 2 g via INTRAVENOUS
  Filled 2020-01-18: qty 2

## 2020-01-18 MED ORDER — MAGNESIUM SULFATE 4 GM/100ML IV SOLN
4.0000 g | Freq: Once | INTRAVENOUS | Status: AC
  Administered 2020-01-18: 4 g via INTRAVENOUS
  Filled 2020-01-18: qty 100

## 2020-01-18 NOTE — Progress Notes (Signed)
Name: Jasmine Osborn MRN: [id number] DOB: 18-Sep-1951    PROGRESS NOTE:  ADMISSION DATE:  10-Feb-2020  Presenting Hospital problem: Hypotension   BRIEF PATIENT DESCRIPTION: 69 yo female admitted with septic shock of urinary tract infection source.  SIGNIFICANT EVENTS/STUDIES:  04/12: Pt admitted to ICU with septic shock secondary to UTI  04/13: Hematuria, Apixiban d/cd. UTI +/- PNA as well    Prior to Admission medications   Medication Sig Start Date End Date Taking? Authorizing Provider  apixaban (ELIQUIS) 5 MG TABS tablet Take 5 mg by mouth 2 (two) times daily.   Yes [provider]  cephALEXin (KEFLEX) 500 MG capsule Take 1 capsule (500 mg total) by mouth every 8 (eight) hours. 01/08/20  Yes Lynn Ito, MD  collagenase (SANTYL) ointment Apply topically daily. 01/09/20  Yes Lynn Ito, MD  diltiazem (CARDIZEM CD) 120 MG 24 hr capsule Take 1 capsule (120 mg total) by mouth daily. 01/08/20  Yes Lynn Ito, MD  fenofibrate 160 MG tablet Take 160 mg by mouth daily.   Yes [provider]  hydrocerin (EUCERIN) CREA Apply 1 application topically daily. 01/11/20  Yes Amery, Freddi Che, MD  KLOR-CON 10 10 MEQ tablet Take 20 mEq by mouth daily.  10/22/16  Yes [provider]  latanoprost (XALATAN) 0.005 % ophthalmic solution USE 1 DROP IN [facility] AT BEDTIME 05/18/15  Yes [provider]  magnesium oxide (MAG-OX) 400 (241.3 Mg) MG tablet Take 1 tablet (400 mg total) by mouth 2 (two) times daily. 01/10/20  Yes Lynn Ito, MD  metFORMIN (GLUCOPHAGE) 500 MG tablet Take 500 mg by mouth 2 (two) times daily with a meal.   Yes [provider]  metoprolol succinate (TOPROL-XL) 50 MG 24 hr tablet Take 1 tablet (50 mg total) by mouth 2 (two) times daily. 01/10/20  Yes Lynn Ito, MD  mirtazapine (REMERON) 30 MG tablet Take 30 mg by mouth at bedtime.   Yes [provider]  multivitamin-lutein (OCUVITE-LUTEIN) CAPS capsule Take 1 capsule by mouth daily.  11/09/17  Yes Wieting, Richard, MD  nystatin (MYCOSTATIN/NYSTOP) powder Apply 1 application topically 3 (three) times daily.   Yes [provider]  risperiDONE (RISPERDAL) 1 MG tablet Take 1 tablet (1 mg total) by mouth at bedtime. 01/03/17  Yes Enid Baas, MD  torsemide (DEMADEX) 20 MG tablet Take 20 mg by mouth daily.   Yes [provider]  tranexamic acid (LYSTEDA) 650 MG TABS tablet Take 2 tablets (1,300 mg total) by mouth 3 (three) times daily. 01/08/20  Yes Lynn Ito, MD  vitamin B-12 (CYANOCOBALAMIN) 1000 MCG tablet Take 1 tablet (1,000 mcg total) by mouth daily. 01/08/20  Yes Lynn Ito, MD  vitamin C (ASCORBIC ACID) 250 MG tablet Take 500 mg by mouth 2 (two) times daily.   Yes [provider]  zinc sulfate 220 (50 Zn) MG capsule Take 220 mg by mouth daily.   Yes [provider]  metoprolol succinate (TOPROL-XL) 50 MG 24 hr tablet Take 50 mg by mouth daily. 09/04/17   [provider]   No Known Allergies     REVIEW OF SYSTEMS: Positives in BOLD  Constitutional: fever, chills, weight loss, malaise/fatigue and diaphoresis.  HENT: Negative for hearing loss, ear pain, nosebleeds, congestion, sore throat, neck pain, tinnitus and ear discharge.   Eyes: Negative for blurred vision, double vision, photophobia, pain, discharge and redness.  Respiratory: Negative for cough, hemoptysis, sputum production, shortness of breath, wheezing and stridor.   Cardiovascular: Negative for chest  pain, palpitations, orthopnea, claudication, leg swelling and PND.  Gastrointestinal: Negative for heartburn, nausea, vomiting, abdominal pain, diarrhea, constipation, blood in stool and melena.  Genitourinary: Negative for dysuria, urgency, frequency, hematuria and flank pain.  Musculoskeletal: Negative for myalgias, back pain, joint pain and falls.  Skin: Negative for itching and rash.  Neurological: Negative for dizziness, tingling, tremors, sensory change,  speech change, focal weakness, seizures, loss of consciousness, weakness and headaches.  Endo/Heme/Allergies: Negative for environmental allergies and polydipsia. Does not bruise/bleed easily.  SUBJECTIVE:  Currently on Bipap no complaints at this time. Able to be off of BiPAP for short periods of time.  VITAL SIGNS: Temp:  [98.1 F (36.7 C)-99.5 F (37.5 C)] 98.1 F (36.7 C) (04/13 1940) Pulse Rate:  [79-124] 94 (04/13 2000) Resp:  [18-47] 36 (04/13 2000) BP: (66-166)/(31-137) 126/54 (04/13 2000) SpO2:  [86 %-100 %] 99 % (04/13 2000) FiO2 (%):  [30 %-35 %] 30 % (04/13 1941) Weight:  [546 kg-116.1 kg] 116.1 kg (04/13 0400)  PHYSICAL EXAMINATION: General: chronically ill appearing obese female, NAD on Bipap  Neuro: Sleepy but arousable, follows commands, PERRL HEENT: unable to assess JVD pt with large neck  Cardiovascular: nsr, rrr, no R/G Lungs: diminished throughout, non labored, rhonchi at bases Abdomen: +BS x4, obese, soft, non tender, non distended  GU: Pure wick in place, hematuria noted Musculoskeletal: 2+ bilateral lower extremity edema  Skin: intact no rashes or lesions present. Stasis changes lower extremities.  Recent Labs  Lab 2020-02-15 1916 01/18/20 0341  NA 141 143  K 3.7 4.4  CL 105 106  CO2 23 26  BUN 32* 35*  CREATININE 2.47* 2.84*  GLUCOSE 152* 120*   Recent Labs  Lab 2020-02-15 1916 01/18/20 1924  HGB 9.9* 8.9*  HCT 34.7* 31.4*  WBC 10.6* 24.8*  PLT 172 184   DG Chest Port 1 View  Result Date: 01/18/2020 CLINICAL DATA:  69 year old female with history of acute respiratory failure. History of atrial fibrillation. EXAM: PORTABLE CHEST 1 VIEW COMPARISON:  Chest x-ray 2020-02-15. FINDINGS: Lung volumes are low. Bibasilar opacities (left greater than right) may reflect areas of atelectasis and/or consolidation. Small left pleural effusion. No right pleural effusion. Cephalization of the pulmonary vasculature with indistinct interstitial markings. Mild  cardiomegaly. Upper mediastinal contours are distorted by patient's rotation to the left. IMPRESSION: 1. The appearance the chest again suggests mild congestive heart failure. 2. Additional bibasilar opacities may reflect areas of atelectasis and/or consolidation. Small left pleural effusion. Electronically Signed   By: Vinnie Langton M.D.   On: 01/18/2020 07:23   DG Chest Portable 1 View  Result Date: 02-15-2020 CLINICAL DATA:  Tachypnea EXAM: PORTABLE CHEST 1 VIEW COMPARISON:  01/06/2020, CT chest 11/02/2017 FINDINGS: Cardiomegaly with vascular congestion and diffuse interstitial and hazy opacities suspicious for edema. More confluent airspace disease at the left base. No pneumothorax. IMPRESSION: 1. Cardiomegaly with vascular congestion and probable pulmonary edema. 2. Possible small pleural effusions. Hazy atelectasis or pneumonia at the bases. Overall the findings do not appear greatly changed from 01/06/2020. Electronically Signed   By: Donavan Foil M.D.   On: Feb 15, 2020 19:37    ASSESSMENT / PLAN:  Acute on chronic respiratory failure secondary to pulmonary edema and pickwickian syndrome   Hx: Morbid Obesity Supplemental O2 for dyspnea and/or hypoxia  CPAP or Bipap qhs or when sleepy PRN bronchodilator therapy    Septic shock  Urinary tract infection Hx: PVD, HTN, Hyperlipidemia, and Chronic Atrial Fibrillation Continuous telemetry monitoring  Hold outpatient antihypertensives  and cardizem for now  PRN metoprolol for sustained hr >120 bpm Discontinue Eliquis due to hematuria Required central access Gentle fluid rehydration and prn neo-synephrine to maintain map >65  Acute on chronic renal failure  Lactic acidosis  Trend BMP and lactic acid  Replace electrolytes as indicated Monitor UOP Avoid nephrotoxic medications Discontinue Eliquis due to worsening renal function and hematuria  Leukocytosis secondary to UTI  Possible pneumonia as well Trend WBC and monitor fever  curve Trend PCT  Follow cultures  Continue vancomycin and cefepime for now; if MRSA PCR negative will discontinue vancomycin   Anemia without obvious acute blood loss Trend CBC Monitor for s/sx of bleeding  Transfuse for hgb <7 and/or signs of active bleeding   Type II Diabetes Mellitus  CBG q4hrs  SSI   Depression  Psychosis  Continue outpatient risperdal   Best Practice: VTE px: Discontinued Eliquis due to hematuria, mechanical DVT prophylaxis SUP px: not indicated  Diet: heart healthy once off of Bipap     Multidisciplinary rounds were performed with ICU/SDU staff. Patient's husband was apprised at bedside.    Gailen Shelter, MD [place] [facility]   *This note was dictated using voice recognition software/Dragon.  Despite best efforts to proofread, errors can occur which can change the meaning.  Any change was purely unintentional.

## 2020-01-18 NOTE — Progress Notes (Signed)
Pharmacy Antibiotic Note  Jasmine Osborn is a 69 y.o. female admitted on 01/20/2020 with sepsis.  Pharmacy has been consulted for vancomycin and cefepime dosing.  Plan: Cefepime 2gm IV q24hrs (renally adjusted)  Vancomycin discontinued in CCU rounds  Height: 5\' 2"  (157.5 cm) Weight: 116.1 kg (255 lb 15.3 oz) IBW/kg (Calculated) : 50.1  Temp (24hrs), Avg:98.8 F (37.1 C), Min:98.1 F (36.7 C), Max:99.5 F (37.5 C)  Recent Labs  Lab 01/16/2020 1916 01/31/2020 2143 01/18/20 0341  WBC 10.6*  --   --   CREATININE 2.47*  --  2.84*  LATICACIDVEN 5.6* 5.0* 3.8*    Estimated Creatinine Clearance: 22.6 mL/min (A) (by C-G formula based on SCr of 2.84 mg/dL (H)).    No Known Allergies  Antimicrobials this admission:  Vancomycin 4/12 >> 4/13    Cefepime 4/12 >>   Dose adjustments this admission: 4/13 Cefepime decreased to q24h  Microbiology results:  BCx:   UCx:    Sputum:    MRSA PCR:   Thank you for allowing pharmacy to be a part of this patient's care.  5/13, PharmD, BCPS 01/18/2020 4:56 PM

## 2020-01-18 NOTE — Progress Notes (Signed)
Initial Nutrition Assessment  DOCUMENTATION CODES:   Morbid obesity  INTERVENTION:  Once patient able to come off BiPAP and can tolerate oral intake provide Ensure Max Protein po BID, each supplement provides 150 kcal and 30 grams of protein.  Provide daily MVI.  NUTRITION DIAGNOSIS:   Increased nutrient needs related to wound healing as evidenced by estimated needs.  GOAL:   Patient will meet greater than or equal to 90% of their needs  MONITOR:   PO intake, Supplement acceptance, Labs, Weight trends, Skin, I & O's  REASON FOR ASSESSMENT:   Malnutrition Screening Tool    ASSESSMENT:   69 year old female with PMHx of DM, HTN, depression, PVD, A-fib, OHS admitted with septic shock secondary to UTI, acute on chronic respiratory failure secondary to pulmonary edema and pickwickian syndrome, AKI.   Patient was sleeping on BiPAP this morning so unable to provide any history. Will monitor for PO intake once patient able to come off BiPAP. Patient will benefit from oral nutrition supplements.  Weights appear to fluctuate in chart. Patient is currently 116.1 kg (255.95 lbs).  Medications reviewed and include: Eliquis, Solu-Cortef 50 mg Q6hrs IV, Novolog 0-9 units Q4hrs, vitamin B12 1000 micrograms daily, NS at 50 mL/hr, cefepime, norepinephrine gtt at 35 mcg/min, vasopressin gtt at 0.03 units/min.  Labs reviewed: CBG 111-160, BUN 35, Creatinine 2.84, Phosphorus 5, Magnesium 1.3.  Patient does not meet criteria for malnutrition at this time.  Discussed on rounds.  NUTRITION - FOCUSED PHYSICAL EXAM:    Most Recent Value  Orbital Region  No depletion  Upper Arm Region  No depletion  Thoracic and Lumbar Region  No depletion  Buccal Region  Unable to assess  Temple Region  No depletion  Clavicle Bone Region  No depletion  Clavicle and Acromion Bone Region  No depletion  Scapular Bone Region  Unable to assess  Dorsal Hand  No depletion  Patellar Region  Unable to assess   Anterior Thigh Region  Unable to assess  Posterior Calf Region  Unable to assess  Edema (RD Assessment)  -- [non pitting]  Hair  Reviewed  Eyes  Unable to assess  Mouth  Unable to assess  Skin  Reviewed  Nails  Reviewed     Diet Order:   Diet Order            Diet Heart Room service appropriate? Yes; Fluid consistency: Thin  Diet effective now             EDUCATION NEEDS:   No education needs have been identified at this time  Skin:  Skin Assessment: Skin Integrity Issues:(stg II coccyx; MSAD to breast, coccyx, and groin)  Last BM:  01/18/2020 medium type 6  Height:   Ht Readings from Last 1 Encounters:  01/08/2020 5\' 2"  (1.575 m)   Weight:   Wt Readings from Last 1 Encounters:  01/18/20 116.1 kg   Ideal Body Weight:  50 kg  BMI:  Body mass index is 46.81 kg/m.  Estimated Nutritional Needs:   Kcal:  2000-2200  Protein:  115-125 grams  Fluid:  1.5-1.8 L/day  01/20/20, MS, RD, LDN Pager number available on Amion

## 2020-01-18 NOTE — TOC Initial Note (Addendum)
Transition of Care [facility]) - Initial/Assessment Note    Patient Details  Name: RENUKA FARFAN MRN: [id number] Date of Birth: 1950/12/19  Transition of [facility]) CM/SW Contact:    Magnus Ivan, LCSW Phone Number: 01/18/2020, 4:07 PM  Clinical Narrative:      CSW spoke with patient's husband, Senaida Ores, for Readmission Screening. Senaida Ores confirmed patient came to the hospital from [place] where she was receiving short term rehab. He reported he would like patient to return to SNF for rehab at discharge if this is recommended. He reported he spoke to a [facility] Representative today who was asking for him to pay $280 per day to hold patient's bed at [facility]. He reported he is not sure he feels comfortable paying this money. Discussed with him that PT and OT will need to see patient during this hospitalization and he can choose to hold the bed at [facility], or we can start a new SNF work up if SNF is still recommended and [place] and patient can decide based on bed offers received at that time. He reported he would like to give up the bed at [facility] and see what bed offers are available when patient is ready for discharge. CSW will continue to follow. Asked MD to submit PT/OT consults when appropriate.               Expected Discharge Plan: Skilled Nursing Facility Barriers to Discharge: Continued Medical Work up   Patient Goals and CMS Choice        Expected Discharge Plan and Services Expected Discharge Plan: [place] arrangements for the past 2 months: [place], [place]                                      Prior Living Arrangements/Services Living arrangements for the past 2 months: [place], [place] Lives with:: Facility Resident(Lived with spouse prior to going to SNF for rehab.) Patient language and need for interpreter reviewed:: Yes        Need for Family Participation in Patient Care: Yes  (Comment) Care giver support system in place?: Yes (comment)   Criminal Activity/Legal Involvement Pertinent to Current Situation/Hospitalization: No - Comment as needed  Activities of Daily Living Home Assistive Devices/Equipment: Cane (specify quad or straight) ADL Screening (condition at time of admission) Patient's cognitive ability adequate to safely complete daily activities?: No Is the patient deaf or have difficulty hearing?: No Does the patient have difficulty seeing, even when wearing glasses/contacts?: No Does the patient have difficulty concentrating, remembering, or making decisions?: No Patient able to express need for assistance with ADLs?: Yes Does the patient have difficulty dressing or bathing?: No Independently performs ADLs?: Yes (appropriate for developmental age) Does the patient have difficulty walking or climbing stairs?: Yes Weakness of Legs: Both Weakness of Arms/Hands: None  Permission Sought/Granted Permission sought to share information with : Facility Art therapist granted to share information with : Yes, Verbal Permission Granted(Permission given by spouse.)     Permission granted to share info w AGENCY: SNFs        Emotional Assessment         Alcohol / Substance Use: Not Applicable Psych Involvement: No (comment)  Admission diagnosis:  Shock ([place]) [R57.9] Septic shock ([place]) [A41.9, R65.21] Patient Active Problem List   Diagnosis Date Noted  .  Septic shock (HCC) 01-24-2020  . Cellulitis of left lower extremity   . Skin ulcer of toe of left foot, limited to breakdown of skin (HCC)   . AF (paroxysmal atrial fibrillation) (HCC)   . Symptomatic anemia 01/03/2020  . Vaginal bleeding 01/03/2020  . ARF (acute renal failure) (HCC) 01/03/2020  . Anemia 12/16/2017  . Persistent atrial fibrillation (HCC) 11/05/2017  . Morbid obesity (HCC) 11/05/2017  . Acute on chronic respiratory failure with hypoxia and hypercapnia (HCC)  11/02/2017  . Increased endometrial stripe thickness 06/04/2017  . Pressure injury of skin 12/23/2016  . Respiratory failure with hypercapnia (HCC) 12/22/2016   PCP:  Lauro Regulus, MD Pharmacy:   [facility] [phone] [place], [place] - [address] [place] [address] Phone: [phone] Fax: [phone]     Social Determinants of Health (SDOH) Interventions    Readmission Risk Interventions Readmission Risk Prevention Plan 01/18/2020  Transportation Screening Complete  Medication Review (RN Care Manager) Complete  PCP or Specialist appointment within 3-5 days of discharge Complete  HRI or Home Care Consult Complete  Skilled Nursing Facility Complete  Some recent data might be hidden

## 2020-01-18 NOTE — Progress Notes (Signed)
Patient remains AOx4.  Her vital signs remain nominal B/P 116/44 (64) prior to 150/66 (86) and 166/137 (148).  She is now receiving 57mcg/min of Levo and the Vaso therapy was stopped around 1330. Breathing has improved without the BiPAP on 3L [place] saturating low to mid 90's.  Her breath sounds remain diminished upon auscultation.  Her peripheral pulses are palpable but her BLE pulses are encumbered by her 2+ pitting edema.  She has undocumented prior wound ulcers to her left heel and her left anterior great toe.  I placed a mepilex on both sites after cleansing.  Her UOP has been good as she has had numerous episodes but the purewick did not suction the urine d/t complications with suction.  She has been cleansed with her bed changed twice today; one bath in the morning and a peri cleanse this afternoon.  Her entire perineum is red with MAD.  She has significant skin breakdown tx with moisture barrier cream.  The purewick now suctions bright red colored urine.  She has a history of vaginal bleeding prior to this visit.  Will inform the physician.  Patient complains of no pain.

## 2020-01-18 NOTE — H&P (Deleted)
 Name: Jasmine Osborn MRN: [id number] DOB: 07/12/1951    PROGRESS NOTE:  ADMISSION DATE:  01/27/2020  Presenting Hospital problem: Hypotension   BRIEF PATIENT DESCRIPTION: 69 yo female admitted with septic shock of urinary tract infection source.  SIGNIFICANT EVENTS/STUDIES:  04/12: Pt admitted to ICU with septic shock secondary to UTI  04/13: Hematuria, Apixiban d/cd. UTI +/- PNA as well    Prior to Admission medications   Medication Sig Start Date End Date Taking? Authorizing Provider  apixaban (ELIQUIS) 5 MG TABS tablet Take 5 mg by mouth 2 (two) times daily.   Yes [provider]  cephALEXin (KEFLEX) 500 MG capsule Take 1 capsule (500 mg total) by mouth every 8 (eight) hours. 01/08/20  Yes Amery, Sahar, MD  collagenase (SANTYL) ointment Apply topically daily. 01/09/20  Yes Amery, Sahar, MD  diltiazem (CARDIZEM CD) 120 MG 24 hr capsule Take 1 capsule (120 mg total) by mouth daily. 01/08/20  Yes Amery, Sahar, MD  fenofibrate 160 MG tablet Take 160 mg by mouth daily.   Yes [provider]  hydrocerin (EUCERIN) CREA Apply 1 application topically daily. 01/11/20  Yes Amery, Sahar, MD  KLOR-CON 10 10 MEQ tablet Take 20 mEq by mouth daily.  10/22/16  Yes [provider]  latanoprost (XALATAN) 0.005 % ophthalmic solution USE 1 DROP IN EACH EYE AT BEDTIME 05/18/15  Yes [provider]  magnesium oxide (MAG-OX) 400 (241.3 Mg) MG tablet Take 1 tablet (400 mg total) by mouth 2 (two) times daily. 01/10/20  Yes Amery, Sahar, MD  metFORMIN (GLUCOPHAGE) 500 MG tablet Take 500 mg by mouth 2 (two) times daily with a meal.   Yes [provider]  metoprolol succinate (TOPROL-XL) 50 MG 24 hr tablet Take 1 tablet (50 mg total) by mouth 2 (two) times daily. 01/10/20  Yes Amery, Sahar, MD  mirtazapine (REMERON) 30 MG tablet Take 30 mg by mouth at bedtime.   Yes [provider]  multivitamin-lutein (OCUVITE-LUTEIN) CAPS capsule Take 1 capsule by mouth daily.  11/09/17  Yes Wieting, Richard, MD  nystatin (MYCOSTATIN/NYSTOP) powder Apply 1 application topically 3 (three) times daily.   Yes [provider]  risperiDONE (RISPERDAL) 1 MG tablet Take 1 tablet (1 mg total) by mouth at bedtime. 01/03/17  Yes Kalisetti, Radhika, MD  torsemide (DEMADEX) 20 MG tablet Take 20 mg by mouth daily.   Yes [provider]  tranexamic acid (LYSTEDA) 650 MG TABS tablet Take 2 tablets (1,300 mg total) by mouth 3 (three) times daily. 01/08/20  Yes Amery, Sahar, MD  vitamin B-12 (CYANOCOBALAMIN) 1000 MCG tablet Take 1 tablet (1,000 mcg total) by mouth daily. 01/08/20  Yes Amery, Sahar, MD  vitamin C (ASCORBIC ACID) 250 MG tablet Take 500 mg by mouth 2 (two) times daily.   Yes [provider]  zinc sulfate 220 (50 Zn) MG capsule Take 220 mg by mouth daily.   Yes [provider]  metoprolol succinate (TOPROL-XL) 50 MG 24 hr tablet Take 50 mg by mouth daily. 09/04/17   [provider]   No Known Allergies     REVIEW OF SYSTEMS: Positives in BOLD  Constitutional: fever, chills, weight loss, malaise/fatigue and diaphoresis.  HENT: Negative for hearing loss, ear pain, nosebleeds, congestion, sore throat, neck pain, tinnitus and ear discharge.   Eyes: Negative for blurred vision, double vision, photophobia, pain, discharge and redness.  Respiratory: Negative for cough, hemoptysis, sputum production, shortness of breath, wheezing and stridor.   Cardiovascular: Negative for chest   pain, palpitations, orthopnea, claudication, leg swelling and PND.  Gastrointestinal: Negative for heartburn, nausea, vomiting, abdominal pain, diarrhea, constipation, blood in stool and melena.  Genitourinary: Negative for dysuria, urgency, frequency, hematuria and flank pain.  Musculoskeletal: Negative for myalgias, back pain, joint pain and falls.  Skin: Negative for itching and rash.  Neurological: Negative for dizziness, tingling, tremors, sensory change,  speech change, focal weakness, seizures, loss of consciousness, weakness and headaches.  Endo/Heme/Allergies: Negative for environmental allergies and polydipsia. Does not bruise/bleed easily.  SUBJECTIVE:  Currently on Bipap no complaints at this time. Able to be off of BiPAP for short periods of time.  VITAL SIGNS: Temp:  [98.1 F (36.7 C)-99.5 F (37.5 C)] 98.1 F (36.7 C) (04/13 1940) Pulse Rate:  [79-124] 94 (04/13 2000) Resp:  [18-47] 36 (04/13 2000) BP: (66-166)/(31-137) 126/54 (04/13 2000) SpO2:  [86 %-100 %] 99 % (04/13 2000) FiO2 (%):  [30 %-35 %] 30 % (04/13 1941) Weight:  [546 kg-116.1 kg] 116.1 kg (04/13 0400)  PHYSICAL EXAMINATION: General: chronically ill appearing obese female, NAD on Bipap  Neuro: Sleepy but arousable, follows commands, PERRL HEENT: unable to assess JVD pt with large neck  Cardiovascular: nsr, rrr, no R/G Lungs: diminished throughout, non labored, rhonchi at bases Abdomen: +BS x4, obese, soft, non tender, non distended  GU: Pure wick in place, hematuria noted Musculoskeletal: 2+ bilateral lower extremity edema  Skin: intact no rashes or lesions present. Stasis changes lower extremities.  Recent Labs  Lab 2020-02-15 1916 01/18/20 0341  NA 141 143  K 3.7 4.4  CL 105 106  CO2 23 26  BUN 32* 35*  CREATININE 2.47* 2.84*  GLUCOSE 152* 120*   Recent Labs  Lab 2020-02-15 1916 01/18/20 1924  HGB 9.9* 8.9*  HCT 34.7* 31.4*  WBC 10.6* 24.8*  PLT 172 184   DG Chest Port 1 View  Result Date: 01/18/2020 CLINICAL DATA:  69 year old female with history of acute respiratory failure. History of atrial fibrillation. EXAM: PORTABLE CHEST 1 VIEW COMPARISON:  Chest x-ray 2020-02-15. FINDINGS: Lung volumes are low. Bibasilar opacities (left greater than right) may reflect areas of atelectasis and/or consolidation. Small left pleural effusion. No right pleural effusion. Cephalization of the pulmonary vasculature with indistinct interstitial markings. Mild  cardiomegaly. Upper mediastinal contours are distorted by patient's rotation to the left. IMPRESSION: 1. The appearance the chest again suggests mild congestive heart failure. 2. Additional bibasilar opacities may reflect areas of atelectasis and/or consolidation. Small left pleural effusion. Electronically Signed   By: Vinnie Langton M.D.   On: 01/18/2020 07:23   DG Chest Portable 1 View  Result Date: 02-15-2020 CLINICAL DATA:  Tachypnea EXAM: PORTABLE CHEST 1 VIEW COMPARISON:  01/06/2020, CT chest 11/02/2017 FINDINGS: Cardiomegaly with vascular congestion and diffuse interstitial and hazy opacities suspicious for edema. More confluent airspace disease at the left base. No pneumothorax. IMPRESSION: 1. Cardiomegaly with vascular congestion and probable pulmonary edema. 2. Possible small pleural effusions. Hazy atelectasis or pneumonia at the bases. Overall the findings do not appear greatly changed from 01/06/2020. Electronically Signed   By: Donavan Foil M.D.   On: Feb 15, 2020 19:37    ASSESSMENT / PLAN:  Acute on chronic respiratory failure secondary to pulmonary edema and pickwickian syndrome   Hx: Morbid Obesity Supplemental O2 for dyspnea and/or hypoxia  CPAP or Bipap qhs or when sleepy PRN bronchodilator therapy    Septic shock  Urinary tract infection Hx: PVD, HTN, Hyperlipidemia, and Chronic Atrial Fibrillation Continuous telemetry monitoring  Hold outpatient antihypertensives  and cardizem for now  PRN metoprolol for sustained hr >120 bpm Discontinue Eliquis due to hematuria Required central access Gentle fluid rehydration and prn neo-synephrine to maintain map >65  Acute on chronic renal failure  Lactic acidosis  Trend BMP and lactic acid  Replace electrolytes as indicated Monitor UOP Avoid nephrotoxic medications Discontinue Eliquis due to worsening renal function and hematuria  Leukocytosis secondary to UTI  Possible pneumonia as well Trend WBC and monitor fever  curve Trend PCT  Follow cultures  Continue vancomycin and cefepime for now; if MRSA PCR negative will discontinue vancomycin   Anemia without obvious acute blood loss Trend CBC Monitor for s/sx of bleeding  Transfuse for hgb <7 and/or signs of active bleeding   Type II Diabetes Mellitus  CBG q4hrs  SSI   Depression  Psychosis  Continue outpatient risperdal   Best Practice: VTE px: Discontinued Eliquis due to hematuria, mechanical DVT prophylaxis SUP px: not indicated  Diet: heart healthy once off of Bipap     Multidisciplinary rounds were performed with ICU/SDU staff. Patient's husband was apprised at bedside.    Gailen Shelter, MD [place] [facility]   *This note was dictated using voice recognition software/Dragon.  Despite best efforts to proofread, errors can occur which can change the meaning.  Any change was purely unintentional.

## 2020-01-18 NOTE — Procedures (Signed)
Central Venous Catheter Insertion Procedure Note Jasmine Osborn [id number] June 09, 1951  Procedure: Insertion of Central Venous Catheter Indications: Assessment of intravascular volume, Drug and/or fluid administration and Frequent blood sampling  Procedure Details Consent: Risks of procedure as well as the alternatives and risks of each were explained to the (patient/caregiver).  Consent for procedure obtained. Time Out: Verified patient identification, verified procedure, site/side was marked, verified correct patient position, special equipment/implants available, medications/allergies/relevent history reviewed, required imaging and test results available.  Performed  Maximum sterile technique was used including antiseptics, cap, gloves, gown, hand hygiene, mask and sheet. Skin prep: Chlorhexidine; local anesthetic administered A antimicrobial bonded/coated triple lumen catheter was placed in the right femoral vein due to multiple attempts, no other available access using the Seldinger technique.  Evaluation Blood flow good Complications: No apparent complications Patient did tolerate procedure well. Chest X-ray ordered to verify placement.  CXR: pending.  Attempted to place right internal jugular central line utilizing ultrasound, however unable to thread guidewire.  Therefore, placed right femoral central line utilizing ultrasound no complications noted during and following procedure.  Sonda Rumble, AGNP  Pulmonary/Critical Care Pager [phone] (please enter 7 digits) PCCM Consult Pager [phone] (please enter 7 digits)

## 2020-01-19 DIAGNOSIS — R6521 Severe sepsis with septic shock: Secondary | ICD-10-CM | POA: Diagnosis not present

## 2020-01-19 DIAGNOSIS — A419 Sepsis, unspecified organism: Secondary | ICD-10-CM | POA: Diagnosis not present

## 2020-01-19 LAB — HEMOGLOBIN A1C
Hgb A1c MFr Bld: 5 % (ref 4.8–5.6)
Mean Plasma Glucose: 97 mg/dL

## 2020-01-19 LAB — CBC WITH DIFFERENTIAL/PLATELET
Abs Immature Granulocytes: 1.4 10*3/uL — ABNORMAL HIGH (ref 0.00–0.07)
Basophils Absolute: 0 10*3/uL (ref 0.0–0.1)
Basophils Relative: 0 %
Eosinophils Absolute: 0.3 10*3/uL (ref 0.0–0.5)
Eosinophils Relative: 2 %
HCT: 30.2 % — ABNORMAL LOW (ref 36.0–46.0)
Hemoglobin: 8.6 g/dL — ABNORMAL LOW (ref 12.0–15.0)
Immature Granulocytes: 7 %
Lymphocytes Relative: 3 %
Lymphs Abs: 0.6 10*3/uL — ABNORMAL LOW (ref 0.7–4.0)
MCH: 28.3 pg (ref 26.0–34.0)
MCHC: 28.5 g/dL — ABNORMAL LOW (ref 30.0–36.0)
MCV: 99.3 fL (ref 80.0–100.0)
Monocytes Absolute: 0.7 10*3/uL (ref 0.1–1.0)
Monocytes Relative: 3 %
Neutro Abs: 17.7 10*3/uL — ABNORMAL HIGH (ref 1.7–7.7)
Neutrophils Relative %: 85 %
Platelets: 170 10*3/uL (ref 150–400)
RBC: 3.04 MIL/uL — ABNORMAL LOW (ref 3.87–5.11)
RDW: 19.3 % — ABNORMAL HIGH (ref 11.5–15.5)
Smear Review: NORMAL
WBC: 20.7 10*3/uL — ABNORMAL HIGH (ref 4.0–10.5)
nRBC: 0 % (ref 0.0–0.2)

## 2020-01-19 LAB — COMPREHENSIVE METABOLIC PANEL
ALT: 13 U/L (ref 0–44)
AST: 16 U/L (ref 15–41)
Albumin: 2.8 g/dL — ABNORMAL LOW (ref 3.5–5.0)
Alkaline Phosphatase: 65 U/L (ref 38–126)
Anion gap: 8 (ref 5–15)
BUN: 47 mg/dL — ABNORMAL HIGH (ref 8–23)
CO2: 28 mmol/L (ref 22–32)
Calcium: 8.2 mg/dL — ABNORMAL LOW (ref 8.9–10.3)
Chloride: 105 mmol/L (ref 98–111)
Creatinine, Ser: 2.49 mg/dL — ABNORMAL HIGH (ref 0.44–1.00)
GFR calc Af Amer: 22 mL/min — ABNORMAL LOW (ref >60)
GFR calc non Af Amer: 19 mL/min — ABNORMAL LOW (ref >60)
Glucose, Bld: 220 mg/dL — ABNORMAL HIGH (ref 70–99)
Potassium: 4.7 mmol/L (ref 3.5–5.1)
Sodium: 141 mmol/L (ref 135–145)
Total Bilirubin: 0.7 mg/dL (ref 0.3–1.2)
Total Protein: 6.1 g/dL — ABNORMAL LOW (ref 6.5–8.1)

## 2020-01-19 LAB — GLUCOSE, CAPILLARY
Glucose-Capillary: 148 mg/dL — ABNORMAL HIGH (ref 70–99)
Glucose-Capillary: 155 mg/dL — ABNORMAL HIGH (ref 70–99)
Glucose-Capillary: 190 mg/dL — ABNORMAL HIGH (ref 70–99)
Glucose-Capillary: 210 mg/dL — ABNORMAL HIGH (ref 70–99)
Glucose-Capillary: 213 mg/dL — ABNORMAL HIGH (ref 70–99)
Glucose-Capillary: 250 mg/dL — ABNORMAL HIGH (ref 70–99)

## 2020-01-19 LAB — MAGNESIUM: Magnesium: 2.7 mg/dL — ABNORMAL HIGH (ref 1.7–2.4)

## 2020-01-19 LAB — LACTIC ACID, PLASMA: Lactic Acid, Venous: 1.4 mmol/L (ref 0.5–1.9)

## 2020-01-19 LAB — PHOSPHORUS: Phosphorus: 6.4 mg/dL — ABNORMAL HIGH (ref 2.5–4.6)

## 2020-01-19 MED ORDER — LACTATED RINGERS IV BOLUS: 500.0000 mL | Freq: Once | INTRAVENOUS | Status: DC

## 2020-01-19 MED ORDER — SODIUM CHLORIDE 0.9 % IV SOLN
2.0000 g | INTRAVENOUS | Status: DC
  Administered 2020-01-20: 2 g via INTRAVENOUS
  Filled 2020-01-19 (×3): qty 20

## 2020-01-19 NOTE — Evaluation (Signed)
Occupational Therapy Evaluation Patient Details Name: Jasmine Osborn MRN: [id number] DOB: May 16, 1951 Today's Date: 01/19/2020    History of Present Illness Pt is a 69 y/o F with PMH: Afib, COPD, DM, morbid obesity, depression, HTN, PVD, chronic stasis edema, previous smoker. Pt previously adm to this facility d/t vaginal bleeding. Underwent D&C with heteroscopy and IUD placement on 01/05/20. On this adm, pt presented from [facility] sweaty with hypotension. Pt found to be in septic shock with UTI source.   Clinical Impression   Pt was seen for OT evaluation this date. Prior to hospital admission, pt was recovering from previous/recent hospitalization and undergoing therapy at [facility]. Pt lives with husband in [facility] with 3-5 steps to enter (pt reports 3, previous eval states 5). Pt reports primarily using 4WW for fxl mobility at home at baseline, but there is also mention of w/c use for fxl mobility. Pt is somewhat questionable historian (see below for more detail). Currently pt demonstrates impairments as described below (See OT problem list) which functionally limit her ability to perform ADL/self-care tasks. Pt currently requires MIN/MOD A for bed level UB ADLs, requires MAX to TOTAL A with LB ADLs including bathing/dressing/toileting.  Pt requires MOD/MAX A +2 for lateral rolling in bed. Pt would benefit from skilled OT to address noted impairments and functional limitations (see below for any additional details) in order to maximize safety and independence while minimizing falls risk and caregiver burden. Upon hospital discharge, recommend STR to maximize pt safety and return to PLOF.     Follow Up Recommendations  SNF    Equipment Recommendations  Other (comment)(defer to next level of care)    Recommendations for Other Services       Precautions / Restrictions Precautions Precautions: Fall Precaution Comments: Pt with R femoral tripple lumen IV Restrictions Weight Bearing  Restrictions: No      Mobility Bed Mobility Overal bed mobility: Needs Assistance Bed Mobility: Rolling Rolling: Mod assist;Max assist;+2 for physical assistance            Transfers                 General transfer comment: NT on OT eval this date. RN reports still having difficulty weaning Levo.    Balance                                           ADL either performed or assessed with clinical judgement   ADL Overall ADL's : Needs assistance/impaired Eating/Feeding: Minimal assistance;Bed level Eating/Feeding Details (indicate cue type and reason): HOB elevated Grooming: Wash/dry hands;Wash/dry face;Bed level;Minimal assistance Grooming Details (indicate cue type and reason): HOB elevated Upper Body Bathing: Moderate assistance;Bed level Upper Body Bathing Details (indicate cue type and reason): using lateral rolling technique Lower Body Bathing: Maximal assistance;Total assistance;Bed level Lower Body Bathing Details (indicate cue type and reason): using lateral rolling technique Upper Body Dressing : Moderate assistance;Bed level Upper Body Dressing Details (indicate cue type and reason): to don clean gown to front side Lower Body Dressing: Maximal assistance;Total assistance;Bed level Lower Body Dressing Details (indicate cue type and reason): to don socks, pt makes some kind of effort to bend R knee to particiapte, but is unable. Pt's L foot/knee, noted to be externally rotated.   Toilet Transfer Details (indicate cue type and reason): unable to attempt this date. Toileting- Clothing Manipulation and Hygiene: Maximal assistance;Bed  level Toileting - Clothing Manipulation Details (indicate cue type and reason): using lateral rolling technique, pt able to participate in rolling and hold bed rails, but limited in peri care participation d/t limited ROM/abdominal circumference.             Vision Baseline Vision/History: Wears glasses Wears  Glasses: At all times Patient Visual Report: No change from baseline Additional Comments: pt reports baseline vision difficulties, noted to have some delayed tracking on assessment.     Perception     Praxis      Pertinent Vitals/Pain Pain Assessment: No/denies pain     Hand Dominance Right   Extremity/Trunk Assessment Upper Extremity Assessment Upper Extremity Assessment: Generalized weakness   Lower Extremity Assessment Lower Extremity Assessment: Defer to PT evaluation;Generalized weakness       Communication     Cognition Arousal/Alertness: Awake/alert Behavior During Therapy: WFL for tasks assessed/performed;Flat affect Overall Cognitive Status: No family/caregiver present to determine baseline cognitive functioning                                 General Comments: pt with delayed processing. Answers A&O questions appropriately, but demos increased response time and some conflicting PLOF information.   General Comments       Exercises Other Exercises Other Exercises: OT facilitates education with pt re: role of OT in acute setting. Pt with good understanding as well as some familiarity of role of OT d/t recent rehab and hospital stay.   Shoulder Instructions      Home Living Family/patient expects to be discharged to:: Skilled nursing facility Living Arrangements: Spouse/significant other Available Help at Discharge: Family;Available 24 hours/day Type of Home: House Home Access: Stairs to enter;Ramped entrance Entrance Stairs-Number of Steps: 5   Home Layout: One level     Bathroom Shower/Tub: Walk-in shower;Tub/shower unit   Bathroom Toilet: Standard Bathroom Accessibility: Yes   Home Equipment: Walker - 2 wheels;Bedside commode;Wheelchair - manual;Cane - single point;Grab bars - toilet   Additional Comments: Pt repots no falls in the last 6 months. On home O2 x6 months. Pt reports primarily using 4WW, also mentions w/c use.      Prior  Functioning/Environment Level of Independence: Needs assistance  Gait / Transfers Assistance Needed: Pt with some conflicting mobility information. While pt correct on orientation. States she hasn't been walking "too much" and states she uses a w/c. Then states she doesn't have a w/c and usually uses 4WW. Also mentions SPC, but later states she does not have SPC. ADL's / Homemaking Assistance Needed: Pt states that recently, her husband has helped with dressing/bathing. States he drives and performs all household IADLs including cleaning/cooking. Pt reports she was able to stand to shower, no shower seat-unsure of efficacy of this statement.            OT Problem List: Decreased strength;Decreased cognition;Decreased safety awareness;Decreased activity tolerance;Impaired balance (sitting and/or standing);Decreased knowledge of use of DME or AE;Obesity      OT Treatment/Interventions: Self-care/ADL training;Therapeutic exercise;Therapeutic activities;Energy conservation;Cognitive remediation/compensation;DME and/or AE instruction;Patient/family education;Balance training    OT Goals(Current goals can be found in the care plan section) Acute Rehab OT Goals Patient Stated Goal: to get stronger OT Goal Formulation: With patient Time For Goal Achievement: 02/02/20 Potential to Achieve Goals: Good  OT Frequency: Min 2X/week   Barriers to D/C:            Co-evaluation  AM-PAC OT "6 Clicks" Daily Activity     Outcome Measure Help from another person eating meals?: A Little Help from another person taking care of personal grooming?: A Little Help from another person toileting, which includes using toliet, bedpan, or urinal?: Total Help from another person bathing (including washing, rinsing, drying)?: A Lot Help from another person to put on and taking off regular upper body clothing?: A Lot Help from another person to put on and taking off regular lower body clothing?:  Total 6 Click Score: 12   End of Session Nurse Communication: Other (comment)(notified RN about potential need for prevalon boot for L LE as L foot/knee are noted to be laterally rotated with pt limited in ability to self-correct 2/2 weakness. RN confirms understanding.)  Activity Tolerance: Treatment limited secondary to medical complications (Comment) Patient left: in bed;with call bell/phone within reach;with bed alarm set  OT Visit Diagnosis: Other abnormalities of gait and mobility (R26.89);Muscle weakness (generalized) (M62.81)                Time: 8466-5993 OT Time Calculation (min): 23 min Charges:  OT General Charges $OT Visit: 1 Visit OT Evaluation $OT Eval Moderate Complexity: 1 Mod OT Treatments $Self Care/Home Management : 8-22 mins  Rejeana Brock, MS, OTR/L ascom [phone] 01/19/20, 2:34 PM

## 2020-01-19 NOTE — Progress Notes (Signed)
Name: Jasmine Osborn MRN: [id number] DOB: 02/04/1951    PROGRESS NOTE:  ADMISSION DATE:  01/19/2020  Presenting Hospital problem: Hypotension   BRIEF PATIENT DESCRIPTION: 69 yo female admitted with septic shock of urinary tract infection source.  SIGNIFICANT EVENTS/STUDIES:  04/12: Pt admitted to ICU with septic shock secondary to UTI  04/13: Hematuria, Apixiban d/cd. UTI +/- PNA as well  4/14 remains on pressors   Prior to Admission medications   Medication Sig Start Date End Date Taking? Authorizing Provider  apixaban (ELIQUIS) 5 MG TABS tablet Take 5 mg by mouth 2 (two) times daily.   Yes [provider]  cephALEXin (KEFLEX) 500 MG capsule Take 1 capsule (500 mg total) by mouth every 8 (eight) hours. 01/08/20  Yes Lynn Ito, MD  collagenase (SANTYL) ointment Apply topically daily. 01/09/20  Yes Lynn Ito, MD  diltiazem (CARDIZEM CD) 120 MG 24 hr capsule Take 1 capsule (120 mg total) by mouth daily. 01/08/20  Yes Lynn Ito, MD  fenofibrate 160 MG tablet Take 160 mg by mouth daily.   Yes [provider]  hydrocerin (EUCERIN) CREA Apply 1 application topically daily. 01/11/20  Yes Amery, Freddi Che, MD  KLOR-CON 10 10 MEQ tablet Take 20 mEq by mouth daily.  10/22/16  Yes [provider]  latanoprost (XALATAN) 0.005 % ophthalmic solution USE 1 DROP IN [facility] EYE AT BEDTIME 05/18/15  Yes [provider]  magnesium oxide (MAG-OX) 400 (241.3 Mg) MG tablet Take 1 tablet (400 mg total) by mouth 2 (two) times daily. 01/10/20  Yes Lynn Ito, MD  metFORMIN (GLUCOPHAGE) 500 MG tablet Take 500 mg by mouth 2 (two) times daily with a meal.   Yes [provider]  metoprolol succinate (TOPROL-XL) 50 MG 24 hr tablet Take 1 tablet (50 mg total) by mouth 2 (two) times daily. 01/10/20  Yes Lynn Ito, MD  mirtazapine (REMERON) 30 MG tablet Take 30 mg by mouth at bedtime.   Yes [provider]  multivitamin-lutein (OCUVITE-LUTEIN) CAPS capsule Take 1  capsule by mouth daily. 11/09/17  Yes Wieting, Richard, MD  nystatin (MYCOSTATIN/NYSTOP) powder Apply 1 application topically 3 (three) times daily.   Yes [provider]  risperiDONE (RISPERDAL) 1 MG tablet Take 1 tablet (1 mg total) by mouth at bedtime. 01/03/17  Yes Enid Baas, MD  torsemide (DEMADEX) 20 MG tablet Take 20 mg by mouth daily.   Yes [provider]  tranexamic acid (LYSTEDA) 650 MG TABS tablet Take 2 tablets (1,300 mg total) by mouth 3 (three) times daily. 01/08/20  Yes Lynn Ito, MD  vitamin B-12 (CYANOCOBALAMIN) 1000 MCG tablet Take 1 tablet (1,000 mcg total) by mouth daily. 01/08/20  Yes Lynn Ito, MD  vitamin C (ASCORBIC ACID) 250 MG tablet Take 500 mg by mouth 2 (two) times daily.   Yes [provider]  zinc sulfate 220 (50 Zn) MG capsule Take 220 mg by mouth daily.   Yes [provider]  metoprolol succinate (TOPROL-XL) 50 MG 24 hr tablet Take 50 mg by mouth daily. 09/04/17   [provider]   No Known Allergies     Review of Systems:  Gen:  Denies  fever, sweats, chills weight loss  HEENT: Denies blurred vision, double vision, ear pain, eye pain, hearing loss, nose bleeds, sore throat Cardiac:  No dizziness, chest pain or heaviness, chest tightness,edema, No JVD Resp:   No cough, -sputum production, -shortness of breath,-wheezing, -hemoptysis,  Gi: Denies swallowing difficulty, stomach pain, nausea or vomiting,  diarrhea, constipation, bowel incontinence other:  All other systems negative    VITAL SIGNS: Temp:  [97.4 F (36.3 C)-98.4 F (36.9 C)] 98.3 F (36.8 C) (04/14 0700) Pulse Rate:  [79-124] 87 (04/14 0700) Resp:  [18-47] 32 (04/14 0700) BP: (88-166)/(32-137) 113/43 (04/14 0700) SpO2:  [86 %-100 %] 97 % (04/14 0748) FiO2 (%):  [30 %-35 %] 35 % (04/14 0400) Weight:  [120.5 kg] 120.5 kg (04/14 0400)   Physical Examination:   General Appearance: No distress obese Neuro:without focal findings,   speech normal,  HEENT: PERRLA, EOM intact.   Pulmonary: normal breath sounds, No wheezing.  CardiovascularNormal S1,S2.  No m/r/g.   Abdomen: Benign, Soft, non-tender. Renal:  No costovertebral tenderness  GU:  Not performed at this time. Endoc: No evident thyromegaly Skin:   warm, no rashes, no ecchymosis  Extremities: +edema PSYCHIATRIC: Mood, affect within normal limits.   ALL OTHER ROS ARE NEGATIVE    Recent Labs  Lab 02/02/2020 1916 01/18/20 0341 01/19/20 0456  NA 141 143 141  K 3.7 4.4 4.7  CL 105 106 105  CO2 23 26 28   BUN 32* 35* 47*  CREATININE 2.47* 2.84* 2.49*  GLUCOSE 152* 120* 220*   Recent Labs  Lab 01/15/2020 1916 01/18/20 1924 01/19/20 0456  HGB 9.9* 8.9* 8.6*  HCT 34.7* 31.4* 30.2*  WBC 10.6* 24.8* 20.7*  PLT 172 184 170   DG Chest Port 1 View  Result Date: 01/18/2020 CLINICAL DATA:  69 year old female with history of acute respiratory failure. History of atrial fibrillation. EXAM: PORTABLE CHEST 1 VIEW COMPARISON:  Chest x-ray 01/16/2020. FINDINGS: Lung volumes are low. Bibasilar opacities (left greater than right) may reflect areas of atelectasis and/or consolidation. Small left pleural effusion. No right pleural effusion. Cephalization of the pulmonary vasculature with indistinct interstitial markings. Mild cardiomegaly. Upper mediastinal contours are distorted by patient's rotation to the left. IMPRESSION: 1. The appearance the chest again suggests mild congestive heart failure. 2. Additional bibasilar opacities may reflect areas of atelectasis and/or consolidation. Small left pleural effusion. Electronically Signed   By: Vinnie Langton M.D.   On: 01/18/2020 07:23   DG Chest Portable 1 View  Result Date: 01/07/2020 CLINICAL DATA:  Tachypnea EXAM: PORTABLE CHEST 1 VIEW COMPARISON:  01/06/2020, CT chest 11/02/2017 FINDINGS: Cardiomegaly with vascular congestion and diffuse interstitial and hazy opacities suspicious for edema. More confluent airspace  disease at the left base. No pneumothorax. IMPRESSION: 1. Cardiomegaly with vascular congestion and probable pulmonary edema. 2. Possible small pleural effusions. Hazy atelectasis or pneumonia at the bases. Overall the findings do not appear greatly changed from 01/06/2020. Electronically Signed   By: Donavan Foil M.D.   On: 01/16/2020 19:37    ASSESSMENT / PLAN:  ACUTE Hypoxic and Hypercapnic Respiratory Failure secondary to pulmonary edema and pickwickian syndrome from severe sepsis and septic shock biPAP for OSA and when asleep Wean off pressors as tolerated   Septic shock -use vasopressors to keep MAP>65 -follow up cultures -emperic ABX -consider stress dose steroids Hold outpatient antihypertensives and cardizem for now  Discontinue Eliquis due to hematuria Required central access Gentle fluid rehydration and prn neo-synephrine to maintain map >65 Start MIDODRINE   ACUTE KIDNEY INJURY/Renal Failure -follow chem 7 -follow UO -continue Foley Catheter-assess need -Avoid nephrotoxic agents  INFECTIOUS DISEASE -continue antibiotics as prescribed -follow up cultures  Best Practice: VTE px: Discontinued Eliquis due to hematuria, mechanical DVT prophylaxis SUP px: not indicated  Diet: heart healthy once off of Bipap  REMAINS SD STATUS  Lucie Leather, M.D.  [facility]  Medical Director [facility] Medical Director [facility] Cardio-Pulmonary Department

## 2020-01-19 NOTE — Progress Notes (Addendum)
Alerted Dr. Belia Heman that patient arrived to floor at 1830 and BP (taken on right arm) was 86/27. MD stated to retry with a different cuff. At 1833 BP is 113/36 (61) on left arm. ICU RN called and stated that BP can only be taken on legs or wrists, which had not been relayed in report. Took BP on right lower leg at 1835 and BP is 119/59 (80). MD stated that this is fine and not to give bolus of LR. Bolus not given per MD orders. Also alerted MD that patient's breathing is slightly labored and tachypnic, but oxygen saturation is stable on 3L. MD stated he would ensure CPAP is ordered for night time but no new orders.  Aside from all this, patient is alert and awake. Patient is not in distress and states that she feels fine. All safety measures are in place, fluids running. Will pass all this along to night shift.

## 2020-01-19 NOTE — Progress Notes (Signed)
Inpatient Diabetes Program Recommendations  AACE/ADA: New Consensus Statement on Inpatient Glycemic Control   Target Ranges:  Prepandial:   less than 140 mg/dL      Peak postprandial:   less than 180 mg/dL (1-2 hours)      Critically ill patients:  140 - 180 mg/dL   Results for BRIGGETT, TUCCILLO (MRN [id number]) as of 01/19/2020 14:31  Ref. Range 01/18/2020 07:15 01/18/2020 11:36 01/18/2020 16:16 01/18/2020 19:36 01/18/2020 23:48 01/19/2020 04:14 01/19/2020 07:32 01/19/2020 11:22  Glucose-Capillary Latest Ref Range: 70 - 99 mg/dL 409 (H) 811 (H) 914 (H) 215 (H) 225 (H) 213 (H) 190 (H) 250 (H)   Review of Glycemic Control  Diabetes history: DM2 Outpatient Diabetes medications: Metformin 500 mg BID Current orders for Inpatient glycemic control: Novolog 0-9 units Q4H; Solucortef 50 mg Q6H  Inpatient Diabetes Program Recommendations:   Insulin - Basal: If steroids are continued, please consider ordering Lantus 10 units Q24H.  Thanks, Orlando Penner, RN, MSN, CDE Diabetes Coordinator Inpatient Diabetes Program [phone] (Team Pager from 8am to 5pm)

## 2020-01-19 NOTE — Evaluation (Addendum)
Physical Therapy Evaluation Patient Details Name: Jasmine Osborn MRN: [id number] DOB: [birth year] Today's Date: 01/19/2020   History of Present Illness  Pt is a 69 y/o F with PMH: Afib, COPD, DM, morbid obesity, depression, HTN, PVD, chronic stasis edema, previous smoker. Pt previously adm to this facility d/t vaginal bleeding. Underwent D&C with heteroscopy and IUD placement on 01/05/20. On this adm, pt presented from [facility] sweaty with hypotension. Pt found to be in septic shock with UTI source.  Clinical Impression  Pt is a pleasant 69 year old female who was admitted for septic shock secondary to UTI. Pt performs bed mobility with mod assist, transfers with min assist +2, and ambulation with min assist and RW. Pt does a great deal better than expected. O2 sats remained stable on 4L of O2. HR increased to 140s with exertion, further ambulation deferred. Pt demonstrates deficits with strength/mobility/balance. Reports chronic B numbness in feet. Pt reports she has made significant progress at SNF and is hopeful to dc home after this admission. Pt is very motivated. Encouraged continued mobility efforts with RN staff to improve independence with ADLs. Would benefit from skilled PT to address above deficits and promote optimal return to PLOF. Recommend transition to HHPT upon discharge from acute hospitalization.     Follow Up Recommendations Home health PT;Supervision/Assistance - 24 hour    Equipment Recommendations  None recommended by PT    Recommendations for Other Services       Precautions / Restrictions Precautions Precautions: Fall Precaution Comments: Pt with R femoral tripple lumen IV Restrictions Weight Bearing Restrictions: No      Mobility  Bed Mobility Overal bed mobility: Needs Assistance Bed Mobility: Supine to Sit Rolling: Mod assist;Max assist;+2 for physical assistance   Supine to sit: Mod assist     General bed mobility comments: able to come to EOB  with mod assist and additional person for line management. Once seated, able to sit with upright posture  Transfers Overall transfer level: Needs assistance Equipment used: Rolling walker (2 wheeled) Transfers: Sit to/from Stand Sit to Stand: Min assist;+2 physical assistance         General transfer comment: safe technique. On 2nd attempt, able to stand with min assist of 1. UPright posture noted. All mobility performed on 4L of O2 with sats at 97%.  Ambulation/Gait Ambulation/Gait assistance: Min assist Gait Distance (Feet): 10 Feet Assistive device: Rolling walker (2 wheeled) Gait Pattern/deviations: Step-to pattern     General Gait Details: ambulated around room towards recliner. Additional person needed for line management. Step to gait pattern performed  Stairs            Wheelchair Mobility    Modified Rankin (Stroke Patients Only)       Balance Overall balance assessment: Needs assistance Sitting-balance support: Feet supported;Feet unsupported;No upper extremity supported Sitting balance-Leahy Scale: Good     Standing balance support: Bilateral upper extremity supported Standing balance-Leahy Scale: Good                               Pertinent Vitals/Pain Pain Assessment: No/denies pain    Home Living Family/patient expects to be discharged to:: Private residence Living Arrangements: Spouse/significant other Available Help at Discharge: Family;Available 24 hours/day Type of Home: House Home Access: Stairs to enter;Ramped entrance Entrance Stairs-Rails: Can reach both Entrance Stairs-Number of Steps: 3 Home Layout: One level Home Equipment: Walker - 2 wheels;Bedside commode;Wheelchair - manual;Cane - single  point;Grab bars - toilet Additional Comments: Pt repots no falls in the last 6 months. On home O2 x6 months. Pt reports primarily using 4WW. Previously didn't use rollater    Prior Function Level of Independence: Needs assistance    Gait / Transfers Assistance Needed: She reports she has been ambulating at rehab with RW.   ADL's / Homemaking Assistance Needed: Pt states that recently, her husband has helped with dressing/bathing. States he drives and performs all household IADLs including cleaning/cooking. Pt reports she was able to stand to shower, no shower seat-unsure of efficacy of this statement.        Hand Dominance   Dominant Hand: Right    Extremity/Trunk Assessment   Upper Extremity Assessment Upper Extremity Assessment: Generalized weakness(B UE grossly 3+/5)    Lower Extremity Assessment Lower Extremity Assessment: Generalized weakness(B LE grossly 4/5)       Communication   Communication: No difficulties  Cognition Arousal/Alertness: Awake/alert Behavior During Therapy: WFL for tasks assessed/performed;Flat affect Overall Cognitive Status: Within Functional Limits for tasks assessed                                 General Comments: delayed processing, however alert and oriented..      General Comments      Exercises Other Exercises Other Exercises: OT facilitates education with pt re: role of OT in acute setting. Pt with good understanding as well as some familiarity of role of OT d/t recent rehab and hospital stay. Other Exercises: B LE ther-ex performed including alt marching, LAQ, and hip abd/add. All ther-ex performed x 10 reps with  cga. Other Exercises: Stood twice with additional person to assist in hygiene due to incontient BM.    Assessment/Plan    PT Assessment Patient needs continued PT services  PT Problem List Decreased strength;Decreased mobility;Decreased range of motion;Decreased activity tolerance;Decreased balance;Pain;Decreased knowledge of use of DME;Obesity       PT Treatment Interventions DME instruction;Therapeutic activities;Gait training;Therapeutic exercise;Patient/family education;Balance training;Functional mobility training;Neuromuscular  re-education    PT Goals (Current goals can be found in the Care Plan section)  Acute Rehab PT Goals Patient Stated Goal: to get stronger PT Goal Formulation: With patient Time For Goal Achievement: 02/02/20 Potential to Achieve Goals: Good    Frequency Min 2X/week   Barriers to discharge        Co-evaluation               AM-PAC PT "6 Clicks" Mobility  Outcome Measure Help needed turning from your back to your side while in a flat bed without using bedrails?: A Little Help needed moving from lying on your back to sitting on the side of a flat bed without using bedrails?: A Little Help needed moving to and from a bed to a chair (including a wheelchair)?: A Little Help needed standing up from a chair using your arms (e.g., wheelchair or bedside chair)?: A Little Help needed to walk in hospital room?: A Little Help needed climbing 3-5 steps with a railing? : A Lot 6 Click Score: 17    End of Session Equipment Utilized During Treatment: Gait belt;Oxygen Activity Tolerance: Patient tolerated treatment well Patient left: in chair Nurse Communication: Mobility status PT Visit Diagnosis: Other abnormalities of gait and mobility (R26.89);Muscle weakness (generalized) (M62.81)    Time: 6659-9357 PT Time Calculation (min) (ACUTE ONLY): 28 min   Charges:   PT Evaluation $PT Eval Moderate Complexity:  1 Mod PT Treatments $Therapeutic Exercise: 8-22 mins        Greggory Stallion, [place], DPT [phone]   Deniya Craigo 01/19/2020, 4:37 PM

## 2020-01-19 NOTE — Progress Notes (Signed)
Patient alert with some intermit confusion. She is on 3 liters nasal cannula. No complaints of pain or shortness of breath. Patient has been incontinent of urine and stool. Levophed titrated off at 10:20. Called husband to update and patients transfer. Report called to [employer]. Patient does have orders for cardiac monitor.

## 2020-01-19 NOTE — Progress Notes (Signed)
   01/19/20 1830  Assess: MEWS Score  Temp 97.9 F (36.6 C)  BP (!) 86/27  Pulse Rate 86  Resp (!) 25  Level of Consciousness Alert  Assess: MEWS Score  MEWS Temp 0  MEWS Systolic 1  MEWS Pulse 0  MEWS RR 1  MEWS LOC 0  MEWS Score 2  MEWS Score Color Yellow  Assess: if the MEWS score is Yellow or Red  Were vital signs taken at a resting state? Yes  Focused Assessment Documented focused assessment  Early Detection of Sepsis Score *See Row Information* High  MEWS guidelines implemented *See Row Information* No, vital signs rechecked  Treat  MEWS Interventions Other (Comment) (vitals recheckd and physician notified)  Take Vital Signs  Increase Vital Sign Frequency  Yellow: Q 2hr X 2 then Q 4hr X 2, if remains yellow, continue Q 4hrs  Escalate  MEWS: Escalate Yellow: discuss with charge nurse/RN and consider discussing with provider and RRT  Notify: Charge Nurse/RN  Name of Charge Nurse/RN Notified Debi, rn  Date Charge Nurse/RN Notified 01/19/20  Time Charge Nurse/RN Notified 1830  Notify: Provider  Provider Name/Title Dr. Belia Heman   Date Provider Notified 01/19/20  Time Provider Notified [phone]  Notification Type Page  Notification Reason Other (Comment) (see vitals)  Response No new orders  Date of Provider Response 01/19/20  Time of Provider Response 1830  Document  Patient Outcome Other (Comment) (patient is stable, no intervntion needed)  Progress note created (see row info) Yes

## 2020-01-19 NOTE — Progress Notes (Signed)
Transfer from ICU Patient admitted to the ICU for septic shock from urinary source, Urine culture yields greater than 100,000 CFU of E coli. Patient is off pressors and is on IV cefepime CODE STATUS - DNR/DNI

## 2020-01-20 DIAGNOSIS — E662 Morbid (severe) obesity with alveolar hypoventilation: Secondary | ICD-10-CM

## 2020-01-20 DIAGNOSIS — N390 Urinary tract infection, site not specified: Secondary | ICD-10-CM

## 2020-01-20 DIAGNOSIS — Z7189 Other specified counseling: Secondary | ICD-10-CM | POA: Diagnosis not present

## 2020-01-20 DIAGNOSIS — I4819 Other persistent atrial fibrillation: Secondary | ICD-10-CM

## 2020-01-20 DIAGNOSIS — Z515 Encounter for palliative care: Secondary | ICD-10-CM | POA: Diagnosis not present

## 2020-01-20 DIAGNOSIS — A419 Sepsis, unspecified organism: Secondary | ICD-10-CM | POA: Diagnosis not present

## 2020-01-20 DIAGNOSIS — N179 Acute kidney failure, unspecified: Secondary | ICD-10-CM

## 2020-01-20 DIAGNOSIS — E538 Deficiency of other specified B group vitamins: Secondary | ICD-10-CM

## 2020-01-20 DIAGNOSIS — E1169 Type 2 diabetes mellitus with other specified complication: Secondary | ICD-10-CM

## 2020-01-20 DIAGNOSIS — E119 Type 2 diabetes mellitus without complications: Secondary | ICD-10-CM

## 2020-01-20 DIAGNOSIS — J449 Chronic obstructive pulmonary disease, unspecified: Secondary | ICD-10-CM

## 2020-01-20 DIAGNOSIS — N939 Abnormal uterine and vaginal bleeding, unspecified: Secondary | ICD-10-CM

## 2020-01-20 LAB — URINE CULTURE: Culture: >=100000 — AB

## 2020-01-20 LAB — COMPREHENSIVE METABOLIC PANEL
ALT: 11 U/L (ref 0–44)
AST: 13 U/L — ABNORMAL LOW (ref 15–41)
Albumin: 2.7 g/dL — ABNORMAL LOW (ref 3.5–5.0)
Alkaline Phosphatase: 53 U/L (ref 38–126)
Anion gap: 7 (ref 5–15)
BUN: 49 mg/dL — ABNORMAL HIGH (ref 8–23)
CO2: 27 mmol/L (ref 22–32)
Calcium: 8.8 mg/dL — ABNORMAL LOW (ref 8.9–10.3)
Chloride: 108 mmol/L (ref 98–111)
Creatinine, Ser: 1.81 mg/dL — ABNORMAL HIGH (ref 0.44–1.00)
GFR calc Af Amer: 32 mL/min — ABNORMAL LOW (ref >60)
GFR calc non Af Amer: 28 mL/min — ABNORMAL LOW (ref >60)
Glucose, Bld: 185 mg/dL — ABNORMAL HIGH (ref 70–99)
Potassium: 4.7 mmol/L (ref 3.5–5.1)
Sodium: 142 mmol/L (ref 135–145)
Total Bilirubin: 0.7 mg/dL (ref 0.3–1.2)
Total Protein: 5.9 g/dL — ABNORMAL LOW (ref 6.5–8.1)

## 2020-01-20 LAB — CBC WITH DIFFERENTIAL/PLATELET
Abs Immature Granulocytes: 0.17 10*3/uL — ABNORMAL HIGH (ref 0.00–0.07)
Basophils Absolute: 0 10*3/uL (ref 0.0–0.1)
Basophils Relative: 0 %
Eosinophils Absolute: 0 10*3/uL (ref 0.0–0.5)
Eosinophils Relative: 0 %
HCT: 28.1 % — ABNORMAL LOW (ref 36.0–46.0)
Hemoglobin: 7.9 g/dL — ABNORMAL LOW (ref 12.0–15.0)
Immature Granulocytes: 2 %
Lymphocytes Relative: 3 %
Lymphs Abs: 0.3 10*3/uL — ABNORMAL LOW (ref 0.7–4.0)
MCH: 28.3 pg (ref 26.0–34.0)
MCHC: 28.1 g/dL — ABNORMAL LOW (ref 30.0–36.0)
MCV: 100.7 fL — ABNORMAL HIGH (ref 80.0–100.0)
Monocytes Absolute: 0.3 10*3/uL (ref 0.1–1.0)
Monocytes Relative: 2 %
Neutro Abs: 9.9 10*3/uL — ABNORMAL HIGH (ref 1.7–7.7)
Neutrophils Relative %: 93 %
Platelets: 133 10*3/uL — ABNORMAL LOW (ref 150–400)
RBC: 2.79 MIL/uL — ABNORMAL LOW (ref 3.87–5.11)
RDW: 18.5 % — ABNORMAL HIGH (ref 11.5–15.5)
WBC: 10.7 10*3/uL — ABNORMAL HIGH (ref 4.0–10.5)
nRBC: 0 % (ref 0.0–0.2)

## 2020-01-20 LAB — GLUCOSE, CAPILLARY
Glucose-Capillary: 187 mg/dL — ABNORMAL HIGH (ref 70–99)
Glucose-Capillary: 158 mg/dL — ABNORMAL HIGH (ref 70–99)
Glucose-Capillary: 178 mg/dL — ABNORMAL HIGH (ref 70–99)
Glucose-Capillary: 183 mg/dL — ABNORMAL HIGH (ref 70–99)
Glucose-Capillary: 207 mg/dL — ABNORMAL HIGH (ref 70–99)

## 2020-01-20 LAB — MAGNESIUM: Magnesium: 2.7 mg/dL — ABNORMAL HIGH (ref 1.7–2.4)

## 2020-01-20 LAB — PHOSPHORUS: Phosphorus: 4.4 mg/dL (ref 2.5–4.6)

## 2020-01-20 MED ORDER — HYDROCORTISONE NA SUCCINATE PF 100 MG IJ SOLR
50.0000 mg | Freq: Two times a day (BID) | INTRAMUSCULAR | Status: DC
  Administered 2020-01-20 – 2020-01-21 (×2): 50 mg via INTRAVENOUS
  Filled 2020-01-20 (×3): qty 1

## 2020-01-20 MED ORDER — OCUVITE-LUTEIN PO CAPS
1.0000 | ORAL_CAPSULE | Freq: Every day | ORAL | Status: DC
  Administered 2020-01-20: 1 via ORAL
  Filled 2020-01-20 (×3): qty 1

## 2020-01-20 MED ORDER — INSULIN ASPART 100 UNIT/ML ~~LOC~~ SOLN
0.0000 [IU] | Freq: Three times a day (TID) | SUBCUTANEOUS | Status: DC
  Administered 2020-01-20: 09:00:00 1 [IU] via SUBCUTANEOUS
  Administered 2020-01-20 (×2): 2 [IU] via SUBCUTANEOUS
  Filled 2020-01-20 (×4): qty 1

## 2020-01-20 MED ORDER — DILTIAZEM HCL ER COATED BEADS 120 MG PO CP24
120.0000 mg | ORAL_CAPSULE | Freq: Every day | ORAL | Status: DC
  Administered 2020-01-20: 09:00:00 120 mg via ORAL
  Filled 2020-01-20 (×3): qty 1

## 2020-01-20 MED ORDER — MIRTAZAPINE 15 MG PO TABS
30.0000 mg | ORAL_TABLET | Freq: Every day | ORAL | Status: DC
  Administered 2020-01-20: 22:00:00 30 mg via ORAL
  Filled 2020-01-20: qty 2

## 2020-01-20 NOTE — Care Management Important Message (Signed)
Important Message  Patient Details  Name: Jasmine Osborn MRN: [id number] Date of Birth: May 23, 1951   Medicare Important Message Given:  Yes     Olegario Messier A Loraine Freid 01/20/2020, 11:09 AM

## 2020-01-20 NOTE — Progress Notes (Signed)
CH paged to 1C for RR; when CH arrived, medical team in rm. attending to pt. MD said all was OK -- no need for support @ this time.

## 2020-01-20 NOTE — Plan of Care (Signed)
PMT note:  Full note to follow. Continue current care and DNR status. Plans for family meeting tomorrow at 10:15.

## 2020-01-20 NOTE — NC FL2 (Signed)
[place] LEVEL OF CARE SCREENING TOOL     IDENTIFICATION  Patient Name: Jasmine Osborn Birthdate: 03/04/1951 Sex: female Admission Date (Current Location): 01/30/2020  [place] and [place] Number:  Engineering geologist and Address:  [facility], [address] [place], [place] [address]      Provider Number: [id number]  Attending Physician Name and Address:  Debbe Odea, MD  Relative Name and Phone Number:  Devlin Mcveigh - [phone]    Current Level of Care: Hospital Recommended Level of Care: [place] Prior Approval Number:    Date Approved/Denied:   PASRR Number: Pending  Discharge Plan: SNF    Current Diagnoses: Patient Active Problem List   Diagnosis Date Noted  . AKI (acute kidney injury) ([place]) 01/20/2020  . Obesity hypoventilation syndrome ([place]) 01/20/2020  . COPD (chronic obstructive pulmonary disease) ([place]) 01/20/2020  . B12 deficiency 01/20/2020  . DM (diabetes mellitus), type 2 ([place]) 01/20/2020  . Acute lower UTI 01/20/2020  . Septic shock ([place]) 01/26/2020  . Cellulitis of left lower extremity   . Skin ulcer of toe of left foot, limited to breakdown of skin ([place])   . AF (paroxysmal atrial fibrillation) ([place])   . Symptomatic anemia 01/03/2020  . Vaginal bleeding 01/03/2020  . ARF (acute renal failure) ([place]) 01/03/2020  . Anemia 12/16/2017  . Persistent atrial fibrillation ([place]) 11/05/2017  . Morbid obesity ([place]) 11/05/2017  . Acute on chronic respiratory failure with hypoxia and hypercapnia ([place]) 11/02/2017  . Increased endometrial stripe thickness 06/04/2017  . Pressure injury of skin 12/23/2016  . Respiratory failure with hypercapnia ([place]) 12/22/2016    Orientation RESPIRATION BLADDER Height & Weight     Self, Time, Situation, Place  O2(Chronic O2 3L) Incontinent, External catheter Weight: 120.5 kg Height:  5\' 2"  (157.5 cm)  BEHAVIORAL SYMPTOMS/MOOD NEUROLOGICAL BOWEL NUTRITION  STATUS      Continent Diet(see discharge summary)  AMBULATORY STATUS COMMUNICATION OF NEEDS Skin   Limited Assist Verbally PU Stage and Appropriate Care(pressure sore from bipap to nose- scabbed over, pressure sore stage 2 sacrum, excoriation from MASD)                       Personal Care Assistance Level of Assistance  Bathing, Feeding, Dressing Bathing Assistance: Maximum assistance Feeding assistance: Limited assistance Dressing Assistance: Maximum assistance     Functional Limitations Info             SPECIAL CARE FACTORS FREQUENCY  PT (By licensed PT), OT (By licensed OT)     PT Frequency: 5 times per week OT Frequency: 5 times per week            Contractures Contractures Info: Not present    Additional Factors Info  Code Status, Allergies Code Status Info: DNR Allergies Info: NKA           Current Medications (01/20/2020):  This is the current hospital active medication list Current Facility-Administered Medications  Medication Dose Route Frequency Provider Last Rate Last Admin  . 0.9 %  sodium chloride infusion  250 mL Intravenous Continuous Awilda Bill, NP 10 mL/hr at 01/18/20 0041 250 mL at 01/18/20 0041  . 0.9 %  sodium chloride infusion   Intravenous Continuous Awilda Bill, NP 50 mL/hr at 01/19/20 1847 New Bag at 01/19/20 1847  . 0.9 %  sodium chloride infusion  250 mL Intravenous Continuous Awilda Bill, NP      . cefTRIAXone (ROCEPHIN)  2 g in sodium chloride 0.9 % 100 mL IVPB  2 g Intravenous Q24H Erin Fulling, MD      . Chlorhexidine Gluconate Cloth 2 % PADS 6 each  6 each Topical Daily Salena Saner, MD   6 each at 01/19/20 [phone]  . chlorpheniramine-HYDROcodone (TUSSIONEX) 10-8 MG/5ML suspension 5 mL  5 mL Oral Q12H PRN Salena Saner, MD   5 mL at 01/18/20 1329  . diltiazem (CARDIZEM CD) 24 hr capsule 120 mg  120 mg Oral Daily Calvert Cantor, MD   120 mg at 01/20/20 0906  . docusate sodium (COLACE) capsule 100 mg  100 mg  Oral BID PRN Eugenie Norrie, NP      . hydrocerin (EUCERIN) cream 1 application  1 application Topical Daily Eugenie Norrie, NP      . hydrocortisone sodium succinate (SOLU-CORTEF) 100 MG injection 50 mg  50 mg Intravenous Q12H Rizwan, Saima, MD      . insulin aspart (novoLOG) injection 0-9 Units  0-9 Units Subcutaneous TID WC Calvert Cantor, MD   1 Units at 01/20/20 0830  . ipratropium-albuterol (DUONEB) 0.5-2.5 (3) MG/3ML nebulizer solution 3 mL  3 mL Nebulization Q6H PRN Eugenie Norrie, NP      . ipratropium-albuterol (DUONEB) 0.5-2.5 (3) MG/3ML nebulizer solution 3 mL  3 mL Nebulization Q6H Salena Saner, MD   3 mL at 01/20/20 0747  . lactated ringers bolus 500 mL  500 mL Intravenous Once Erin Fulling, MD      . latanoprost (XALATAN) 0.005 % ophthalmic solution 1 drop  1 drop Both Eyes QHS Eugenie Norrie, NP   1 drop at 01/19/20 2146  . metoprolol tartrate (LOPRESSOR) injection 2.5 mg  2.5 mg Intravenous Q6H PRN Eugenie Norrie, NP      . mirtazapine (REMERON) tablet 30 mg  30 mg Oral QHS Rizwan, Ladell Heads, MD      . multivitamin with minerals tablet 1 tablet  1 tablet Oral Daily Salena Saner, MD   1 tablet at 01/20/20 0906  . multivitamin-lutein (OCUVITE-LUTEIN) capsule 1 capsule  1 capsule Oral Daily Calvert Cantor, MD   1 capsule at 01/20/20 0906  . ondansetron (ZOFRAN) injection 4 mg  4 mg Intravenous Q6H PRN Eugenie Norrie, NP      . polyethylene glycol (MIRALAX / GLYCOLAX) packet 17 g  17 g Oral Daily PRN Eugenie Norrie, NP      . protein supplement (ENSURE MAX) liquid  11 oz Oral BID BM Salena Saner, MD   11 oz at 01/20/20 0910  . risperiDONE (RISPERDAL) tablet 1 mg  1 mg Oral QHS Eugenie Norrie, NP   1 mg at 01/19/20 2146  . vitamin B-12 (CYANOCOBALAMIN) tablet 1,000 mcg  1,000 mcg Oral Daily Eugenie Norrie, NP   1,000 mcg at 01/20/20 3235     Discharge Medications: Please see discharge summary for a list of discharge medications.  Relevant Imaging  Results:  Relevant Lab Results:   Additional Information SSN: [id number]  Allayne Butcher, RN

## 2020-01-20 NOTE — TOC Progression Note (Signed)
Transition of Care [facility]) - Progression Note    Patient Details  Name: Jasmine Osborn MRN: [id number] Date of Birth: May 15, 1951  Transition of Care [facility]) CM/SW Contact  Allayne Butcher, RN Phone Number: 01/20/2020, 11:17 AM  Clinical Narrative:    Patient is sitting up in bed this morning, husband is at the bedside.  RNCM introduces self and role, patient and husband verbalize understanding.  Patient was admitted to the hospital from [facility] in [place] and initial plan was to return.  Patient's husband would prefer for patient to go home with home health if able to walk to the bathroom and back by herself.  If patient is not able to get up on her own then she and the husband will be agreeable to SNF.  RNCM has started bed search, Pasrr is pending.     Expected Discharge Plan: Skilled Nursing Facility Barriers to Discharge: Continued Medical Work up  Expected Discharge Plan and Services Expected Discharge Plan: Skilled Nursing Facility       Living arrangements for the past 2 months: Skilled Nursing Facility, Single Family Home                                       Social Determinants of Health (SDOH) Interventions    Readmission Risk Interventions Readmission Risk Prevention Plan 01/18/2020  Transportation Screening Complete  Medication Review Oceanographer) Complete  PCP or Specialist appointment within 3-5 days of discharge Complete  HRI or Home Care Consult Complete  Skilled Nursing Facility Complete  Some recent data might be hidden

## 2020-01-20 NOTE — Progress Notes (Signed)
Vital signs reviewed, ICU needs resolved  Will sign off at this time. No further recommendations at this time.  Please call [phone] for further questions. Thank you.    Veeda Virgo David Isaiha Asare, M.D.  [facility]  Medical Director ICU-[facility] Medical Director [facility]io-P[facility] Department   

## 2020-01-20 NOTE — Progress Notes (Signed)
Right femoral central lined removed per protocol.

## 2020-01-20 NOTE — Progress Notes (Signed)
PROGRESS NOTE    Jasmine BottomsDarlene O Osborn   ZOX:[id number]  DOB: 06/12/1951  DOA: Feb 07, 2020 PCP: [name], Marshall W, MD   Brief Narrative:  Jasmine Bottomsarlene O Rosario  69 yo female with  COPD, morbid obesity, OHS,   HTN, Abdominal Hernia, Hyperlipidemia, Chronic Atrial Fibrillation,Type II Diabetes Mellitus, Depression, vit B12 deficiency, recent Left LE ulcer & recent vaginal bleeding. Admitted 3/29-4/5 for anemia, vaginal bleeding in setting of Eliquis, D&C done on 3/31 and IUD placed. Megace discontinued.  Now presents from [facility] with SBP in 40s, temp 99,  lactic acid 5.6, WBC 24, Cr 2.47. Admitted to ICU for septic shock due to UTI and possibly pneumonia. 4/13> noted to have hematuria, Apixaban d/c'd  Subjective: She has a cough other wise no complaints.     Assessment & Plan:   Principal Problem:   Septic shock - Acute lower UTI possible pneumonia - CXR > atelectasis or infiltrates at base, has had a cough   - Vanc/Cefepime started on 4/12- transitioned to Ceftriaxone 4/15 (today) - lactic acidosis resolved - PCT 105.95 - WBC 24.8 > 10.7  - U cultue > 100K E coli- currently receiving Ceftriaxone which is it sensitive to - weaned off pressors- wean Solucortef while watching BP   Active Problems: Vaginal bleeding - has blood on the sheets but not excessive so likely is oozing- have left message for Dr Dalbert GarnetBeasley who did her D & C a couple of weeks ago.  - Apixaban has been on hold  Anemia - due to acute blood loss and possible also IVF (dilutional)  - Hb 9.9 >>> 7.9 - follow - check anemia panel in aM    Persistent atrial fibrillation  - resume Cardizem today while watching BP - Apixaban has been on hold    Morbid obesity  Body mass index is 48.59 kg/m.    AKI (acute kidney injury)- CKD 3a - Cr 2.47 up from ~ 1.2 baseline- likely due to use of diuretic and above sepsis/ hypotension/ infection - steadily improving    Obesity hypoventilation syndrome (HCC)   COPD  (chronic obstructive pulmonary disease)  - OSA? - on 3 L O2 at baseline which is what she is on currently - uses BiPAP at night    DM (diabetes mellitus), type 2  - hold Metformin - cont SSI - last A1c 5.0 on 01/19/20  Mild thrombocytopenia today - Plt 133- follow   Time spent in minutes: 45 DVT prophylaxis: SCDs Code Status: DNR Family Communication: husband Disposition Plan: uncertain at this time- was at SNF Consultants:   PCCM  Gyn Procedures:   Central line- IJ Antimicrobials:  Anti-infectives (From admission, onward)   Start     Dose/Rate Route Frequency Ordered Stop   01/20/20 1000  cefTRIAXone (ROCEPHIN) 2 g in sodium chloride 0.9 % 100 mL IVPB     2 g 200 mL/hr over 30 Minutes Intravenous Every 24 hours 01/19/20 1028     01/19/20 1000  ceFEPIme (MAXIPIME) 2 g in sodium chloride 0.9 % 100 mL IVPB  Status:  Discontinued     2 g 200 mL/hr over 30 Minutes Intravenous Every 24 hours 01/18/20 1657 01/19/20 1028   01/18/20 1000  ceFEPIme (MAXIPIME) 2 g in sodium chloride 0.9 % 100 mL IVPB  Status:  Discontinued     2 g 200 mL/hr over 30 Minutes Intravenous Every 12 hours 2020/09/13 2227 01/18/20 1657   2020/09/13 2227  vancomycin variable dose per unstable renal function (pharmacist dosing)  Status:  Discontinued      Does not apply See admin instructions 01/23/2020 2228 01/18/20 1101   01/24/2020 2045  vancomycin (VANCOCIN) IVPB 1000 mg/200 mL premix     1,000 mg 200 mL/hr over 60 Minutes Intravenous  Once 01/15/2020 2031 01/15/2020 2249   01/23/2020 2030  vancomycin (VANCOCIN) IVPB 1000 mg/200 mL premix     1,000 mg 200 mL/hr over 60 Minutes Intravenous  Once 01/18/2020 2029 01/07/2020 2249   01/16/2020 2030  ceFEPIme (MAXIPIME) 2 g in sodium chloride 0.9 % 100 mL IVPB     2 g 200 mL/hr over 30 Minutes Intravenous  Once 01/28/2020 2029 01/08/2020 2144       Objective: Vitals:   01/19/20 2300 01/20/20 0000 01/20/20 0100 01/20/20 0400  BP:  (!) 161/40  (!) 134/40  Pulse: 88 84 88 82   Resp: (!) 26 (!) 24 18 (!) 21  Temp:      TempSrc:      SpO2: 96% 99% 95% 97%  Weight:      Height:        Intake/Output Summary (Last 24 hours) at 01/20/2020 0737 Last data filed at 01/20/2020 0421 Gross per 24 hour  Intake 863.5 ml  Output 1500 ml  Net -636.5 ml   Filed Weights   01/16/2020 2323 01/18/20 0400 01/19/20 0400  Weight: 115 kg 116.1 kg 120.5 kg    Examination: General exam: Appears comfortable  HEENT: PERRLA, oral mucosa moist, no sclera icterus or thrush- mouth persistenly open with abnormal mouth movements Respiratory system: poor breath sounds, mild wheezing RR in high 20s. Cardiovascular system: S1 & S2 heard, RRR.   Gastrointestinal system: Abdomen soft, non-tender, nondistended. Normal bowel sounds. Central nervous system: Alert and oriented. No focal neurological deficits. Extremities: No cyanosis, clubbing or edema Skin: small ulcer on left foot near metatarsal head- large callus on plantar aspect of left foot Psychiatry:  Mood & affect appropriate.     Data Reviewed: I have personally reviewed following labs and imaging studies  CBC: Recent Labs  Lab 01/26/2020 1916 01/18/20 1924 01/19/20 0456 01/20/20 0630  WBC 10.6* 24.8* 20.7* 10.7*  NEUTROABS  --  21.4* 17.7* 9.9*  HGB 9.9* 8.9* 8.6* 7.9*  HCT 34.7* 31.4* 30.2* 28.1*  MCV 99.1 101.3* 99.3 100.7*  PLT 172 184 170 220*   Basic Metabolic Panel: Recent Labs  Lab 01/20/2020 1916 01/18/20 0341 01/19/20 0456 01/20/20 0630  NA 141 143 141 142  K 3.7 4.4 4.7 4.7  CL 105 106 105 108  CO2 23 26 28 27   GLUCOSE 152* 120* 220* 185*  BUN 32* 35* 47* 49*  CREATININE 2.47* 2.84* 2.49* 1.81*  CALCIUM 9.1 8.5* 8.2* 8.8*  MG  --  1.3* 2.7* 2.7*  PHOS  --  5.0* 6.4* 4.4   GFR: Estimated Creatinine Clearance: 36.3 mL/min (A) (by C-G formula based on SCr of 1.81 mg/dL (H)). Liver Function Tests: Recent Labs  Lab 01/31/2020 1916 01/18/20 0341 01/19/20 0456 01/20/20 0630  AST 39 29 16 13*  ALT 14  14 13 11   ALKPHOS 60 49 65 53  BILITOT 1.4* 1.2 0.7 0.7  PROT 6.0* 5.7* 6.1* 5.9*  ALBUMIN 3.0* 2.7* 2.8* 2.7*   Recent Labs  Lab 01/13/2020 1916  LIPASE 30   No results for input(s): AMMONIA in the last 168 hours. Coagulation Profile: Recent Labs  Lab 01/18/20 0341  INR 1.7*   Cardiac Enzymes: No results for input(s): CKTOTAL, CKMB, CKMBINDEX, TROPONINI in the last 168 hours.  BNP (last 3 results) No results for input(s): PROBNP in the last 8760 hours. HbA1C: Recent Labs    01/06/2020 2143  HGBA1C 5.0   CBG: Recent Labs  Lab 01/19/20 1122 01/19/20 1614 01/19/20 2014 01/19/20 2343 01/20/20 0414  GLUCAP 250* 210* 155* 148* 187*   Lipid Profile: No results for input(s): CHOL, HDL, LDLCALC, TRIG, CHOLHDL, LDLDIRECT in the last 72 hours. Thyroid Function Tests: No results for input(s): TSH, T4TOTAL, FREET4, T3FREE, THYROIDAB in the last 72 hours. Anemia Panel: No results for input(s): VITAMINB12, FOLATE, FERRITIN, TIBC, IRON, RETICCTPCT in the last 72 hours. Urine analysis:    Component Value Date/Time   COLORURINE YELLOW (A) 01/06/2020 2143   APPEARANCEUR TURBID (A) 01/09/2020 2143   LABSPEC 1.008 01/08/2020 2143   PHURINE 6.0 01/26/2020 2143   GLUCOSEU NEGATIVE 01/19/2020 2143   HGBUR LARGE (A) 01/31/2020 2143   BILIRUBINUR NEGATIVE 01/26/2020 2143   KETONESUR NEGATIVE 01/10/2020 2143   PROTEINUR 100 (A) 01/30/2020 2143   NITRITE NEGATIVE 01/26/2020 2143   LEUKOCYTESUR LARGE (A) 01/24/2020 2143   Sepsis Labs: @LABRCNTIP (procalcitonin:4,lacticidven:4) ) Recent Results (from the past 240 hour(s))  Blood Culture (routine x 2)     Status: None (Preliminary result)   Collection Time: 01/12/2020  7:16 PM   Specimen: BLOOD  Result Value Ref Range Status   Specimen Description BLOOD BLOOD RIGHT HAND  Final   Special Requests   Final    BOTTLES DRAWN AEROBIC AND ANAEROBIC Blood Culture adequate volume   Culture   Final    NO GROWTH 3 DAYS Performed at [facility], [address] [place], [place]    Report Status PENDING  Incomplete  Blood Culture (routine x 2)     Status: None (Preliminary result)   Collection Time: 02/03/2020  7:16 PM   Specimen: BLOOD  Result Value Ref Range Status   Specimen Description BLOOD BLOOD LEFT HAND  Final   Special Requests   Final    BOTTLES DRAWN AEROBIC AND ANAEROBIC Blood Culture results may not be optimal due to an excessive volume of blood received in culture bottles   Culture   Final    NO GROWTH 3 DAYS Performed at [facility], [address], [place], [place]    Report Status PENDING  Incomplete  SARS CORONAVIRUS 2 (TAT 6-24 HRS) Nasopharyngeal Nasopharyngeal Swab     Status: None   Collection Time: 01/24/2020  7:27 PM   Specimen: Nasopharyngeal Swab  Result Value Ref Range Status   SARS Coronavirus 2 NEGATIVE NEGATIVE Final    Comment: (NOTE) SARS-CoV-2 target nucleic acids are NOT DETECTED. The SARS-CoV-2 RNA is generally detectable in upper and lower respiratory specimens during the acute phase of infection. Negative results do not preclude SARS-CoV-2 infection, do not rule out co-infections with other pathogens, and should not be used as the sole basis for treatment or other patient management decisions. Negative results must be combined with clinical observations, patient history, and epidemiological information. The expected result is Negative. Fact Sheet for Patients: 03/18/20 Fact Sheet for Healthcare Providers: HairSlick.no This test is not yet approved or cleared by the quierodirigir.com FDA and  has been authorized for detection and/or diagnosis of SARS-CoV-2 by FDA under an Emergency Use Authorization (EUA). This EUA will remain  in effect (meaning this test can be used) for the duration of the COVID-19 declaration under Section 56 4(b)(1) of the Act, 21 U.S.C. section 360bbb-3(b)(1),  unless the authorization is terminated or  revoked sooner. Performed at [facility], [address], [place], [place] [address]   Urine Culture     Status: Abnormal (Preliminary result)   Collection Time: 01/25/20  9:43 PM   Specimen: Urine, Random  Result Value Ref Range Status   Specimen Description   Final    URINE, RANDOM Performed at [facility], [address] [place], [place] [address]    Special Requests   Final    NONE Performed at [facility], [address] [place], [place] [address]    Culture (A)  Final    >=100,000 COLONIES/mL ESCHERICHIA COLI SUSCEPTIBILITIES TO FOLLOW Performed at [facility], [address] [place], [place] [address]    Report Status PENDING  Incomplete  Respiratory Panel by RT PCR (Flu A&B, Covid) - Nasopharyngeal Swab     Status: None   Collection Time: 01/25/20 10:50 PM   Specimen: Nasopharyngeal Swab  Result Value Ref Range Status   SARS Coronavirus 2 by RT PCR NEGATIVE NEGATIVE Final    Comment: (NOTE) SARS-CoV-2 target nucleic acids are NOT DETECTED. The SARS-CoV-2 RNA is generally detectable in upper respiratoy specimens during the acute phase of infection. The lowest concentration of SARS-CoV-2 viral copies this assay can detect is 131 copies/mL. A negative result does not preclude SARS-Cov-2 infection and should not be used as the sole basis for treatment or other patient management decisions. A negative result may occur with  improper specimen collection/handling, submission of specimen other than nasopharyngeal swab, presence of viral mutation(s) within the areas targeted by this assay, and inadequate number of viral copies (<131 copies/mL). A negative result must be combined with clinical observations, patient history, and epidemiological information. The expected result is Negative. Fact Sheet for Patients:  https://www.moore.com/ Fact Sheet for Healthcare Providers:    https://www.young.biz/ This test is not yet ap proved or cleared by the [place] FDA and  has been authorized for detection and/or diagnosis of SARS-CoV-2 by FDA under an Emergency Use Authorization (EUA). This EUA will remain  in effect (meaning this test can be used) for the duration of the COVID-19 declaration under Section 564(b)(1) of the Act, 21 U.S.C. section 360bbb-3(b)(1), unless the authorization is terminated or revoked sooner.    Influenza A by PCR NEGATIVE NEGATIVE Final   Influenza B by PCR NEGATIVE NEGATIVE Final    Comment: (NOTE) The Xpert Xpress SARS-CoV-2/FLU/RSV assay is intended as an aid in  the diagnosis of influenza from Nasopharyngeal swab specimens and  should not be used as a sole basis for treatment. Nasal washings and  aspirates are unacceptable for Xpert Xpress SARS-CoV-2/FLU/RSV  testing. Fact Sheet for Patients: https://www.moore.com/ Fact Sheet for Healthcare Providers: https://www.young.biz/ This test is not yet approved or cleared by the [place] FDA and  has been authorized for detection and/or diagnosis of SARS-CoV-2 by  FDA under an Emergency Use Authorization (EUA). This EUA will remain  in effect (meaning this test can be used) for the duration of the  Covid-19 declaration under Section 564(b)(1) of the Act, 21  U.S.C. section 360bbb-3(b)(1), unless the authorization is  terminated or revoked. Performed at [facility], [address] [place], [place] [address]   MRSA PCR Screening     Status: None   Collection Time: 01/18/20  3:02 AM   Specimen: Nasopharyngeal  Result Value Ref Range Status   MRSA by PCR NEGATIVE NEGATIVE Final    Comment:        The  GeneXpert MRSA Assay (FDA approved for NASAL specimens only), is one component of a comprehensive MRSA colonization surveillance program. It is not intended to diagnose MRSA infection nor to guide or monitor  treatment for MRSA infections. Performed at [facility], [address] [place], [place] [address]   Expectorated sputum assessment w rflx to resp cult     Status: None   Collection Time: 01/18/20  1:31 PM   Specimen: Sputum  Result Value Ref Range Status   Specimen Description SPUTUM  Final   Special Requests NONE  Final   Sputum evaluation   Final    Sputum specimen not acceptable for testing.  Please recollect.   SPOKE TO Ladell Heads RN AT 1435 ON 01/18/20 [facility]  Performed at [facility], [address], [place], [place] [address]    Report Status 01/18/2020 FINAL  Final         Radiology Studies: No results found.    Scheduled Meds: . Chlorhexidine Gluconate Cloth  6 each Topical Daily  . hydrocerin  1 application Topical Daily  . hydrocortisone sod succinate (SOLU-CORTEF) inj  50 mg Intravenous Q6H  . insulin aspart  0-9 Units Subcutaneous Q4H  . ipratropium-albuterol  3 mL Nebulization Q6H  . latanoprost  1 drop Both Eyes QHS  . multivitamin with minerals  1 tablet Oral Daily  . Ensure Max Protein  11 oz Oral BID BM  . risperiDONE  1 mg Oral QHS  . vitamin B-12  1,000 mcg Oral Daily   Continuous Infusions: . sodium chloride 250 mL (01/18/20 0041)  . sodium chloride 50 mL/hr at 01/19/20 1847  . sodium chloride    . cefTRIAXone (ROCEPHIN)  IV    . lactated ringers       LOS: 3 days      Calvert Cantor, MD [facility] Pager: [email or web address] 01/20/2020, 7:37 AM

## 2020-01-20 NOTE — Progress Notes (Signed)
Physical Therapy Treatment Patient Details Name: Jasmine Osborn MRN: [id number] DOB: 04-11-1951 Today's Date: 01/20/2020    History of Present Illness Pt is a 69 y/o F with PMH: Afib, COPD, DM, morbid obesity, depression, HTN, PVD, chronic stasis edema, previous smoker. Pt previously adm to this facility d/t vaginal bleeding. Underwent D&C with heteroscopy and IUD placement on 01/05/20. On this adm, pt presented from [facility] sweaty with hypotension. Pt found to be in septic shock with UTI source.    PT Comments    Pt in bed, motivated to participate.  She is able to get to EOB with min/mod a x 1 with hand rails.  Stood with min a x 2.  When standing, it is noted that she is incontinent of urine (external cath on but did not completely manage urine) and has some vaginal bleeding noted.  RN in room to check and provide pericare.  She transfers to recliner at bedside with min guard x 2.  O2 on at 3 lpm and increased to 4 lpm with mobility.  After seated rest, she is able to progress gait into hallway 40'.  Limited by SOB and sats dropping to 82% despite increased O2.  It is noted that she does have a tendency to mouth breathe and when cued on proper breathing, sats quickly return to baseline.  Returned to 3 lpm at rest.  Pt progressing with mobility skills.  While mobility is limited at this time, she is generally steady with standing and gait.  Will continue with mobility skills in anticipation that discharge to home will be possible when medically ready for d/c.   Follow Up Recommendations  Home health PT;Supervision/Assistance - 24 hour     Equipment Recommendations  None recommended by PT    Recommendations for Other Services       Precautions / Restrictions Precautions Precautions: Fall Precaution Comments: Pt with R femoral tripple lumen IV Restrictions Weight Bearing Restrictions: No    Mobility  Bed Mobility Overal bed mobility: Needs Assistance Bed Mobility: Supine  to Sit Rolling: Mod assist;+2 for physical assistance            Transfers Overall transfer level: Needs assistance Equipment used: Rolling walker (2 wheeled) Transfers: Sit to/from Stand Sit to Stand: Min assist;+2 physical assistance            Ambulation/Gait Ambulation/Gait assistance: Min assist;+2 physical assistance;+2 safety/equipment Gait Distance (Feet): 50 Feet Assistive device: Rolling walker (2 wheeled) Gait Pattern/deviations: Step-through pattern;Decreased step length - right;Decreased step length - left;Wide base of support Gait velocity: decreased   General Gait Details: progressed gait 50' in hallway, limited by SOB and O2 sats.   Stairs             Wheelchair Mobility    Modified Rankin (Stroke Patients Only)       Balance Overall balance assessment: Needs assistance Sitting-balance support: Feet supported;Feet unsupported;No upper extremity supported Sitting balance-Leahy Scale: Good     Standing balance support: Bilateral upper extremity supported Standing balance-Leahy Scale: Good                              Cognition Arousal/Alertness: Awake/alert Behavior During Therapy: WFL for tasks assessed/performed Overall Cognitive Status: Within Functional Limits for tasks assessed  Exercises      General Comments        Pertinent Vitals/Pain Pain Assessment: No/denies pain    Home Living                      Prior Function            PT Goals (current goals can now be found in the care plan section) Progress towards PT goals: Progressing toward goals    Frequency    Min 2X/week      PT Plan Current plan remains appropriate    Co-evaluation              AM-PAC PT "6 Clicks" Mobility   Outcome Measure  Help needed turning from your back to your side while in a flat bed without using bedrails?: A Little Help needed moving from  lying on your back to sitting on the side of a flat bed without using bedrails?: A Little Help needed moving to and from a bed to a chair (including a wheelchair)?: A Little Help needed standing up from a chair using your arms (e.g., wheelchair or bedside chair)?: A Little Help needed to walk in hospital room?: A Little Help needed climbing 3-5 steps with a railing? : A Lot 6 Click Score: 17    End of Session Equipment Utilized During Treatment: Gait belt;Oxygen Activity Tolerance: Patient limited by fatigue Patient left: in chair;with call bell/phone within reach;with chair alarm set;with nursing/sitter in room Nurse Communication: Mobility status;Other (comment)       Time: 0160-1093 PT Time Calculation (min) (ACUTE ONLY): 25 min  Charges:  $Gait Training: 8-22 mins $Therapeutic Activity: 8-22 mins                    Chesley Noon, PTA 01/20/20, 12:26 PM

## 2020-01-21 DIAGNOSIS — A419 Sepsis, unspecified organism: Secondary | ICD-10-CM | POA: Diagnosis not present

## 2020-01-21 DIAGNOSIS — Z7189 Other specified counseling: Secondary | ICD-10-CM | POA: Diagnosis not present

## 2020-01-21 DIAGNOSIS — N390 Urinary tract infection, site not specified: Secondary | ICD-10-CM | POA: Diagnosis not present

## 2020-01-21 DIAGNOSIS — E1169 Type 2 diabetes mellitus with other specified complication: Secondary | ICD-10-CM | POA: Diagnosis not present

## 2020-01-21 DIAGNOSIS — Z515 Encounter for palliative care: Secondary | ICD-10-CM | POA: Diagnosis not present

## 2020-01-21 DIAGNOSIS — J449 Chronic obstructive pulmonary disease, unspecified: Secondary | ICD-10-CM | POA: Diagnosis not present

## 2020-01-21 LAB — PHOSPHORUS: Phosphorus: 2.6 mg/dL (ref 2.5–4.6)

## 2020-01-21 LAB — CBC WITH DIFFERENTIAL/PLATELET
Abs Immature Granulocytes: 0.3 10*3/uL — ABNORMAL HIGH (ref 0.00–0.07)
Basophils Absolute: 0 10*3/uL (ref 0.0–0.1)
Basophils Relative: 0 %
Eosinophils Absolute: 0.1 10*3/uL (ref 0.0–0.5)
Eosinophils Relative: 1 %
HCT: 28.4 % — ABNORMAL LOW (ref 36.0–46.0)
Hemoglobin: 8.2 g/dL — ABNORMAL LOW (ref 12.0–15.0)
Immature Granulocytes: 4 %
Lymphocytes Relative: 12 %
Lymphs Abs: 0.9 10*3/uL (ref 0.7–4.0)
MCH: 28.3 pg (ref 26.0–34.0)
MCHC: 28.9 g/dL — ABNORMAL LOW (ref 30.0–36.0)
MCV: 97.9 fL (ref 80.0–100.0)
Monocytes Absolute: 0.4 10*3/uL (ref 0.1–1.0)
Monocytes Relative: 5 %
Neutro Abs: 6.3 10*3/uL (ref 1.7–7.7)
Neutrophils Relative %: 78 %
Platelets: 120 10*3/uL — ABNORMAL LOW (ref 150–400)
RBC: 2.9 MIL/uL — ABNORMAL LOW (ref 3.87–5.11)
RDW: 18.2 % — ABNORMAL HIGH (ref 11.5–15.5)
WBC: 8 10*3/uL (ref 4.0–10.5)
nRBC: 0 % (ref 0.0–0.2)

## 2020-01-21 LAB — MAGNESIUM: Magnesium: 2.6 mg/dL — ABNORMAL HIGH (ref 1.7–2.4)

## 2020-01-21 LAB — COMPREHENSIVE METABOLIC PANEL
ALT: 10 U/L (ref 0–44)
AST: 10 U/L — ABNORMAL LOW (ref 15–41)
Albumin: 2.7 g/dL — ABNORMAL LOW (ref 3.5–5.0)
Alkaline Phosphatase: 51 U/L (ref 38–126)
Anion gap: 7 (ref 5–15)
BUN: 45 mg/dL — ABNORMAL HIGH (ref 8–23)
CO2: 30 mmol/L (ref 22–32)
Calcium: 9.1 mg/dL (ref 8.9–10.3)
Chloride: 109 mmol/L (ref 98–111)
Creatinine, Ser: 1.33 mg/dL — ABNORMAL HIGH (ref 0.44–1.00)
GFR calc Af Amer: 47 mL/min — ABNORMAL LOW (ref >60)
GFR calc non Af Amer: 41 mL/min — ABNORMAL LOW (ref >60)
Glucose, Bld: 109 mg/dL — ABNORMAL HIGH (ref 70–99)
Potassium: 4.1 mmol/L (ref 3.5–5.1)
Sodium: 146 mmol/L — ABNORMAL HIGH (ref 135–145)
Total Bilirubin: 0.7 mg/dL (ref 0.3–1.2)
Total Protein: 5.8 g/dL — ABNORMAL LOW (ref 6.5–8.1)

## 2020-01-21 LAB — GLUCOSE, CAPILLARY
Glucose-Capillary: 104 mg/dL — ABNORMAL HIGH (ref 70–99)
Glucose-Capillary: 190 mg/dL — ABNORMAL HIGH (ref 70–99)
Glucose-Capillary: 90 mg/dL (ref 70–99)

## 2020-01-21 LAB — IRON AND TIBC
Iron: 38 ug/dL (ref 28–170)
Saturation Ratios: 11 % (ref 10.4–31.8)
TIBC: 333 ug/dL (ref 250–450)
UIBC: 295 ug/dL

## 2020-01-21 LAB — VITAMIN B12: Vitamin B-12: 1037 pg/mL — ABNORMAL HIGH (ref 180–914)

## 2020-01-21 LAB — RETICULOCYTES
Immature Retic Fract: 34.8 % — ABNORMAL HIGH (ref 2.3–15.9)
RBC.: 2.85 MIL/uL — ABNORMAL LOW (ref 3.87–5.11)
Retic Count, Absolute: 96.3 10*3/uL (ref 19.0–186.0)
Retic Ct Pct: 3.4 % — ABNORMAL HIGH (ref 0.4–3.1)

## 2020-01-21 LAB — FOLATE: Folate: 17.3 ng/mL (ref >5.9)

## 2020-01-21 LAB — FERRITIN: Ferritin: 73 ng/mL (ref 11–307)

## 2020-01-21 MED ORDER — GLYCOPYRROLATE 0.2 MG/ML IJ SOLN
0.4000 mg | INTRAMUSCULAR | Status: DC | PRN
  Administered 2020-01-22 (×2): 0.4 mg via INTRAVENOUS
  Filled 2020-01-21 (×2): qty 2

## 2020-01-21 MED ORDER — SODIUM CHLORIDE 0.9% FLUSH
3.0000 mL | Freq: Two times a day (BID) | INTRAVENOUS | Status: DC
  Administered 2020-01-21 – 2020-01-22 (×3): 3 mL via INTRAVENOUS

## 2020-01-21 MED ORDER — HALOPERIDOL 0.5 MG PO TABS
0.5000 mg | ORAL_TABLET | ORAL | Status: DC | PRN
  Filled 2020-01-21: qty 1

## 2020-01-21 MED ORDER — GLYCOPYRROLATE 1 MG PO TABS
2.0000 mg | ORAL_TABLET | ORAL | Status: DC | PRN
  Filled 2020-01-21: qty 2

## 2020-01-21 MED ORDER — LORAZEPAM 1 MG PO TABS: 1.0000 mg | ORAL_TABLET | ORAL | Status: DC | PRN

## 2020-01-21 MED ORDER — TRANEXAMIC ACID 650 MG PO TABS
1300.0000 mg | ORAL_TABLET | Freq: Three times a day (TID) | ORAL | Status: DC
  Administered 2020-01-21: 1300 mg via ORAL
  Filled 2020-01-21 (×5): qty 2

## 2020-01-21 MED ORDER — ACETAMINOPHEN 325 MG PO TABS: 650.0000 mg | ORAL_TABLET | Freq: Four times a day (QID) | ORAL | Status: DC | PRN

## 2020-01-21 MED ORDER — LORAZEPAM 2 MG/ML IJ SOLN
0.2500 mg | INTRAMUSCULAR | Status: DC | PRN
  Administered 2020-01-21 – 2020-01-22 (×2): 0.5 mg via INTRAVENOUS
  Filled 2020-01-21 (×2): qty 1

## 2020-01-21 MED ORDER — POLYVINYL ALCOHOL 1.4 % OP SOLN
1.0000 [drp] | Freq: Four times a day (QID) | OPHTHALMIC | Status: DC | PRN
  Filled 2020-01-21: qty 15

## 2020-01-21 MED ORDER — MORPHINE SULFATE (CONCENTRATE) 10 MG/0.5ML PO SOLN
5.0000 mg | ORAL | Status: DC | PRN
  Administered 2020-01-21: 18:00:00 10 mg via ORAL
  Filled 2020-01-21: qty 0.5

## 2020-01-21 MED ORDER — LORAZEPAM 2 MG/ML PO CONC
0.5000 mg | ORAL | Status: DC | PRN
  Filled 2020-01-21: qty 0.5

## 2020-01-21 MED ORDER — TORSEMIDE 20 MG PO TABS
20.0000 mg | ORAL_TABLET | Freq: Every day | ORAL | Status: DC
  Administered 2020-01-21: 20 mg via ORAL
  Filled 2020-01-21: qty 1

## 2020-01-21 MED ORDER — HALOPERIDOL LACTATE 2 MG/ML PO CONC
0.5000 mg | ORAL | Status: DC | PRN
  Filled 2020-01-21: qty 0.3

## 2020-01-21 MED ORDER — SODIUM CHLORIDE 0.9 % IV SOLN: 250.0000 mL | INTRAVENOUS | Status: DC | PRN

## 2020-01-21 MED ORDER — HALOPERIDOL LACTATE 5 MG/ML IJ SOLN: 0.5000 mg | INTRAMUSCULAR | Status: DC | PRN

## 2020-01-21 MED ORDER — BIOTENE DRY MOUTH MT LIQD: 15.0000 mL | OROMUCOSAL | Status: DC | PRN

## 2020-01-21 MED ORDER — ENSURE MAX PROTEIN PO LIQD
11.0000 [oz_av] | Freq: Three times a day (TID) | ORAL | Status: DC
  Administered 2020-01-21: 237 mL via ORAL
  Filled 2020-01-21: qty 330

## 2020-01-21 MED ORDER — ONDANSETRON 4 MG PO TBDP
4.0000 mg | ORAL_TABLET | Freq: Four times a day (QID) | ORAL | Status: DC | PRN
  Filled 2020-01-21: qty 1

## 2020-01-21 MED ORDER — ONDANSETRON HCL 4 MG/2ML IJ SOLN: 4.0000 mg | Freq: Four times a day (QID) | INTRAMUSCULAR | Status: DC | PRN

## 2020-01-21 MED ORDER — METOPROLOL SUCCINATE ER 50 MG PO TB24
50.0000 mg | ORAL_TABLET | Freq: Every day | ORAL | Status: DC
  Administered 2020-01-21: 12:00:00 50 mg via ORAL
  Filled 2020-01-21: qty 1

## 2020-01-21 MED ORDER — GLYCOPYRROLATE 0.2 MG/ML IJ SOLN: 0.4000 mg | INTRAMUSCULAR | Status: DC | PRN

## 2020-01-21 MED ORDER — MORPHINE SULFATE (PF) 2 MG/ML IV SOLN
1.0000 mg | INTRAVENOUS | Status: DC | PRN
  Administered 2020-01-21 – 2020-01-22 (×4): 2 mg via INTRAVENOUS
  Filled 2020-01-21 (×5): qty 1

## 2020-01-21 MED ORDER — SODIUM CHLORIDE 0.9% FLUSH: 3.0000 mL | INTRAVENOUS | Status: DC | PRN

## 2020-01-21 MED ORDER — ACETAMINOPHEN 650 MG RE SUPP: 650.0000 mg | Freq: Four times a day (QID) | RECTAL | Status: DC | PRN

## 2020-01-21 NOTE — Progress Notes (Addendum)
PROGRESS NOTE    Jasmine Osborn   GYB:[id number]  DOB: 04/30/1951  DOA: 07-Feb-2020 PCP: Lauro Regulus, MD   Brief Narrative:  Jasmine Osborn  69 yo female with  COPD, morbid obesity, OHS,   HTN, Abdominal Hernia, Hyperlipidemia, Chronic Atrial Fibrillation,Type II Diabetes Mellitus, Depression, vit B12 deficiency, recent Left LE ulcer & recent vaginal bleeding. Admitted 3/29-4/5 for anemia, vaginal bleeding in setting of Eliquis, D&C done on 3/31 and IUD placed. Megace discontinued.  Now presents from SNF ([facility]) with SBP in 40s, temp 99,  lactic acid 5.6, WBC 24, Cr 2.47. Admitted to ICU for septic shock due to UTI and possibly pneumonia. 4/13> noted to have hematuria, Apixaban d/c'd  Of note, the patient is a poor historian.  Subjective: She has no complaints today. Yesterday evening, RN noted increased respiratory distress which resolved after the BiPAP was placed.     Assessment & Plan:   Principal Problem:   Septic shock - Acute lower UTI possible pneumonia - CXR > atelectasis or infiltrates at base, has had a cough which is improving per husband   - Vanc/Cefepime started on 4/12- transitioned to Ceftriaxone 4/15 - 4/18 would complete a 7 day course which I am expecting - lactic acidosis resolved - PCT 105.95 - WBC 24.8 > 10.7  - U cultue > 100K E coli- currently receiving Ceftriaxone which is it sensitive to - weaned off pressors- dc/ Solucortef today   Active Problems: Vaginal bleeding - has blood on the sheets but not excessive so likely is oozing - 4/15>  have left message for Dr Dalbert Garnet who did her D & C a couple of weeks ago that she is bleeding and I would like a consult- no consult as of yet - Apixaban has been on hold - 4/16- less blood on sheets today- per RN, it seems to be like mild menses bleeding - patient unaware of the bleeding  Anemia -  possibly dilutional with underlying anemia of chronic disease - Hb 9.9 >>> 7.9 - 8.2  -  anemia  panel      Ref. Range 01/21/2020 05:09  Iron Latest Ref Range: 28 - 170 ug/dL 38  UIBC Latest Units: ug/dL 876  TIBC Latest Ref Range: 250 - 450 ug/dL 811  Saturation Ratios Latest Ref Range: 10.4 - 31.8 % 11  Ferritin Latest Ref Range: 11 - 307 ng/mL 73  Folate Latest Ref Range: >5.9 ng/mL 17.3      Persistent atrial fibrillation  - 4/16 resumed Cardizem while watching BP - resume Toprol 50 mg today - Apixaban has been on hold due to the vaginal bleeding    Morbid obesity  Body mass index is 48.35 kg/m.    AKI (acute kidney injury)- CKD 3a - Cr 2.47 up from ~ 1.2 baseline- likely due to use of diuretic and above sepsis/ hypotension/ infection - now 1.33 - resume Demadex 20 mg today    Obesity hypoventilation syndrome (HCC)   COPD (chronic obstructive pulmonary disease)  - OSA? - on 3 L O2 at baseline which is what she is on currently - uses BiPAP at night    DM (diabetes mellitus), type 2  - hold Metformin - cont SSI - last A1c 5.0 on 01/19/20  Mild thrombocytopenia  - Plt 133> 120 - follow  Disposition: patient considering hospice/ comfort care- meeting with family today  Time spent in minutes: 35 DVT prophylaxis: SCDs Code Status: DNR Family Communication: husband Disposition Plan: uncertain at this  time- was at SNF- f/u palliative care conversation today to discuss comfort care  - watching bleeding and respiratory rate today (had some respiratory distress last night) Consultants:   PCCM  Gyn Procedures:   Central line- IJ Antimicrobials:  Anti-infectives (From admission, onward)   Start     Dose/Rate Route Frequency Ordered Stop   01/20/20 1000  cefTRIAXone (ROCEPHIN) 2 g in sodium chloride 0.9 % 100 mL IVPB  Status:  Discontinued     2 g 200 mL/hr over 30 Minutes Intravenous Every 24 hours 01/19/20 1028 01/21/20 1126   01/19/20 1000  ceFEPIme (MAXIPIME) 2 g in sodium chloride 0.9 % 100 mL IVPB  Status:  Discontinued     2 g 200 mL/hr over 30 Minutes  Intravenous Every 24 hours 01/18/20 1657 01/19/20 1028   01/18/20 1000  ceFEPIme (MAXIPIME) 2 g in sodium chloride 0.9 % 100 mL IVPB  Status:  Discontinued     2 g 200 mL/hr over 30 Minutes Intravenous Every 12 hours Jan 31, 2020 2227 01/18/20 1657   01/31/20 2227  vancomycin variable dose per unstable renal function (pharmacist dosing)  Status:  Discontinued      Does not apply See admin instructions 31-Jan-2020 2228 01/18/20 1101   31-Jan-2020 2045  vancomycin (VANCOCIN) IVPB 1000 mg/200 mL premix     1,000 mg 200 mL/hr over 60 Minutes Intravenous  Once January 31, 2020 2031 2020/01/31 2249   January 31, 2020 2030  vancomycin (VANCOCIN) IVPB 1000 mg/200 mL premix     1,000 mg 200 mL/hr over 60 Minutes Intravenous  Once 01-31-20 2029 31-Jan-2020 2249   2020-01-31 2030  ceFEPIme (MAXIPIME) 2 g in sodium chloride 0.9 % 100 mL IVPB     2 g 200 mL/hr over 30 Minutes Intravenous  Once January 31, 2020 2029 01-31-20 2144       Objective: Vitals:   01/21/20 0749 01/21/20 0800 01/21/20 0900 01/21/20 1000  BP:  (!) 162/55    Pulse: 87 98 96 92  Resp:  (!) 26 (!) 24 (!) 22  Temp:  98.4 F (36.9 C)    TempSrc:  Axillary    SpO2: 99%  95% 98%  Weight:      Height:        Intake/Output Summary (Last 24 hours) at 01/21/2020 1142 Last data filed at 01/21/2020 0710 Gross per 24 hour  Intake 820 ml  Output 750 ml  Net 70 ml   Filed Weights   01/18/20 0400 01/19/20 0400 01/21/20 0400  Weight: 116.1 kg 120.5 kg 119.9 kg    Examination: General exam: Appears comfortable  HEENT: PERRLA, oral mucosa moist, no sclera icterus or thrush Respiratory system: b/l rhonchi- RR in high 20s- low 30s- mouth breathing Cardiovascular system: S1 & S2 heard,  No murmurs  Gastrointestinal system: Abdomen soft, non-tender, nondistended. Normal bowel sounds   Central nervous system: Alert and oriented. No focal neurological deficits. Extremities: No cyanosis, clubbing - mild edema in extermities Skin: No rashes or ulcers Psychiatry:  Mood &  affect appropriate.     Data Reviewed: I have personally reviewed following labs and imaging studies  CBC: Recent Labs  Lab 01/31/20 1916 01/18/20 1924 01/19/20 0456 01/20/20 0630 01/21/20 0509  WBC 10.6* 24.8* 20.7* 10.7* 8.0  NEUTROABS  --  21.4* 17.7* 9.9* 6.3  HGB 9.9* 8.9* 8.6* 7.9* 8.2*  HCT 34.7* 31.4* 30.2* 28.1* 28.4*  MCV 99.1 101.3* 99.3 100.7* 97.9  PLT 172 184 170 133* 161*   Basic Metabolic Panel: Recent Labs  Lab 01/31/20  1916 01/18/20 0341 01/19/20 0456 01/20/20 0630 01/21/20 0509  NA 141 143 141 142 146*  K 3.7 4.4 4.7 4.7 4.1  CL 105 106 105 108 109  CO2 GLUCOSE 152* 120* 220* 185* 109*  BUN 32* 35* 47* 49* 45*  CREATININE 2.47* 2.84* 2.49* 1.81* 1.33*  CALCIUM 9.1 8.5* 8.2* 8.8* 9.1  MG  --  1.3* 2.7* 2.7* 2.6*  PHOS  --  5.0* 6.4* 4.4 2.6   GFR: Estimated Creatinine Clearance: 49.2 mL/min (A) (by C-G formula based on SCr of 1.33 mg/dL (H)). Liver Function Tests: Recent Labs  Lab 2020-01-28 1916 01/18/20 0341 01/19/20 0456 01/20/20 0630 01/21/20 0509  AST 39 29 16 13* 10*  ALT ALKPHOS 60 49 65 53 51  BILITOT 1.4* 1.2 0.7 0.7 0.7  PROT 6.0* 5.7* 6.1* 5.9* 5.8*  ALBUMIN 3.0* 2.7* 2.8* 2.7* 2.7*   Recent Labs  Lab 2020/01/28 1916  LIPASE 30   No results for input(s): AMMONIA in the last 168 hours. Coagulation Profile: Recent Labs  Lab 01/18/20 0341  INR 1.7*   Cardiac Enzymes: No results for input(s): CKTOTAL, CKMB, CKMBINDEX, TROPONINI in the last 168 hours. BNP (last 3 results) No results for input(s): PROBNP in the last 8760 hours. HbA1C: No results for input(s): HGBA1C in the last 72 hours. CBG: Recent Labs  Lab 01/20/20 1629 01/20/20 2013 01/21/20 0027 01/21/20 0353 01/21/20 0808  GLUCAP 183* 207* 190* 104* 90   Lipid Profile: No results for input(s): CHOL, HDL, LDLCALC, TRIG, CHOLHDL, LDLDIRECT in the last 72 hours. Thyroid Function Tests: No results for input(s): TSH, T4TOTAL,  FREET4, T3FREE, THYROIDAB in the last 72 hours. Anemia Panel: Recent Labs    01/21/20 0509  FOLATE 17.3  FERRITIN 73  TIBC 333  IRON 38  RETICCTPCT 3.4*   Urine analysis:    Component Value Date/Time   COLORURINE YELLOW (A) 01/28/20 2143   APPEARANCEUR TURBID (A) Jan 28, 2020 2143   LABSPEC 1.008 2020-01-28 2143   PHURINE 6.0 2020/01/28 2143   GLUCOSEU NEGATIVE 2020-01-28 2143   HGBUR LARGE (A) January 28, 2020 2143   BILIRUBINUR NEGATIVE 2020/01/28 2143   KETONESUR NEGATIVE 01-28-20 2143   PROTEINUR 100 (A) January 28, 2020 2143   NITRITE NEGATIVE 2020/01/28 2143   LEUKOCYTESUR LARGE (A) Jan 28, 2020 2143   Sepsis Labs: (procalcitonin:4,lacticidven:4) ) Recent Results (from the past 240 hour(s))  Blood Culture (routine x 2)     Status: None (Preliminary result)   Collection Time: Jan 28, 2020  7:16 PM   Specimen: BLOOD  Result Value Ref Range Status   Specimen Description BLOOD BLOOD RIGHT HAND  Final   Special Requests   Final    BOTTLES DRAWN AEROBIC AND ANAEROBIC Blood Culture adequate volume   Culture   Final    NO GROWTH 4 DAYS Performed at [facility], [address], [place], [place] [address]    Report Status PENDING  Incomplete  Blood Culture (routine x 2)     Status: None (Preliminary result)   Collection Time: 01-28-20  7:16 PM   Specimen: BLOOD  Result Value Ref Range Status   Specimen Description BLOOD BLOOD LEFT HAND  Final   Special Requests   Final    BOTTLES DRAWN AEROBIC AND ANAEROBIC Blood Culture results may not be optimal due to an excessive volume of blood received in culture bottles   Culture   Final    NO GROWTH 4 DAYS Performed at [facility], [address] [place], [place] [address]    Report Status PENDING  Incomplete  SARS CORONAVIRUS 2 (TAT 6-24 HRS) Nasopharyngeal Nasopharyngeal Swab     Status: None   Collection Time: 02/04/2020  7:27 PM   Specimen: Nasopharyngeal Swab  Result Value Ref Range Status   SARS  Coronavirus 2 NEGATIVE NEGATIVE Final    Comment: (NOTE) SARS-CoV-2 target nucleic acids are NOT DETECTED. The SARS-CoV-2 RNA is generally detectable in upper and lower respiratory specimens during the acute phase of infection. Negative results do not preclude SARS-CoV-2 infection, do not rule out co-infections with other pathogens, and should not be used as the sole basis for treatment or other patient management decisions. Negative results must be combined with clinical observations, patient history, and epidemiological information. The expected result is Negative. Fact Sheet for Patients: HairSlick.no Fact Sheet for Healthcare Providers: quierodirigir.com This test is not yet approved or cleared by the [place] FDA and  has been authorized for detection and/or diagnosis of SARS-CoV-2 by FDA under an Emergency Use Authorization (EUA). This EUA will remain  in effect (meaning this test can be used) for the duration of the COVID-19 declaration under Section 56 4(b)(1) of the Act, 21 U.S.C. section 360bbb-3(b)(1), unless the authorization is terminated or revoked sooner. Performed at [facility], [address], [place], [place] [address]   Urine Culture     Status: Abnormal   Collection Time: 01/20/2020  9:43 PM   Specimen: Urine, Random  Result Value Ref Range Status   Specimen Description   Final    URINE, RANDOM Performed at [facility], [address] [place], [place] [address]    Special Requests   Final    NONE Performed at [facility], [address] [place], [place] [address]    Culture >=100,000 COLONIES/mL ESCHERICHIA COLI (A)  Final   Report Status 01/20/2020 FINAL  Final   Organism ID, Bacteria ESCHERICHIA COLI (A)  Final      Susceptibility   Escherichia coli - MIC*    AMPICILLIN <=2 SENSITIVE Sensitive     CEFAZOLIN <=4 SENSITIVE Sensitive     CEFTRIAXONE <=0.25 SENSITIVE  Sensitive     CIPROFLOXACIN >=4 RESISTANT Resistant     GENTAMICIN <=1 SENSITIVE Sensitive     IMIPENEM <=0.25 SENSITIVE Sensitive     NITROFURANTOIN <=16 SENSITIVE Sensitive     TRIMETH/SULFA <=20 SENSITIVE Sensitive     AMPICILLIN/SULBACTAM <=2 SENSITIVE Sensitive     PIP/TAZO <=4 SENSITIVE Sensitive     * >=100,000 COLONIES/mL ESCHERICHIA COLI  Respiratory Panel by RT PCR (Flu A&B, Covid) - Nasopharyngeal Swab     Status: None   Collection Time: 01/15/2020 10:50 PM   Specimen: Nasopharyngeal Swab  Result Value Ref Range Status   SARS Coronavirus 2 by RT PCR NEGATIVE NEGATIVE Final    Comment: (NOTE) SARS-CoV-2 target nucleic acids are NOT DETECTED. The SARS-CoV-2 RNA is generally detectable in upper respiratoy specimens during the acute phase of infection. The lowest concentration of SARS-CoV-2 viral copies this assay can detect is 131 copies/mL. A negative result does not preclude SARS-Cov-2 infection and should not be used as the sole basis for treatment or other patient management decisions. A negative result may occur with  improper specimen collection/handling, submission of specimen other than nasopharyngeal swab, presence of viral mutation(s) within the areas targeted by this assay, and inadequate number of viral copies (<131 copies/mL). A negative result must be combined with clinical observations, patient  history, and epidemiological information. The expected result is Negative. Fact Sheet for Patients:  https://www.moore.com/ Fact Sheet for Healthcare Providers:  https://www.young.biz/ This test is not yet ap proved or cleared by the [place] FDA and  has been authorized for detection and/or diagnosis of SARS-CoV-2 by FDA under an Emergency Use Authorization (EUA). This EUA will remain  in effect (meaning this test can be used) for the duration of the COVID-19 declaration under Section 564(b)(1) of the Act, 21 U.S.C. section  360bbb-3(b)(1), unless the authorization is terminated or revoked sooner.    Influenza A by PCR NEGATIVE NEGATIVE Final   Influenza B by PCR NEGATIVE NEGATIVE Final    Comment: (NOTE) The Xpert Xpress SARS-CoV-2/FLU/RSV assay is intended as an aid in  the diagnosis of influenza from Nasopharyngeal swab specimens and  should not be used as a sole basis for treatment. Nasal washings and  aspirates are unacceptable for Xpert Xpress SARS-CoV-2/FLU/RSV  testing. Fact Sheet for Patients: https://www.moore.com/ Fact Sheet for Healthcare Providers: https://www.young.biz/ This test is not yet approved or cleared by the [place] FDA and  has been authorized for detection and/or diagnosis of SARS-CoV-2 by  FDA under an Emergency Use Authorization (EUA). This EUA will remain  in effect (meaning this test can be used) for the duration of the  Covid-19 declaration under Section 564(b)(1) of the Act, 21  U.S.C. section 360bbb-3(b)(1), unless the authorization is  terminated or revoked. Performed at [facility], [address] [place], [place] [address]   MRSA PCR Screening     Status: None   Collection Time: 01/18/20  3:02 AM   Specimen: Nasopharyngeal  Result Value Ref Range Status   MRSA by PCR NEGATIVE NEGATIVE Final    Comment:        The GeneXpert MRSA Assay (FDA approved for NASAL specimens only), is one component of a comprehensive MRSA colonization surveillance program. It is not intended to diagnose MRSA infection nor to guide or monitor treatment for MRSA infections. Performed at [facility], [address] [place], [place] [address]   Expectorated sputum assessment w rflx to resp cult     Status: None   Collection Time: 01/18/20  1:31 PM   Specimen: Sputum  Result Value Ref Range Status   Specimen Description SPUTUM  Final   Special Requests NONE  Final   Sputum evaluation   Final    Sputum specimen not  acceptable for testing.  Please recollect.   SPOKE TO Ladell Heads RN AT 1435 ON 01/18/20 [facility]  Performed at [facility], [address], [place], [place] [address]    Report Status 01/18/2020 FINAL  Final         Radiology Studies: No results found.    Scheduled Meds: . Chlorhexidine Gluconate Cloth  6 each Topical Daily  . diltiazem  120 mg Oral Daily  . hydrocerin  1 application Topical Daily  . hydrocortisone sod succinate (SOLU-CORTEF) inj  50 mg Intravenous Q12H  . ipratropium-albuterol  3 mL Nebulization Q6H  . latanoprost  1 drop Both Eyes QHS  . mirtazapine  30 mg Oral QHS  . multivitamin-lutein  1 capsule Oral Daily  . Ensure Max Protein  11 oz Oral BID BM  . risperiDONE  1 mg Oral QHS  . sodium chloride flush  3 mL Intravenous Q12H   Continuous Infusions: . sodium chloride    . lactated ringers       LOS: 4 days  Calvert CantorSaima Keilen Kahl, MD [facility] Pager: [email or web address] 01/21/2020, 11:42 AM

## 2020-01-21 NOTE — Consult Note (Signed)
Consultation Note Date: 01/21/2020   Patient Name: Jasmine Osborn  DOB: [birth year]  MRN: [id number]  Age / Sex: 69 y.o., female  PCP: Kirk Ruths, MD Referring Physician: Debbe Odea, MD  Reason for Consultation: Establishing goals of care  HPI/Patient Profile: Jasmine Osborn is a 69 y.o. female extensive recent medical history, A. fib, uterine bleeding, obesity hypoventilation syndrome acute renal failure Patient presents today for concerns of hypotension.  Patient was evidently very sweaty pale and weak at [facility].  This prompted EMS call where the patient was found to be severely hypotensive but fluid responsive.  Clinical Assessment and Goals of Care: Patient is resting in bed. She appears uncomfortable and has work of breathing. She states this is normal for her. She lives at home with her husband. She has a daughter.  Functionally, she states she walks around the house without assistive devices, and does not walk around outside. She uses O2 at baseline. She states she spends her days watching t.v.   We discussed her diagnosis and the progressive nature of COPD, prognosis, GOC, EOL wishes disposition and options.  A detailed discussion was had today regarding advanced directives.  Concepts specific to code status, artifical feeding and hydration, IV antibiotics and rehospitalization were discussed.  The difference between an aggressive medical intervention path and a comfort care path was discussed.  Values and goals of care important to patient and family were attempted to be elicited.  Discussed limitations of medical interventions to prolong quality of life in some situations and discussed the concept of human mortality.  She confirms DNR/DNI status. She states her current QOL is not acceptable. She states she has felt this way for months prior to the most recent admission  for vaginal bleeding and hysterectomy. Jasmine Osborn discusses family events such as funerals she is unable to attend due to SOB. She states she would like to shift to comfort care and transition to hospice care. Discussed the need for a meeting with her husband to discuss her wishes and planning.   She called her husband and then I spoke with husband on her bedside phone to arrange a meeting for tomorrow.    SUMMARY OF RECOMMENDATIONS   Leaning toward comfort care. Family meeting tomorrow morning.   Prognosis:   Poor      Primary Diagnoses: Present on Admission: . Septic shock ([place]) . Persistent atrial fibrillation ([place]) . Morbid obesity ([place])   I have reviewed the medical record, interviewed the patient and family, and examined the patient. The following aspects are pertinent.  Past Medical History:  Diagnosis Date  . Atrial fibrillation ([place])   . Chronic respiratory failure with hypoxia and hypercapnia (HCC)   . Depression   . Diabetes mellitus without complication ([place])   . Elevated lipids   . Hernia of abdominal wall   . Hypertension   . Increased endometrial stripe thickness 06/04/2017  . Increased endometrial stripe thickness 06/04/2017  . Obesity hypoventilation syndrome ([place])   . Psychosis ([place])   .  PVD (peripheral vascular disease) (HCC)    heel ulcer Lt    Social History   Socioeconomic History  . Marital status: Married    Spouse name: Not on file  . Number of children: Not on file  . Years of education: Not on file  . Highest education level: Not on file  Occupational History  . Not on file  Tobacco Use  . Smoking status: Former Games developer  . Smokeless tobacco: Never Used  Substance and Sexual Activity  . Alcohol use: No  . Drug use: No  . Sexual activity: Not on file  Other Topics Concern  . Not on file  Social History Narrative  . Not on file   Social Determinants of Health   Financial Resource Strain:   . Difficulty of Paying Living  Expenses:   Food Insecurity:   . Worried About Programme researcher, broadcasting/film/video in the Last Year:   . Barista in the Last Year:   Transportation Needs:   . Freight forwarder (Medical):   Marland Kitchen Lack of Transportation (Non-Medical):   Physical Activity:   . Days of Exercise per Week:   . Minutes of Exercise per Session:   Stress:   . Feeling of Stress :   Social Connections:   . Frequency of Communication with Friends and Family:   . Frequency of Social Gatherings with Friends and Family:   . Attends Religious Services:   . Active Member of Clubs or Organizations:   . Attends Banker Meetings:   Marland Kitchen Marital Status:    Family History  Problem Relation Age of Onset  . Dementia Mother   . Valvular heart disease Father    Scheduled Meds: . Chlorhexidine Gluconate Cloth  6 each Topical Daily  . diltiazem  120 mg Oral Daily  . hydrocerin  1 application Topical Daily  . hydrocortisone sod succinate (SOLU-CORTEF) inj  50 mg Intravenous Q12H  . insulin aspart  0-9 Units Subcutaneous TID WC  . ipratropium-albuterol  3 mL Nebulization Q6H  . latanoprost  1 drop Both Eyes QHS  . mirtazapine  30 mg Oral QHS  . multivitamin with minerals  1 tablet Oral Daily  . multivitamin-lutein  1 capsule Oral Daily  . Ensure Max Protein  11 oz Oral BID BM  . risperiDONE  1 mg Oral QHS  . vitamin B-12  1,000 mcg Oral Daily   Continuous Infusions: . sodium chloride 250 mL (01/18/20 0041)  . sodium chloride    . cefTRIAXone (ROCEPHIN)  IV 2 g (01/20/20 1124)  . lactated ringers     PRN Meds:.chlorpheniramine-HYDROcodone, docusate sodium, ipratropium-albuterol, metoprolol tartrate, ondansetron (ZOFRAN) IV, polyethylene glycol Medications Prior to Admission:  Prior to Admission medications   Medication Sig Start Date End Date Taking? Authorizing Provider  apixaban (ELIQUIS) 5 MG TABS tablet Take 5 mg by mouth 2 (two) times daily.   Yes [provider]  cephALEXin (KEFLEX) 500 MG  capsule Take 1 capsule (500 mg total) by mouth every 8 (eight) hours. 01/08/20  Yes Lynn Ito, MD  collagenase (SANTYL) ointment Apply topically daily. 01/09/20  Yes Lynn Ito, MD  diltiazem (CARDIZEM CD) 120 MG 24 hr capsule Take 1 capsule (120 mg total) by mouth daily. 01/08/20  Yes Lynn Ito, MD  fenofibrate 160 MG tablet Take 160 mg by mouth daily.   Yes [provider]  hydrocerin (EUCERIN) CREA Apply 1 application topically daily. 01/11/20  Yes Lynn Ito, MD  KLOR-CON 10  10 MEQ tablet Take 20 mEq by mouth daily.  10/22/16  Yes [provider]  latanoprost (XALATAN) 0.005 % ophthalmic solution USE 1 DROP IN [facility] EYE AT BEDTIME 05/18/15  Yes [provider]  magnesium oxide (MAG-OX) 400 (241.3 Mg) MG tablet Take 1 tablet (400 mg total) by mouth 2 (two) times daily. 01/10/20  Yes Lynn Ito, MD  metFORMIN (GLUCOPHAGE) 500 MG tablet Take 500 mg by mouth 2 (two) times daily with a meal.   Yes [provider]  metoprolol succinate (TOPROL-XL) 50 MG 24 hr tablet Take 1 tablet (50 mg total) by mouth 2 (two) times daily. 01/10/20  Yes Lynn Ito, MD  mirtazapine (REMERON) 30 MG tablet Take 30 mg by mouth at bedtime.   Yes [provider]  multivitamin-lutein (OCUVITE-LUTEIN) CAPS capsule Take 1 capsule by mouth daily. 11/09/17  Yes Wieting, Richard, MD  nystatin (MYCOSTATIN/NYSTOP) powder Apply 1 application topically 3 (three) times daily.   Yes [provider]  risperiDONE (RISPERDAL) 1 MG tablet Take 1 tablet (1 mg total) by mouth at bedtime. 01/03/17  Yes Enid Baas, MD  torsemide (DEMADEX) 20 MG tablet Take 20 mg by mouth daily.   Yes [provider]  tranexamic acid (LYSTEDA) 650 MG TABS tablet Take 2 tablets (1,300 mg total) by mouth 3 (three) times daily. 01/08/20  Yes Lynn Ito, MD  vitamin B-12 (CYANOCOBALAMIN) 1000 MCG tablet Take 1 tablet (1,000 mcg total) by mouth daily. 01/08/20  Yes Lynn Ito, MD  vitamin C (ASCORBIC  ACID) 250 MG tablet Take 500 mg by mouth 2 (two) times daily.   Yes [provider]  zinc sulfate 220 (50 Zn) MG capsule Take 220 mg by mouth daily.   Yes [provider]  metoprolol succinate (TOPROL-XL) 50 MG 24 hr tablet Take 50 mg by mouth daily. 09/04/17   [provider]   No Known Allergies Review of Systems  Respiratory: Positive for shortness of breath.     Physical Exam Pulmonary:     Comments: Work of breathing Neurological:     Mental Status: She is alert.     Vital Signs: BP (!) 162/55 (BP Location: Left Arm)   Pulse 98   Temp 98.4 F (36.9 C) (Axillary)   Resp (!) 26   Ht 5\' 2"  (1.575 m)   Wt 119.9 kg   SpO2 99%   BMI 48.35 kg/m  Pain Scale: 0-10   Pain Score: Asleep   SpO2: SpO2: 99 % O2 Device:SpO2: 99 % O2 Flow Rate: .O2 Flow Rate (L/min): 3 L/min  IO: Intake/output summary:   Intake/Output Summary (Last 24 hours) at 01/21/2020 0920 Last data filed at 01/21/2020 0710 Gross per 24 hour  Intake 820 ml  Output 750 ml  Net 70 ml    LBM: Last BM Date: 01/18/20 Baseline Weight: Weight: 112.9 kg Most recent weight: Weight: 119.9 kg     Palliative Assessment/Data: 30%     Time In: 4:00 Time Out: 4:30 Time Total: 30 min Greater than 50%  of this time was spent counseling and coordinating care related to the above assessment and plan.  Signed by: 01/20/20, NP   Please contact Palliative Medicine Team phone at [phone] for questions and concerns.  For individual provider: See [phone]

## 2020-01-21 NOTE — Progress Notes (Signed)
OT Cancellation Note  Patient Details Name: Jasmine Osborn MRN: [id number] DOB: 1951-03-22   Cancelled Treatment:    Reason Eval/Treat Not Completed: Other (comment). Per Palliative Care, pt is switching to comfort care and OT orders have been discharged at this time. Will sign off at this time.   Kathie Dike, M.S. OTR/L  01/21/20, 12:28 PM

## 2020-01-21 NOTE — Progress Notes (Addendum)
Daily Progress Note   Patient Name: Jasmine Osborn       Date: 01/21/2020 DOB: 09-Jun-1951  Age: 69 y.o. MRN#: [id number] Attending Physician: Debbe Odea, MD Primary Care Physician: Kirk Ruths, MD Admit Date: 01/19/2020  Reason for Consultation/Follow-up: Establishing goals of care  Subjective: Patient is sitting in bed. RN just removed BIPAP. Bruising to bridge of nose, and BIPAP imprint in place including across this area. Within minutes patient begins working to breathe. Husband at bedside. We recapped yesterday's conversation. He agrees with statements on her day to day activities and what is acceptable QOL for her. She states "I'm suffering Rance, I'm ready to go." And "I'm tired of fighting."   We discussed her diagnosis, prognosis, GOC, EOL wishes disposition and options.  A detailed discussion was had today regarding advanced directives.  Concepts specific to code status, artifical feeding and hydration, IV antibiotics and rehospitalization were discussed.  The difference between an aggressive medical intervention path and a comfort care path was discussed.  Values and goals of care important to patient and family were attempted to be elicited.  Discussed limitations of medical interventions to prolong quality of life in some situations and discussed the concept of human mortality.   Decision made to switch to full comfort care when children arrive. One works out of town. She would like medication for comfort at this time, but to try to stay awake and alert to spend time with family. She would like to continue O2 until children arrive and depending on how she feels, may decide to remove nasal cannula at that time as she states she is tired of wearing the O2, and is ready to  go and does not want to suffer. Per her request, hospice facility bed requested. If she chooses to be liberated from O2 and BIPAP here, she may not be stable to transport and would die here in the hospital. She is aware of this, and accepting.   I completed a MOST form today and the signed original was placed in the chart. A photocopy was placed in the chart to be scanned into EMR. The patient outlined their wishes for the following treatment decisions:  Cardiopulmonary Resuscitation: Do Not Attempt Resuscitation (DNR/No CPR)  Medical Interventions: Comfort Measures: Keep clean, warm, and dry. Use medication by any route,  positioning, wound care, and other measures to relieve pain and suffering. Use oxygen, suction and manual treatment of airway obstruction as needed for comfort. Do not transfer to the hospital unless comfort needs cannot be met in current location.  Antibiotics: No antibiotics (use other measures to relieve symptoms)  IV Fluids: No IV fluids (provide other measures to ensure comfort)  Feeding Tube: No feeding tube    Length of Stay: 4  Current Medications: Scheduled Meds:  . Chlorhexidine Gluconate Cloth  6 each Topical Daily  . diltiazem  120 mg Oral Daily  . hydrocerin  1 application Topical Daily  . hydrocortisone sod succinate (SOLU-CORTEF) inj  50 mg Intravenous Q12H  . ipratropium-albuterol  3 mL Nebulization Q6H  . latanoprost  1 drop Both Eyes QHS  . mirtazapine  30 mg Oral QHS  . multivitamin-lutein  1 capsule Oral Daily  . Ensure Max Protein  11 oz Oral BID BM  . risperiDONE  1 mg Oral QHS  . sodium chloride flush  3 mL Intravenous Q12H    Continuous Infusions: . sodium chloride    . lactated ringers      PRN Meds: sodium chloride, acetaminophen **OR** acetaminophen, antiseptic oral rinse, chlorpheniramine-HYDROcodone, docusate sodium, glycopyrrolate **OR** glycopyrrolate **OR** glycopyrrolate, haloperidol **OR** haloperidol **OR** haloperidol lactate,  ipratropium-albuterol, LORazepam **OR** LORazepam **OR** LORazepam, metoprolol tartrate, morphine injection, ondansetron (ZOFRAN) IV, ondansetron **OR** ondansetron (ZOFRAN) IV, polyethylene glycol, polyvinyl alcohol, sodium chloride flush  Physical Exam Pulmonary:     Comments: Work of breathing. Skin:    Comments: Edema. Bruising to bridge of nose and imprint of BIPAP mask to face including this area.   Neurological:     Mental Status: She is alert.             Vital Signs: BP (!) 162/55 (BP Location: Left Arm)   Pulse 92   Temp 98.4 F (36.9 C) (Axillary)   Resp (!) 22   Ht '5\' 2"'  (1.575 m)   Wt 119.9 kg   SpO2 98%   BMI 48.35 kg/m  SpO2: SpO2: 98 % O2 Device: O2 Device: Bi-PAP O2 Flow Rate: O2 Flow Rate (L/min): 3 L/min  Intake/output summary:   Intake/Output Summary (Last 24 hours) at 01/21/2020 1135 Last data filed at 01/21/2020 0710 Gross per 24 hour  Intake 820 ml  Output 750 ml  Net 70 ml   LBM: Last BM Date: 01/18/20 Baseline Weight: Weight: 112.9 kg Most recent weight: Weight: 119.9 kg       Palliative Assessment/Data:      Patient Active Problem List   Diagnosis Date Noted  . AKI (acute kidney injury) ([place]) 01/20/2020  . Obesity hypoventilation syndrome ([place]) 01/20/2020  . COPD (chronic obstructive pulmonary disease) ([place]) 01/20/2020  . B12 deficiency 01/20/2020  . DM (diabetes mellitus), type 2 ([place]) 01/20/2020  . Acute lower UTI 01/20/2020  . Septic shock ([place]) 01/10/2020  . Cellulitis of left lower extremity   . Skin ulcer of toe of left foot, limited to breakdown of skin ([place])   . AF (paroxysmal atrial fibrillation) ([place])   . Symptomatic anemia 01/03/2020  . Vaginal bleeding 01/03/2020  . ARF (acute renal failure) ([place]) 01/03/2020  . Anemia 12/16/2017  . Persistent atrial fibrillation ([place]) 11/05/2017  . Morbid obesity ([place]) 11/05/2017  . Acute on chronic respiratory failure with hypoxia and hypercapnia ([place]) 11/02/2017  . Increased  endometrial stripe thickness 06/04/2017  . Pressure injury of skin 12/23/2016  . Respiratory failure with hypercapnia ([place]) 12/22/2016  Palliative Care Assessment & Plan   Recommendations/Plan:  Small doses of medications in place per her request to "knock the edge off", and help with comfort as she waits for her children. She would like to remain awake and alert to visit with them.   Would recommend liberalizing PRN medications once family has been able to visit and she shifts to full comfort care.   Hospice facility bed requested. Depending on how she feels, she has stated she may decide to remove the nasal cannula in addition to no longer using BIPAP as she is tired of both. If she chooses this, would recommend patient completing end of life process here in the hospital. Would recommend a Morphine drip if this occurs.     Code Status:    Code Status Orders  (From admission, onward)         Start     Ordered   01/21/20 1128  Do not attempt resuscitation (DNR)  Continuous    Question Answer Comment  In the event of cardiac or respiratory ARREST Do not call a "code blue"   In the event of cardiac or respiratory ARREST Do not perform Intubation, CPR, defibrillation or ACLS   In the event of cardiac or respiratory ARREST Use medication by any route, position, wound care, and other measures to relive pain and suffering. May use oxygen, suction and manual treatment of airway obstruction as needed for comfort.   Comments MOST form on chart.      01/21/20 1131        Code Status History    Date Active Date Inactive Code Status Order ID Comments User Context   01/13/2020 2109 01/21/2020 1131 DNR [id number]  Awilda Bill, NP ED   01/03/2020 2137 01/10/2020 2046 Full Code [id number]  Elwyn Reach, MD ED   12/16/2017 1842 12/17/2017 2015 Full Code [id number]  Loletha Grayer, MD ED   11/04/2017 1221 11/08/2017 1905 DNR [id number]  Asencion Gowda, NP Inpatient   11/02/2017 1731  11/04/2017 1221 Full Code [id number]  Henreitta Leber, MD Inpatient   12/22/2016 1825 01/03/2017 2011 Full Code [id number]  Hermelinda Dellen DO ED   Advance Care Planning Activity    Advance Directive Documentation     Most Recent Value  Type of Advance Directive  Out of facility DNR (pink MOST or yellow form)  Pre-existing out of facility DNR order (yellow form or pink MOST form)  Pink MOST form placed in chart (order not valid for inpatient use)  "MOST" Form in Place?  --       Prognosis:  < 2 weeks if she continues O2 by nasal cannula and without BIPAP. Hours to days if she decides to liberate from nasal cannula. Urosepsis. COPD. Hypoventilation syndrome.   Care plan was discussed with RN  Thank you for allowing the Palliative Medicine Team to assist in the care of this patient.   Time In: 10:30 Time Out: 12:00 Total Time 90 min Prolonged Time Billed  yes      Greater than 50%  of this time was spent counseling and coordinating care related to the above assessment and plan.  Asencion Gowda, NP  Please contact Palliative Medicine Team phone at [phone] for questions and concerns.

## 2020-01-21 NOTE — TOC Progression Note (Signed)
Transition of Care [facility] [place]) - Progression Note    Patient Details  Name: Jasmine Osborn MRN: [id number] Date of Birth: 1951/09/04  [facility]) CM/SW Contact  Trenton Founds, RN Phone Number: 01/21/2020, 2:13 PM  Clinical Narrative:  RNCM contacted [facility] liaison [facility] after confirming with Crystal, Palliative Care NP that a residential Hospice is the plan. French Ana reports that at this time they don't have any beds but she will review the case and touch base with the family.      Expected Discharge Plan: Skilled Nursing Facility Barriers to Discharge: Continued Medical Work up  Expected Discharge Plan and Services Expected Discharge Plan: Skilled Nursing Facility       Living arrangements for the past 2 months: Skilled Nursing Facility, Single Family Home                                       Social Determinants of Health (SDOH) Interventions    Readmission Risk Interventions Readmission Risk Prevention Plan 01/18/2020  Transportation Screening Complete  Medication Review Oceanographer) Complete  PCP or Specialist appointment within 3-5 days of discharge Complete  HRI or Home Care Consult Complete  Skilled Nursing Facility Complete  Some recent data might be hidden

## 2020-01-21 NOTE — Progress Notes (Signed)
Patient received with some respiratory distress. HR was 180 RR 45. Husband and daughter at the bedside. PRN ativan and morphine administered with positive effects. Will continue to monitor.

## 2020-01-21 NOTE — Progress Notes (Signed)
Pt with palliative care team at bedside. Palliative care NP requests that we do not provide medications to patient until end of consultation.

## 2020-01-21 NOTE — Progress Notes (Addendum)
Civil engineer, contracting [facility]) Hospital Liaison RN note  Received request from Thea Gist, RN with [facility] team for family interest in Hospice Home. Chart reviewed and spoke with patient, husband and children at bedside to confirm interest and explain services. Patient and family request to transfer tomorrow morning when room is available. TOC aware. Registration paperwork completed today at the Hospice Home with Loletta Specter, LCSW and husband Burman Freestone.   Please fax discharge summary to [phone] when complete.  RN please call report to [phone].  Please call with any hospice related questions or concerns.  Thank you, Haynes Bast, BSN, RN [facility] Liaison [phone]

## 2020-01-22 DIAGNOSIS — A419 Sepsis, unspecified organism: Secondary | ICD-10-CM | POA: Diagnosis not present

## 2020-01-22 DIAGNOSIS — E1169 Type 2 diabetes mellitus with other specified complication: Secondary | ICD-10-CM | POA: Diagnosis not present

## 2020-01-22 DIAGNOSIS — N390 Urinary tract infection, site not specified: Secondary | ICD-10-CM | POA: Diagnosis not present

## 2020-01-22 DIAGNOSIS — J449 Chronic obstructive pulmonary disease, unspecified: Secondary | ICD-10-CM | POA: Diagnosis not present

## 2020-01-22 LAB — CULTURE, BLOOD (ROUTINE X 2)
Culture: NO GROWTH
Culture: NO GROWTH
Special Requests: ADEQUATE

## 2020-01-22 MED ORDER — MORPHINE 100MG IN NS 100ML (1MG/ML) PREMIX INFUSION
1.0000 mg/h | INTRAVENOUS | Status: DC
  Administered 2020-01-22: 1 mg/h via INTRAVENOUS
  Filled 2020-01-22: qty 100

## 2020-01-22 MED ORDER — MORPHINE SULFATE (CONCENTRATE) 10 MG/0.5ML PO SOLN: 5.0000 mg | ORAL | Status: DC | PRN

## 2020-01-22 MED ORDER — MORPHINE SULFATE (PF) 2 MG/ML IV SOLN
2.0000 mg | INTRAVENOUS | Status: DC | PRN
  Administered 2020-01-22: 08:00:00 2 mg via INTRAVENOUS

## 2020-02-05 NOTE — Progress Notes (Signed)
Attempted to take patient off venturi mask, patient immediately started grunting. Titrated morphine drip up per order to 4mg  and gave one time dose of 2 mg to address for grunting, groaning, and facial grimace added veturi mask back until stabilized on morphine at family request.

## 2020-02-05 NOTE — Progress Notes (Signed)
Pt unresponsive with venturi mask as place by [facility] RN, at start of shift. At 0750 rechecked patient status and is undergoing respiratory treatment at this time. Has facial grimace and is occasionally grunting and using accessory muscles. Request sent to MD to liberalize her medications.

## 2020-02-05 NOTE — Progress Notes (Signed)
Pt husband at bedside. Discussed patients change in status and husband is agreeable to titrating down O2 and starting morphine drip as indicated by palliative care team. Message sent to MD requesting changes.

## 2020-02-05 NOTE — Discharge Summary (Incomplete)
Physician Discharge Summary  ?[name] CWU:[id number] DOB: 26-Nov-1950 DOA: 20-Jan-2020 ? ?PCP: Lauro Regulus, MD ? ?Admit date: 2020-01-20 ?Discharge date: 01/24/2020 ? ?Admitted From: SNF ?Disposition:    ? ?Recommendations for Outpatient Follow-up:  ?1. *** ?  ?Discharge Condition:  stable   ?CODE STATUS:  DNR   ?Diet recommendation:  As tolerated ?Consultations: ?? Nephrology ?? Cardiology ?? Palliative care  ?? PCCM ?Procedures/Studies: ?? *** ? ? ?Discharge Diagnoses:  ?Principal Problem: ?  Septic shock (HCC) ?Active Problems: ?  Persistent atrial fibrillation (HCC) ?  Morbid obesity (HCC) ?  AKI (acute kidney injury) (HCC) ?  Obesity hypoventilation syndrome (HCC) ?  COPD (chronic obstructive pulmonary disease) (HCC) ?  B12 deficiency ?  DM (diabetes mellitus), type 2 (HCC) ?  Acute lower UTI ? ? ? ? ?Brief Summary: ?Jasmine Osborn ?69 yo female with  COPD, morbid obesity, OHS,   HTN, Abdominal Hernia, Hyperlipidemia, Chronic Atrial Fibrillation,Type II Diabetes Mellitus, Depression, vit B12 deficiency & recent vaginal bleeding. ? ?Admitted 3/29-4/5 for anemia, vaginal bleeding in setting of Eliquis, D&C done on 3/31 and IUD placed. Megace discontinued.  ?Now presents from SNF ([facility]) with SBP in 40s, temp 99,  lactic acid 5.6, WBC 24, Cr 2.47. ?Admitted to ICU for septic shock due to UTI and possibly pneumonia and placed on BiPAP and IV antibiotics. ? ?4/13> noted to have hematuria, Jasmine Osborn d/c'd ??  ?? ? ?Hospital Course:  ?Principal Problem: ?  Septic shock - Acute lower UTI - possible pneumonia- acute respiratory failure ?- required BiPAP initially ?- PCT 105.95 ?- WBC 24.8 > 10.7  ?- CXR > atelectasis or infiltrates at base, has had a cough which is improving per husband   ?- Vanc/Cefepime started on 4/12- transitioned to Ceftriaxone 4/15 - lactic acidosis resolved ?- U cultue > 100K E coli- currently receiving Ceftriaxone which is it sensitive to ?- weaned off pressors ? - weaned off of Solucortef   ?? ?? ?Active Problems: ?Vaginal bleed

## 2020-02-05 NOTE — Progress Notes (Signed)
On call chaplain paged. Received call back promptly.and will report to bedside within the hour.

## 2020-02-05 NOTE — Death Summary Note (Signed)
DEATH SUMMARY   Patient Details  Name: Jasmine Osborn MRN: [id number] DOB: 08/22/1951  Admission/Discharge Information   Admit Date:  01/23/2020  Date of Death:    Time of Death:    Length of Stay: 5  Referring Physician: [name], Marshall W, MD      Diagnoses  Preliminary cause of death:  Secondary Diagnoses (including complications and co-morbidities):  Principal Problem:   Septic shock (HCC) Active Problems: Acute lower UTI Vaginal bleeding Anemia   Persistent atrial fibrillation (HCC)   Morbid obesity (HCC)   AKI (acute kidney injury) CKD3   Obesity hypoventilation syndrome (HCC)   COPD (chronic obstructive pulmonary disease) (HCC)   B12 deficiency   DM (diabetes mellitus), type 2 (HCC) Mild Thrombocytopenia      Brief Hospital Course (including significant findings, care, treatment, and services provided and events leading to death)  Jasmine Osborn is a 69 y.o. year old female who Zenaida O Ion 69 yo female with  COPD, morbid obesity, OHS,   HTN, Abdominal Hernia, Hyperlipidemia, Chronic Atrial Fibrillation,Type II Diabetes Mellitus, Depression, vit B12 deficiency, recent Left LE ulcer & recent vaginal bleeding. Admitted 3/29-4/5 for anemia, vaginal bleeding in setting of Eliquis, D&C done on 3/31 and IUD placed. Megace discontinued.  Now presents from SNF ([facility]) with SBP in 40s, temp 99,  lactic acid 5.6, WBC 24, Cr 2.47. Admitted to ICU for septic shock due to UTI and possibly pneumonia.     See hospital course below.  Principal Problem:   Septic shock - Acute lower UTI possible pneumonia - CXR > atelectasis or infiltrates at base, has had a cough which is improving per husband   - Vanc/Cefepime started on 4/12- transitioned to Ceftriaxone 4/15 - 4/18 would complete a 7 day course which I am expecting - lactic acidosis resolved - PCT 105.95 - WBC 24.8 > 10.7  - U cultue > 100K E coli- currently receiving Ceftriaxone which is it sensitive  to - weaned off pressors &Solucortef    Active Problems: Vaginal bleeding - has blood on the sheets but not excessive so likely is oozing - 4/15>  have left message for Dr Dalbert GarnetBeasley who did her D & C a couple of weeks ago that she is bleeding and I would like a consult- no consult as of yet - Apixaban has been on hold - 4/16- less blood on sheets today- per RN, it seems to be like mild menses bleeding - patient unaware of the bleeding  Anemia -  possibly dilutional with slow blood loss from above and underlying anemia of chronic disease  - Hb 9.9 >>> 7.9 - 8.2  -  anemia panel      Ref. Range 01/21/2020 05:09  Iron Latest Ref Range: 28 - 170 ug/dL 38  UIBC Latest Units: ug/dL 409295  TIBC Latest Ref Range: 250 - 450 ug/dL 811333  Saturation Ratios Latest Ref Range: 10.4 - 31.8 % 11  Ferritin Latest Ref Range: 11 - 307 ng/mL 73  Folate Latest Ref Range: >5.9 ng/mL 17.3      Persistent atrial fibrillation  - 4/16 resumed Cardizem while watching BP - resumed Toprol 50 mg today - Apixaban has been on hold due to the vaginal bleeding    Morbid obesity  Body mass index is 48.35 kg/m.    AKI (acute kidney injury)- CKD 3a - Cr 2.47 up from ~ 1.2 baseline- likely due to use of diuretic and above sepsis/ hypotension/ infection - now 1.33 - resumed  Demadex 20 mg today    Obesity hypoventilation syndrome (HCC)   COPD (chronic obstructive pulmonary disease)  - OSA? - on 3 L O2 at baseline which is what she is on currently - uses BiPAP at night    DM (diabetes mellitus), type 2  - hold Metformin - cont SSI - last A1c 5.0 on 01/19/20  Mild thrombocytopenia  - Plt 133> 120 - follow  On 4/15 a palliative care consult was requested to address GOC. The patient's acute respiratory failure was resolved but she felt that her quality of life was poor. She told her husband she was suffering and "ready to go". Please see palliative care notes. She decided to transition to comfort care  and hospice. She no longer wanted O2 or CPAP. She used PRN Ativan and Morphine for respiratory discomfort for RR in the 40s. On 4/17 she was transitioned to a Morphine infusion and passed around 6 PM.    Pertinent Labs and Studies  Significant Diagnostic Studies US PELVIS (TRANSABDOMINAL ONLY)  Result Date: 01/04/2020 CLINICAL DATA:  Menorrhagia.  Severe anemia. EXAM: TRANSABDOMINAL ULTRASOUND OF PELVIS TECHNIQUE: Transabdominal ultrasound examination of the pelvis was performed including evaluation of the uterus, ovaries, adnexal regions, and pelvic cul-de-sac. COMPARISON:  Pelvic MRI 05/08/2017. CT abdomen and pelvis 07/14/2017. Pelvic ultrasound 07/27/2010. FINDINGS: Examination is limited by patient body habitus. A transvaginal examination could not be performed. Uterus Measurements: 14.9 x 8.3 x 9.1 cm = volume: 589 mL. Chronically enlarged, heterogeneous uterus with clustered nodular echogenic foci near the uterine fundus. Endometrium Thickness: 9 mm.  No focal abnormality visualized. Right ovary Not visualized. Left ovary Not visualized. Other findings:  No abnormal free fluid. IMPRESSION: 1. Limited transabdominal pelvic ultrasound. 2. Chronically enlarged and heterogeneous uterus with diffuse adenomyosis diagnosed on prior MRI. 3. 9 mm endometrial thickness, less than on the 2018 MRI but still abnormal for age. In the setting of post-menopausal bleeding, endometrial sampling is indicated to exclude carcinoma. If results are benign, sonohysterogram should be considered for focal lesion work-up. (Ref: Radiological Reasoning: Algorithmic Workup of Abnormal Vaginal Bleeding with Endovaginal Sonography and Sonohysterography. AJR 2008; 841:L24-40) 4. Nonvisualization of the ovaries. Electronically Signed   By: Sebastian Ache M.D.   On: 01/04/2020 18:31   DG Chest Port 1 View  Result Date: 01/18/2020 CLINICAL DATA:  69 year old female with history of acute respiratory failure. History of atrial  fibrillation. EXAM: PORTABLE CHEST 1 VIEW COMPARISON:  Chest x-ray 2020-01-27. FINDINGS: Lung volumes are low. Bibasilar opacities (left greater than right) may reflect areas of atelectasis and/or consolidation. Small left pleural effusion. No right pleural effusion. Cephalization of the pulmonary vasculature with indistinct interstitial markings. Mild cardiomegaly. Upper mediastinal contours are distorted by patient's rotation to the left. IMPRESSION: 1. The appearance the chest again suggests mild congestive heart failure. 2. Additional bibasilar opacities may reflect areas of atelectasis and/or consolidation. Small left pleural effusion. Electronically Signed   By: Trudie Reed M.D.   On: 01/18/2020 07:23   DG Chest Portable 1 View  Result Date: 2020/01/27 CLINICAL DATA:  Tachypnea EXAM: PORTABLE CHEST 1 VIEW COMPARISON:  01/06/2020, CT chest 11/02/2017 FINDINGS: Cardiomegaly with vascular congestion and diffuse interstitial and hazy opacities suspicious for edema. More confluent airspace disease at the left base. No pneumothorax. IMPRESSION: 1. Cardiomegaly with vascular congestion and probable pulmonary edema. 2. Possible small pleural effusions. Hazy atelectasis or pneumonia at the bases. Overall the findings do not appear greatly changed from 01/06/2020. Electronically Signed   By: Selena Batten  Jake Samples M.D.   On: 02/03/2020 19:37   DG Chest Port 1 View  Result Date: 01/06/2020 CLINICAL DATA:  Acute respiratory failure with hypoxia EXAM: PORTABLE CHEST 1 VIEW COMPARISON:  Two days ago FINDINGS: Cardiomegaly with vascular pedicle widening primarily from mediastinal fat. There is vascular congestion and hazy opacity at the bases. No visible pneumothorax IMPRESSION: Cardiomegaly and vascular congestion. Atelectasis, pleural fluid, or infection could be present at the bases. Electronically Signed   By: Marnee Spring M.D.   On: 01/06/2020 05:17   DG CHEST PORT 1 VIEW  Result Date: 01/04/2020 CLINICAL  DATA:  Tachypnea EXAM: PORTABLE CHEST 1 VIEW COMPARISON:  11/04/2017 FINDINGS: Cardiac shadow is enlarged. Increased vascular congestion is noted with interstitial edema consistent with congestive failure. No focal infiltrate is seen. No acute bony abnormality is noted. Degenerative changes of the thoracic spine are seen. IMPRESSION: Changes consistent with CHF. Electronically Signed   By: Alcide Clever M.D.   On: 01/04/2020 22:22   DG Foot Complete Left  Result Date: 01/04/2020 CLINICAL DATA:  Ulcer along the left heel and at the toes. EXAM: LEFT FOOT - COMPLETE 3+ VIEW COMPARISON:  Left foot radiographs 01/03/2012. FINDINGS: Postoperative changes of the distal tibia are again noted. Fractured screw is stable. Prominent plantar calcaneal spur is noted. Soft tissue ulceration is evident without penetration of the bone. No acute or focal osseous abnormalities are associated. Extensive soft tissue swelling is present over the foot. Ulcerations involving the digits are not well appreciated due to overlap. No focal osseous erosion is evident. IMPRESSION: 1. Plantar ulceration at the calcaneus without acute osseous abnormality. 2. Prominent plantar spur at the calcaneus. 3. Extensive soft tissue swelling over the foot without other acute or focal osseous abnormality. Electronically Signed   By: Marin Roberts M.D.   On: 01/04/2020 09:22   ECHOCARDIOGRAM COMPLETE  Result Date: 01/05/2020    ECHOCARDIOGRAM REPORT   Patient Name:   LAMARI YOUNGERS Date of Exam: 01/05/2020 Medical Rec #:  [id number]           Height:       64.0 in Accession #:    [id number]          Weight:       254.4 lb Date of Birth:  27-Jan-1951            BSA:          2.167 m Patient Age:    69 years            BP:           136/40 mmHg Patient Gender: F                   HR:           92 bpm. Exam Location:  [facility] Procedure: 2D Echo, Cardiac Doppler, Color Doppler and Intracardiac            Opacification Agent Indications:     R94.31  Abnormla EKG  History:         Patient has prior history of Echocardiogram examinations, most                  recent 11/04/2017. Risk Factors:Hypertension, Dyslipidemia,                  Diabetes and Obesity. Obesity hypoventilation syndrome. Atrial                  Fibrillation.  Sonographer:  Sedonia Small Rodgers-Jones Referring Phys:  [id number] [facility] Diagnosing Phys: Alwyn Pea MD IMPRESSIONS  1. Left ventricular ejection fraction, by estimation, is 65 to 70%. The left ventricle has normal function. The left ventricle has no regional wall motion abnormalities. Left ventricular diastolic parameters are consistent with Grade I diastolic dysfunction (impaired relaxation).  2. Right ventricular systolic function is normal. The right ventricular size is normal. There is moderately elevated pulmonary artery systolic pressure.  3. The mitral valve is normal in structure. Trivial mitral valve regurgitation.  4. The aortic valve is normal in structure. Aortic valve regurgitation is not visualized. FINDINGS  Left Ventricle: Left ventricular ejection fraction, by estimation, is 65 to 70%. The left ventricle has normal function. The left ventricle has no regional wall motion abnormalities. Definity contrast agent was given IV to delineate the left ventricular  endocardial borders. The left ventricular internal cavity size was normal in size. There is no left ventricular hypertrophy. Left ventricular diastolic parameters are consistent with Grade I diastolic dysfunction (impaired relaxation). Right Ventricle: The right ventricular size is normal. No increase in right ventricular wall thickness. Right ventricular systolic function is normal. There is moderately elevated pulmonary artery systolic pressure. The tricuspid regurgitant velocity is 2.96 m/s, and with an assumed right atrial pressure of 10 mmHg, the estimated right ventricular systolic pressure is 45.0 mmHg. Left Atrium: Left atrial size was normal in size.  Right Atrium: Right atrial size was normal in size. Pericardium: There is no evidence of pericardial effusion. Mitral Valve: The mitral valve is normal in structure. Trivial mitral valve regurgitation. Tricuspid Valve: The tricuspid valve is normal in structure. Tricuspid valve regurgitation is mild. Aortic Valve: The aortic valve is normal in structure. Aortic valve regurgitation is not visualized. Aortic valve mean gradient measures 10.2 mmHg. Aortic valve peak gradient measures 16.0 mmHg. Aortic valve area, by VTI measures 1.86 cm. Pulmonic Valve: The pulmonic valve was normal in structure. Pulmonic valve regurgitation is not visualized. Aorta: The aortic root is normal in size and structure. IAS/Shunts: The atrial septum is grossly normal.  LEFT VENTRICLE PLAX 2D LVIDd:         4.56 cm  Diastology LVIDs:         2.79 cm  LV e' lateral:   7.40 cm/s LV PW:         1.03 cm  LV E/e' lateral: 19.1 LV IVS:        0.98 cm  LV e' medial:    5.55 cm/s LVOT diam:     1.80 cm  LV E/e' medial:  25.4 LV SV:         69 LV SV Index:   32 LVOT Area:     2.54 cm  RIGHT VENTRICLE RV Basal diam:  3.43 cm RV S prime:     14.10 cm/s TAPSE (M-mode): 2.3 cm LEFT ATRIUM             Index       RIGHT ATRIUM           Index LA diam:        4.10 cm 1.89 cm/m  RA Area:     15.80 cm LA Vol (A2C):   61.3 ml 28.29 ml/m RA Volume:   42.20 ml  19.47 ml/m LA Vol (A4C):   49.2 ml 22.70 ml/m LA Biplane Vol: 56.6 ml 26.12 ml/m  AORTIC VALVE AV Area (Vmax):    1.77 cm AV Area (Vmean):   1.72 cm AV Area (  VTI):     1.86 cm AV Vmax:           199.75 cm/s AV Vmean:          153.500 cm/s AV VTI:            0.371 m AV Peak Grad:      16.0 mmHg AV Mean Grad:      10.2 mmHg LVOT Vmax:         139.00 cm/s LVOT Vmean:        104.000 cm/s LVOT VTI:          0.271 m LVOT/AV VTI ratio: 0.73  AORTA Ao Root diam: 2.90 cm MITRAL VALVE                TRICUSPID VALVE MV Area (PHT): 3.65 cm     TR Peak grad:   35.0 mmHg MV Decel Time: 208 msec     TR Vmax:         296.00 cm/s MV E velocity: 141.00 cm/s MV A velocity: 122.00 cm/s  SHUNTS MV E/A ratio:  1.16         Systemic VTI:  0.27 m                             Systemic Diam: 1.80 cm Alwyn Pea MD Electronically signed by Alwyn Pea MD Signature Date/Time: 01/05/2020/10:58:40 PM    Final     Microbiology Recent Results (from the past 240 hour(s))  Blood Culture (routine x 2)     Status: None   Collection Time: 01-25-2020  7:16 PM   Specimen: BLOOD  Result Value Ref Range Status   Specimen Description BLOOD BLOOD RIGHT HAND  Final   Special Requests   Final    BOTTLES DRAWN AEROBIC AND ANAEROBIC Blood Culture adequate volume   Culture   Final    NO GROWTH 5 DAYS Performed at [facility], [address] [place], [place] [address]    Report Status 01/10/2020 FINAL  Final  Blood Culture (routine x 2)     Status: None   Collection Time: 01/25/20  7:16 PM   Specimen: BLOOD  Result Value Ref Range Status   Specimen Description BLOOD BLOOD LEFT HAND  Final   Special Requests   Final    BOTTLES DRAWN AEROBIC AND ANAEROBIC Blood Culture results may not be optimal due to an excessive volume of blood received in culture bottles   Culture   Final    NO GROWTH 5 DAYS Performed at [facility], [address] [place], [place] [address]    Report Status 01/12/2020 FINAL  Final  SARS CORONAVIRUS 2 (TAT 6-24 HRS) Nasopharyngeal Nasopharyngeal Swab     Status: None   Collection Time: 01/25/2020  7:27 PM   Specimen: Nasopharyngeal Swab  Result Value Ref Range Status   SARS Coronavirus 2 NEGATIVE NEGATIVE Final    Comment: (NOTE) SARS-CoV-2 target nucleic acids are NOT DETECTED. The SARS-CoV-2 RNA is generally detectable in upper and lower respiratory specimens during the acute phase of infection. Negative results do not preclude SARS-CoV-2 infection, do not rule out co-infections with other pathogens, and should not be used as the sole basis for treatment or other  patient management decisions. Negative results must be combined with clinical observations, patient history, and epidemiological information. The expected result is Negative. Fact Sheet for Patients: HairSlick.no Fact Sheet for Healthcare Providers: quierodirigir.com This  test is not yet approved or cleared by the [place] and  has been authorized for detection and/or diagnosis of SARS-CoV-2 by FDA under an Emergency Use Authorization (EUA). This EUA will remain  in effect (meaning this test can be used) for the duration of the COVID-19 declaration under Section 56 4(b)(1) of the Act, 21 U.S.C. section 360bbb-3(b)(1), unless the authorization is terminated or revoked sooner. Performed at [facility], [place] [address] [place], [place] [address]   Urine Culture     Status: Abnormal   Collection Time: 01/23/2020  9:43 PM   Specimen: Urine, Random  Result Value Ref Range Status   Specimen Description   Final    URINE, RANDOM Performed at [facility], [address] [place], [place] [address]    Special Requests   Final    NONE Performed at [facility], [address] [place], [place] [address]    Culture >=100,000 COLONIES/mL ESCHERICHIA COLI (A)  Final   Report Status 01/20/2020 FINAL  Final   Organism ID, Bacteria ESCHERICHIA COLI (A)  Final      Susceptibility   Escherichia coli - MIC*    AMPICILLIN <=2 SENSITIVE Sensitive     CEFAZOLIN <=4 SENSITIVE Sensitive     CEFTRIAXONE <=0.25 SENSITIVE Sensitive     CIPROFLOXACIN >=4 RESISTANT Resistant     GENTAMICIN <=1 SENSITIVE Sensitive     IMIPENEM <=0.25 SENSITIVE Sensitive     NITROFURANTOIN <=16 SENSITIVE Sensitive     TRIMETH/SULFA <=20 SENSITIVE Sensitive     AMPICILLIN/SULBACTAM <=2 SENSITIVE Sensitive     PIP/TAZO <=4 SENSITIVE Sensitive     * >=100,000 COLONIES/mL ESCHERICHIA COLI  Respiratory Panel by RT PCR (Flu A&B, Covid) -  Nasopharyngeal Swab     Status: None   Collection Time: 01/18/2020 10:50 PM   Specimen: Nasopharyngeal Swab  Result Value Ref Range Status   SARS Coronavirus 2 by RT PCR NEGATIVE NEGATIVE Final    Comment: (NOTE) SARS-CoV-2 target nucleic acids are NOT DETECTED. The SARS-CoV-2 RNA is generally detectable in upper respiratoy specimens during the acute phase of infection. The lowest concentration of SARS-CoV-2 viral copies this assay can detect is 131 copies/mL. A negative result does not preclude SARS-Cov-2 infection and should not be used as the sole basis for treatment or other patient management decisions. A negative result may occur with  improper specimen collection/handling, submission of specimen other than nasopharyngeal swab, presence of viral mutation(s) within the areas targeted by this assay, and inadequate number of viral copies (<131 copies/mL). A negative result must be combined with clinical observations, patient history, and epidemiological information. The expected result is Negative. Fact Sheet for Patients:  PinkCheek.be Fact Sheet for Healthcare Providers:  GravelBags.it This test is not yet ap proved or cleared by the [place] FDA and  has been authorized for detection and/or diagnosis of SARS-CoV-2 by FDA under an Emergency Use Authorization (EUA). This EUA will remain  in effect (meaning this test can be used) for the duration of the COVID-19 declaration under Section 564(b)(1) of the Act, 21 U.S.C. section 360bbb-3(b)(1), unless the authorization is terminated or revoked sooner.    Influenza A by PCR NEGATIVE NEGATIVE Final   Influenza B by PCR NEGATIVE NEGATIVE Final    Comment: (NOTE) The Xpert Xpress SARS-CoV-2/FLU/RSV assay is intended as an aid in  the diagnosis of influenza from Nasopharyngeal swab specimens and  should not be used as a sole basis for treatment. Nasal washings  and  aspirates  are unacceptable for Xpert Xpress SARS-CoV-2/FLU/RSV  testing. Fact Sheet for Patients: https://www.moore.com/ Fact Sheet for Healthcare Providers: https://www.young.biz/ This test is not yet approved or cleared by the [place] FDA and  has been authorized for detection and/or diagnosis of SARS-CoV-2 by  FDA under an Emergency Use Authorization (EUA). This EUA will remain  in effect (meaning this test can be used) for the duration of the  Covid-19 declaration under Section 564(b)(1) of the Act, 21  U.S.C. section 360bbb-3(b)(1), unless the authorization is  terminated or revoked. Performed at [facility], [address] [place], [place] [address]   MRSA PCR Screening     Status: None   Collection Time: 01/18/20  3:02 AM   Specimen: Nasopharyngeal  Result Value Ref Range Status   MRSA by PCR NEGATIVE NEGATIVE Final    Comment:        The GeneXpert MRSA Assay (FDA approved for NASAL specimens only), is one component of a comprehensive MRSA colonization surveillance program. It is not intended to diagnose MRSA infection nor to guide or monitor treatment for MRSA infections. Performed at [facility], [address] [place], [place] [address]   Expectorated sputum assessment w rflx to resp cult     Status: None   Collection Time: 01/18/20  1:31 PM   Specimen: Sputum  Result Value Ref Range Status   Specimen Description SPUTUM  Final   Special Requests NONE  Final   Sputum evaluation   Final    Sputum specimen not acceptable for testing.  Please recollect.   SPOKE TO Ladell Heads RN AT 1435 ON 01/18/20 SNG  Performed at [facility], [address] [place], [place] [address]    Report Status 01/18/2020 FINAL  Final    Lab Basic Metabolic Panel: Recent Labs  Lab 02-07-20 1916 01/18/20 0341 01/19/20 0456 01/20/20 0630 01/21/20 0509  NA 141 143 141 142 146*  K 3.7 4.4 4.7 4.7 4.1  CL 105  106 105 108 109  CO2 GLUCOSE 152* 120* 220* 185* 109*  BUN 32* 35* 47* 49* 45*  CREATININE 2.47* 2.84* 2.49* 1.81* 1.33*  CALCIUM 9.1 8.5* 8.2* 8.8* 9.1  MG  --  1.3* 2.7* 2.7* 2.6*  PHOS  --  5.0* 6.4* 4.4 2.6   Liver Function Tests: Recent Labs  Lab 02-07-20 1916 01/18/20 0341 01/19/20 0456 01/20/20 0630 01/21/20 0509  AST 39 29 16 13* 10*  ALT ALKPHOS 60 49 65 53 51  BILITOT 1.4* 1.2 0.7 0.7 0.7  PROT 6.0* 5.7* 6.1* 5.9* 5.8*  ALBUMIN 3.0* 2.7* 2.8* 2.7* 2.7*   Recent Labs  Lab 02-07-20 1916  LIPASE 30   No results for input(s): AMMONIA in the last 168 hours. CBC: Recent Labs  Lab 02/07/20 1916 01/18/20 1924 01/19/20 0456 01/20/20 0630 01/21/20 0509  WBC 10.6* 24.8* 20.7* 10.7* 8.0  NEUTROABS  --  21.4* 17.7* 9.9* 6.3  HGB 9.9* 8.9* 8.6* 7.9* 8.2*  HCT 34.7* 31.4* 30.2* 28.1* 28.4*  MCV 99.1 101.3* 99.3 100.7* 97.9  PLT 172 184 170 133* 120*   Cardiac Enzymes: No results for input(s): CKTOTAL, CKMB, CKMBINDEX, TROPONINI in the last 168 hours. Sepsis Labs: Recent Labs  Lab 02-07-2020 1916 02/07/20 1916 Feb 07, 2020 2143 01/18/20 0341 01/18/20 1924 01/19/20 0456 01/20/20 0630 01/21/20 0509  PROCALCITON 105.95  --   --   --   --   --   --   --  WBC 10.6*   < >  --   --  24.8* 20.7* 10.7* 8.0  LATICACIDVEN 5.6*  --  5.0* 3.8*  --  1.4  --   --    < > = values in this interval not displayed.       Cecille Mcclusky 01/23/2020, 5:26 PM

## 2020-02-05 NOTE — Progress Notes (Signed)
Spoke to [facility] in regards to who states that patient will not be going to hospice house as she is no longer considered stable for transfer.

## 2020-02-05 NOTE — TOC Progression Note (Signed)
Transition of Care [facility]) - Progression Note    Patient Details  Name: Jasmine Osborn MRN: [id number] Date of Birth: 1951-06-27  Transition of [facility]) CM/SW Contact  Eilleen Kempf, LCSW Phone Number: 02/01/2020, 10:15 AM  Clinical Narrative:    Patient's condition has declined over night. Family called in, [place] and SW notified Tracy with Hospice, patient actively dying and not able to be transferred   Expected Discharge Plan: Skilled Nursing Facility Barriers to Discharge: Continued Medical Work up  Expected Discharge Plan and Services Expected Discharge Plan: Skilled Nursing Facility       Living arrangements for the past 2 months: Skilled Nursing Facility, Single Family Home                                       Social Determinants of Health (SDOH) Interventions    Readmission Risk Interventions Readmission Risk Prevention Plan 01/18/2020  Transportation Screening Complete  Medication Review Oceanographer) Complete  PCP or Specialist appointment within 3-5 days of discharge Complete  HRI or Home Care Consult Complete  Skilled Nursing Facility Complete  Some recent data might be hidden

## 2020-02-05 NOTE — Progress Notes (Signed)
Patient noted able to rest through the night. Occasionally patient's respiratory rate would increase to the 40's. Routine breathing treatment administered by RT. HOB kept elevated. Safety maintained. Will continue to monitor and endorse.

## 2020-02-05 NOTE — Progress Notes (Addendum)
Attempted call to palliative care team for guidance on when/if to WD O2 as patient is unable to state when to remove. Unsuccessful with contact at [phone], unable to leave message.

## 2020-02-05 NOTE — Progress Notes (Signed)
Attempted to call patients husband Burman Freestone to update on patient status as unresponsive. Unable to contact him at this time and mailbox full. Will try again. Wanted to keep him informed of when O2 is titrated down per MD order but awaiting call back from family as patient unable to request removal at this time. Was placed on Venturi mask by [place] RN for WOB however patient wishes were to withdraw O2 per palliative care team "when she was ready". Per NOC RN she did not request to withdraw O2 and is now unresponsive, however did decline BIPAP overnight before losing responsiveness .

## 2020-02-05 NOTE — Progress Notes (Signed)
Order in place for morphine gtt. Message sent to pharmacy to expadite order so we can maintain comfort during withdrawal of venturi mask and placement of 2l [place] per MD req.

## 2020-02-05 NOTE — Progress Notes (Addendum)
PROGRESS NOTE    Jasmine Osborn   ASN:[id number]  DOB: [birth year]  DOA: 2020-01-19 PCP: Lauro Regulus, MD   Brief Narrative:  Jasmine Osborn  69 yo female with  COPD, morbid obesity, OHS,   HTN, Abdominal Hernia, Hyperlipidemia, Chronic Atrial Fibrillation,Type II Diabetes Mellitus, Depression, vit B12 deficiency, recent Left LE ulcer & recent vaginal bleeding. Admitted 3/29-4/5 for anemia, vaginal bleeding in setting of Eliquis, D&C done on 3/31 and IUD placed. Megace discontinued.  Now presents from SNF ([facility]) with SBP in 40s, temp 99,  lactic acid 5.6, WBC 24, Cr 2.47. Admitted to ICU for septic shock due to UTI and possibly pneumonia. 4/13> noted to have hematuria, Apixaban d/c'd  Of note, the patient is a poor historian.  Actively dying after being transitioned to comfort care based on the patient's request. Cont Morphine infusion. Titrate to comfort. Expecting in hospital death.     Assessment & Plan:   Principal Problem:   Septic shock - Acute lower UTI possible pneumonia - CXR > atelectasis or infiltrates at base, has had a cough which is improving per husband   - Vanc/Cefepime started on 4/12- transitioned to Ceftriaxone 4/15 - 4/18 would complete a 7 day course which I am expecting - lactic acidosis resolved - PCT 105.95 - WBC 24.8 > 10.7  - U cultue > 100K E coli- currently receiving Ceftriaxone which is it sensitive to - weaned off pressors &Solucortef    Active Problems: Vaginal bleeding - has blood on the sheets but not excessive so likely is oozing - 4/15>  have left message for Dr Dalbert Garnet who did her D & C a couple of weeks ago that she is bleeding and I would like a consult- no consult as of yet - Apixaban has been on hold - 4/16- less blood on sheets today- per RN, it seems to be like mild menses bleeding - patient unaware of the bleeding  Anemia -  possibly dilutional with underlying anemia of chronic disease - Hb 9.9 >>> 7.9 - 8.2  -   anemia panel      Ref. Range 01/21/2020 05:09  Iron Latest Ref Range: 28 - 170 ug/dL 38  UIBC Latest Units: ug/dL 193  TIBC Latest Ref Range: 250 - 450 ug/dL 790  Saturation Ratios Latest Ref Range: 10.4 - 31.8 % 11  Ferritin Latest Ref Range: 11 - 307 ng/mL 73  Folate Latest Ref Range: >5.9 ng/mL 17.3      Persistent atrial fibrillation  - 4/16 resumed Cardizem while watching BP - resume Toprol 50 mg today - Apixaban has been on hold due to the vaginal bleeding    Morbid obesity  Body mass index is 48.35 kg/m.    AKI (acute kidney injury)- CKD 3a - Cr 2.47 up from ~ 1.2 baseline- likely due to use of diuretic and above sepsis/ hypotension/ infection - now 1.33 - resume Demadex 20 mg today    Obesity hypoventilation syndrome (HCC)   COPD (chronic obstructive pulmonary disease)  - OSA? - on 3 L O2 at baseline which is what she is on currently - uses BiPAP at night    DM (diabetes mellitus), type 2  - hold Metformin - cont SSI - last A1c 5.0 on 01/19/20  Mild thrombocytopenia  - Plt 133> 120 - follow  Disposition:  hospice   Time spent in minutes: 35 DVT prophylaxis: SCDs Code Status: DNR Family Communication: husband Disposition Plan: actively dying on Morphine infusion- in hospital  death expected Consultants:   PCCM  Gyn Procedures:   Central line- IJ Antimicrobials:  Anti-infectives (From admission, onward)   Start     Dose/Rate Route Frequency Ordered Stop   01/20/20 1000  cefTRIAXone (ROCEPHIN) 2 g in sodium chloride 0.9 % 100 mL IVPB  Status:  Discontinued     2 g 200 mL/hr over 30 Minutes Intravenous Every 24 hours 01/19/20 1028 01/21/20 1126   01/19/20 1000  ceFEPIme (MAXIPIME) 2 g in sodium chloride 0.9 % 100 mL IVPB  Status:  Discontinued     2 g 200 mL/hr over 30 Minutes Intravenous Every 24 hours 01/18/20 1657 01/19/20 1028   01/18/20 1000  ceFEPIme (MAXIPIME) 2 g in sodium chloride 0.9 % 100 mL IVPB  Status:  Discontinued     2 g 200 mL/hr over  30 Minutes Intravenous Every 12 hours 01/19/20 2227 01/18/20 1657   19-Jan-2020 2227  vancomycin variable dose per unstable renal function (pharmacist dosing)  Status:  Discontinued      Does not apply See admin instructions 2020-01-19 2228 01/18/20 1101   19-Jan-2020 2045  vancomycin (VANCOCIN) IVPB 1000 mg/200 mL premix     1,000 mg 200 mL/hr over 60 Minutes Intravenous  Once January 19, 2020 2031 01/19/20 2249   19-Jan-2020 2030  vancomycin (VANCOCIN) IVPB 1000 mg/200 mL premix     1,000 mg 200 mL/hr over 60 Minutes Intravenous  Once Jan 19, 2020 2029 Jan 19, 2020 2249   01-19-2020 2030  ceFEPIme (MAXIPIME) 2 g in sodium chloride 0.9 % 100 mL IVPB     2 g 200 mL/hr over 30 Minutes Intravenous  Once 19-Jan-2020 2029 January 19, 2020 2144       Objective: Vitals:   01/28/2020 1258 01/30/2020 1409 01/07/2020 1445 01/23/2020 1500  BP:      Pulse: (!) 123 (!) 117 (!) 115 (!) 115  Resp: (!) 24 20 12 13   Temp:      TempSrc:      SpO2: 97% 96% 95% 95%  Weight:      Height:        Intake/Output Summary (Last 24 hours) at 01/28/2020 1711 Last data filed at 01/13/2020 1555 Gross per 24 hour  Intake 0.58 ml  Output 1850 ml  Net -1849.42 ml   Filed Weights   01/18/20 0400 01/19/20 0400 01/21/20 0400  Weight: 116.1 kg 120.5 kg 119.9 kg    Examination: General exam: Appears comfortable      Data Reviewed: I have personally reviewed following labs and imaging studies  CBC: Recent Labs  Lab 2020-01-19 1916 01/18/20 1924 01/19/20 0456 01/20/20 0630 01/21/20 0509  WBC 10.6* 24.8* 20.7* 10.7* 8.0  NEUTROABS  --  21.4* 17.7* 9.9* 6.3  HGB 9.9* 8.9* 8.6* 7.9* 8.2*  HCT 34.7* 31.4* 30.2* 28.1* 28.4*  MCV 99.1 101.3* 99.3 100.7* 97.9  PLT 172 184 170 133* 242*   Basic Metabolic Panel: Recent Labs  Lab 2020-01-19 1916 01/18/20 0341 01/19/20 0456 01/20/20 0630 01/21/20 0509  NA 141 143 141 142 146*  K 3.7 4.4 4.7 4.7 4.1  CL 105 106 105 108 109  CO2 23 26 28 27 30   GLUCOSE 152* 120* 220* 185* 109*  BUN 32* 35* 47*  49* 45*  CREATININE 2.47* 2.84* 2.49* 1.81* 1.33*  CALCIUM 9.1 8.5* 8.2* 8.8* 9.1  MG  --  1.3* 2.7* 2.7* 2.6*  PHOS  --  5.0* 6.4* 4.4 2.6   GFR: Estimated Creatinine Clearance: 49.2 mL/min (A) (by C-G formula based on SCr of 1.33  mg/dL (H)). Liver Function Tests: Recent Labs  Lab 01/09/2020 1916 01/18/20 0341 01/19/20 0456 01/20/20 0630 01/21/20 0509  AST 39 29 16 13* 10*  ALT 14 14 13 11 10   ALKPHOS 60 49 65 53 51  BILITOT 1.4* 1.2 0.7 0.7 0.7  PROT 6.0* 5.7* 6.1* 5.9* 5.8*  ALBUMIN 3.0* 2.7* 2.8* 2.7* 2.7*   Recent Labs  Lab 02/04/2020 1916  LIPASE 30   No results for input(s): AMMONIA in the last 168 hours. Coagulation Profile: Recent Labs  Lab 01/18/20 0341  INR 1.7*   Cardiac Enzymes: No results for input(s): CKTOTAL, CKMB, CKMBINDEX, TROPONINI in the last 168 hours. BNP (last 3 results) No results for input(s): PROBNP in the last 8760 hours. HbA1C: No results for input(s): HGBA1C in the last 72 hours. CBG: Recent Labs  Lab 01/20/20 1629 01/20/20 2013 01/21/20 0027 01/21/20 0353 01/21/20 0808  GLUCAP 183* 207* 190* 104* 90   Lipid Profile: No results for input(s): CHOL, HDL, LDLCALC, TRIG, CHOLHDL, LDLDIRECT in the last 72 hours. Thyroid Function Tests: No results for input(s): TSH, T4TOTAL, FREET4, T3FREE, THYROIDAB in the last 72 hours. Anemia Panel: Recent Labs    01/21/20 0509  VITAMINB12 1,037*  FOLATE 17.3  FERRITIN 73  TIBC 333  IRON 38  RETICCTPCT 3.4*   Urine analysis:    Component Value Date/Time   COLORURINE YELLOW (A) 01/07/2020 2143   APPEARANCEUR TURBID (A) 01/31/2020 2143   LABSPEC 1.008 02/04/2020 2143   PHURINE 6.0 01/21/2020 2143   GLUCOSEU NEGATIVE 01/18/2020 2143   HGBUR LARGE (A) 01/26/2020 2143   BILIRUBINUR NEGATIVE 01/20/2020 2143   KETONESUR NEGATIVE 01/26/2020 2143   PROTEINUR 100 (A) 01/31/2020 2143   NITRITE NEGATIVE 01/25/2020 2143   LEUKOCYTESUR LARGE (A) 01/24/2020 2143   Sepsis  Labs: @LABRCNTIP (procalcitonin:4,lacticidven:4) ) Recent Results (from the past 240 hour(s))  Blood Culture (routine x 2)     Status: None   Collection Time: 02/02/2020  7:16 PM   Specimen: BLOOD  Result Value Ref Range Status   Specimen Description BLOOD BLOOD RIGHT HAND  Final   Special Requests   Final    BOTTLES DRAWN AEROBIC AND ANAEROBIC Blood Culture adequate volume   Culture   Final    NO GROWTH 5 DAYS Performed at [facility], [address] [place], [place] [address]    Report Status Jul 25, 2020 FINAL  Final  Blood Culture (routine x 2)     Status: None   Collection Time: 01/06/2020  7:16 PM   Specimen: BLOOD  Result Value Ref Range Status   Specimen Description BLOOD BLOOD LEFT HAND  Final   Special Requests   Final    BOTTLES DRAWN AEROBIC AND ANAEROBIC Blood Culture results may not be optimal due to an excessive volume of blood received in culture bottles   Culture   Final    NO GROWTH 5 DAYS Performed at [facility], [address] [place], [place] [address]    Report Status Jul 25, 2020 FINAL  Final  SARS CORONAVIRUS 2 (TAT 6-24 HRS) Nasopharyngeal Nasopharyngeal Swab     Status: None   Collection Time: 02/04/2020  7:27 PM   Specimen: Nasopharyngeal Swab  Result Value Ref Range Status   SARS Coronavirus 2 NEGATIVE NEGATIVE Final    Comment: (NOTE) SARS-CoV-2 target nucleic acids are NOT DETECTED. The SARS-CoV-2 RNA is generally detectable in upper and lower respiratory specimens during the acute phase of infection. Negative results do not preclude SARS-CoV-2 infection, do not rule  out co-infections with other pathogens, and should not be used as the sole basis for treatment or other patient management decisions. Negative results must be combined with clinical observations, patient history, and epidemiological information. The expected result is Negative. Fact Sheet for Patients: HairSlick.no Fact Sheet for  Healthcare Providers: quierodirigir.com This test is not yet approved or cleared by the [place] FDA and  has been authorized for detection and/or diagnosis of SARS-CoV-2 by FDA under an Emergency Use Authorization (EUA). This EUA will remain  in effect (meaning this test can be used) for the duration of the COVID-19 declaration under Section 56 4(b)(1) of the Act, 21 U.S.C. section 360bbb-3(b)(1), unless the authorization is terminated or revoked sooner. Performed at [facility], [address] [place], [place] [address]   Urine Culture     Status: Abnormal   Collection Time: Jan 22, 2020  9:43 PM   Specimen: Urine, Random  Result Value Ref Range Status   Specimen Description   Final    URINE, RANDOM Performed at [facility], [address] [place], [place] [address]    Special Requests   Final    NONE Performed at [facility], [address] [place], [place] [address]    Culture >=100,000 COLONIES/mL ESCHERICHIA COLI (A)  Final   Report Status 01/20/2020 FINAL  Final   Organism ID, Bacteria ESCHERICHIA COLI (A)  Final      Susceptibility   Escherichia coli - MIC*    AMPICILLIN <=2 SENSITIVE Sensitive     CEFAZOLIN <=4 SENSITIVE Sensitive     CEFTRIAXONE <=0.25 SENSITIVE Sensitive     CIPROFLOXACIN >=4 RESISTANT Resistant     GENTAMICIN <=1 SENSITIVE Sensitive     IMIPENEM <=0.25 SENSITIVE Sensitive     NITROFURANTOIN <=16 SENSITIVE Sensitive     TRIMETH/SULFA <=20 SENSITIVE Sensitive     AMPICILLIN/SULBACTAM <=2 SENSITIVE Sensitive     PIP/TAZO <=4 SENSITIVE Sensitive     * >=100,000 COLONIES/mL ESCHERICHIA COLI  Respiratory Panel by RT PCR (Flu A&B, Covid) - Nasopharyngeal Swab     Status: None   Collection Time: 01/28/2020 10:50 PM   Specimen: Nasopharyngeal Swab  Result Value Ref Range Status   SARS Coronavirus 2 by RT PCR NEGATIVE NEGATIVE Final    Comment: (NOTE) SARS-CoV-2 target nucleic acids are NOT  DETECTED. The SARS-CoV-2 RNA is generally detectable in upper respiratoy specimens during the acute phase of infection. The lowest concentration of SARS-CoV-2 viral copies this assay can detect is 131 copies/mL. A negative result does not preclude SARS-Cov-2 infection and should not be used as the sole basis for treatment or other patient management decisions. A negative result may occur with  improper specimen collection/handling, submission of specimen other than nasopharyngeal swab, presence of viral mutation(s) within the areas targeted by this assay, and inadequate number of viral copies (<131 copies/mL). A negative result must be combined with clinical observations, patient history, and epidemiological information. The expected result is Negative. Fact Sheet for Patients:  https://www.moore.com/ Fact Sheet for Healthcare Providers:  https://www.young.biz/ This test is not yet ap proved or cleared by the [place] FDA and  has been authorized for detection and/or diagnosis of SARS-CoV-2 by FDA under an Emergency Use Authorization (EUA). This EUA will remain  in effect (meaning this test can be used) for the duration of the COVID-19 declaration under Section 564(b)(1) of the Act, 21 U.S.C. section 360bbb-3(b)(1), unless the authorization is terminated or revoked sooner.    Influenza A by  PCR NEGATIVE NEGATIVE Final   Influenza B by PCR NEGATIVE NEGATIVE Final    Comment: (NOTE) The Xpert Xpress SARS-CoV-2/FLU/RSV assay is intended as an aid in  the diagnosis of influenza from Nasopharyngeal swab specimens and  should not be used as a sole basis for treatment. Nasal washings and  aspirates are unacceptable for Xpert Xpress SARS-CoV-2/FLU/RSV  testing. Fact Sheet for Patients: https://www.moore.com/ Fact Sheet for Healthcare Providers: https://www.young.biz/ This test is not yet approved or cleared  by the [place] FDA and  has been authorized for detection and/or diagnosis of SARS-CoV-2 by  FDA under an Emergency Use Authorization (EUA). This EUA will remain  in effect (meaning this test can be used) for the duration of the  Covid-19 declaration under Section 564(b)(1) of the Act, 21  U.S.C. section 360bbb-3(b)(1), unless the authorization is  terminated or revoked. Performed at [facility], [address] [place], [place] [address]   MRSA PCR Screening     Status: None   Collection Time: 01/18/20  3:02 AM   Specimen: Nasopharyngeal  Result Value Ref Range Status   MRSA by PCR NEGATIVE NEGATIVE Final    Comment:        The GeneXpert MRSA Assay (FDA approved for NASAL specimens only), is one component of a comprehensive MRSA colonization surveillance program. It is not intended to diagnose MRSA infection nor to guide or monitor treatment for MRSA infections. Performed at [facility], [address] [place], [place] [address]   Expectorated sputum assessment w rflx to resp cult     Status: None   Collection Time: 01/18/20  1:31 PM   Specimen: Sputum  Result Value Ref Range Status   Specimen Description SPUTUM  Final   Special Requests NONE  Final   Sputum evaluation   Final    Sputum specimen not acceptable for testing.  Please recollect.   SPOKE TO Ladell Heads RN AT 1435 ON 01/18/20 [facility]  Performed at [facility], [address], [place], [place] [address]    Report Status 01/18/2020 FINAL  Final         Radiology Studies: No results found.    Scheduled Meds: . metoprolol succinate  50 mg Oral Daily   Continuous Infusions: . lactated ringers    . morphine 4 mg/hr (01/14/2020 1212)     LOS: 5 days      Calvert Cantor, MD [facility] Pager: [email or web address] 01/09/2020, 5:11 PM

## 2020-02-05 NOTE — Progress Notes (Addendum)
At 1805 Pt pronounced dead by RN, this Clinical research associate. Respirations zero. Apical pulse rate zero. Second Probation officer, Charity fundraiser.

## 2020-02-05 NOTE — Progress Notes (Signed)
Referral number for [facility] #[id number] spoke to [facility], at [facility]. Noralee Chars states that patient is suitable candidate for donating eye and skin tissue. Family informed to expect call.

## 2020-02-05 NOTE — Progress Notes (Signed)
   01/23/2020 1050  Clinical Encounter Type  Visited With Patient and family together  Visit Type Follow-up  Referral From Nurse  Consult/Referral To Chaplain  Spiritual Encounters  Spiritual Needs Emotional;Grief support  Stress Factors  Patient Stress Factors Health changes;Loss;Major life changes  Family Stress Factors Loss;Major life changes  CH was paged to room due to PT actively dying. Nursing staff thought it was be a good idea to be at bedside with family for comfort and support.

## 2020-02-05 DEATH — deceased
